# Patient Record
Sex: Female | Born: 1970 | Race: Black or African American | Hispanic: No | Marital: Single | Smoking: Never smoker
Health system: Southern US, Community
[De-identification: ages and names within clinical notes are randomized; demographics above are authoritative.]

## PROBLEM LIST (undated history)

## (undated) DIAGNOSIS — N179 Acute kidney failure, unspecified: Secondary | ICD-10-CM

## (undated) DIAGNOSIS — T7840XA Allergy, unspecified, initial encounter: Secondary | ICD-10-CM

## (undated) DIAGNOSIS — F329 Major depressive disorder, single episode, unspecified: Secondary | ICD-10-CM

## (undated) DIAGNOSIS — F32A Depression, unspecified: Secondary | ICD-10-CM

## (undated) HISTORY — PX: TUBAL LIGATION: SHX77

## (undated) HISTORY — DX: Allergy, unspecified, initial encounter: T78.40XA

## (undated) HISTORY — DX: Depression, unspecified: F32.A

## (undated) HISTORY — DX: Major depressive disorder, single episode, unspecified: F32.9

---

## 1997-06-21 ENCOUNTER — Other Ambulatory Visit: Admission: RE | Admit: 1997-06-21 | Discharge: 1997-06-21 | Payer: Self-pay | Admitting: Obstetrics

## 1997-06-24 ENCOUNTER — Ambulatory Visit (HOSPITAL_COMMUNITY): Admission: RE | Admit: 1997-06-24 | Discharge: 1997-06-24 | Payer: Self-pay | Admitting: Obstetrics

## 1997-08-02 ENCOUNTER — Inpatient Hospital Stay (HOSPITAL_COMMUNITY): Admission: AD | Admit: 1997-08-02 | Discharge: 1997-08-05 | Payer: Self-pay | Admitting: Obstetrics

## 1997-08-07 ENCOUNTER — Inpatient Hospital Stay (HOSPITAL_COMMUNITY): Admission: AD | Admit: 1997-08-07 | Discharge: 1997-08-07 | Payer: Self-pay | Admitting: Obstetrics

## 1997-10-15 ENCOUNTER — Emergency Department (HOSPITAL_COMMUNITY): Admission: EM | Admit: 1997-10-15 | Discharge: 1997-10-15 | Payer: Self-pay | Admitting: Emergency Medicine

## 1997-10-15 ENCOUNTER — Encounter: Payer: Self-pay | Admitting: Emergency Medicine

## 1997-11-21 ENCOUNTER — Emergency Department (HOSPITAL_COMMUNITY): Admission: EM | Admit: 1997-11-21 | Discharge: 1997-11-21 | Payer: Self-pay | Admitting: *Deleted

## 1998-11-02 ENCOUNTER — Emergency Department (HOSPITAL_COMMUNITY): Admission: EM | Admit: 1998-11-02 | Discharge: 1998-11-02 | Payer: Self-pay | Admitting: Emergency Medicine

## 2000-12-19 ENCOUNTER — Emergency Department (HOSPITAL_COMMUNITY): Admission: EM | Admit: 2000-12-19 | Discharge: 2000-12-19 | Payer: Self-pay | Admitting: Emergency Medicine

## 2001-02-03 ENCOUNTER — Other Ambulatory Visit: Admission: RE | Admit: 2001-02-03 | Discharge: 2001-02-03 | Payer: Self-pay | Admitting: Obstetrics and Gynecology

## 2001-05-01 ENCOUNTER — Emergency Department (HOSPITAL_COMMUNITY): Admission: EM | Admit: 2001-05-01 | Discharge: 2001-05-01 | Payer: Self-pay | Admitting: Emergency Medicine

## 2001-05-03 ENCOUNTER — Emergency Department (HOSPITAL_COMMUNITY): Admission: EM | Admit: 2001-05-03 | Discharge: 2001-05-03 | Payer: Self-pay | Admitting: Emergency Medicine

## 2002-01-06 ENCOUNTER — Inpatient Hospital Stay (HOSPITAL_COMMUNITY): Admission: EM | Admit: 2002-01-06 | Discharge: 2002-01-09 | Payer: Self-pay | Admitting: Emergency Medicine

## 2002-01-06 ENCOUNTER — Emergency Department (HOSPITAL_COMMUNITY): Admission: EM | Admit: 2002-01-06 | Discharge: 2002-01-06 | Payer: Self-pay | Admitting: Emergency Medicine

## 2002-06-08 ENCOUNTER — Emergency Department (HOSPITAL_COMMUNITY): Admission: EM | Admit: 2002-06-08 | Discharge: 2002-06-08 | Payer: Self-pay | Admitting: *Deleted

## 2002-10-15 ENCOUNTER — Other Ambulatory Visit: Admission: RE | Admit: 2002-10-15 | Discharge: 2002-10-15 | Payer: Self-pay | Admitting: Obstetrics and Gynecology

## 2003-01-16 ENCOUNTER — Emergency Department (HOSPITAL_COMMUNITY): Admission: EM | Admit: 2003-01-16 | Discharge: 2003-01-16 | Payer: Self-pay | Admitting: Emergency Medicine

## 2003-05-04 ENCOUNTER — Emergency Department (HOSPITAL_COMMUNITY): Admission: EM | Admit: 2003-05-04 | Discharge: 2003-05-04 | Payer: Self-pay | Admitting: Emergency Medicine

## 2003-05-26 ENCOUNTER — Emergency Department (HOSPITAL_COMMUNITY): Admission: EM | Admit: 2003-05-26 | Discharge: 2003-05-26 | Payer: Self-pay | Admitting: Emergency Medicine

## 2003-05-29 ENCOUNTER — Emergency Department (HOSPITAL_COMMUNITY): Admission: EM | Admit: 2003-05-29 | Discharge: 2003-05-29 | Payer: Self-pay | Admitting: Emergency Medicine

## 2003-10-11 ENCOUNTER — Emergency Department (HOSPITAL_COMMUNITY): Admission: EM | Admit: 2003-10-11 | Discharge: 2003-10-11 | Payer: Self-pay | Admitting: Emergency Medicine

## 2004-04-19 ENCOUNTER — Emergency Department (HOSPITAL_COMMUNITY): Admission: EM | Admit: 2004-04-19 | Discharge: 2004-04-19 | Payer: Self-pay | Admitting: Emergency Medicine

## 2004-09-29 ENCOUNTER — Emergency Department (HOSPITAL_COMMUNITY): Admission: EM | Admit: 2004-09-29 | Discharge: 2004-09-29 | Payer: Self-pay | Admitting: Emergency Medicine

## 2004-11-17 ENCOUNTER — Emergency Department (HOSPITAL_COMMUNITY): Admission: EM | Admit: 2004-11-17 | Discharge: 2004-11-17 | Payer: Self-pay | Admitting: Emergency Medicine

## 2005-09-07 ENCOUNTER — Emergency Department (HOSPITAL_COMMUNITY): Admission: EM | Admit: 2005-09-07 | Discharge: 2005-09-07 | Payer: Self-pay | Admitting: Emergency Medicine

## 2005-09-20 ENCOUNTER — Emergency Department (HOSPITAL_COMMUNITY): Admission: EM | Admit: 2005-09-20 | Discharge: 2005-09-20 | Payer: Self-pay | Admitting: Family Medicine

## 2006-01-03 ENCOUNTER — Inpatient Hospital Stay (HOSPITAL_COMMUNITY): Admission: EM | Admit: 2006-01-03 | Discharge: 2006-01-05 | Payer: Self-pay | Admitting: Emergency Medicine

## 2006-09-12 ENCOUNTER — Emergency Department (HOSPITAL_COMMUNITY): Admission: EM | Admit: 2006-09-12 | Discharge: 2006-09-12 | Payer: Self-pay | Admitting: Emergency Medicine

## 2007-02-20 ENCOUNTER — Emergency Department (HOSPITAL_COMMUNITY): Admission: EM | Admit: 2007-02-20 | Discharge: 2007-02-20 | Payer: Self-pay | Admitting: Emergency Medicine

## 2007-02-22 ENCOUNTER — Emergency Department (HOSPITAL_COMMUNITY): Admission: EM | Admit: 2007-02-22 | Discharge: 2007-02-22 | Payer: Self-pay | Admitting: Emergency Medicine

## 2007-02-22 ENCOUNTER — Inpatient Hospital Stay (HOSPITAL_COMMUNITY): Admission: AD | Admit: 2007-02-22 | Discharge: 2007-02-26 | Payer: Self-pay | Admitting: Psychiatry

## 2007-02-24 ENCOUNTER — Ambulatory Visit: Payer: Self-pay | Admitting: Psychiatry

## 2007-03-18 ENCOUNTER — Emergency Department (HOSPITAL_COMMUNITY): Admission: EM | Admit: 2007-03-18 | Discharge: 2007-03-18 | Payer: Self-pay | Admitting: Family Medicine

## 2007-06-14 ENCOUNTER — Emergency Department (HOSPITAL_COMMUNITY): Admission: EM | Admit: 2007-06-14 | Discharge: 2007-06-14 | Payer: Self-pay | Admitting: Emergency Medicine

## 2007-08-17 ENCOUNTER — Emergency Department (HOSPITAL_COMMUNITY): Admission: EM | Admit: 2007-08-17 | Discharge: 2007-08-17 | Payer: Self-pay | Admitting: Emergency Medicine

## 2007-08-20 ENCOUNTER — Emergency Department (HOSPITAL_COMMUNITY): Admission: EM | Admit: 2007-08-20 | Discharge: 2007-08-20 | Payer: Self-pay | Admitting: Emergency Medicine

## 2007-11-05 ENCOUNTER — Emergency Department (HOSPITAL_COMMUNITY): Admission: EM | Admit: 2007-11-05 | Discharge: 2007-11-05 | Payer: Self-pay | Admitting: Emergency Medicine

## 2008-01-13 ENCOUNTER — Ambulatory Visit: Payer: Self-pay | Admitting: Obstetrics and Gynecology

## 2008-01-13 ENCOUNTER — Encounter: Payer: Self-pay | Admitting: Obstetrics and Gynecology

## 2008-01-13 LAB — CONVERTED CEMR LAB: Trich, Wet Prep: NONE SEEN

## 2008-08-08 ENCOUNTER — Emergency Department (HOSPITAL_COMMUNITY): Admission: EM | Admit: 2008-08-08 | Discharge: 2008-08-08 | Payer: Self-pay | Admitting: Emergency Medicine

## 2008-09-20 ENCOUNTER — Emergency Department (HOSPITAL_COMMUNITY): Admission: EM | Admit: 2008-09-20 | Discharge: 2008-09-20 | Payer: Self-pay | Admitting: Family Medicine

## 2008-11-14 ENCOUNTER — Emergency Department (HOSPITAL_COMMUNITY): Admission: EM | Admit: 2008-11-14 | Discharge: 2008-11-14 | Payer: Self-pay | Admitting: Emergency Medicine

## 2009-03-09 ENCOUNTER — Emergency Department (HOSPITAL_COMMUNITY): Admission: EM | Admit: 2009-03-09 | Discharge: 2009-03-09 | Payer: Self-pay | Admitting: Family Medicine

## 2009-07-26 ENCOUNTER — Observation Stay (HOSPITAL_COMMUNITY): Admission: EM | Admit: 2009-07-26 | Discharge: 2009-07-26 | Payer: Self-pay | Admitting: Emergency Medicine

## 2009-11-09 ENCOUNTER — Inpatient Hospital Stay (HOSPITAL_COMMUNITY): Admission: EM | Admit: 2009-11-09 | Discharge: 2009-11-12 | Payer: Self-pay | Admitting: Emergency Medicine

## 2009-11-10 ENCOUNTER — Ambulatory Visit: Payer: Self-pay | Admitting: Infectious Diseases

## 2009-11-22 ENCOUNTER — Emergency Department (HOSPITAL_COMMUNITY): Admission: EM | Admit: 2009-11-22 | Discharge: 2009-11-22 | Payer: Self-pay | Admitting: Emergency Medicine

## 2009-12-01 ENCOUNTER — Encounter (INDEPENDENT_AMBULATORY_CARE_PROVIDER_SITE_OTHER): Payer: Self-pay | Admitting: *Deleted

## 2009-12-01 LAB — CONVERTED CEMR LAB
AST: 44 units/L — ABNORMAL HIGH (ref 0–37)
Albumin: 3.7 g/dL (ref 3.5–5.2)
BUN: 9 mg/dL (ref 6–23)
Calcium: 8.9 mg/dL (ref 8.4–10.5)
Creatinine, Ser: 0.55 mg/dL (ref 0.40–1.20)
Glucose, Bld: 140 mg/dL — ABNORMAL HIGH (ref 70–99)
Sodium: 141 meq/L (ref 135–145)

## 2010-03-11 ENCOUNTER — Emergency Department (HOSPITAL_COMMUNITY)
Admission: EM | Admit: 2010-03-11 | Discharge: 2010-03-11 | Disposition: A | Discharge status: Home / Self Care | Payer: Self-pay | Attending: Emergency Medicine | Admitting: Emergency Medicine

## 2010-03-11 DIAGNOSIS — E119 Type 2 diabetes mellitus without complications: Secondary | ICD-10-CM | POA: Insufficient documentation

## 2010-03-11 DIAGNOSIS — R040 Epistaxis: Secondary | ICD-10-CM | POA: Insufficient documentation

## 2010-03-11 DIAGNOSIS — Z794 Long term (current) use of insulin: Secondary | ICD-10-CM | POA: Insufficient documentation

## 2010-04-19 LAB — DIFFERENTIAL
Basophils Relative: 0 % (ref 0–1)
Eosinophils Absolute: 0 10*3/uL (ref 0.0–0.7)
Eosinophils Relative: 1 % (ref 0–5)
Eosinophils Relative: 1 % (ref 0–5)
Lymphocytes Relative: 20 % (ref 12–46)
Lymphocytes Relative: 21 % (ref 12–46)
Lymphs Abs: 1.2 10*3/uL (ref 0.7–4.0)
Lymphs Abs: 1.3 10*3/uL (ref 0.7–4.0)
Lymphs Abs: 1.4 10*3/uL (ref 0.7–4.0)
Lymphs Abs: 1.6 10*3/uL (ref 0.7–4.0)
Monocytes Absolute: 0.6 10*3/uL (ref 0.1–1.0)
Monocytes Relative: 8 % (ref 3–12)
Monocytes Relative: 9 % (ref 3–12)
Neutro Abs: 4 10*3/uL (ref 1.7–7.7)
Neutrophils Relative %: 69 % (ref 43–77)
Neutrophils Relative %: 71 % (ref 43–77)

## 2010-04-19 LAB — CBC
HCT: 33.3 % — ABNORMAL LOW (ref 36.0–46.0)
Hemoglobin: 11.3 g/dL — ABNORMAL LOW (ref 12.0–15.0)
Hemoglobin: 13.9 g/dL (ref 12.0–15.0)
MCH: 29.9 pg (ref 26.0–34.0)
MCH: 30.3 pg (ref 26.0–34.0)
MCHC: 34.3 g/dL (ref 30.0–36.0)
MCHC: 34.3 g/dL (ref 30.0–36.0)
MCHC: 34.7 g/dL (ref 30.0–36.0)
MCV: 87.5 fL (ref 78.0–100.0)
Platelets: 197 10*3/uL (ref 150–400)
Platelets: 240 10*3/uL (ref 150–400)
RBC: 3.91 MIL/uL (ref 3.87–5.11)
RBC: 4.59 MIL/uL (ref 3.87–5.11)
RDW: 12.6 % (ref 11.5–15.5)
RDW: 12.7 % (ref 11.5–15.5)
WBC: 10.3 10*3/uL (ref 4.0–10.5)

## 2010-04-19 LAB — GLUCOSE, CAPILLARY
Glucose-Capillary: 169 mg/dL — ABNORMAL HIGH (ref 70–99)
Glucose-Capillary: 198 mg/dL — ABNORMAL HIGH (ref 70–99)
Glucose-Capillary: 216 mg/dL — ABNORMAL HIGH (ref 70–99)
Glucose-Capillary: 218 mg/dL — ABNORMAL HIGH (ref 70–99)
Glucose-Capillary: 226 mg/dL — ABNORMAL HIGH (ref 70–99)
Glucose-Capillary: 228 mg/dL — ABNORMAL HIGH (ref 70–99)
Glucose-Capillary: 255 mg/dL — ABNORMAL HIGH (ref 70–99)
Glucose-Capillary: 278 mg/dL — ABNORMAL HIGH (ref 70–99)

## 2010-04-19 LAB — BASIC METABOLIC PANEL
Chloride: 99 mEq/L (ref 96–112)
Creatinine, Ser: 0.45 mg/dL (ref 0.4–1.2)
GFR calc non Af Amer: >60 mL/min (ref >60)
Glucose, Bld: 267 mg/dL — ABNORMAL HIGH (ref 70–99)
Sodium: 132 mEq/L — ABNORMAL LOW (ref 135–145)

## 2010-04-19 LAB — COMPREHENSIVE METABOLIC PANEL
ALT: 28 U/L (ref 0–35)
AST: 41 U/L — ABNORMAL HIGH (ref 0–37)
AST: 42 U/L — ABNORMAL HIGH (ref 0–37)
Albumin: 2.6 g/dL — ABNORMAL LOW (ref 3.5–5.2)
Alkaline Phosphatase: 239 U/L — ABNORMAL HIGH (ref 39–117)
BUN: 6 mg/dL (ref 6–23)
CO2: 25 mEq/L (ref 19–32)
Calcium: 8.5 mg/dL (ref 8.4–10.5)
Calcium: 8.6 mg/dL (ref 8.4–10.5)
Calcium: 8.6 mg/dL (ref 8.4–10.5)
Creatinine, Ser: 0.49 mg/dL (ref 0.4–1.2)
Creatinine, Ser: 0.54 mg/dL (ref 0.4–1.2)
GFR calc Af Amer: >60 mL/min (ref >60)
GFR calc Af Amer: >60 mL/min (ref >60)
GFR calc non Af Amer: >60 mL/min (ref >60)
GFR calc non Af Amer: >60 mL/min (ref >60)
Glucose, Bld: 198 mg/dL — ABNORMAL HIGH (ref 70–99)
Glucose, Bld: 218 mg/dL — ABNORMAL HIGH (ref 70–99)
Sodium: 134 mEq/L — ABNORMAL LOW (ref 135–145)
Total Protein: 6.4 g/dL (ref 6.0–8.3)

## 2010-04-19 LAB — CULTURE, BLOOD (ROUTINE X 2)
Culture  Setup Time: 201110061406
Culture  Setup Time: 201110061406
Culture: NO GROWTH

## 2010-04-19 LAB — WOUND CULTURE

## 2010-04-19 LAB — URINE MICROSCOPIC-ADD ON

## 2010-04-19 LAB — URINALYSIS, ROUTINE W REFLEX MICROSCOPIC
Bilirubin Urine: NEGATIVE
Nitrite: NEGATIVE
Protein, ur: NEGATIVE mg/dL
Specific Gravity, Urine: 1.035 — ABNORMAL HIGH (ref 1.005–1.030)
Urobilinogen, UA: 2 mg/dL — ABNORMAL HIGH (ref 0.0–1.0)
pH: 6.5 (ref 5.0–8.0)

## 2010-04-19 LAB — PHOSPHORUS
Phosphorus: 3.4 mg/dL (ref 2.3–4.6)
Phosphorus: 3.9 mg/dL (ref 2.3–4.6)

## 2010-04-19 LAB — MAGNESIUM
Magnesium: 1.7 mg/dL (ref 1.5–2.5)
Magnesium: 1.8 mg/dL (ref 1.5–2.5)

## 2010-04-22 LAB — POCT I-STAT 3, VENOUS BLOOD GAS (G3P V)
Acid-Base Excess: 1 mmol/L (ref 0.0–2.0)
Bicarbonate: 25.6 mEq/L — ABNORMAL HIGH (ref 20.0–24.0)
pH, Ven: 7.405 — ABNORMAL HIGH (ref 7.250–7.300)
pO2, Ven: 69 mmHg — ABNORMAL HIGH (ref 30.0–45.0)

## 2010-04-22 LAB — DIFFERENTIAL
Basophils Absolute: 0 10*3/uL (ref 0.0–0.1)
Basophils Relative: 1 % (ref 0–1)
Lymphocytes Relative: 33 % (ref 12–46)
Monocytes Absolute: 0.3 10*3/uL (ref 0.1–1.0)
Neutro Abs: 2.7 10*3/uL (ref 1.7–7.7)

## 2010-04-22 LAB — URINE MICROSCOPIC-ADD ON

## 2010-04-22 LAB — CBC
MCHC: 34.2 g/dL (ref 30.0–36.0)
Platelets: 210 10*3/uL (ref 150–400)
RDW: 12.8 % (ref 11.5–15.5)
WBC: 4.8 10*3/uL (ref 4.0–10.5)

## 2010-04-22 LAB — BASIC METABOLIC PANEL
BUN: 5 mg/dL — ABNORMAL LOW (ref 6–23)
Calcium: 8.9 mg/dL (ref 8.4–10.5)
Creatinine, Ser: 0.48 mg/dL (ref 0.4–1.2)
GFR calc non Af Amer: >60 mL/min (ref >60)
Glucose, Bld: 480 mg/dL — ABNORMAL HIGH (ref 70–99)

## 2010-04-22 LAB — URINALYSIS, ROUTINE W REFLEX MICROSCOPIC
Glucose, UA: >1000 mg/dL — AB
Hgb urine dipstick: NEGATIVE
Leukocytes, UA: NEGATIVE
Protein, ur: NEGATIVE mg/dL
Specific Gravity, Urine: 1.01 (ref 1.005–1.030)
pH: 6 (ref 5.0–8.0)

## 2010-04-22 LAB — POCT I-STAT, CHEM 8
BUN: 3 mg/dL — ABNORMAL LOW (ref 6–23)
Creatinine, Ser: 0.5 mg/dL (ref 0.4–1.2)
Hemoglobin: 12.6 g/dL (ref 12.0–15.0)
Potassium: 3.6 mEq/L (ref 3.5–5.1)
Sodium: 135 mEq/L (ref 135–145)
TCO2: 24 mmol/L (ref 0–100)

## 2010-04-22 LAB — GLUCOSE, CAPILLARY
Glucose-Capillary: 199 mg/dL — ABNORMAL HIGH (ref 70–99)
Glucose-Capillary: 276 mg/dL — ABNORMAL HIGH (ref 70–99)
Glucose-Capillary: 340 mg/dL — ABNORMAL HIGH (ref 70–99)

## 2010-04-22 LAB — KETONES, QUALITATIVE: Acetone, Bld: NEGATIVE

## 2010-05-10 LAB — DIFFERENTIAL
Basophils Absolute: 0 10*3/uL (ref 0.0–0.1)
Basophils Relative: 0 % (ref 0–1)
Eosinophils Absolute: 0.1 10*3/uL (ref 0.0–0.7)
Monocytes Relative: 6 % (ref 3–12)
Neutro Abs: 3.9 10*3/uL (ref 1.7–7.7)
Neutrophils Relative %: 64 % (ref 43–77)

## 2010-05-10 LAB — CBC
MCHC: 35 g/dL (ref 30.0–36.0)
MCV: 86.8 fL (ref 78.0–100.0)
Platelets: 237 10*3/uL (ref 150–400)
RDW: 13.2 % (ref 11.5–15.5)

## 2010-05-10 LAB — BASIC METABOLIC PANEL
BUN: 11 mg/dL (ref 6–23)
CO2: 26 mEq/L (ref 19–32)
Calcium: 8.9 mg/dL (ref 8.4–10.5)
Chloride: 100 mEq/L (ref 96–112)
Creatinine, Ser: 0.61 mg/dL (ref 0.4–1.2)
GFR calc Af Amer: >60 mL/min (ref >60)

## 2010-05-10 LAB — GLUCOSE, CAPILLARY
Glucose-Capillary: 198 mg/dL — ABNORMAL HIGH (ref 70–99)
Glucose-Capillary: 415 mg/dL — ABNORMAL HIGH (ref 70–99)

## 2010-05-12 LAB — GLUCOSE, CAPILLARY
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
## 2010-06-19 NOTE — Group Therapy Note (Signed)
NAME:  [name], [name] NO.:  [id number]   MEDICAL RECORD NO.:  [id number]          PATIENT TYPE:  WOC   LOCATION:  [facility]                   FACILITY:  [facility]   PHYSICIAN:  Argentina Donovan, MD        DATE OF BIRTH:  05-06-1970   DATE OF SERVICE:                                  CLINIC NOTE   The patient is a 40 year old African American female gravida 6, para 3-0-  3-3, the youngest child 66 years old, has not been sexually active in 6  years, is an insulin-dependent diabetic and has been cared for by a  family doctor who she has an appointment with this next week.  The  patient is 5 feet 6 inches tall.  She weighs 339 pounds.  Her blood  pressure 133/80.  Pulse 74 per minute.  In for annual examination and  has no significant complaints except infrequent yeast infections.   PHYSICAL EXAMINATION:  Thyroid is symmetrical.  No dominant masses.  NECK:  Supple.  BREASTS:  Are extraordinarily large but smooth and no dominant masses  noted.  No nipple discharge.  ABDOMEN:  Soft, flat, nontender.  No  masses or organomegaly.  EXTERNAL GENITALIA:  Is normal.  BUS within normal limits.  Vagina is  clean and well-rugated.  The cervix clean and parous and Pap smear and  wet prep were taken.  The uterus and adnexa could not be palpated  because of habitus of the patient.   IMPRESSION:  Normal gynecological examination.           ______________________________  Argentina Donovan, MD     PR/MEDQ  D:  01/13/2008  T:  01/13/2008  Job:  [id number]

## 2010-06-22 NOTE — Discharge Summary (Signed)
[name], [name]            ACCOUNT NO.:  [id number]   MEDICAL RECORD NO.:  [id number]          PATIENT TYPE:  INP   LOCATION:  5503                         FACILITY:  [facility]   PHYSICIAN:  Hillery Aldo, M.D.   DATE OF BIRTH:  1970/12/05   DATE OF ADMISSION:  01/03/2006  DATE OF DISCHARGE:  01/05/2006                               DISCHARGE SUMMARY   PRIMARY CARE PHYSICIAN:  Dr. Lovell Sheehan.   DISCHARGE DIAGNOSES:  1. Noncardiac chest pain.  2. Uncontrolled diabetes.  3. Leukopenia.  4. Depression.  5. Gastroesophageal reflux disease.   DISCHARGE MEDICATIONS:  1. Aspirin 81 mg daily.  2. Lexapro 10 mg daily.  3. Protonix 40 mg daily.  4. Lantus insulin 20 units q.12 h.  5. NovoLog insulin 6 units with every meal along with sliding scale      insulin as directed.   CONSULTATIONS:  None.   PROCEDURES AND DIAGNOSTIC STUDIES:  1. Chest x-ray, on January 03, 2006, showed low-volume exam with      vascular congestion and bibasilar atelectasis.  There is mild      stable cardiac enlargement.  No acute air space process.  2. CT scan of the head on January 03, 2006, showed no acute      intracranial abnormalities.   DISCHARGE LABORATORY VALUES:  Sodium was 139, potassium 3.6, chloride  108, bicarb 27, BUN 8, creatinine 0.6, glucose 132.  White blood cell  count was 4.1, hemoglobin 12.2, hematocrit 35.7, platelets 202.   HOSPITAL COURSE BY PROBLEM:  1. Noncardiac chest pain.  The patient was initially admitted with      complaints of chest pain.  Cardiac enzymes were cycled and were      negative x3 sets.  The patient is a menstruating young female      making the possibility of coronary disease extremely unlikely.  Her      chest pain was therefore thought to be secondary to      gastroesophageal reflux disease and she was put on proton pump      inhibitor therapy.  Incidentally, her pain did get relieved when      given a GI cocktail.  No further diagnostic workup was  deemed      necessary.  She was monitored on telemetry and her 12-lead EKG was      unremarkable.  2. Uncontrolled diabetes.  The patient was put on high dose insulin      therapy.  Additionally, she was set up for outpatient diabetes      education.  Her hemoglobin A1c was found to be 12.3%.  She should      follow up with her primary care physician for ongoing diabetes      surveillance.  3. Leukopenia.  The patient's leukopenia was mild and corrected itself      without any specific intervention or workup.  4. Depression.  The patient was maintained on her usual dose of      Lexapro.   DISPOSITION:  The patient was stable for discharge home and was set up  for outpatient diabetes education prior  to discharge.      Hillery Aldo, M.D.  Electronically Signed     CR/MEDQ  D:  02/11/2006  T:  02/11/2006  Job:  [id number]   cc:   Lovell Sheehan, Dr.

## 2010-06-22 NOTE — H&P (Signed)
NAMEYSELA, HETTINGER NO.:  [id number]   MEDICAL RECORD NO.:  [id number]          PATIENT TYPE:  INP   LOCATION:  1826                         FACILITY:  [facility]   PHYSICIAN:  Marcellus Scott, MD     DATE OF BIRTH:  [birth year]   DATE OF ADMISSION:  01/03/2006  DATE OF DISCHARGE:                              HISTORY & PHYSICAL   PRIMARY CARE PHYSICIAN:  Della Goo, M.D.   CHIEF COMPLAINT:  Headache, chest pain, generalized weakness and  dyspepsia.   HISTORY OF PRESENT ILLNESS:  Ms. Sidman is a pleasant 40 year old  African-American female patient with history of diabetes that was  diagnosed approximately 4 years ago. She was initially on insulin, when  she was said to be well controlled, following which she was switched to  Avandia and Metformin. However, patient kept forgetting to take the  medications. She was subsequently switched back to Lantus insulin 10  units twice daily and a sliding scale NovoLog insulin in April 07, and  the patient is still on the same medications at the same dose. The  patient says her glycemic control was pretty okay until October 2007,  with sugars in the range of 120 mg/dL on Glucometer testing at home  which she does 4 times a day. However, since November, when she traveled  to [place], for her birthday,for 2 weeks and returned, they have been  uncontrolled. She also claims that she has not been compliant with her  diet, and since her return, she has had blood sugars in the range of  anywhere between 450 to 550 range. She still continues with Lantus  insulin at the same dose and claims she uses approximately a total of 7  to 8 units of NovoLog insulin for the day. She has history of polyuria,  polydipsia. There is no pruritus or visual disturbance. Last night, she  had some epigastric burning pain which radiated to her retrosternal area  about 3/10 in severity for which she took a dose of Maalox with prompt  relief of  the pain. This morning, the patient woke up with a headache  and feeling generally weak. Also had similar complaint of pain, 3/10,  retrosternal, burning in nature with no radiation, no relation to  position with no associated palpitations or dizziness or lightheadedness  or diaphoresis. The pain is apparently worse after meals. The patient  also had some dyspnea. Denied any cough, wheezing, chest tightness or  palpitations following which she presented to the emergency room.   PAST MEDICAL HISTORY:  1. Type 2 diabetes.  2. Morbid obesity.   The patient denies history of hypertension, dyslipidemia, MI or coronary  artery disease, COPD or asthma.   PAST SURGICAL HISTORY:  Status post tubal ligation.   MEDICATIONS:  1. Lantus insulin 10 units subcutaneously twice daily.  2. NovoLog sliding scale insulin.  3. Lexapro 10 mg p.o. every night.   FAMILY HISTORY:  The patient's mom demised at the age of 38 years of  brain tumor perioperatively, when she sustained a stroke. Her daughter  aged 20 years has juvenile diabetes for the  last 10 years.   SOCIAL HISTORY:  The patient is a nonsmoker. No alcohol intake or drug  abuse. She works as a Lawyer at [employer]. She had her last  menstrual period about two weeks ago, and she is not sexually active.   REVIEW OF SYSTEMS:  HEENT:  The patient has no further headaches at this  time. She denies any visual disturbances, earaches, sore throat,  difficulty in swallowing, tinnitus, lightheadedness or dizziness.  RESPIRATORY SYSTEM:  The patient denies any further dyspnea, wheezing,  tightness of the chest or cough. CARDIOVASCULAR SYSTEM:  The patient  denies any further chest pain since this morning. No palpitations. No  orthopnea. No PND. ABDOMINAL SYSTEM:  The patient denies nausea,  vomiting, constipation, diarrhea or abdominal pain. GENITOURINARY:  Polyuria but without any urgency, dysuria. MUSCULOSKELETAL:  Occasional  aches and  pains but no joint swelling or joint pains at this time. No  muscle aches. SKIN:  No rashes. CENTRAL NERVOUS SYSTEM:  The patient  denies any asymmetrical limb weakness or numbness or tingling.  PSYCHIATRIC:  No delusions, hallucinations, suicidal or homicidal  ideations. Patient snores while asleep, but says wakes up well rested  with no daytime tiredness or sleepiness.   PHYSICAL EXAMINATION:  Ms. Broers is a young, morbidly obese female  patient in no obvious cardiopulmonary or painful distress, lying supine  in bed.  VITAL SIGNS:  Temperature 97.5, pulse 50 per minute, respirations of 16  per minute, blood pressure of 116/47, saturating at 100% on room air.  Weight is 341 pounds.  HEENT:  Head is normocephalic, atraumatic. Pupils are bilaterally round,  symmetrical, equally  reactive to light and accommodation. Extraocular  muscle movements are intact. Mucosa pink and mildly dry. Anicteric. Oral  cavity with multiple missing teeth. Also 1 to 2 carious teeth. There is  no pharyngeal erythema.  NECK EXAMINATION:  Thick without any JVD or lymph nodes or carotid  bruit. Difficult to appreciate goiter.  RESPIRATORY EXAMINATION:  Normal vesicular breath sounds heard  bilaterally and clear to auscultation.  CARDIOVASCULAR SYSTEM:  First and second heart sounds are heard. No  third or fourth heart sounds. No murmurs, rubs, gallops or clicks.  ABDOMINAL EXAM:  Obese but nontender. Soft. No organomegaly or mass  appreciable. Bowel sounds heard normally.  EXTREMITIES:  There is no clubbing, cyanosis, or edema. Peripheral  pulses are symmetrically felt.  MUSCULOSKELETAL SYSTEM:  Without any tenderness or abnormality.  SKIN:  Without any rashes.  BREAST EXAMINATION:  Not done as it is not pertinent to the current  presentation.  CNS: Awake, alert and oriented. No focal deficits.  LABORATORY DATA:  Venous ABG with a pH of 7.372, total carbon dioxide of  14.4 and bicarb of 23.4. H and H  of 13.9 and 41. Basic metabolic panel:  Sodium 132, potassium 3.5, chloride 101, bicarb 23, BUN 8, creatinine  0.6, glucose 439. Blood acetone negative. Hepatic panel with total  protein of 6.6, albumin 3, AST 28, ALT 24, total bilirubin 0.7, direct  bilirubin less than 0.1, alkaline phosphatase 97. CK-MB, troponin I and  myoglobin x2 negative. Urinalysis with greater than 1000 glucose but  otherwise negative.   Chest x-ray:  1. Low volume exam with vascular congestion and bilateral atelectasis.  2. Mild stable cardiac enlargement.  3. No acute airspace disease.   CT head which was negative. EKG has normal sinus rhythm at 63 beats per  minute with Q waves in V1 and V2.  ASSESSMENT AND PLAN:  1. Uncontrolled diabetes. Will admit patient to telemetry. Will obtain      hemoglobin A1c. Will monitor patient's CBGs serially. Will increase      the patient's dose of Lantus insulin to 15 units twice daily. Will      place the patient on a resistant sliding scale insulin. Her Lantus      insulin might need further adjustments. Also, she might need      mealtime coverage of insulin based on how she responds to above      regimen. Will also place her on IV fluid regimen with potassium      supplements, for 24 hours. Will obtain diabetic instruction      education, diet counseling, as well as outpatient diabetic      education for the patient. Will also check her fasting lipid panel.      She has been councilled regarding compliance with diet,      medications, physcian visits. She has also been adviced regarding      exercise and weight loss.  2. Obesity. Will obtain a dietary education. The patient has been      counseled regarding weight loss and diet and exercise. This has to      be followed up as an outpatient.  3. Chest pain, which seems more gastric in nature based on her      symptomatology, and so far the workup has been negative for an      acute coronary syndrome. However, will  place her on the telemetry      unit. Will cycle the cardiac enzymes. Will follow up on the D-dimer      results. Will place her on Protonix for possible gastroesophageal      reflux disease.  4. For her deep venous thrombosis prophylaxis, we will place the      patient on low-molecular weight heparin, weight adjusted by      pharmacy.  5. For gastrointestinal prophylaxis, will place patient on Protonix.  6. Will obtain a TSH level.      Marcellus Scott, MD  Electronically Signed     AH/MEDQ  D:  01/03/2006  T:  01/03/2006  Job:  [id number]   cc:   Della Goo, M.D.
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
## 2010-06-22 NOTE — Discharge Summary (Signed)
   [name], [name]                        ACCOUNT NO.:  [id number]   MEDICAL RECORD NO.:  [id number]                   PATIENT TYPE:  INP   LOCATION:  5506                                 FACILITY:  [facility]   PHYSICIAN:  Della Goo, M.D.              DATE OF BIRTH:  [birth year]   DATE OF ADMISSION:  01/06/2002  DATE OF DISCHARGE:  01/09/2002                                 DISCHARGE SUMMARY   FINAL DIAGNOSES:  1. New onset of type 2 diabetes mellitus.  2. Morbid obesity.  3. Upper respiratory infection/bacterial sinusitis.   HISTORY OF PRESENT ILLNESS:  This is a 40 year old African American female  presenting to the emergency department with symptoms of polyuria and  polydipsia x 2 weeks.  She was evaluated and found to have hyperglycemia and  was started on treatment.  Discharged to home initially, but the patient  returned secondary to worsening symptoms and was referred for admission.   HOSPITAL COURSE:  On admission, the patient was found to have a blood sugar  of 651.  She was treated with IV fluids for fluid resuscitation and placed  on an insulin drip.  Her blood sugars responded well.  Overnight the insulin  drip was discontinued and the patient was started on oral therapies for type  2 diabetes.  The patient was not found to have diabetic ketoacidosis, but  instead had hyperosmolar ketosis.  The patient was started on a diabetic  diet, 1800 calories, and the nutritionist was consulted for diabetic  teaching.  The patient's blood sugars overall improved.   DISPOSITION:  The patient was discharged to home on January 09, 2002, in  stable condition with improved blood glucose levels.   DISCHARGE MEDICATIONS:  1. Glucophage XR 500 mg two tablets p.o. q.a.m.  2. Avandia 8 mg one p.o. q.a.m.   SPECIAL INSTRUCTIONS:  The patient had instructions to perform b.i.d. CBG  checks before breakfast and before dinner.  The patient was also advised  about diet and  exercise.   FOLLOW-UP:  The patient was to follow up in the office in five to seven  days.                                               Della Goo, M.D.    HJ/MEDQ  D:  03/30/2002  T:  03/31/2002  Job:  [id number]

## 2010-10-26 LAB — URINALYSIS, ROUTINE W REFLEX MICROSCOPIC
Bilirubin Urine: NEGATIVE
Glucose, UA: NEGATIVE
Hgb urine dipstick: NEGATIVE
Ketones, ur: 40 — AB
Nitrite: NEGATIVE
Protein, ur: NEGATIVE
Specific Gravity, Urine: 1.017
Urobilinogen, UA: 0.2
pH: 5.5

## 2010-10-26 LAB — DIFFERENTIAL
Basophils Absolute: 0
Basophils Relative: 1
Eosinophils Absolute: 0
Monocytes Relative: 6
Neutro Abs: 3.8
Neutrophils Relative %: 62

## 2010-10-26 LAB — RAPID URINE DRUG SCREEN, HOSP PERFORMED
Barbiturates: POSITIVE — AB
Cocaine: NOT DETECTED
Opiates: NOT DETECTED
Tetrahydrocannabinol: NOT DETECTED

## 2010-10-26 LAB — BASIC METABOLIC PANEL WITH GFR
BUN: 7
CO2: 22
Calcium: 9.6
Chloride: 101
Creatinine, Ser: 0.45
GFR calc non Af Amer: >60
Glucose, Bld: 153 — ABNORMAL HIGH
Potassium: 3.3 — ABNORMAL LOW
Sodium: 135

## 2010-10-26 LAB — CBC
MCHC: 35.1
MCV: 86.3
Platelets: 266
RBC: 4.67
RDW: 13

## 2010-10-26 LAB — ETHANOL: Alcohol, Ethyl (B): 7

## 2010-11-28 ENCOUNTER — Emergency Department (HOSPITAL_COMMUNITY)
Admission: EM | Admit: 2010-11-28 | Discharge: 2010-11-28 | Disposition: A | Discharge status: Home / Self Care | Payer: BC Managed Care – PPO | Attending: Emergency Medicine | Admitting: Emergency Medicine

## 2010-11-28 DIAGNOSIS — Z79899 Other long term (current) drug therapy: Secondary | ICD-10-CM | POA: Insufficient documentation

## 2010-11-28 DIAGNOSIS — E119 Type 2 diabetes mellitus without complications: Secondary | ICD-10-CM | POA: Insufficient documentation

## 2010-11-28 LAB — GLUCOSE, CAPILLARY

## 2010-11-28 LAB — URINALYSIS, ROUTINE W REFLEX MICROSCOPIC
Bilirubin Urine: NEGATIVE
Leukocytes, UA: NEGATIVE
Nitrite: NEGATIVE
Specific Gravity, Urine: 1.005 (ref 1.005–1.030)
Urobilinogen, UA: 0.2 mg/dL (ref 0.0–1.0)
pH: 6 (ref 5.0–8.0)

## 2010-11-28 LAB — URINE MICROSCOPIC-ADD ON

## 2010-11-28 LAB — COMPREHENSIVE METABOLIC PANEL
AST: 17 U/L (ref 0–37)
Albumin: 3.3 g/dL — ABNORMAL LOW (ref 3.5–5.2)
Alkaline Phosphatase: 129 U/L — ABNORMAL HIGH (ref 39–117)
BUN: 10 mg/dL (ref 6–23)
CO2: 23 mEq/L (ref 19–32)
Chloride: 96 mEq/L (ref 96–112)
GFR calc non Af Amer: >90 mL/min (ref >90)
Potassium: 3.7 mEq/L (ref 3.5–5.1)
Total Bilirubin: 0.4 mg/dL (ref 0.3–1.2)

## 2010-12-05 ENCOUNTER — Emergency Department (HOSPITAL_COMMUNITY)
Admission: EM | Admit: 2010-12-05 | Discharge: 2010-12-05 | Disposition: A | Discharge status: Home / Self Care | Payer: BC Managed Care – PPO | Attending: Emergency Medicine | Admitting: Emergency Medicine

## 2010-12-05 DIAGNOSIS — M79609 Pain in unspecified limb: Secondary | ICD-10-CM | POA: Insufficient documentation

## 2010-12-05 DIAGNOSIS — M545 Low back pain, unspecified: Secondary | ICD-10-CM | POA: Insufficient documentation

## 2010-12-05 DIAGNOSIS — M5380 Other specified dorsopathies, site unspecified: Secondary | ICD-10-CM | POA: Insufficient documentation

## 2010-12-05 DIAGNOSIS — IMO0001 Reserved for inherently not codable concepts without codable children: Secondary | ICD-10-CM | POA: Insufficient documentation

## 2010-12-05 DIAGNOSIS — E119 Type 2 diabetes mellitus without complications: Secondary | ICD-10-CM | POA: Insufficient documentation

## 2010-12-05 DIAGNOSIS — E669 Obesity, unspecified: Secondary | ICD-10-CM | POA: Insufficient documentation

## 2010-12-05 DIAGNOSIS — M5430 Sciatica, unspecified side: Secondary | ICD-10-CM | POA: Insufficient documentation

## 2010-12-05 DIAGNOSIS — Z794 Long term (current) use of insulin: Secondary | ICD-10-CM | POA: Insufficient documentation

## 2011-02-14 LAB — HM PAP SMEAR

## 2011-03-04 ENCOUNTER — Emergency Department (INDEPENDENT_AMBULATORY_CARE_PROVIDER_SITE_OTHER)
Admission: EM | Admit: 2011-03-04 | Discharge: 2011-03-04 | Disposition: A | Discharge status: Home / Self Care | Payer: BC Managed Care – PPO | Source: Home / Self Care | Attending: Emergency Medicine | Admitting: Emergency Medicine

## 2011-03-04 ENCOUNTER — Encounter (HOSPITAL_COMMUNITY): Payer: Self-pay | Admitting: Emergency Medicine

## 2011-03-04 DIAGNOSIS — B001 Herpesviral vesicular dermatitis: Secondary | ICD-10-CM

## 2011-03-04 DIAGNOSIS — J329 Chronic sinusitis, unspecified: Secondary | ICD-10-CM

## 2011-03-04 DIAGNOSIS — B009 Herpesviral infection, unspecified: Secondary | ICD-10-CM

## 2011-03-04 MED ORDER — VALACYCLOVIR HCL 1 G PO TABS: ORAL_TABLET | ORAL | Status: DC

## 2011-03-04 MED ORDER — AMOXICILLIN-POT CLAVULANATE 875-125 MG PO TABS: 1.0000 | ORAL_TABLET | Freq: Two times a day (BID) | ORAL | Status: AC

## 2011-03-04 MED ORDER — PSEUDOEPHEDRINE-GUAIFENESIN ER 120-1200 MG PO TB12: 1.0000 | ORAL_TABLET | Freq: Two times a day (BID) | ORAL | Status: DC

## 2011-03-04 MED ORDER — FLUTICASONE PROPIONATE 50 MCG/ACT NA SUSP: 2.0000 | Freq: Every day | NASAL | Status: DC

## 2011-03-04 NOTE — ED Notes (Signed)
Pt having sinus congestion, runny nose, headaches for 1 week. She took Sudafed with short relief. She has a cough that is sometimes yellow and/or blood tinged. She also has a possible cold sore on her mouth she would like looked at.

## 2011-03-04 NOTE — ED Provider Notes (Signed)
History     CSN: [id number]  Arrival date & time 03/04/11  1529   First MD Initiated Contact with Patient 03/04/11 1554      Chief Complaint  Patient presents with  . Nasal Congestion    (Consider location/radiation/quality/duration/timing/severity/associated sxs/prior treatment) HPI Comments: Patient states that sinus symptoms started one week ago. No purulent nasal discharge,  dental pain, fevers.   Patient also reports painful blister on right upper lip starting yesterday. Patient has never had this before. No other rash anywhere else.  Patient is a 41 y.o. female presenting with sinusitis and mouth sores. The history is provided by the patient. No language interpreter was used.  Sinusitis  This is a new problem. The current episode started more than 1 week ago. There has been no fever. Associated symptoms include congestion, sinus pressure, sore throat and cough. Pertinent negatives include no chills, no sweats, no ear pain, no hoarse voice, no swollen glands and no shortness of breath. Treatments tried: sudafed. The treatment provided mild relief.  Mouth Lesions  The current episode started yesterday. The onset was sudden. The symptoms are relieved by nothing. The symptoms are aggravated by nothing. Associated symptoms include congestion, mouth sores, sore throat and cough. Pertinent negatives include no ear pain and no swollen glands.    Past Medical History  Diagnosis Date  . Diabetes mellitus     Past Surgical History  Procedure Date  . Tubal ligation     History reviewed. No pertinent family history.  History  Substance Use Topics  . Smoking status: Never Smoker   . Smokeless tobacco: Not on file  . Alcohol Use: No    OB History    Grav Para Term Preterm Abortions TAB SAB Ect Mult Living                  Review of Systems  Constitutional: Negative for chills.  HENT: Positive for congestion, sore throat, mouth sores and sinus pressure. Negative for ear  pain and hoarse voice.   Respiratory: Positive for cough. Negative for shortness of breath.     Allergies  Codeine  Home Medications   Current Outpatient Rx  Name Route Sig Dispense Refill  . INSULIN ASPART 100 UNIT/ML [place] SOLN Subcutaneous Inject 20 Units into the skin 3 (three) times daily before meals.    . INSULIN GLARGINE 100 UNIT/ML [place] SOLN Subcutaneous Inject 30 Units into the skin at bedtime.    . AMOXICILLIN-POT CLAVULANATE 875-125 MG PO TABS Oral Take 1 tablet by mouth 2 (two) times daily. X 7 days 14 tablet 0  . FLUTICASONE PROPIONATE 50 MCG/ACT NA SUSP Nasal Place 2 sprays into the nose daily. 16 g 0  . PSEUDOEPHEDRINE-GUAIFENESIN ER [phone] MG PO TB12 Oral Take 1 tablet by mouth 2 (two) times daily. 20 each 0  . VALACYCLOVIR HCL 1 G PO TABS  2 tabs po every 12 hours x 1 day. Start with sx onset 14 tablet 0    BP 131/78  Pulse 82  Temp(Src) 98.2 F (36.8 C) (Oral)  Resp 18  SpO2 100%  LMP 02/22/2011  Physical Exam  Nursing note and vitals reviewed. Constitutional: She is oriented to person, place, and time. She appears well-developed and well-nourished.  HENT:  Head: Normocephalic and atraumatic.  Right Ear: Tympanic membrane and ear canal normal.  Left Ear: Tympanic membrane and ear canal normal.  Nose: Mucosal edema and rhinorrhea present. No epistaxis.  Mouth/Throat: Uvula is midline and mucous membranes are normal.  Posterior oropharyngeal erythema present. No oropharyngeal exudate.         (+) frontal, maxillary sinus tenderness. No intraoral ulcers.  Eyes: Conjunctivae and EOM are normal.  Neck: Normal range of motion. Neck supple.  Cardiovascular: Normal rate, regular rhythm and normal heart sounds.   Pulmonary/Chest: Effort normal and breath sounds normal.  Abdominal: Soft. She exhibits no distension.  Musculoskeletal: Normal range of motion.  Lymphadenopathy:    She has no cervical adenopathy.  Neurological: She is alert and oriented to person,  place, and time.  Skin: Skin is warm and dry. No rash noted.  Psychiatric: She has a normal mood and affect. Her behavior is normal. Judgment and thought content normal.    ED Course  Procedures (including critical care time)  Labs Reviewed - No data to display No results found.   1. Sinusitis   2. Herpes labialis       MDM   Has had  Sinus sx for 7 days. Will give pt a wait and see rx for abx,. Will start flonase, mucinex-d, increase fluids, nasal saline irrigation,  tylenol/motrin prn pain. Pt also with new onset herpes labialis. Sx started < 24 hr ago will start on valtrex. Discussed MDM and plan with pt. Pt agrees with plan and will f/u with PMD prn.    Luiz Blare, MD 03/04/11 [phone]

## 2011-04-26 ENCOUNTER — Emergency Department (HOSPITAL_COMMUNITY): Payer: BC Managed Care – PPO

## 2011-04-26 ENCOUNTER — Encounter (HOSPITAL_COMMUNITY): Payer: Self-pay | Admitting: Emergency Medicine

## 2011-04-26 ENCOUNTER — Emergency Department (HOSPITAL_COMMUNITY)
Admission: EM | Admit: 2011-04-26 | Discharge: 2011-04-26 | Disposition: A | Discharge status: Home / Self Care | Payer: BC Managed Care – PPO | Attending: Emergency Medicine | Admitting: Emergency Medicine

## 2011-04-26 DIAGNOSIS — Y998 Other external cause status: Secondary | ICD-10-CM | POA: Insufficient documentation

## 2011-04-26 DIAGNOSIS — Y93B1 Activity, exercise machines primarily for muscle strengthening: Secondary | ICD-10-CM | POA: Insufficient documentation

## 2011-04-26 DIAGNOSIS — X58XXXA Exposure to other specified factors, initial encounter: Secondary | ICD-10-CM | POA: Insufficient documentation

## 2011-04-26 DIAGNOSIS — S76019A Strain of muscle, fascia and tendon of unspecified hip, initial encounter: Secondary | ICD-10-CM

## 2011-04-26 DIAGNOSIS — E119 Type 2 diabetes mellitus without complications: Secondary | ICD-10-CM | POA: Insufficient documentation

## 2011-04-26 DIAGNOSIS — Z794 Long term (current) use of insulin: Secondary | ICD-10-CM | POA: Insufficient documentation

## 2011-04-26 DIAGNOSIS — IMO0002 Reserved for concepts with insufficient information to code with codable children: Secondary | ICD-10-CM | POA: Insufficient documentation

## 2011-04-26 DIAGNOSIS — Y9239 Other specified sports and athletic area as the place of occurrence of the external cause: Secondary | ICD-10-CM | POA: Insufficient documentation

## 2011-04-26 DIAGNOSIS — Y92838 Other recreation area as the place of occurrence of the external cause: Secondary | ICD-10-CM | POA: Insufficient documentation

## 2011-04-26 HISTORY — DX: Morbid (severe) obesity due to excess calories: E66.01

## 2011-04-26 NOTE — Discharge Instructions (Signed)
Take ibuprofen as directed.  Rest the area and do not use that machine at the gym until muscle is healed.  Apply ice to the area 20 minutes at a time.  Do gentle stretching to the area. Return if the area becomes red, warm to the touch, or swollen, or you develop fevers

## 2011-04-26 NOTE — ED Provider Notes (Signed)
History     CSN: [id number]  Arrival date & time 04/26/11  1700   First MD Initiated Contact with Patient 04/26/11 1905      Chief Complaint  Patient presents with  . Hip Pain    (Consider location/radiation/quality/duration/timing/severity/associated sxs/prior treatment) HPI Comments: Patient reports that 3 days ago she tried a new machine at the gym that she has never used.  She reports that it was a machine that worked the leg.  Two days ago she began having pain in her right hip and right groin area.  She reports that she only feels the pain when she walks and moves the leg.  She describes the pain as a knot and a pulling pain.  Patient is a 41 y.o. female presenting with hip pain. The history is provided by the patient.  Hip Pain Pertinent negatives include no chills, fever or joint swelling.    Past Medical History  Diagnosis Date  . Diabetes mellitus   . Morbid obesity     Past Surgical History  Procedure Date  . Tubal ligation     History reviewed. No pertinent family history.  History  Substance Use Topics  . Smoking status: Never Smoker   . Smokeless tobacco: Not on file  . Alcohol Use: No    OB History    Grav Para Term Preterm Abortions TAB SAB Ect Mult Living                  Review of Systems  Constitutional: Negative for fever and chills.  Musculoskeletal: Negative for joint swelling.  Skin: Negative for color change and wound.    Allergies  Codeine  Home Medications   Current Outpatient Rx  Name Route Sig Dispense Refill  . INSULIN ASPART 100 UNIT/ML [place] SOLN Subcutaneous Inject 10 Units into the skin 2 (two) times daily before a meal. Inject 10 units twice daily plus sliding scale units if needed based on blood sugar.    . INSULIN GLARGINE 100 UNIT/ML [place] SOLN Subcutaneous Inject 10 Units into the skin at bedtime.       BP 134/71  Pulse 66  Temp(Src) 98.4 F (36.9 C) (Oral)  Resp 18  Ht 5\' 6"  (1.676 m)  Wt 310 lb (140.615 kg)  BMI  50.04 kg/m2  SpO2 98%  LMP 04/12/2011  Physical Exam  Nursing note and vitals reviewed. Constitutional: She is oriented to person, place, and time. She appears well-developed and well-nourished. No distress.  HENT:  Head: Normocephalic and atraumatic.  Neck: Normal range of motion. Neck supple.  Cardiovascular: Normal rate, regular rhythm and normal heart sounds.        Dorsal pedis pulse 2+ bilatarally  Pulmonary/Chest: Effort normal and breath sounds normal.  Musculoskeletal:       Right hip: She exhibits normal range of motion, normal strength, no bony tenderness, no swelling and no deformity.  Neurological: She is alert and oriented to person, place, and time. No sensory deficit. Gait normal.       Distal sensation of LE intact bilaterally  Skin: Skin is warm and dry. She is not diaphoretic. No erythema.  Psychiatric: She has a normal mood and affect.    ED Course  Procedures (including critical care time)  Labs Reviewed - No data to display Dg Hip Complete Right  04/26/2011  *RADIOLOGY REPORT*  Clinical Data: Right hip pain for 3 days.  No known injury.  RIGHT HIP - COMPLETE 2+ VIEW  Comparison: None.  Findings:  Both hips are located.  There is no fracture.  Soft tissues are unremarkable.  IMPRESSION: No acute finding.  Stable compared prior.  Original Report Authenticated By: Bernadene Bell. D'ALESSIO, M.D.     1. Strain of flexor muscle of hip       MDM  Patient with pain of hip flexor the day after using a new leg machine at the gym.  No pain at rest, but pain with ambulation.  Therefore, feel that symptoms are most likely hip flexor strain.  Patient neurovascularly intact.  No erythema, warmth, or tenderness to palpation of the hip joint.        Pascal Lux [place], PA-C 04/27/11 0246

## 2011-04-26 NOTE — ED Notes (Signed)
Pt reports pain in right hip since Wednesday that is worse with ambulation; denies any GI or GU symptoms. Denies injury.  Pain is 10/10 with ambulation but mild at rest/sitting. Pt took ibuprofen without relief.

## 2011-04-27 NOTE — ED Provider Notes (Signed)
Medical screening examination/treatment/procedure(s) were performed by non-physician practitioner and as supervising physician I was immediately available for consultation/collaboration. Devoria Albe, MD, [facility]   Ward Givens, MD 04/27/11 [phone]

## 2011-06-02 ENCOUNTER — Emergency Department (HOSPITAL_COMMUNITY)
Admission: EM | Admit: 2011-06-02 | Discharge: 2011-06-02 | Disposition: A | Discharge status: Home / Self Care | Payer: BC Managed Care – PPO | Attending: Emergency Medicine | Admitting: Emergency Medicine

## 2011-06-02 ENCOUNTER — Emergency Department (HOSPITAL_COMMUNITY): Payer: BC Managed Care – PPO

## 2011-06-02 ENCOUNTER — Encounter (HOSPITAL_COMMUNITY): Payer: Self-pay | Admitting: *Deleted

## 2011-06-02 DIAGNOSIS — R05 Cough: Secondary | ICD-10-CM | POA: Insufficient documentation

## 2011-06-02 DIAGNOSIS — R739 Hyperglycemia, unspecified: Secondary | ICD-10-CM

## 2011-06-02 DIAGNOSIS — Z794 Long term (current) use of insulin: Secondary | ICD-10-CM | POA: Insufficient documentation

## 2011-06-02 DIAGNOSIS — J329 Chronic sinusitis, unspecified: Secondary | ICD-10-CM

## 2011-06-02 DIAGNOSIS — R071 Chest pain on breathing: Secondary | ICD-10-CM | POA: Insufficient documentation

## 2011-06-02 DIAGNOSIS — R059 Cough, unspecified: Secondary | ICD-10-CM | POA: Insufficient documentation

## 2011-06-02 DIAGNOSIS — R509 Fever, unspecified: Secondary | ICD-10-CM | POA: Insufficient documentation

## 2011-06-02 DIAGNOSIS — R0789 Other chest pain: Secondary | ICD-10-CM

## 2011-06-02 DIAGNOSIS — E119 Type 2 diabetes mellitus without complications: Secondary | ICD-10-CM | POA: Insufficient documentation

## 2011-06-02 DIAGNOSIS — J3489 Other specified disorders of nose and nasal sinuses: Secondary | ICD-10-CM | POA: Insufficient documentation

## 2011-06-02 DIAGNOSIS — R109 Unspecified abdominal pain: Secondary | ICD-10-CM | POA: Insufficient documentation

## 2011-06-02 DIAGNOSIS — J069 Acute upper respiratory infection, unspecified: Secondary | ICD-10-CM | POA: Insufficient documentation

## 2011-06-02 LAB — POCT I-STAT, CHEM 8
BUN: 9 mg/dL (ref 6–23)
Sodium: 136 mEq/L (ref 135–145)
TCO2: 25 mmol/L (ref 0–100)

## 2011-06-02 LAB — GLUCOSE, CAPILLARY
Glucose-Capillary: 414 mg/dL — ABNORMAL HIGH (ref 70–99)
Glucose-Capillary: 351 mg/dL — ABNORMAL HIGH (ref 70–99)

## 2011-06-02 LAB — URINALYSIS, ROUTINE W REFLEX MICROSCOPIC
Glucose, UA: >1000 mg/dL — AB
Leukocytes, UA: NEGATIVE
Protein, ur: NEGATIVE mg/dL
Specific Gravity, Urine: 1.042 — ABNORMAL HIGH (ref 1.005–1.030)
pH: 6.5 (ref 5.0–8.0)

## 2011-06-02 LAB — COMPREHENSIVE METABOLIC PANEL
AST: 21 U/L (ref 0–37)
Albumin: 3.2 g/dL — ABNORMAL LOW (ref 3.5–5.2)
Calcium: 9.1 mg/dL (ref 8.4–10.5)
Creatinine, Ser: 0.49 mg/dL — ABNORMAL LOW (ref 0.50–1.10)
GFR calc non Af Amer: >90 mL/min (ref >90)
Total Protein: 8.1 g/dL (ref 6.0–8.3)

## 2011-06-02 LAB — CBC
MCH: 29.7 pg (ref 26.0–34.0)
MCHC: 36.6 g/dL — ABNORMAL HIGH (ref 30.0–36.0)
MCV: 81.2 fL (ref 78.0–100.0)
Platelets: 234 10*3/uL (ref 150–400)
RDW: 12.4 % (ref 11.5–15.5)

## 2011-06-02 LAB — BLOOD GAS, ARTERIAL
Drawn by: 308601
FIO2: 0.21 %
O2 Saturation: 93.6 %
Patient temperature: 98.6
pO2, Arterial: 71.7 mmHg — ABNORMAL LOW (ref 80.0–100.0)

## 2011-06-02 MED ORDER — POTASSIUM CHLORIDE CRYS ER 20 MEQ PO TBCR
40.0000 meq | EXTENDED_RELEASE_TABLET | Freq: Once | ORAL | Status: AC
  Administered 2011-06-02: 40 meq via ORAL
  Filled 2011-06-02: qty 2

## 2011-06-02 MED ORDER — IOHEXOL 300 MG/ML  SOLN
100.0000 mL | Freq: Once | INTRAMUSCULAR | Status: AC | PRN
  Administered 2011-06-02: 100 mL via INTRAVENOUS

## 2011-06-02 MED ORDER — INSULIN REGULAR BOLUS VIA INFUSION
0.0000 [IU] | Freq: Three times a day (TID) | INTRAVENOUS | Status: DC
  Filled 2011-06-02: qty 10

## 2011-06-02 MED ORDER — MOXIFLOXACIN HCL 400 MG PO TABS
400.0000 mg | ORAL_TABLET | Freq: Once | ORAL | Status: AC
  Administered 2011-06-02: 400 mg via ORAL
  Filled 2011-06-02: qty 1

## 2011-06-02 MED ORDER — INSULIN REGULAR HUMAN 100 UNIT/ML IJ SOLN
INTRAMUSCULAR | Status: DC
  Administered 2011-06-02: 3.5 [IU]/h via INTRAVENOUS
  Filled 2011-06-02: qty 1

## 2011-06-02 MED ORDER — ZITHROMAX Z-PAK 250 MG PO TABS: 250.0000 mg | ORAL_TABLET | Freq: Every day | ORAL | Status: AC

## 2011-06-02 MED ORDER — SODIUM CHLORIDE 0.9 % IV BOLUS (SEPSIS)
1000.0000 mL | Freq: Once | INTRAVENOUS | Status: AC
  Administered 2011-06-02: 1000 mL via INTRAVENOUS

## 2011-06-02 MED ORDER — SODIUM CHLORIDE 0.9 % IV SOLN
INTRAVENOUS | Status: DC
  Administered 2011-06-02: 05:00:00 via INTRAVENOUS

## 2011-06-02 MED ORDER — DEXTROSE 50 % IV SOLN: 25.0000 mL | INTRAVENOUS | Status: DC | PRN

## 2011-06-02 MED ORDER — DEXTROSE-NACL 5-0.45 % IV SOLN
INTRAVENOUS | Status: DC
  Administered 2011-06-02: 100 mL/h via INTRAVENOUS

## 2011-06-02 MED ORDER — HYDROCODONE-ACETAMINOPHEN 5-500 MG PO TABS: 1.0000 | ORAL_TABLET | Freq: Four times a day (QID) | ORAL | Status: AC | PRN

## 2011-06-02 NOTE — Discharge Instructions (Signed)
Upper Respiratory Infection, Adult  Rest, drink plenty fluids and take medications as prescribed.   An upper respiratory infection (URI) is also sometimes known as the common cold. The upper respiratory tract includes the nose, sinuses, throat, trachea, and bronchi. Bronchi are the airways leading to the lungs. Most people improve within 1 week, but symptoms can last up to 2 weeks. A residual cough may last even longer.  CAUSES  Many different viruses can infect the tissues lining the upper respiratory tract. The tissues become irritated and inflamed and often become very moist. Mucus production is also common. A cold is contagious. You can easily spread the virus to others by oral contact. This includes kissing, sharing a glass, coughing, or sneezing. Touching your mouth or nose and then touching a surface, which is then touched by another person, can also spread the virus.  SYMPTOMS  Symptoms typically develop 1 to 3 days after you come in contact with a cold virus. Symptoms vary from person to person. They may include:  Runny nose.  Sneezing.  Nasal congestion.  Sinus irritation.  Sore throat.  Loss of voice (laryngitis).  Cough.  Fatigue.  Muscle aches.  Loss of appetite.  Headache.  Low-grade fever.  DIAGNOSIS  You might diagnose your own cold based on familiar symptoms, since most people get a cold 2 to 3 times a year. Your caregiver can confirm this based on your exam. Most importantly, your caregiver can check that your symptoms are not due to another disease such as strep throat, sinusitis, pneumonia, asthma, or epiglottitis. Blood tests, throat tests, and X-rays are not necessary to diagnose a common cold, but they may sometimes be helpful in excluding other more serious diseases. Your caregiver will decide if any further tests are required.  RISKS AND COMPLICATIONS  You may be at risk for a more severe case of the common cold if you smoke cigarettes, have chronic heart disease (such  as heart failure) or lung disease (such as asthma), or if you have a weakened immune system. The very young and very old are also at risk for more serious infections. Bacterial sinusitis, middle ear infections, and bacterial pneumonia can complicate the common cold. The common cold can worsen asthma and chronic obstructive pulmonary disease (COPD). Sometimes, these complications can require emergency medical care and may be life-threatening.  PREVENTION  The best way to protect against getting a cold is to practice good hygiene. Avoid oral or hand contact with people with cold symptoms. Wash your hands often if contact occurs. There is no clear evidence that vitamin C, vitamin E, echinacea, or exercise reduces the chance of developing a cold. However, it is always recommended to get plenty of rest and practice good nutrition.  TREATMENT  Treatment is directed at relieving symptoms. There is no cure. Antibiotics are not effective, because the infection is caused by a virus, not by bacteria. Treatment may include:  Increased fluid intake. Sports drinks offer valuable electrolytes, sugars, and fluids.  Breathing heated mist or steam (vaporizer or shower).  Eating chicken soup or other clear broths, and maintaining good nutrition.  Getting plenty of rest.  Using gargles or lozenges for comfort.  Controlling fevers with ibuprofen or acetaminophen as directed by your caregiver.  Increasing usage of your inhaler if you have asthma.  Zinc gel and zinc lozenges, taken in the first 24 hours of the common cold, can shorten the duration and lessen the severity of symptoms. Pain medicines may help with fever, muscle  aches, and throat pain. A variety of non-prescription medicines are available to treat congestion and runny nose. Your caregiver can make recommendations and may suggest nasal or lung inhalers for other symptoms.  HOME CARE INSTRUCTIONS  Only take over-the-counter or prescription medicines for pain,  discomfort, or fever as directed by your caregiver.  Use a warm mist humidifier or inhale steam from a shower to increase air moisture. This may keep secretions moist and make it easier to breathe.  Drink enough water and fluids to keep your urine clear or pale yellow.  Rest as needed.  Return to work when your temperature has returned to normal or as your caregiver advises. You may need to stay home longer to avoid infecting others. You can also use a face mask and careful hand washing to prevent spread of the virus.  SEEK MEDICAL CARE IF:  After the first few days, you feel you are getting worse rather than better.  You need your caregiver's advice about medicines to control symptoms.  You develop chills, worsening shortness of breath, or brown or red sputum. These may be signs of pneumonia.  You develop yellow or brown nasal discharge or pain in the face, especially when you bend forward. These may be signs of sinusitis.  You develop a fever, swollen neck glands, pain with swallowing, or white areas in the back of your throat. These may be signs of strep throat.  SEEK IMMEDIATE MEDICAL CARE IF:  You have a fever.  You develop severe or persistent headache, ear pain, sinus pain, or chest pain.  You develop wheezing, a prolonged cough, cough up blood, or have a change in your usual mucus (if you have chronic lung disease).  You develop sore muscles or a stiff neck.

## 2011-06-02 NOTE — ED Notes (Signed)
Patient transported to X-ray 

## 2011-06-02 NOTE — ED Notes (Signed)
MD at bedside. 

## 2011-06-02 NOTE — ED Provider Notes (Signed)
History     CSN: [id number]  Arrival date & time 06/02/11  0227   First MD Initiated Contact with Patient 06/02/11 [phone]      Chief Complaint  Patient presents with  . Nasal Congestion  . Flank Pain  . Cough  . Fever    (Consider location/radiation/quality/duration/timing/severity/associated sxs/prior treatment) The history is provided by the patient.   diabetic with congestion for last week and cough. Now with right sided rib pain after coughing, worse with breathing, the patient attributes to injury from coughing.  No fevers. States since being sick for last week her sugars have been more out of control. No nausea vomiting or diarrhea. No leg pain or swelling. No history of DVT or PE. No rashes. No history of similar symptoms. Moderate in severity.  Past Medical History  Diagnosis Date  . Diabetes mellitus   . Morbid obesity     Past Surgical History  Procedure Date  . Tubal ligation     No family history on file.  History  Substance Use Topics  . Smoking status: Never Smoker   . Smokeless tobacco: Not on file  . Alcohol Use: No    OB History    Grav Para Term Preterm Abortions TAB SAB Ect Mult Living                  Review of Systems  Constitutional: Negative for fever and chills.  HENT: Positive for congestion. Negative for neck pain and neck stiffness.   Eyes: Negative for pain.  Respiratory: Positive for cough. Negative for shortness of breath.   Cardiovascular: Negative for leg swelling.  Gastrointestinal: Negative for nausea, vomiting and constipation.  Genitourinary: Negative for dysuria.  Musculoskeletal: Negative for back pain.  Skin: Negative for rash.  Neurological: Negative for headaches.  All other systems reviewed and are negative.    Allergies  Codeine  Home Medications   Current Outpatient Rx  Name Route Sig Dispense Refill  . INSULIN ASPART 100 UNIT/ML [place] SOLN Subcutaneous Inject 10 Units into the skin 2 (two) times daily before  a meal. Inject 10 units twice daily plus sliding scale units if needed based on blood sugar.    . INSULIN GLARGINE 100 UNIT/ML [place] SOLN Subcutaneous Inject 10 Units into the skin at bedtime.       BP 136/81  Pulse 93  Temp 97.9 F (36.6 C)  Resp 20  SpO2 94%  LMP 05/27/2011  Physical Exam  Constitutional: She is oriented to person, place, and time. She appears well-developed and well-nourished.  HENT:  Head: Normocephalic and atraumatic.  Right Ear: External ear normal.  Left Ear: External ear normal.  Mouth/Throat: No oropharyngeal exudate.       Nasal congestion  Eyes: Conjunctivae and EOM are normal. Pupils are equal, round, and reactive to light.  Neck: Trachea normal. Neck supple. No tracheal deviation present.  Cardiovascular: Normal rate, regular rhythm, S1 normal, S2 normal and normal pulses.     No systolic murmur is present   No diastolic murmur is present  Pulses:      Radial pulses are 2+ on the right side, and 2+ on the left side.  Pulmonary/Chest: Effort normal and breath sounds normal. She has no wheezes. She has no rhonchi. She has no rales.       Mild right posterior lateral chest wall tenderness without crepitus or rash  Abdominal: Soft. Normal appearance and bowel sounds are normal. There is no tenderness. There is no CVA  tenderness and negative Murphy's sign.  Musculoskeletal:       BLE:s [place] nontender, no cords or erythema, negative Homans sign  Neurological: She is alert and oriented to person, place, and time. She has normal strength. No cranial nerve deficit or sensory deficit. GCS eye subscore is 4. GCS verbal subscore is 5. GCS motor subscore is 6.  Skin: Skin is warm and dry. No rash noted. She is not diaphoretic.  Psychiatric: Her speech is normal.       Cooperative and appropriate    ED Course  Procedures (including critical care time)  Results for orders placed during the hospital encounter of 06/02/11  CBC      Component Value Range   WBC  6.7  4.0 - 10.5 (K/uL)   RBC 4.31  3.87 - 5.11 (MIL/uL)   Hemoglobin 12.8  12.0 - 15.0 (g/dL)   HCT 16.1 (*) 09.6 - 46.0 (%)   MCV 81.2  78.0 - 100.0 (fL)   MCH 29.7  26.0 - 34.0 (pg)   MCHC 36.6 (*) 30.0 - 36.0 (g/dL)   RDW 04.5  40.9 - 81.1 (%)   Platelets 234  150 - 400 (K/uL)  COMPREHENSIVE METABOLIC PANEL      Component Value Range   Sodium 131 (*) 135 - 145 (mEq/L)   Potassium 3.3 (*) 3.5 - 5.1 (mEq/L)   Chloride 95 (*) 96 - 112 (mEq/L)   CO2 24  19 - 32 (mEq/L)   Glucose, Bld 462 (*) 70 - 99 (mg/dL)   BUN 10  6 - 23 (mg/dL)   Creatinine, Ser 9.14 (*) 0.50 - 1.10 (mg/dL)   Calcium 9.1  8.4 - 78.2 (mg/dL)   Total Protein 8.1  6.0 - 8.3 (g/dL)   Albumin 3.2 (*) 3.5 - 5.2 (g/dL)   AST 21  0 - 37 (U/L)   ALT 22  0 - 35 (U/L)   Alkaline Phosphatase 157 (*) 39 - 117 (U/L)   Total Bilirubin 0.5  0.3 - 1.2 (mg/dL)   GFR calc non Af Amer >90  >90 (mL/min)   GFR calc Af Amer >90  >90 (mL/min)  D-DIMER, QUANTITATIVE      Component Value Range   D-Dimer, Quant 0.55 (*) 0.00 - 0.48 (ug/mL-FEU)  URINALYSIS, ROUTINE W REFLEX MICROSCOPIC      Component Value Range   Color, Urine YELLOW  YELLOW    APPearance CLEAR  CLEAR    Specific Gravity, Urine 1.042 (*) 1.005 - 1.030    pH 6.5  5.0 - 8.0    Glucose, UA >1000 (*) NEGATIVE (mg/dL)   Hgb urine dipstick NEGATIVE  NEGATIVE    Bilirubin Urine NEGATIVE  NEGATIVE    Ketones, ur 15 (*) NEGATIVE (mg/dL)   Protein, ur NEGATIVE  NEGATIVE (mg/dL)   Urobilinogen, UA 1.0  0.0 - 1.0 (mg/dL)   Nitrite NEGATIVE  NEGATIVE    Leukocytes, UA NEGATIVE  NEGATIVE   POCT I-STAT, CHEM 8      Component Value Range   Sodium 136  135 - 145 (mEq/L)   Potassium 3.5  3.5 - 5.1 (mEq/L)   Chloride 100  96 - 112 (mEq/L)   BUN 9  6 - 23 (mg/dL)   Creatinine, Ser 9.56  0.50 - 1.10 (mg/dL)   Glucose, Bld 213 (*) 70 - 99 (mg/dL)   Calcium, Ion 0.86  5.78 - 1.32 (mmol/L)   TCO2 25  0 - 100 (mmol/L)   Hemoglobin 12.9  12.0 - 15.0 (  g/dL)   HCT 60.4  54.0 -  98.1 (%)  BLOOD GAS, ARTERIAL      Component Value Range   FIO2 0.21     Delivery systems ROOM AIR     pH, Arterial 7.405 (*) 7.350 - 7.400    pCO2 arterial 40.8  35.0 - 45.0 (mmHg)   pO2, Arterial 71.7 (*) 80.0 - 100.0 (mmHg)   Bicarbonate 25.0 (*) 20.0 - 24.0 (mEq/L)   TCO2 22.7  0 - 100 (mmol/L)   Acid-Base Excess 0.8  0.0 - 2.0 (mmol/L)   O2 Saturation 93.6     Patient temperature 98.6     Collection site RIGHT RADIAL     Drawn by [id number]     Sample type ARTERIAL DRAW     Allens test (pass/fail) PASS  PASS   GLUCOSE, CAPILLARY      Component Value Range   Glucose-Capillary 414 (*) 70 - 99 (mg/dL)  URINE MICROSCOPIC-ADD ON      Component Value Range   Squamous Epithelial / LPF RARE  RARE   GLUCOSE, CAPILLARY      Component Value Range   Glucose-Capillary 351 (*) 70 - 99 (mg/dL)   Dg Chest 2 View  2/95/6213  *RADIOLOGY REPORT*  Clinical Data: Cough, shortness of breath.  CHEST - 2 VIEW  Comparison: There is 07/26/2009  Findings: Cardiomediastinal contours within normal limits. Minimal streaky retrocardiac opacity on the lateral view is likely atelectasis or scarring, similar to prior.  No pleural effusion or pneumothorax.  No acute osseous abnormality.  IMPRESSION: No acute process identified.  Original Report Authenticated By: Waneta Martins, M.D.   Ct Angio Chest W/cm &/or Wo Cm  06/02/2011  *RADIOLOGY REPORT*  Clinical Data: Chest pain  CT ANGIOGRAPHY CHEST  Technique:  Multidetector CT imaging of the chest using the standard protocol during bolus administration of intravenous contrast. Multiplanar reconstructed images including MIPs were obtained and reviewed to evaluate the vascular anatomy.  Contrast: OMNIPAQUE IOHEXOL 300 MG/ML  SOLN  Comparison: 06/02/2011 radiograph  Findings: Due to patient body habitus and access site in the hand, the contrast bolus is suboptimal.  No main branch filling defect is identified however the more peripheral branches are poorly  opacified, limiting evaluation.  Normal caliber aorta.  Normal heart size.  No pleural or pericardial effusion.  No intrathoracic lymphadenopathy.  Limited images through the upper abdomen show no acute abnormality.  Central airways are patent.  No pneumothorax.  There are patchy nodular opacities within the retrocardiac left lower lobe.  Multilevel degenerative changes of the imaged spine. No acute or aggressive appearing osseous lesion.  IMPRESSION: No main branch pulmonary arterial filling defect.  The more peripheral branches are poorly opacified as detailed above.  Patchy nodular opacities within the retrocardiac/left lower lobe. This likely corresponds to the appearance on radiograph and may be infectious or inflammatory.  Original Report Authenticated By: Waneta Martins, M.D.    IV fluids. UA and labs reviewed as above. Insulin initiated for hyperglycemia. CT scan obtained and reviewed for elevated d-dimer.  MDM   Congestion and cough consistent with clinical sinusitis/URI, now with rib pain likely due to coughing. Given CT findings with questionable opacity, ABx initiated. Blood sugars normalizing and stable for discharge home and outpatient followup.         Sunnie Nielsen, MD 06/02/11 [phone]

## 2011-06-03 LAB — GLUCOSE, CAPILLARY: Glucose-Capillary: 242 mg/dL — ABNORMAL HIGH (ref 70–99)

## 2011-12-15 ENCOUNTER — Encounter (HOSPITAL_COMMUNITY): Payer: Self-pay

## 2011-12-15 ENCOUNTER — Emergency Department (HOSPITAL_COMMUNITY)
Admission: EM | Admit: 2011-12-15 | Discharge: 2011-12-16 | Disposition: A | Discharge status: Home / Self Care | Payer: BC Managed Care – PPO | Attending: Emergency Medicine | Admitting: Emergency Medicine

## 2011-12-15 DIAGNOSIS — Z794 Long term (current) use of insulin: Secondary | ICD-10-CM | POA: Insufficient documentation

## 2011-12-15 DIAGNOSIS — Z79899 Other long term (current) drug therapy: Secondary | ICD-10-CM | POA: Insufficient documentation

## 2011-12-15 DIAGNOSIS — E119 Type 2 diabetes mellitus without complications: Secondary | ICD-10-CM | POA: Insufficient documentation

## 2011-12-15 DIAGNOSIS — J329 Chronic sinusitis, unspecified: Secondary | ICD-10-CM | POA: Insufficient documentation

## 2011-12-15 DIAGNOSIS — R51 Headache: Secondary | ICD-10-CM | POA: Insufficient documentation

## 2011-12-15 LAB — URINALYSIS, ROUTINE W REFLEX MICROSCOPIC
Glucose, UA: NEGATIVE mg/dL
Leukocytes, UA: NEGATIVE
Nitrite: NEGATIVE
Specific Gravity, Urine: 1.016 (ref 1.005–1.030)
pH: 7 (ref 5.0–8.0)

## 2011-12-15 LAB — BASIC METABOLIC PANEL
Calcium: 9.2 mg/dL (ref 8.4–10.5)
GFR calc Af Amer: >90 mL/min (ref >90)
GFR calc non Af Amer: >90 mL/min (ref >90)
Glucose, Bld: 171 mg/dL — ABNORMAL HIGH (ref 70–99)
Potassium: 3.4 mEq/L — ABNORMAL LOW (ref 3.5–5.1)
Sodium: 134 mEq/L — ABNORMAL LOW (ref 135–145)

## 2011-12-15 LAB — URINE MICROSCOPIC-ADD ON

## 2011-12-15 LAB — GLUCOSE, CAPILLARY: Glucose-Capillary: 178 mg/dL — ABNORMAL HIGH (ref 70–99)

## 2011-12-15 LAB — CBC
Hemoglobin: 12.4 g/dL (ref 12.0–15.0)
MCH: 29.8 pg (ref 26.0–34.0)
Platelets: 247 10*3/uL (ref 150–400)
RBC: 4.16 MIL/uL (ref 3.87–5.11)
WBC: 4.5 10*3/uL (ref 4.0–10.5)

## 2011-12-15 LAB — PREGNANCY, URINE: Preg Test, Ur: NEGATIVE

## 2011-12-15 MED ORDER — KETOROLAC TROMETHAMINE 30 MG/ML IJ SOLN
30.0000 mg | Freq: Once | INTRAMUSCULAR | Status: AC
  Administered 2011-12-15: 30 mg via INTRAMUSCULAR
  Filled 2011-12-15: qty 1

## 2011-12-15 MED ORDER — PSEUDOEPHEDRINE HCL ER 120 MG PO TB12
120.0000 mg | ORAL_TABLET | Freq: Two times a day (BID) | ORAL | Status: DC
  Administered 2011-12-15: 120 mg via ORAL
  Filled 2011-12-15: qty 1

## 2011-12-15 MED ORDER — HYDROCODONE-ACETAMINOPHEN 5-325 MG PO TABS
1.0000 | ORAL_TABLET | Freq: Once | ORAL | Status: AC
  Administered 2011-12-15: 1 via ORAL
  Filled 2011-12-15: qty 1

## 2011-12-15 MED ORDER — AMOXICILLIN-POT CLAVULANATE 875-125 MG PO TABS: 1.0000 | ORAL_TABLET | Freq: Two times a day (BID) | ORAL | Status: DC

## 2011-12-15 MED ORDER — SALINE SPRAY 0.65 % NA SOLN: 1.0000 | NASAL | Status: DC | PRN

## 2011-12-15 MED ORDER — SALINE SPRAY 0.65 % NA SOLN
1.0000 | Freq: Once | NASAL | Status: AC
  Administered 2011-12-15: 1 via NASAL
  Filled 2011-12-15: qty 44

## 2011-12-15 MED ORDER — HYDROCODONE-ACETAMINOPHEN 5-325 MG PO TABS: 1.0000 | ORAL_TABLET | Freq: Four times a day (QID) | ORAL | Status: DC | PRN

## 2011-12-15 MED ORDER — PSEUDOEPHEDRINE HCL ER 120 MG PO TB12: 120.0000 mg | ORAL_TABLET | Freq: Two times a day (BID) | ORAL | Status: DC

## 2011-12-15 MED ORDER — AMOXICILLIN-POT CLAVULANATE 875-125 MG PO TABS
1.0000 | ORAL_TABLET | Freq: Once | ORAL | Status: AC
  Administered 2011-12-16: 1 via ORAL
  Filled 2011-12-15: qty 1

## 2011-12-15 NOTE — ED Provider Notes (Addendum)
History     CSN: [id number]  Arrival date & time 12/15/11  2025   First MD Initiated Contact with Patient 12/15/11 2125      Chief Complaint  Patient presents with  . Dizziness  . Headache    (Consider location/radiation/quality/duration/timing/severity/associated sxs/prior treatment) HPI Comments: Sinus congestion headache not controlled with OTC meds   The history is provided by the patient.    Past Medical History  Diagnosis Date  . Diabetes mellitus   . Morbid obesity     Past Surgical History  Procedure Date  . Tubal ligation     No family history on file.  History  Substance Use Topics  . Smoking status: Never Smoker   . Smokeless tobacco: Not on file  . Alcohol Use: No    OB History    Grav Para Term Preterm Abortions TAB SAB Ect Mult Living                  Review of Systems  Constitutional: Negative for fever.  HENT: Positive for congestion and sinus pressure.   Eyes: Negative for visual disturbance.  Gastrointestinal: Negative for nausea.  Neurological: Positive for dizziness and headaches.    Allergies  Codeine  Home Medications   Current Outpatient Rx  Name  Route  Sig  Dispense  Refill  . INSULIN ASPART 100 UNIT/ML [place] SOLN   Subcutaneous   Inject 10 Units into the skin 2 (two) times daily before a meal. Inject 10 units twice daily plus sliding scale units if needed based on blood sugar.         . INSULIN GLARGINE 100 UNIT/ML [place] SOLN   Subcutaneous   Inject 10 Units into the skin at bedtime.          . ADULT MULTIVITAMIN W/MINERALS CH   Oral   Take 1 tablet by mouth daily.         Marland Kitchen VITAMIN C 250 MG PO TABS   Oral   Take 250 mg by mouth daily.         . AMOXICILLIN-POT CLAVULANATE 875-125 MG PO TABS   Oral   Take 1 tablet by mouth 2 (two) times daily.   13 tablet   0   . HYDROCODONE-ACETAMINOPHEN 5-325 MG PO TABS   Oral   Take 1 tablet by mouth every 6 (six) hours as needed for pain.   10 tablet   0   .  PSEUDOEPHEDRINE HCL ER 120 MG PO TB12   Oral   Take 1 tablet (120 mg total) by mouth every 12 (twelve) hours.   20 tablet   0   . SALINE 0.65 % NA SOLN   Nasal   Place 1 spray into the nose as needed for congestion.   1 Bottle   0     BP 120/65  Pulse 62  Temp 97.9 F (36.6 C) (Oral)  Resp 16  Ht 5\' 7"  (1.702 m)  Wt 320 lb (145.151 kg)  BMI 50.12 kg/m2  SpO2 95%  LMP 12/13/2011  Physical Exam  Constitutional: She is oriented to person, place, and time. She appears well-developed and well-nourished.  HENT:  Head: Normocephalic.  Right Ear: External ear normal.  Left Ear: External ear normal.  Nose: No mucosal edema. Right sinus exhibits maxillary sinus tenderness and frontal sinus tenderness. Left sinus exhibits maxillary sinus tenderness and frontal sinus tenderness.  Mouth/Throat: Oropharynx is clear and moist.  Eyes: Pupils are equal, round, and reactive to light.  Neck: Normal range of motion.  Cardiovascular: Normal rate.   Pulmonary/Chest: Effort normal.  Abdominal: Soft.  Musculoskeletal: Normal range of motion.  Neurological: She is alert and oriented to person, place, and time.  Skin: Skin is warm. No rash noted. No pallor.    ED Course  Procedures (including critical care time)  Labs Reviewed  BASIC METABOLIC PANEL - Abnormal; Notable for the following:    Sodium 134 (*)     Potassium 3.4 (*)     Glucose, Bld 171 (*)     All other components within normal limits  CBC - Abnormal; Notable for the following:    HCT 33.9 (*)     MCHC 36.6 (*)     All other components within normal limits  URINALYSIS, ROUTINE W REFLEX MICROSCOPIC - Abnormal; Notable for the following:    Hgb urine dipstick MODERATE (*)     All other components within normal limits  GLUCOSE, CAPILLARY - Abnormal; Notable for the following:    Glucose-Capillary 178 (*)     All other components within normal limits  PREGNANCY, URINE  URINE MICROSCOPIC-ADD ON  LAB REPORT - SCANNED    No results found.   1. Sinusitis     ED ECG REPORT   Date: 12/25/2011  EKG Time: 12:20 AM  Rate: 57  Rhythm: normal sinus rhythm,  unchanged from previous tracings  Axis: normal  Intervals:none  ST&T Change: non specific T wave abnormalities in the inferior leads   Narrative Interpretation: abnormality            MDM  sinusitis        Arman Filter, NP 12/16/11 2118  Arman Filter, NP 12/25/11 0020

## 2011-12-15 NOTE — ED Notes (Signed)
Pt presents with no acute distress.  HA x 3 days GCS 15 PEERL no neuro deficits.

## 2011-12-17 NOTE — ED Provider Notes (Signed)
Medical screening examination/treatment/procedure(s) were performed by non-physician practitioner and as supervising physician I was immediately available for consultation/collaboration.  Doug Sou, MD 12/17/11 [phone]

## 2011-12-29 NOTE — ED Provider Notes (Signed)
Medical screening examination/treatment/procedure(s) were performed by non-physician practitioner and as supervising physician I was immediately available for consultation/collaboration.  Doug Sou, MD 12/29/11 1257

## 2012-03-21 ENCOUNTER — Other Ambulatory Visit: Payer: Self-pay

## 2012-04-15 ENCOUNTER — Emergency Department (HOSPITAL_COMMUNITY)
Admission: EM | Admit: 2012-04-15 | Discharge: 2012-04-15 | Disposition: A | Discharge status: Home / Self Care | Payer: BC Managed Care – PPO | Attending: Emergency Medicine | Admitting: Emergency Medicine

## 2012-04-15 ENCOUNTER — Encounter (HOSPITAL_COMMUNITY): Payer: Self-pay | Admitting: *Deleted

## 2012-04-15 DIAGNOSIS — Z794 Long term (current) use of insulin: Secondary | ICD-10-CM | POA: Insufficient documentation

## 2012-04-15 DIAGNOSIS — Z79899 Other long term (current) drug therapy: Secondary | ICD-10-CM | POA: Insufficient documentation

## 2012-04-15 DIAGNOSIS — R0789 Other chest pain: Secondary | ICD-10-CM

## 2012-04-15 DIAGNOSIS — R071 Chest pain on breathing: Secondary | ICD-10-CM | POA: Insufficient documentation

## 2012-04-15 DIAGNOSIS — E119 Type 2 diabetes mellitus without complications: Secondary | ICD-10-CM | POA: Insufficient documentation

## 2012-04-15 MED ORDER — IBUPROFEN 800 MG PO TABS: 800.0000 mg | ORAL_TABLET | Freq: Three times a day (TID) | ORAL | Status: DC | PRN

## 2012-04-15 MED ORDER — HYDROCODONE-ACETAMINOPHEN 5-325 MG PO TABS
1.0000 | ORAL_TABLET | Freq: Once | ORAL | Status: AC
  Administered 2012-04-15: 1 via ORAL
  Filled 2012-04-15: qty 1

## 2012-04-15 MED ORDER — HYDROCODONE-ACETAMINOPHEN 5-325 MG PO TABS: 1.0000 | ORAL_TABLET | Freq: Four times a day (QID) | ORAL | Status: DC | PRN

## 2012-04-15 MED ORDER — IBUPROFEN 800 MG PO TABS
800.0000 mg | ORAL_TABLET | Freq: Once | ORAL | Status: AC
  Administered 2012-04-15: 800 mg via ORAL
  Filled 2012-04-15: qty 1

## 2012-04-15 NOTE — ED Provider Notes (Signed)
History     CSN: [id number]  Arrival date & time 04/15/12  1200   First MD Initiated Contact with Patient 04/15/12 1217      Chief Complaint  Patient presents with  . Chest Pain    (Consider location/radiation/quality/duration/timing/severity/associated sxs/prior treatment) HPI Patient presents to the emergency department with left sided chest, discomfort.  That's been constant since last night patient, states, that she has increased pain with deep breaths, movement, coughing, laughing and palpation.  Patient, states she did not take anything for her symptoms.  She states that when she presses on the area it does make it better if she holds it with deep pressure. Past Medical History  Diagnosis Date  . Diabetes mellitus   . Morbid obesity     Past Surgical History  Procedure Laterality Date  . Tubal ligation      No family history on file.  History  Substance Use Topics  . Smoking status: Never Smoker   . Smokeless tobacco: Not on file  . Alcohol Use: No    OB History   Grav Para Term Preterm Abortions TAB SAB Ect Mult Living                  Review of Systems All other systems negative except as documented in the HPI. All pertinent positives and negatives as reviewed in the HPI. Allergies  Codeine  Home Medications   Current Outpatient Rx  Name  Route  Sig  Dispense  Refill  . insulin aspart (NOVOLOG) 100 UNIT/ML injection   Subcutaneous   Inject 10 Units into the skin 2 (two) times daily before a meal. Inject 10 units twice daily plus sliding scale units if needed based on blood sugar.         . insulin glargine (LANTUS) 100 UNIT/ML injection   Subcutaneous   Inject 10 Units into the skin at bedtime.          . Multiple Vitamin (MULTIVITAMIN WITH MINERALS) TABS   Oral   Take 1 tablet by mouth daily.         . vitamin C (ASCORBIC ACID) 250 MG tablet   Oral   Take 250 mg by mouth daily.           BP 124/55  Pulse 79  Temp(Src) 98.3 F  (36.8 C) (Oral)  Resp 24  SpO2 99%  LMP 04/11/2012  Physical Exam  Nursing note and vitals reviewed. Constitutional: She is oriented to person, place, and time. She appears well-developed and well-nourished. No distress.  HENT:  Head: Normocephalic and atraumatic.  Mouth/Throat: Oropharynx is clear and moist.  Eyes: Pupils are equal, round, and reactive to light.  Neck: Normal range of motion. Neck supple.  Cardiovascular: Normal rate and regular rhythm.  Exam reveals no gallop and no friction rub.   No murmur heard. Pulmonary/Chest: Effort normal and breath sounds normal. No respiratory distress. She exhibits tenderness.    Neurological: She is alert and oriented to person, place, and time.  Skin: Skin is warm and dry.    ED Course  Procedures (including critical care time) Patient be treated for musculoskeletal chest pain, based on her history of present illness, and physical exam findings.  Patient had a history of this previously.  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  Patient, states, that palpation and movement, deep breathing, coughing and laughing, make the pain, worse.  Patient is PERC negative  MDM         Carlyle Dolly, PA-C 04/15/12 1344

## 2012-04-15 NOTE — ED Provider Notes (Signed)
Medical screening examination/treatment/procedure(s) were performed by non-physician practitioner and as supervising physician I was immediately available for consultation/collaboration.   Dione Booze, MD 04/15/12 [phone]

## 2012-04-15 NOTE — ED Notes (Signed)
Pt reports chest pain since last night. Felt like indigestion. Denies shob, n/v, diaphoresis. Pain only occurs with movement and feels better when she applies pressure to it. Pt denies pain when sitting still. Pain also occurs when she lies flat. Pt sts she has had this pain before and was told it was a pulled muscle.

## 2012-05-03 ENCOUNTER — Emergency Department (HOSPITAL_COMMUNITY)
Admission: EM | Admit: 2012-05-03 | Discharge: 2012-05-03 | Disposition: A | Discharge status: Home / Self Care | Payer: BC Managed Care – PPO | Attending: Emergency Medicine | Admitting: Emergency Medicine

## 2012-05-03 ENCOUNTER — Encounter (HOSPITAL_COMMUNITY): Payer: Self-pay

## 2012-05-03 DIAGNOSIS — Z79899 Other long term (current) drug therapy: Secondary | ICD-10-CM | POA: Insufficient documentation

## 2012-05-03 DIAGNOSIS — M7989 Other specified soft tissue disorders: Secondary | ICD-10-CM

## 2012-05-03 DIAGNOSIS — E8809 Other disorders of plasma-protein metabolism, not elsewhere classified: Secondary | ICD-10-CM | POA: Insufficient documentation

## 2012-05-03 DIAGNOSIS — R609 Edema, unspecified: Secondary | ICD-10-CM | POA: Insufficient documentation

## 2012-05-03 DIAGNOSIS — E119 Type 2 diabetes mellitus without complications: Secondary | ICD-10-CM | POA: Insufficient documentation

## 2012-05-03 DIAGNOSIS — Z794 Long term (current) use of insulin: Secondary | ICD-10-CM | POA: Insufficient documentation

## 2012-05-03 LAB — CBC WITH DIFFERENTIAL/PLATELET
Basophils Absolute: 0 10*3/uL (ref 0.0–0.1)
Eosinophils Relative: 2 % (ref 0–5)
Lymphocytes Relative: 31 % (ref 12–46)
Lymphs Abs: 1.9 10*3/uL (ref 0.7–4.0)
Neutrophils Relative %: 61 % (ref 43–77)
Platelets: 211 10*3/uL (ref 150–400)
RBC: 4.14 MIL/uL (ref 3.87–5.11)
RDW: 12.8 % (ref 11.5–15.5)
WBC: 6.1 10*3/uL (ref 4.0–10.5)

## 2012-05-03 LAB — BASIC METABOLIC PANEL
CO2: 25 mEq/L (ref 19–32)
Calcium: 8.7 mg/dL (ref 8.4–10.5)
GFR calc non Af Amer: >90 mL/min (ref >90)
Glucose, Bld: 307 mg/dL — ABNORMAL HIGH (ref 70–99)
Potassium: 3.7 mEq/L (ref 3.5–5.1)
Sodium: 133 mEq/L — ABNORMAL LOW (ref 135–145)

## 2012-05-03 LAB — HEPATIC FUNCTION PANEL
AST: 21 U/L (ref 0–37)
Albumin: 2.5 g/dL — ABNORMAL LOW (ref 3.5–5.2)
Alkaline Phosphatase: 167 U/L — ABNORMAL HIGH (ref 39–117)
Total Bilirubin: 0.4 mg/dL (ref 0.3–1.2)
Total Protein: 7.2 g/dL (ref 6.0–8.3)

## 2012-05-03 LAB — GLUCOSE, CAPILLARY: Glucose-Capillary: 243 mg/dL — ABNORMAL HIGH (ref 70–99)

## 2012-05-03 LAB — PRO B NATRIURETIC PEPTIDE: Pro B Natriuretic peptide (BNP): 61.2 pg/mL (ref 0–125)

## 2012-05-03 NOTE — ED Provider Notes (Signed)
History     CSN: [id number]  Arrival date & time 05/03/12  1253   First MD Initiated Contact with Patient 05/03/12 1507      Chief Complaint  Patient presents with  . swelling     (Consider location/radiation/quality/duration/timing/severity/associated sxs/prior treatment) HPI Comments: Pt with hx of IDDM comes in with cc of leg swelling and hand swelling x 2 weeks. Pt reports that she has had some leg swelling in the past, but they resolve with stockings. She has no hx of liver dz, heart dz, kidney dz. She also reports that both of her hands appears swollen to her, and there is mild discomfort. There is no hx of SLE, RA or any systemic inflammatory dz for her or her family. Pt has no DIB, chest pain, orthopnea, PND.  The history is provided by the patient and medical records.    Past Medical History  Diagnosis Date  . Diabetes mellitus   . Morbid obesity     Past Surgical History  Procedure Laterality Date  . Tubal ligation      No family history on file.  History  Substance Use Topics  . Smoking status: Never Smoker   . Smokeless tobacco: Not on file  . Alcohol Use: No    OB History   Grav Para Term Preterm Abortions TAB SAB Ect Mult Living                  Review of Systems  Constitutional: Positive for activity change.  HENT: Negative for neck pain.   Respiratory: Negative for cough and shortness of breath.   Cardiovascular: Negative for chest pain.  Gastrointestinal: Negative for nausea, vomiting and abdominal pain.  Genitourinary: Negative for dysuria.  Musculoskeletal: Negative for back pain and arthralgias.  Neurological: Negative for headaches.  Hematological: Negative for adenopathy. Does not bruise/bleed easily.    Allergies  Codeine  Home Medications   Current Outpatient Rx  Name  Route  Sig  Dispense  Refill  . HYDROcodone-acetaminophen (NORCO/VICODIN) 5-325 MG per tablet   Oral   Take 1 tablet by mouth every 6 (six) hours as needed for  pain.   15 tablet   0   . ibuprofen (ADVIL,MOTRIN) 800 MG tablet   Oral   Take 1 tablet (800 mg total) by mouth every 8 (eight) hours as needed for pain.   21 tablet   0   . insulin aspart (NOVOLOG) 100 UNIT/ML injection   Subcutaneous   Inject 10 Units into the skin 2 (two) times daily before a meal. Inject 10 units twice daily plus sliding scale units if needed based on blood sugar.         . insulin glargine (LANTUS) 100 UNIT/ML injection   Subcutaneous   Inject 10 Units into the skin at bedtime.          . Multiple Vitamin (MULTIVITAMIN WITH MINERALS) TABS   Oral   Take 1 tablet by mouth daily.         . vitamin C (ASCORBIC ACID) 250 MG tablet   Oral   Take 250 mg by mouth daily.           BP 121/65  Pulse 76  Temp(Src) 98.3 F (36.8 C) (Oral)  Resp 18  Ht 5\' 6"  (1.676 m)  Wt 308 lb (139.708 kg)  BMI 49.74 kg/m2  SpO2 100%  LMP 04/11/2012  Physical Exam  Nursing note and vitals reviewed. Constitutional: She is oriented to person, place, and  time. She appears well-developed and well-nourished.  HENT:  Head: Normocephalic and atraumatic.  Eyes: EOM are normal. Pupils are equal, round, and reactive to light.  Neck: Neck supple. No JVD present.  Cardiovascular: Normal rate, regular rhythm, normal heart sounds and intact distal pulses.   No murmur heard. Pulmonary/Chest: Effort normal. No respiratory distress.  Abdominal: Soft. She exhibits no distension. There is no tenderness. There is no rebound and no guarding.  Musculoskeletal: She exhibits edema.  Pt has 1+ pitting edema, extending to mid tib level bilaterally. No gross deformity of the hands, and patient is able to make a fist without any difficulty.   Neurological: She is alert and oriented to person, place, and time.  Skin: Skin is warm and dry.    ED Course  Procedures (including critical care time)  Labs Reviewed  GLUCOSE, CAPILLARY - Abnormal; Notable for the following:     Glucose-Capillary 243 (*)    All other components within normal limits  CBC WITH DIFFERENTIAL  BASIC METABOLIC PANEL  HEPATIC FUNCTION PANEL  PRO B NATRIURETIC PEPTIDE   No results found.   No diagnosis found.    MDM  Pt comes in with cc of leg swelling and hand pain. Exam not indicative of liver dz, CHF. We will get basic labs, check Cr, check albumin and get a BNP to r.o CHF. If the lab results are normal, we will recommend PCP follow up - as they might consider looking into Rheumatologic conditions.  Derwood Kaplan, MD 05/03/12 [phone]

## 2012-05-03 NOTE — ED Notes (Signed)
C/o swelling to hands, legs, feet x 3 days.  States CBG has been running "high, in 500's" since Thursday also but states it was 159 at 11am today.

## 2012-06-24 ENCOUNTER — Encounter (HOSPITAL_COMMUNITY): Payer: Self-pay

## 2012-06-24 ENCOUNTER — Emergency Department (HOSPITAL_COMMUNITY)
Admission: EM | Admit: 2012-06-24 | Discharge: 2012-06-24 | Disposition: A | Discharge status: Home / Self Care | Payer: BC Managed Care – PPO | Attending: Emergency Medicine | Admitting: Emergency Medicine

## 2012-06-24 DIAGNOSIS — E119 Type 2 diabetes mellitus without complications: Secondary | ICD-10-CM | POA: Insufficient documentation

## 2012-06-24 DIAGNOSIS — Z794 Long term (current) use of insulin: Secondary | ICD-10-CM | POA: Insufficient documentation

## 2012-06-24 DIAGNOSIS — L0291 Cutaneous abscess, unspecified: Secondary | ICD-10-CM

## 2012-06-24 DIAGNOSIS — L02219 Cutaneous abscess of trunk, unspecified: Secondary | ICD-10-CM | POA: Insufficient documentation

## 2012-06-24 DIAGNOSIS — Z79899 Other long term (current) drug therapy: Secondary | ICD-10-CM | POA: Insufficient documentation

## 2012-06-24 MED ORDER — SULFAMETHOXAZOLE-TRIMETHOPRIM 800-160 MG PO TABS: 1.0000 | ORAL_TABLET | Freq: Two times a day (BID) | ORAL | Status: DC

## 2012-06-24 MED ORDER — ONDANSETRON 8 MG PO TBDP: 8.0000 mg | ORAL_TABLET | Freq: Once | ORAL | Status: DC

## 2012-06-24 MED ORDER — OXYCODONE-ACETAMINOPHEN 5-325 MG PO TABS: 2.0000 | ORAL_TABLET | Freq: Four times a day (QID) | ORAL | Status: DC | PRN

## 2012-06-24 MED ORDER — OXYCODONE-ACETAMINOPHEN 5-325 MG PO TABS: 2.0000 | ORAL_TABLET | Freq: Once | ORAL | Status: DC

## 2012-06-24 NOTE — ED Provider Notes (Signed)
Medical screening examination/treatment/procedure(s) were performed by non-physician practitioner and as supervising physician I was immediately available for consultation/collaboration.   Richardean Canal, MD 06/24/12 2325

## 2012-06-24 NOTE — ED Provider Notes (Signed)
History    This chart was scribed for non-physician practitioner Junious Silk, PA-C working with Richardean Canal, MD by Smitty Pluck, ED scribe. This patient was seen in room WTR7/WTR7 and the patient's care was started at 5:15 PM.   CSN: [id number]  Arrival date & time 06/24/12  1633      Chief Complaint  Patient presents with  . Abscess    The history is provided by the patient and medical records. No language interpreter was used.   HPI Comments: [name] is a 42 y.o. female who presents to the Emergency Department complaining of lower right back abscess causing constant, moderate pain onset 1 day ago. Pt reports that symptoms started suddenly and have been gradually worsening. She reports hx of abscess 1 year ago. She reports that she has nausea and generalized body aches onset yesterday. She states she used warm compress without relief of pain. Pt denies fever, chills, vomiting, diarrhea, weakness, cough, SOB and any other pain.   Past Medical History  Diagnosis Date  . Diabetes mellitus   . Morbid obesity     Past Surgical History  Procedure Laterality Date  . Tubal ligation      No family history on file.  History  Substance Use Topics  . Smoking status: Never Smoker   . Smokeless tobacco: Not on file  . Alcohol Use: No    OB History   Grav Para Term Preterm Abortions TAB SAB Ect Mult Living                  Review of Systems  Constitutional: Negative for fever and chills.  Respiratory: Negative for cough and shortness of breath.   Gastrointestinal: Negative for nausea, vomiting and diarrhea.  Skin:       Abscess   Neurological: Negative for weakness.  All other systems reviewed and are negative.    Allergies  Codeine  Home Medications   Current Outpatient Rx  Name  Route  Sig  Dispense  Refill  . HYDROcodone-acetaminophen (NORCO/VICODIN) 5-325 MG per tablet   Oral   Take 1 tablet by mouth every 6 (six) hours as needed for pain.   15  tablet   0   . ibuprofen (ADVIL,MOTRIN) 800 MG tablet   Oral   Take 1 tablet (800 mg total) by mouth every 8 (eight) hours as needed for pain.   21 tablet   0   . insulin aspart (NOVOLOG) 100 UNIT/ML injection   Subcutaneous   Inject 10 Units into the skin 2 (two) times daily before a meal. Inject 10 units twice daily plus sliding scale units if needed based on blood sugar.         . insulin glargine (LANTUS) 100 UNIT/ML injection   Subcutaneous   Inject 10 Units into the skin at bedtime.          . Multiple Vitamin (MULTIVITAMIN WITH MINERALS) TABS   Oral   Take 1 tablet by mouth daily.         . vitamin C (ASCORBIC ACID) 250 MG tablet   Oral   Take 250 mg by mouth daily.           BP 111/65  Pulse 87  Temp(Src) 98.2 F (36.8 C) (Oral)  Resp 20  SpO2 100%  LMP 06/01/2012  Physical Exam  Nursing note and vitals reviewed. Constitutional: She is oriented to person, place, and time. She appears well-developed and well-nourished. No distress.  Morbidly obese  HENT:  Head: Normocephalic and atraumatic.  Right Ear: External ear normal.  Left Ear: External ear normal.  Nose: Nose normal.  Mouth/Throat: Oropharynx is clear and moist.  Eyes: Conjunctivae are normal.  Neck: Normal range of motion.  Cardiovascular: Normal rate, regular rhythm and normal heart sounds.   Pulmonary/Chest: Effort normal and breath sounds normal. No stridor. No respiratory distress. She has no wheezes. She has no rales.  Abdominal: Soft. She exhibits no distension.  Musculoskeletal: Normal range of motion.  Neurological: She is alert and oriented to person, place, and time. She has normal strength.  Skin: Skin is warm and dry. She is not diaphoretic. No erythema.  4 cm area of induration and erythema with central fluctuance   Psychiatric: She has a normal mood and affect. Her behavior is normal.    ED Course  Procedures (including critical care time) DIAGNOSTIC STUDIES: Oxygen  Saturation is 100% on room air, room air by my interpretation.    COORDINATION OF CARE: 5:17 PM Discussed ED treatment with pt and pt agrees.   INCISION AND DRAINAGE Performed by: Caryn Bee Courts Consent: Verbal consent obtained. Risks and benefits: risks, benefits and alternatives were discussed Type: abscess  Body area: right back  Anesthesia: refused  Incision was made with a scalpel.  Local anesthetic: refused  Complexity: complex Blunt dissection to break up loculations  Drainage: purulent  Drainage amount: copious  Patient tolerance: Patient tolerated the procedure well with no immediate complications.      Labs Reviewed - No data to display No results found.   1. Abscess       MDM  Patient presents with an abscess on her right back. The abscess was drained without complication. Discussed 15 minutes soaks in clean water, otherwise keep the wound dry and clean. She was given Bactrim for MRSA coverage. Strict return instructions given. Vital signs stable for discharge. Patient / Family / Caregiver informed of clinical course, understand medical decision-making process, and agree with plan.      I personally performed the services described in this documentation, which was scribed in my presence. The recorded information has been reviewed and is accurate.     Mora Bellman, PA-C 06/24/12 2025

## 2012-06-24 NOTE — ED Notes (Signed)
Pt c/o abscess to rt mid back/side area since sunday

## 2012-06-29 ENCOUNTER — Emergency Department (HOSPITAL_COMMUNITY)
Admission: EM | Admit: 2012-06-29 | Discharge: 2012-06-30 | Disposition: A | Discharge status: Home / Self Care | Payer: BC Managed Care – PPO | Attending: Emergency Medicine | Admitting: Emergency Medicine

## 2012-06-29 ENCOUNTER — Encounter (HOSPITAL_COMMUNITY): Payer: Self-pay | Admitting: *Deleted

## 2012-06-29 DIAGNOSIS — M549 Dorsalgia, unspecified: Secondary | ICD-10-CM | POA: Insufficient documentation

## 2012-06-29 DIAGNOSIS — M5430 Sciatica, unspecified side: Secondary | ICD-10-CM | POA: Insufficient documentation

## 2012-06-29 DIAGNOSIS — E119 Type 2 diabetes mellitus without complications: Secondary | ICD-10-CM | POA: Insufficient documentation

## 2012-06-29 DIAGNOSIS — IMO0001 Reserved for inherently not codable concepts without codable children: Secondary | ICD-10-CM | POA: Insufficient documentation

## 2012-06-29 DIAGNOSIS — M7918 Myalgia, other site: Secondary | ICD-10-CM

## 2012-06-29 DIAGNOSIS — M5432 Sciatica, left side: Secondary | ICD-10-CM

## 2012-06-29 DIAGNOSIS — Z794 Long term (current) use of insulin: Secondary | ICD-10-CM | POA: Insufficient documentation

## 2012-06-29 MED ORDER — PERMETHRIN 5 % EX CREA: TOPICAL_CREAM | CUTANEOUS | Status: DC

## 2012-06-29 MED ORDER — CYCLOBENZAPRINE HCL 10 MG PO TABS
10.0000 mg | ORAL_TABLET | Freq: Once | ORAL | Status: AC
  Administered 2012-06-30: 10 mg via ORAL
  Filled 2012-06-29: qty 1

## 2012-06-29 MED ORDER — CYCLOBENZAPRINE HCL 10 MG PO TABS: 10.0000 mg | ORAL_TABLET | Freq: Two times a day (BID) | ORAL | Status: DC | PRN

## 2012-06-29 MED ORDER — PREDNISONE 10 MG PO TABS: 20.0000 mg | ORAL_TABLET | Freq: Every day | ORAL | Status: DC

## 2012-06-29 MED ORDER — HYDROXYZINE HCL 25 MG PO TABS: 25.0000 mg | ORAL_TABLET | Freq: Four times a day (QID) | ORAL | Status: DC

## 2012-06-29 MED ORDER — TRAMADOL HCL 50 MG PO TABS
50.0000 mg | ORAL_TABLET | Freq: Once | ORAL | Status: AC
  Administered 2012-06-30: 50 mg via ORAL
  Filled 2012-06-29: qty 1

## 2012-06-29 NOTE — ED Provider Notes (Signed)
History    This chart was scribed for Vinetta Bergamo, a non-physician practitioner working with No att. providers found by Lewanda Rife, ED Scribe. This patient was seen in room WTR7/WTR7 and the patient's care was started at 2342.      CSN: [id number]  Arrival date & time 06/29/12  2055   First MD Initiated Contact with Patient 06/29/12 2235      Chief Complaint  Patient presents with  . Sciatica    (Consider location/radiation/quality/duration/timing/severity/associated sxs/prior treatment) The history is provided by the patient.   HPI Comments: [name] is a 42 y.o. female who presents to the Emergency Department complaining of exacerbation of chronic sciatica onset acute 2 days ago. Reports non-radiating constant moderate pain in left buttock. Denies recent injury, weakness, change in activity, paraesthesias, chest pain, and shortness of breath. Reports pain is aggravated when standing and alleviated by nothing. Reports taking 800 mg of Ibuprofen PTA with no relief of symptoms. Denies hx of slip disc.     Past Medical History  Diagnosis Date  . Diabetes mellitus   . Morbid obesity     Past Surgical History  Procedure Laterality Date  . Tubal ligation      No family history on file.  History  Substance Use Topics  . Smoking status: Never Smoker   . Smokeless tobacco: Not on file  . Alcohol Use: No    OB History   Grav Para Term Preterm Abortions TAB SAB Ect Mult Living                  Review of Systems  Constitutional: Negative for fever, chills, diaphoresis and fatigue.  HENT: Negative for sore throat, trouble swallowing, neck pain and neck stiffness.   Eyes: Negative for photophobia, pain and visual disturbance.  Respiratory: Negative for choking, chest tightness and shortness of breath.   Cardiovascular: Negative for chest pain.  Gastrointestinal: Negative for nausea, vomiting, diarrhea, constipation and blood in stool.   Genitourinary: Negative for urgency, hematuria and difficulty urinating.  Musculoskeletal: Positive for back pain.  Skin: Negative for rash and wound.  Neurological: Negative for dizziness, weakness, light-headedness, numbness and headaches.  All other systems reviewed and are negative.   A complete 10 system review of systems was obtained and all systems are negative except as noted in the HPI and PMH.    Allergies  Codeine  Home Medications   Current Outpatient Rx  Name  Route  Sig  Dispense  Refill  . cyclobenzaprine (FLEXERIL) 10 MG tablet   Oral   Take 1 tablet (10 mg total) by mouth 2 (two) times daily as needed for muscle spasms.   20 tablet   0   . insulin aspart (NOVOLOG) 100 UNIT/ML injection   Subcutaneous   Inject 10 Units into the skin 2 (two) times daily before a meal. Inject 10 units twice daily plus sliding scale units if needed based on blood sugar.         . insulin glargine (LANTUS) 100 UNIT/ML injection   Subcutaneous   Inject 10 Units into the skin at bedtime.          . Multiple Vitamin (MULTIVITAMIN WITH MINERALS) TABS   Oral   Take 1 tablet by mouth daily.         Marland Kitchen oxyCODONE-acetaminophen (PERCOCET/ROXICET) 5-325 MG per tablet   Oral   Take 2 tablets by mouth every 6 (six) hours as needed for pain.   6 tablet  0   . predniSONE (DELTASONE) 10 MG tablet   Oral   Take 2 tablets (20 mg total) by mouth daily.   10 tablet   0   . sulfamethoxazole-trimethoprim (SEPTRA DS) 800-160 MG per tablet   Oral   Take 1 tablet by mouth every 12 (twelve) hours.   20 tablet   0     BP 123/99  Pulse 93  Temp(Src) 98.3 F (36.8 C) (Oral)  Resp 16  Ht 5\' 6"  (1.676 m)  Wt 308 lb (139.708 kg)  BMI 49.74 kg/m2  SpO2 99%  LMP 06/01/2012  Physical Exam  Nursing note and vitals reviewed. Constitutional: She is oriented to person, place, and time. She appears well-developed and well-nourished. No distress.  Morbidly obese   HENT:  Head:  Normocephalic and atraumatic.  Mouth/Throat: Oropharynx is clear and moist. No oropharyngeal exudate.  Eyes: Conjunctivae and EOM are normal. Pupils are equal, round, and reactive to light. Right eye exhibits no discharge. Left eye exhibits no discharge.  Neck: Normal range of motion. Neck supple. No tracheal deviation present.  Negative neck stiffness Negative lymphadenopathy Negative nuchal rigidity Negative tenderness upon palpation to cervical spinous processes  Cardiovascular: Normal rate, regular rhythm, normal heart sounds and normal pulses.  Exam reveals no friction rub.   No murmur heard. Pulses:      Radial pulses are 2+ on the right side, and 2+ on the left side.  Pulmonary/Chest: Effort normal and breath sounds normal. No respiratory distress. She has no wheezes. She has no rales.  Musculoskeletal: Normal range of motion. She exhibits tenderness. She exhibits no edema.       Back:  Lymphadenopathy:    She has no cervical adenopathy.  Neurological: She is alert and oriented to person, place, and time. She has normal strength. No cranial nerve deficit or sensory deficit.  Cranial nerves II-XII intact. 5/5 strength of lower extremities. Negative dull and sharp sensation.   Skin: Skin is warm and dry. No bruising, no ecchymosis, no lesion and no rash noted. No erythema.  No warmth to touch to left buttock or left buttock.   Psychiatric: She has a normal mood and affect. Her behavior is normal.    ED Course  Procedures (including critical care time) Medications  traMADol (ULTRAM) tablet 50 mg (50 mg Oral Given 06/30/12 0008)  cyclobenzaprine (FLEXERIL) tablet 10 mg (10 mg Oral Given 06/30/12 0008)    Labs Reviewed - No data to display No results found.   1. Sciatica, left   2. Left buttock pain   3. Morbid obesity       MDM  I personally performed the services described in this documentation, which was scribed in my presence. The recorded information has been reviewed  and is accurate.  Patient afebrile, normotensive, non-tachycardic, no tachypnea, adequate saturation on room air, alert and oriented. Patient presenting to ED with pain to the left hip/buttock region - stated that she had an episode exactly like this 3 years ago. Reported discomfort to be a burning sensation - stated worse when she gets up from a sitting position, worse when applying pressure and weight to the left leg. Patient stated that she has done nothing for the pain. Pain controlled in ED setting. Patient able to stand with proper balance. Limited ROM to the left leg secondary to pain. No neurovascular damage noted. Negative erythema, inflammation, swelling, warmth noted to left hip - r/o septic joint. Suspected sciatica with musculoskeletal discomfort. Patient aseptic, non-toxic appearing,  in no acute distress. Patient discharged - discharged with flexeril and prednisone - discussed course and precautions with patient. Referred patient to neurology and [facility] for follow-up. Discussed with patient to rest and stay hydrated. Discussed with patient to refrain from physical and strenuous activity. Discussed with patient to monitor symptoms and if symptoms are to worsen or change to report back to the ED. Patient agreed to plan of care, understood, all questions answered.   Raymon Mutton, PA-C 06/30/12 [phone]

## 2012-06-29 NOTE — ED Notes (Signed)
Pt states that she has sciatica several years ago that was painful to left buttock and down left leg; pt states that she began to have the same pain 2 days ago and that it has just been progressive.

## 2012-06-30 NOTE — ED Notes (Signed)
rx x 2 given for flexeril and prednisone- pt has family here to drive

## 2012-07-02 NOTE — ED Provider Notes (Signed)
Medical screening examination/treatment/procedure(s) were performed by non-physician practitioner and as supervising physician I was immediately available for consultation/collaboration.   Nelia Shi, MD 07/02/12 [phone]

## 2012-07-14 ENCOUNTER — Ambulatory Visit (INDEPENDENT_AMBULATORY_CARE_PROVIDER_SITE_OTHER): Payer: BC Managed Care – PPO | Admitting: Internal Medicine

## 2012-07-14 ENCOUNTER — Encounter: Payer: Self-pay | Admitting: Internal Medicine

## 2012-07-14 VITALS — BP 128/86 | HR 67 | Temp 98.0°F | Resp 14 | Ht 66.0 in | Wt 313.4 lb

## 2012-07-14 DIAGNOSIS — R5383 Other fatigue: Secondary | ICD-10-CM

## 2012-07-14 DIAGNOSIS — IMO0001 Reserved for inherently not codable concepts without codable children: Secondary | ICD-10-CM

## 2012-07-14 DIAGNOSIS — R5381 Other malaise: Secondary | ICD-10-CM

## 2012-07-14 DIAGNOSIS — M899 Disorder of bone, unspecified: Secondary | ICD-10-CM

## 2012-07-14 MED ORDER — INSULIN ASPART 100 UNIT/ML ~~LOC~~ SOLN: 10.0000 [IU] | Freq: Two times a day (BID) | SUBCUTANEOUS | Status: DC

## 2012-07-14 MED ORDER — INSULIN GLARGINE 100 UNIT/ML ~~LOC~~ SOLN: 15.0000 [IU] | Freq: Every day | SUBCUTANEOUS | Status: DC

## 2012-07-14 NOTE — Progress Notes (Signed)
Subjective:    Patient ID: [name], female    DOB: 06-Jun-1970, 42 y.o.   MRN: [id number]  HPI  42 y/o female patient is here to establish care She was seeing Dr Della Goo for her primary medical problems but has not seen her for a long time- several years due to insurance problem. [facility]. Used to be on lexapro for mood disorder with mood swings and depression. Not taking it anymore because she did not feel she needed it anymore Mood is fair now. Has occassional mood swings It has been several months since she got her sugar checked She sees her obgyn for her pap smear and breast exam. Has upcoming appointment on 07/22/12 uptodate with flu vaccine in 2013 and pneumococcal vaccine 01/04/2006  Review of Systems  Constitutional: Positive for appetite change. Negative for fever and chills.  HENT: Negative for sore throat, rhinorrhea, mouth sores and neck pain.   Eyes: Negative for visual disturbance.  Respiratory: Negative for cough, shortness of breath and wheezing.   Cardiovascular: Positive for leg swelling. Negative for chest pain and palpitations.       Ted hose helpful with leg swelling  Gastrointestinal: Negative for nausea, vomiting, abdominal pain, diarrhea and constipation.  Endocrine: Positive for polyphagia.  Genitourinary: Negative for dysuria, vaginal bleeding, vaginal discharge, menstrual problem and pelvic pain.       Not sexually active, has regular menstural cycles  Musculoskeletal: Negative for back pain, joint swelling and arthralgias.  Skin: Negative for rash.       Gets frequent boils and blisters and now when she gets them it takes for ever to heal  Neurological: Negative for dizziness, weakness and light-headedness.  Hematological: Negative for adenopathy.  Psychiatric/Behavioral: Negative for suicidal ideas, behavioral problems, sleep disturbance and agitation. The patient is not nervous/anxious.        Objective:   Physical Exam  Constitutional: She is oriented to person, place, and time. No distress.  Morbidly obese  HENT:  Head: Normocephalic and atraumatic.  Nose: Nose normal.  Mouth/Throat: Oropharynx is clear and moist. No oropharyngeal exudate.  Eyes: Conjunctivae and EOM are normal. Pupils are equal, round, and reactive to light.  Neck: Normal range of motion. Neck supple. No JVD present. No tracheal deviation present. No thyromegaly present.  Cardiovascular: Normal rate, regular rhythm and intact distal pulses.   No murmur heard. Pulmonary/Chest: Effort normal and breath sounds normal. No respiratory distress. She has no wheezes. She has no rales. She exhibits no tenderness.  Breast exam not done, will see gyn  Abdominal: Soft. Bowel sounds are normal. She exhibits no distension and no mass. There is no tenderness. There is no rebound and no guarding.  Genitourinary:  Seeing her gyn in 1 week  Musculoskeletal: Normal range of motion. She exhibits no edema and no tenderness.  Lymphadenopathy:    She has no cervical adenopathy.  Neurological: She is alert and oriented to person, place, and time. She has normal reflexes. She displays normal reflexes. No cranial nerve deficit. She exhibits normal muscle tone. Coordination normal.  Skin: Skin is warm and dry. No rash noted. She is not diaphoretic. No erythema.  Psychiatric: She has a normal mood and affect. Her behavior is normal. Judgment normal.    BP 128/86  Pulse 67  Temp(Src) 98 F (36.7 C) (Oral)  Resp 14  Ht 5\' 6"  (1.676 m)  Wt 313 lb 6.4 oz (142.157 kg)  BMI 50.61 kg/m2  LMP  06/01/2012     Labs reviewed-  CBC    Component Value Date/Time   WBC 6.1 05/03/2012 1608   RBC 4.14 05/03/2012 1608   HGB 12.2 05/03/2012 1608   HCT 33.7* 05/03/2012 1608   PLT 211 05/03/2012 1608   MCV 81.4 05/03/2012 1608   MCH 29.5 05/03/2012 1608   MCHC 36.2* 05/03/2012 1608   RDW 12.8 05/03/2012 1608   LYMPHSABS 1.9 05/03/2012 1608    MONOABS 0.4 05/03/2012 1608   EOSABS 0.1 05/03/2012 1608   BASOSABS 0.0 05/03/2012 1608    CMP     Component Value Date/Time   NA 133* 05/03/2012 1608   K 3.7 05/03/2012 1608   CL 100 05/03/2012 1608   CO2 25 05/03/2012 1608   GLUCOSE 307* 05/03/2012 1608   BUN 10 05/03/2012 1608   CREATININE 0.51 05/03/2012 1608   CALCIUM 8.7 05/03/2012 1608   PROT 7.2 05/03/2012 1608   ALBUMIN 2.5* 05/03/2012 1608   AST 21 05/03/2012 1608   ALT 22 05/03/2012 1608   ALKPHOS 167* 05/03/2012 1608   BILITOT 0.4 05/03/2012 1608   GFRNONAA >90 05/03/2012 1608   GFRAA >90 05/03/2012 1608   Lipid Panel  No results found for this basename: chol, trig, hdl, cholhdl, vldl, ldlcalc    Assessment & Plan:   No prior records for review from patient's PCP office  Diabetes mellitus type 2 uncontrolled-  Will check a1c today. Pt to check cbg 4 times at home and will have her on lantus 15 u (increased) and change her novolog to 10 u three times a day with meals only for cbg > 250. Will review labs and consider ACEI, ASA and statin next visit  Morbid obesity- spent > 20 minutes counselling patient on importance of diet and exercise. Pt willing to try exercising with brisk walk everyday. Needs weight loss. Check lipid panel, a1c, cbc, cmp  Fatigue- rule out thyroid abnormality. Her lifestyle with lack of activity could be the main cause for easy fatigue  Back pain- resolved for now. Off prednisone and flexeril. Back precuations reinforced.

## 2012-07-14 NOTE — Patient Instructions (Addendum)
Check blood sugar four times a day.  Take lantus 15 u daily at bedtime  Take novolog 10 u three times a day only for blood sugar > 250  Diabetes Meal Planning Guide The diabetes meal planning guide is a tool to help you plan your meals and snacks. It is important for people with diabetes to manage their blood glucose (sugar) levels. Choosing the right foods and the right amounts throughout your day will help control your blood glucose. Eating right can even help you improve your blood pressure and reach or maintain a healthy weight. CARBOHYDRATE COUNTING MADE EASY When you eat carbohydrates, they turn to sugar. This raises your blood glucose level. Counting carbohydrates can help you control this level so you feel better. When you plan your meals by counting carbohydrates, you can have more flexibility in what you eat and balance your medicine with your food intake. Carbohydrate counting simply means adding up the total amount of carbohydrate grams in your meals and snacks. Try to eat about the same amount at each meal. Foods with carbohydrates are listed below. Each portion below is 1 carbohydrate serving or 15 grams of carbohydrates. Ask your dietician how many grams of carbohydrates you should eat at each meal or snack. Grains and Starches  1 slice bread.   English muffin or hotdog/hamburger bun.   cup cold cereal (unsweetened).   cup cooked pasta or rice.   cup starchy vegetables (corn, potatoes, peas, beans, winter squash).  1 tortilla (6 inches).   bagel.  1 waffle or pancake (size of a CD).   cup cooked cereal.  4 to 6 small crackers. *Whole grain is recommended. Fruit  1 cup fresh unsweetened berries, melon, papaya, pineapple.  1 small fresh fruit.   banana or mango.   cup fruit juice (4 oz unsweetened).   cup canned fruit in natural juice or water.  2 tbs dried fruit.  12 to 15 grapes or cherries. Milk and Yogurt  1 cup fat-free or 1% milk.  1 cup  soy milk.  6 oz light yogurt with sugar-free sweetener.  6 oz low-fat soy yogurt.  6 oz plain yogurt. Vegetables  1 cup raw or  cup cooked is counted as 0 carbohydrates or a "free" food.  If you eat 3 or more servings at 1 meal, count them as 1 carbohydrate serving. Other Carbohydrates   oz chips or pretzels.   cup ice cream or frozen yogurt.   cup sherbet or sorbet.  2 inch square cake, no frosting.  1 tbs honey, sugar, jam, jelly, or syrup.  2 small cookies.  3 squares of graham crackers.  3 cups popcorn.  6 crackers.  1 cup broth-based soup.  Count 1 cup casserole or other mixed foods as 2 carbohydrate servings.  Foods with less than 20 calories in a serving may be counted as 0 carbohydrates or a "free" food. You may want to purchase a book or computer software that lists the carbohydrate gram counts of different foods. In addition, the nutrition facts panel on the labels of the foods you eat are a good source of this information. The label will tell you how big the serving size is and the total number of carbohydrate grams you will be eating per serving. Divide this number by 15 to obtain the number of carbohydrate servings in a portion. Remember, 1 carbohydrate serving equals 15 grams of carbohydrate. SERVING SIZES Measuring foods and serving sizes helps you make sure you are getting  the right amount of food. The list below tells how big or small some common serving sizes are.  1 oz.........4 stacked dice.  3 oz........Marland KitchenDeck of cards.  1 tsp.......Marland KitchenTip of little finger.  1 tbs......Marland KitchenMarland KitchenThumb.  2 tbs.......Marland KitchenGolf ball.   cup......Marland KitchenHalf of a fist.  1 cup.......Marland KitchenA fist. SAMPLE DIABETES MEAL PLAN Below is a sample meal plan that includes foods from the grain and starches, dairy, vegetable, fruit, and meat groups. A dietician can individualize a meal plan to fit your calorie needs and tell you the number of servings needed from each food group. However,  controlling the total amount of carbohydrates in your meal or snack is more important than making sure you include all of the food groups at every meal. You may interchange carbohydrate containing foods (dairy, starches, and fruits). The meal plan below is an example of a 2000 calorie diet using carbohydrate counting. This meal plan has 17 carbohydrate servings. Breakfast  1 cup oatmeal (2 carb servings).   cup light yogurt (1 carb serving).  1 cup blueberries (1 carb serving).   cup almonds. Snack  1 large apple (2 carb servings).  1 low-fat string cheese stick. Lunch  Chicken breast salad.  1 cup spinach.   cup chopped tomatoes.  2 oz chicken breast, sliced.  2 tbs low-fat [place] dressing.  12 whole-wheat crackers (2 carb servings).  12 to 15 grapes (1 carb serving).  1 cup low-fat milk (1 carb serving). Snack  1 cup carrots.   cup hummus (1 carb serving). Dinner  3 oz broiled salmon.  1 cup brown rice (3 carb servings). Snack  1  cups steamed broccoli (1 carb serving) drizzled with 1 tsp olive oil and lemon juice.  1 cup light pudding (2 carb servings). DIABETES MEAL PLANNING WORKSHEET Your dietician can use this worksheet to help you decide how many servings of foods and what types of foods are right for you.  BREAKFAST Food Group and Servings / Carb Servings Grain/Starches __________________________________ Dairy __________________________________________ Vegetable ______________________________________ Fruit ___________________________________________ Meat __________________________________________ Fat ____________________________________________ LUNCH Food Group and Servings / Carb Servings Grain/Starches ___________________________________ Dairy ___________________________________________ Fruit ____________________________________________ Meat ___________________________________________ Fat  _____________________________________________ [name] Food Group and Servings / Carb Servings Grain/Starches ___________________________________ Dairy ___________________________________________ Fruit ____________________________________________ Meat ___________________________________________ Fat _____________________________________________ SNACKS Food Group and Servings / Carb Servings Grain/Starches ___________________________________ Dairy ___________________________________________ Vegetable _______________________________________ Fruit ____________________________________________ Meat ___________________________________________ Fat _____________________________________________ DAILY TOTALS Starches _________________________ Vegetable ________________________ Fruit ____________________________ Dairy ____________________________ Meat ____________________________ Fat ______________________________ Document Released: 10/18/2004 Document Revised: 04/15/2011 Document Reviewed: 08/29/2008 [employer] Patient Information 2014 [employer]

## 2012-07-15 DIAGNOSIS — M899 Disorder of bone, unspecified: Secondary | ICD-10-CM | POA: Insufficient documentation

## 2012-07-15 DIAGNOSIS — R5383 Other fatigue: Secondary | ICD-10-CM | POA: Insufficient documentation

## 2012-07-15 DIAGNOSIS — R5381 Other malaise: Secondary | ICD-10-CM | POA: Insufficient documentation

## 2012-07-15 DIAGNOSIS — E119 Type 2 diabetes mellitus without complications: Secondary | ICD-10-CM | POA: Insufficient documentation

## 2012-07-16 LAB — CBC WITH DIFFERENTIAL/PLATELET
Basophils Absolute: 0 10*3/uL (ref 0.0–0.2)
Eosinophils Absolute: 0 10*3/uL (ref 0.0–0.4)
HCT: 38.1 % (ref 34.0–46.6)
Hemoglobin: 13.7 g/dL (ref 11.1–15.9)
Lymphocytes Absolute: 1.7 10*3/uL (ref 0.7–3.1)
MCH: 30 pg (ref 26.6–33.0)
MCHC: 36 g/dL — ABNORMAL HIGH (ref 31.5–35.7)
MCV: 84 fL (ref 79–97)
Monocytes Absolute: 0.4 10*3/uL (ref 0.1–0.9)
Neutrophils Absolute: 2.4 10*3/uL (ref 1.4–7.0)
RDW: 13.7 % (ref 12.3–15.4)

## 2012-07-16 LAB — COMPREHENSIVE METABOLIC PANEL
Albumin/Globulin Ratio: 0.9 — ABNORMAL LOW (ref 1.1–2.5)
Calcium: 9.6 mg/dL (ref 8.7–10.2)
Creatinine, Ser: 0.59 mg/dL (ref 0.57–1.00)
GFR calc Af Amer: 130 mL/min/{1.73_m2} (ref >59)
GFR calc non Af Amer: 113 mL/min/{1.73_m2} (ref >59)
Globulin, Total: 4 g/dL (ref 1.5–4.5)
Glucose: 293 mg/dL — ABNORMAL HIGH (ref 65–99)
Total Bilirubin: 0.5 mg/dL (ref 0.0–1.2)
Total Protein: 7.6 g/dL (ref 6.0–8.5)

## 2012-07-16 LAB — HEMOGLOBIN A1C: Hgb A1c MFr Bld: 11.8 % — ABNORMAL HIGH (ref 4.8–5.6)

## 2012-07-16 LAB — LIPID PANEL
Chol/HDL Ratio: 1.9 ratio units (ref 0.0–4.4)
LDL Calculated: 73 mg/dL (ref 0–99)

## 2012-08-12 ENCOUNTER — Ambulatory Visit: Payer: BC Managed Care – PPO | Admitting: Internal Medicine

## 2012-09-08 ENCOUNTER — Encounter: Payer: Self-pay | Admitting: Internal Medicine

## 2012-09-08 ENCOUNTER — Ambulatory Visit (INDEPENDENT_AMBULATORY_CARE_PROVIDER_SITE_OTHER): Payer: BC Managed Care – PPO | Admitting: Internal Medicine

## 2012-09-08 VITALS — BP 148/86 | HR 68 | Temp 98.3°F | Resp 14 | Ht 66.0 in | Wt 325.0 lb

## 2012-09-08 DIAGNOSIS — IMO0001 Reserved for inherently not codable concepts without codable children: Secondary | ICD-10-CM

## 2012-09-08 MED ORDER — METFORMIN HCL 500 MG PO TABS: 500.0000 mg | ORAL_TABLET | Freq: Two times a day (BID) | ORAL | Status: DC

## 2012-09-08 MED ORDER — INSULIN ASPART 100 UNIT/ML ~~LOC~~ SOLN: 5.0000 [IU] | Freq: Three times a day (TID) | SUBCUTANEOUS | Status: DC

## 2012-09-08 MED ORDER — INSULIN GLARGINE 100 UNIT/ML ~~LOC~~ SOLN: 20.0000 [IU] | Freq: Every day | SUBCUTANEOUS | Status: DC

## 2012-09-08 NOTE — Progress Notes (Signed)
Patient ID: [name], female   DOB: [birth year], 42 y.o.   MRN: [id number]  Chief Complaint  Patient presents with  . Medical Managment of Chronic Issues   HPI 42 y/o female patient is here for follow up. She has hx of dm, morbid obesity and OA. She has been compliant with her insulin- lantus 15 u and novolog 10 u for cbg > 250 Reviewed home cbg- 190-300 mostly with few > 300. She mentions that her sugar was 68 last evening. She felt jittery at that point but that is the only low reading. This am cbg was 200.  Reviewed her labs. a1c > 11 On reviewing her weight she has gained 12 lbs since last visit She has cut down on oil, sweets and fried food She boils her vegetable and grills her meat Has not seen an eye doctor recently in more than a year  Of note: She sees her obgyn for her pap smear and breast exam. Has upcoming appointment on 07/22/12 uptodate with flu vaccine in 2013 and pneumococcal vaccine 01/04/2006  Review of Systems  Constitutional:  Negative for fever and chills.  Eyes: Negative for visual disturbance.  Respiratory: Negative for cough, shortness of breath and wheezing.   Cardiovascular: Positive for leg swelling. Negative for chest pain and palpitations.       Ted hose helpful with leg swelling  Gastrointestinal: Negative for nausea, vomiting, abdominal pain, diarrhea and constipation.  Endocrine: Positive for polyphagia.  Genitourinary: Negative for dysuria, vaginal bleeding, vaginal discharge, menstrual problem and pelvic pain.        Not sexually active, has regular menstural cycles  Musculoskeletal: Negative for back pain, joint swelling and arthralgias.  Skin: Negative for rash.        Gets frequent boils and blisters and now when she gets them it takes for ever to heal  Neurological: Negative for dizziness, weakness and light-headedness.  Psychiatric/Behavioral: Negative for suicidal ideas, behavioral problems, sleep disturbance and agitation. The patient  is not nervous/anxious.    Allergies  Allergen Reactions  . Codeine Shortness Of Breath    Physical exam- BP 148/86  Pulse 68  Temp(Src) 98.3 F (36.8 C) (Oral)  Resp 14  Ht 5\' 6"  (1.676 m)  Wt 325 lb (147.419 kg)  BMI 52.48 kg/m2  LMP 08/31/2012  Constitutional: She is oriented to person, place, and time. No distress.  Morbidly obese  Cardiovascular: Normal rate, regular rhythm and intact distal pulses.    No murmur heard. Pulmonary/Chest: Effort normal and breath sounds normal. No respiratory distress. She has no wheezes. She has no rales. She exhibits no tenderness.  Abdominal: Soft. Bowel sounds are normal. She exhibits no distension and no mass. There is no tenderness. There is no rebound and no guarding.  Musculoskeletal: Normal range of motion. She exhibits trace edema and no tenderness.  Neurological: She is alert and oriented to person, place, and time. Normal microfilament and vibration test Skin: Skin is warm and dry. No rash noted. She is not diaphoretic. No erythema.  Psychiatric: She has a normal mood and affect. Her behavior is normal. Judgment normal.    Labs-  CBC    Component Value Date/Time   WBC 4.5 07/14/2012 1109   WBC 6.1 05/03/2012 1608   RBC 4.56 07/14/2012 1109   RBC 4.14 05/03/2012 1608   HGB 13.7 07/14/2012 1109   HCT 38.1 07/14/2012 1109   PLT 211 05/03/2012 1608   MCV 84 07/14/2012 1109   MCH 30.0 07/14/2012  1109   MCH 29.5 05/03/2012 1608   MCHC 36.0* 07/14/2012 1109   MCHC 36.2* 05/03/2012 1608   RDW 13.7 07/14/2012 1109   RDW 12.8 05/03/2012 1608   LYMPHSABS 1.7 07/14/2012 1109   LYMPHSABS 1.9 05/03/2012 1608   MONOABS 0.4 05/03/2012 1608   EOSABS 0.0 07/14/2012 1109   EOSABS 0.1 05/03/2012 1608   BASOSABS 0.0 07/14/2012 1109   BASOSABS 0.0 05/03/2012 1608    CMP     Component Value Date/Time   NA 137 07/14/2012 1109   NA 133* 05/03/2012 1608   K 4.8 07/14/2012 1109   CL 98 07/14/2012 1109   CO2 24 07/14/2012 1109   GLUCOSE 293* 07/14/2012 1109    GLUCOSE 307* 05/03/2012 1608   BUN 11 07/14/2012 1109   BUN 10 05/03/2012 1608   CREATININE 0.59 07/14/2012 1109   CALCIUM 9.6 07/14/2012 1109   PROT 7.6 07/14/2012 1109   PROT 7.2 05/03/2012 1608   ALBUMIN 2.5* 05/03/2012 1608   AST 33 07/14/2012 1109   ALT 31 07/14/2012 1109   ALKPHOS 181* 07/14/2012 1109   BILITOT 0.5 07/14/2012 1109   GFRNONAA 113 07/14/2012 1109   GFRAA 130 07/14/2012 1109   tsh 4.10  a1c 11.8  Vit d 111.4  Lipid Panel     Component Value Date/Time   TRIG 57 07/14/2012 1109   HDL 95 07/14/2012 1109   CHOLHDL 1.9 07/14/2012 1109   LDLCALC 73 07/14/2012 1109    Assessment/plan  Diabetes mellitus type 2 uncontrolled-  elevated a1c and elevated home blood sugar reading. Will increase her lantus to 20 u for now and add metformin 500 mg bid- good candidate for metformin given her obesity and polyphagia. Will change aspart to 5 u premeal tid for cbg > 200. Normal lipid panel. Will check urine microalbumin level. Her bp is well controlled. If has microalbuminuria, will consider starting ACEI for helping with renal remodelling. Pt to see eye doctor. Normal foot exam  Morbid obesity- counselled patient on importance of diet and exercise. Will provide nutrition referral for diabetic diet education nd follow up given her recent further 10 lbs weight gain

## 2012-09-09 LAB — MICROALBUMIN / CREATININE URINE RATIO
MICROALB/CREAT RATIO: <4.1 mg/g creat (ref 0.0–30.0)
Microalbumin, Urine: <3 ug/mL (ref 0.0–17.0)

## 2012-11-02 ENCOUNTER — Encounter (HOSPITAL_COMMUNITY): Payer: Self-pay

## 2012-11-02 ENCOUNTER — Emergency Department (HOSPITAL_COMMUNITY)
Admission: EM | Admit: 2012-11-02 | Discharge: 2012-11-02 | Disposition: A | Discharge status: Home / Self Care | Payer: BC Managed Care – PPO | Attending: Emergency Medicine | Admitting: Emergency Medicine

## 2012-11-02 DIAGNOSIS — Z79899 Other long term (current) drug therapy: Secondary | ICD-10-CM | POA: Insufficient documentation

## 2012-11-02 DIAGNOSIS — F3289 Other specified depressive episodes: Secondary | ICD-10-CM | POA: Insufficient documentation

## 2012-11-02 DIAGNOSIS — F329 Major depressive disorder, single episode, unspecified: Secondary | ICD-10-CM | POA: Insufficient documentation

## 2012-11-02 DIAGNOSIS — IMO0002 Reserved for concepts with insufficient information to code with codable children: Secondary | ICD-10-CM | POA: Insufficient documentation

## 2012-11-02 DIAGNOSIS — Z794 Long term (current) use of insulin: Secondary | ICD-10-CM | POA: Insufficient documentation

## 2012-11-02 DIAGNOSIS — E119 Type 2 diabetes mellitus without complications: Secondary | ICD-10-CM | POA: Insufficient documentation

## 2012-11-02 DIAGNOSIS — L02411 Cutaneous abscess of right axilla: Secondary | ICD-10-CM

## 2012-11-02 LAB — GLUCOSE, CAPILLARY: Glucose-Capillary: 174 mg/dL — ABNORMAL HIGH (ref 70–99)

## 2012-11-02 MED ORDER — HYDROCODONE-ACETAMINOPHEN 5-325 MG PO TABS: 2.0000 | ORAL_TABLET | ORAL | Status: DC | PRN

## 2012-11-02 MED ORDER — CLINDAMYCIN HCL 150 MG PO CAPS: 300.0000 mg | ORAL_CAPSULE | Freq: Three times a day (TID) | ORAL | Status: DC

## 2012-11-02 NOTE — ED Notes (Signed)
Pt c/o abscess in R axilla.  Pain score 8/10.  Sts "I put Blue Star cream on it and it came to a head, but yellow and green stuff started coming out.  Now, it's red around it."

## 2012-11-02 NOTE — ED Provider Notes (Signed)
CSN: [id number]     Arrival date & time 11/02/12  1141 History  This chart was scribed for non-physician practitioner Coral Ceo, PA-C working with Lyanne Co, MD by Joaquin Music, ED Scribe. This patient was seen in room WTR6/WTR6 and the patient's care was started at 2:00 PM .  Chief Complaint  Patient presents with  . Abscess   The history is provided by the patient. No language interpreter was used.   HPI Comments: [name] is a 42 y.o. female who presents to the Emergency Department complaining of a worsening right axilla abscess onset 3 days. Pt rates her current pain is 8/10. Pt applied blue star ointment and states she had green drainage PTA. Pt has history of abscess. She previously had an abscess on lower back. Pt is a diabetic and takes insulin. Pt denies numbness and tingling in right arm and right hand. Pt denies taking anything OTC PTA. No hx of previous wound to axilla.  Pt denies fever, vomiting, sore throat, and chest pain.  Past Medical History  Diagnosis Date  . Diabetes mellitus   . Morbid obesity   . Depression    Past Surgical History  Procedure Laterality Date  . Tubal ligation     Family History  Problem Relation Age of Onset  . Diabetes Mother   . Lymphoma Father   . Lymphoma Brother    History  Substance Use Topics  . Smoking status: Never Smoker   . Smokeless tobacco: Never Used  . Alcohol Use: No   OB History   Grav Para Term Preterm Abortions TAB SAB Ect Mult Living                 Review of Systems  Constitutional: Negative for fever, chills, activity change, appetite change and fatigue.  HENT: Negative for congestion, sore throat, rhinorrhea and neck pain.   Eyes: Negative for visual disturbance.  Respiratory: Negative for cough and shortness of breath.   Cardiovascular: Negative for chest pain.  Gastrointestinal: Negative for nausea, vomiting and abdominal pain.  Genitourinary: Negative for dysuria.   Musculoskeletal: Negative for back pain.  Skin: Positive for color change. Negative for wound.  Neurological: Negative for headaches.    Allergies  Codeine  Home Medications   Current Outpatient Rx  Name  Route  Sig  Dispense  Refill  . insulin aspart (NOVOLOG) 100 UNIT/ML injection   Subcutaneous   Inject 5 Units into the skin 3 (three) times daily with meals.         . insulin glargine (LANTUS) 100 UNIT/ML injection   Subcutaneous   Inject 20 Units into the skin at bedtime.         . metFORMIN (GLUCOPHAGE) 500 MG tablet   Oral   Take 500 mg by mouth 2 (two) times daily with a meal.         . Multiple Vitamin (MULTIVITAMIN WITH MINERALS) TABS   Oral   Take 1 tablet by mouth daily.          BP 129/89  Pulse 72  Temp(Src) 98.4 F (36.9 C) (Oral)  Resp 18  SpO2 100%  LMP 10/26/2012  Filed Vitals:   11/02/12 1205 11/02/12 1432  BP: 129/89 127/82  Pulse: 72 76  Temp: 98.4 F (36.9 C) 98.6 F (37 C)  TempSrc: Oral Oral  Resp: 18 18  SpO2: 100% 99%     Physical Exam  Nursing note and vitals reviewed. Constitutional: She is oriented to  person, place, and time. She appears well-developed and well-nourished. No distress.  HENT:  Head: Normocephalic and atraumatic.  Right Ear: External ear normal.  Left Ear: External ear normal.  Eyes: Conjunctivae and EOM are normal. Right eye exhibits no discharge. Left eye exhibits no discharge.  Neck: Normal range of motion. Neck supple. No tracheal deviation present.  Cardiovascular: Normal rate, regular rhythm and normal heart sounds.  Exam reveals no gallop and no friction rub.   No murmur heard. Radial pulses present bilaterally  Pulmonary/Chest: Effort normal and breath sounds normal. No respiratory distress. She has no wheezes. She has no rales. She exhibits no tenderness.  Abdominal: Soft. She exhibits no distension. There is no tenderness.  Musculoskeletal: Normal range of motion. She exhibits tenderness.  She exhibits no edema.  Strength 5/5 in the upper extremities  Neurological: She is alert and oriented to person, place, and time.  Sensation intact  Skin: Skin is warm and dry. She is not diaphoretic. There is erythema.  Fluctuant 3 cm x 2 cm abscess in the right axilla with no open wounds.  Large 6 cm x 5 cm underlying area of induration with overlying erythema underlying abscess.    Psychiatric: She has a normal mood and affect. Her behavior is normal.    ED Course  INCISION AND DRAINAGE Date/Time: 11/02/2012 2:30 PM Performed by: Coral Ceo K Authorized by: Jillyn Ledger Consent: Verbal consent obtained. Consent given by: patient Patient identity confirmed: verbally with patient Type: abscess Body area: upper extremity Location details: right arm Anesthesia: local infiltration Local anesthetic: lidocaine 1% without epinephrine Anesthetic total: 5 ml Patient sedated: no Scalpel size: 11 Incision type: single straight Complexity: simple Drainage: purulent Drainage amount: moderate Packing material: wick placed Patient tolerance: Patient tolerated the procedure well with no immediate complications.    DIAGNOSTIC STUDIES: Oxygen Saturation is 100% on RA, normal by my interpretation.    COORDINATION OF CARE: 2:03 PM-Discussed treatment plan which includes draining abscess, prescribing antibiotics and pain medication. Pt agreed to plan.   2:08 PM-Pt declined pain medication prior to abscess drainage.    2:10 PM- Drained and packed abscess. Advised pt to avoid detergent and other creams to area. F/U with PCP. F/U with surgeon for further treatment. Return to ED if redness or swelling.  Labs Review Labs Reviewed - No data to display Imaging Review No results found.  Results for orders placed during the hospital encounter of 11/02/12  GLUCOSE, CAPILLARY      Result Value Range   Glucose-Capillary 174 (*) 70 - 99 mg/dL    MDM   1. Abscess of right axilla      [name] is a 42 y.o. female who presents to the Emergency Department complaining of a worsening right axilla abscess onset 3 days. Patient declined pain medications.      Abscess drained in the ED.  Patient will be tx with Clindamycin and Vicodin.  Patient was instructed to return to the ED if they experience any fever, spreading erythema/edema, weakness, drainage, or other concerns.  Patient given referral to general surgery for further evaluation and management. Patient was in agreement with discharge and plan.     Final impressions: 1. Abscess right axilla     [name]   I personally performed the services described in this documentation, which was scribed in my presence. The recorded information has been reviewed and is accurate.     Jillyn Ledger, PA-C 11/03/12 [phone]

## 2012-11-04 ENCOUNTER — Encounter: Payer: Self-pay | Admitting: Dietician

## 2012-11-04 ENCOUNTER — Encounter: Payer: BC Managed Care – PPO | Attending: Internal Medicine | Admitting: Dietician

## 2012-11-04 VITALS — Ht 67.0 in | Wt 319.9 lb

## 2012-11-04 DIAGNOSIS — IMO0002 Reserved for concepts with insufficient information to code with codable children: Secondary | ICD-10-CM | POA: Insufficient documentation

## 2012-11-04 DIAGNOSIS — Z713 Dietary counseling and surveillance: Secondary | ICD-10-CM | POA: Insufficient documentation

## 2012-11-04 DIAGNOSIS — IMO0001 Reserved for inherently not codable concepts without codable children: Secondary | ICD-10-CM

## 2012-11-04 DIAGNOSIS — E1165 Type 2 diabetes mellitus with hyperglycemia: Secondary | ICD-10-CM | POA: Insufficient documentation

## 2012-11-04 NOTE — ED Provider Notes (Signed)
Medical screening examination/treatment/procedure(s) were performed by non-physician practitioner and as supervising physician I was immediately available for consultation/collaboration.  Lyanne Co, MD 11/04/12 [phone]

## 2012-11-04 NOTE — Progress Notes (Signed)
Appt start time: 1030 end time:  1200.   Assessment:  Patient was seen on  11/04/12 for individual diabetes education. 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 Reports losing 100 pounds in one year about 10 years (was 412 pounds) and hasn't regained it. Test blood sugar 4 x day since morning blood sugar is 118 in morning on average, 120 around noon, 345-415 in afternoon.   Was doing [employer] last year and going to the gym and lost 25 pounds. Stopped when she pulled a muscle in her back  Current HbA1c: 11.8 on 07/14/12  MEDICATIONS: see list   DIETARY INTAKE:   24-hr recall:  B ( AM): skips  Snk ( AM): none  L ( PM): healthy choice meal such as chicken salad, lettuce, with grapes, tomatoes with water  Snk ( PM): none D ( PM): sausage and french fries with water or diet soda (rarely regular soda) Snk ( PM): none Beverages: water  Usual physical activity: nothing  Estimated energy needs: 1800 calories 200 g carbohydrates 135 g protein 50 g fat  Progress Towards Goal(s):  In progress.   Nutritional Diagnosis:  [place]-2.1 Impaired nutrition utilization As related to carbohydrate metabolism.  As evidenced by HgBA1C of 11.8.    Intervention:  Nutrition counseling provided.  Discussed diabetes disease process and treatment options.  Discussed physiology of diabetes and role of obesity on insulin resistance.  Encouraged moderate weight reduction to improve glucose levels.  Discussed role of medications and diet in glucose control  Provided education on macronutrients on glucose levels.  Provided education on carb counting, importance of regularly scheduled meals/snacks, and meal planning  Discussed effects of physical activity on glucose levels and long-term glucose control.  Recommended 150 minutes of physical activity/week.  Reviewed patient medications.  Discussed role of medication on blood glucose and possible side effects  Discussed blood glucose monitoring and interpretation.  Discussed  recommended target ranges and individual ranges.    Described short-term complications: hyper- and hypo-glycemia.  Discussed causes,symptoms, and treatment options.  Discussed prevention, detection, and treatment of long-term complications.  Discussed the role of prolonged elevated glucose levels on body systems.  Discussed role of stress on blood glucose levels and discussed strategies to manage psychosocial issues.  Discussed recommendations for long-term diabetes self-care.  Established checklist for medical, dental, and emotional self-care.  Handouts given during visit include:  Living Well With Diabetes  Yellow Card  15g CHO Snacks  MyPlate   Self care checklist  Blood Glucose Monitoring  Carb Counting and Meal Planning  Barriers to learning/adherance to lifestyle change: none  Diabetes self-care support plan:   [facility] support group  Family  Monitoring/Evaluation:  Dietary intake, exercise, and body weight in 2 month(s).

## 2012-11-04 NOTE — Patient Instructions (Addendum)

## 2012-11-05 ENCOUNTER — Ambulatory Visit (INDEPENDENT_AMBULATORY_CARE_PROVIDER_SITE_OTHER): Payer: BC Managed Care – PPO | Admitting: Nurse Practitioner

## 2012-11-05 ENCOUNTER — Encounter: Payer: Self-pay | Admitting: Nurse Practitioner

## 2012-11-05 VITALS — BP 120/76 | HR 68 | Temp 98.8°F | Wt 322.0 lb

## 2012-11-05 DIAGNOSIS — L02411 Cutaneous abscess of right axilla: Secondary | ICD-10-CM

## 2012-11-05 DIAGNOSIS — Z23 Encounter for immunization: Secondary | ICD-10-CM

## 2012-11-05 DIAGNOSIS — IMO0002 Reserved for concepts with insufficient information to code with codable children: Secondary | ICD-10-CM

## 2012-11-05 NOTE — Progress Notes (Signed)
Patient ID: [name], female   DOB: 1970/04/13, 42 y.o.   MRN: [id number]   Allergies  Allergen Reactions  . Codeine Shortness Of Breath    Chief Complaint  Patient presents with  . Hospitalization Follow-up    hospital follow up & remove packing  . Immunizations    needs Tdap & Pneumo given 2009-2010 @ [facility]  . other    FYI: (per pt) never had mammogram    HPI: Patient is a 42 y.o. female seen in the office today for follow up visit to the EM due to abscess.  pt here today to follow up right axilla abscess and have packing removed; hospital ED recommended surgery consult. She was told to come here today to make sure it was healing; taking clindamycin 2 tablets three times daily; no side effects noted from the medications  Pt reports abscess is a lot better; redness, tenderness and edema has all gone down. Pt reports overall she feels better; does not see the need to go to see surgery at this point (due to the fact it has already gotten so much better) No fevers or chills, no malaise or fatigue, swelling tenderness and redness minimal    Review of Systems:  Review of Systems  Constitutional: Negative for fever, chills and malaise/fatigue.  Respiratory: Negative for shortness of breath.   Cardiovascular: Negative for chest pain.  Skin:       abcess to right axilla; no pain (has not needed PRN Vicodin), small amt of purulent drainage  Neurological: Negative for dizziness, sensory change, focal weakness, weakness and headaches.     Past Medical History  Diagnosis Date  . Diabetes mellitus   . Morbid obesity   . Depression    Past Surgical History  Procedure Laterality Date  . Tubal ligation     Social History:   reports that she has never smoked. She has never used smokeless tobacco. She reports that she does not drink alcohol or use illicit drugs.  Family History  Problem Relation Age of Onset  . Diabetes Mother   . Lymphoma Father   .  Lymphoma Brother     Medications: Patient's Medications  New Prescriptions   No medications on file  Previous Medications   CLINDAMYCIN (CLEOCIN) 150 MG CAPSULE    Take 2 capsules (300 mg total) by mouth 3 (three) times daily. May dispense as 150mg  capsules   HYDROCODONE-ACETAMINOPHEN (NORCO/VICODIN) 5-325 MG PER TABLET    Take 2 tablets by mouth every 4 (four) hours as needed for pain.   INSULIN ASPART (NOVOLOG) 100 UNIT/ML INJECTION    Inject 5 Units into the skin 3 (three) times daily with meals.   INSULIN GLARGINE (LANTUS) 100 UNIT/ML INJECTION    Inject 10 Units into the skin at bedtime.    METFORMIN (GLUCOPHAGE) 500 MG TABLET    Take 500 mg by mouth 2 (two) times daily with a meal.   MULTIPLE VITAMIN (MULTIVITAMIN WITH MINERALS) TABS    Take 1 tablet by mouth daily.  Modified Medications   No medications on file  Discontinued Medications   No medications on file     Physical Exam:  Filed Vitals:   11/05/12 1102  BP: 120/76  Pulse: 68  Temp: 98.8 F (37.1 C)  TempSrc: Oral  Weight: 322 lb (146.058 kg)  SpO2: 98%    Physical Exam  Constitutional: She is oriented to person, place, and time and well-developed, well-nourished, and in no distress. No distress.  HENT:  Head: Normocephalic and atraumatic.  Neck: Normal range of motion. Neck supple. No thyromegaly present.  Cardiovascular: Normal rate, regular rhythm and normal heart sounds.   Pulmonary/Chest: Effort normal and breath sounds normal. No respiratory distress.  Abdominal: Soft. Bowel sounds are normal.  Musculoskeletal: Normal range of motion. She exhibits no edema and no tenderness.  Lymphadenopathy:    She has no cervical adenopathy.  Neurological: She is alert and oriented to person, place, and time.  Skin: Skin is warm. She is not diaphoretic. There is erythema (to right axilla with open site with wick; purlent drainage).  Psychiatric: Affect normal.    Labs reviewed: Basic Metabolic Panel:  Recent  Labs  12/15/11 2102 05/03/12 1608 07/14/12 1109  NA 134* 133* 137  K 3.4* 3.7 4.8  CL 99 100 98  CO2 27 25 24   GLUCOSE 171* 307* 293*  BUN 15 10 11   CREATININE 0.59 0.51 0.59  CALCIUM 9.2 8.7 9.6  TSH  --   --  4.100   Liver Function Tests:  Recent Labs  05/03/12 1608 07/14/12 1109  AST 21 33  ALT 22 31  ALKPHOS 167* 181*  BILITOT 0.4 0.5  PROT 7.2 7.6  ALBUMIN 2.5*  --    No results found for this basename: LIPASE, AMYLASE,  in the last 8760 hours No results found for this basename: AMMONIA,  in the last 8760 hours CBC:  Recent Labs  12/15/11 2102 05/03/12 1608 07/14/12 1109  WBC 4.5 6.1 4.5  NEUTROABS  --  3.7 2.4  HGB 12.4 12.2 13.7  HCT 33.9* 33.7* 38.1  MCV 81.5 81.4 84  PLT 247 211  --    Lipid Panel:  Recent Labs  07/14/12 1109  HDL 95  LDLCALC 73  TRIG 57  CHOLHDL 1.9       Assessment/Plan  2. Abscess of right axilla Abscess drained in the ED; wick removed in office today; still with small amount of purulent drainage; per pt much better; pt to cont Clindamycin. Ongoing education for Patient to return to the ED if she experience any fever, spreading erythema/edema, weakness, drainage, or other concerns. pt agrees Pt to follow up early next week to recheck axilla and drainage and would re-evaluate surgery referral at that time she she does not wish to go now.

## 2012-11-05 NOTE — Patient Instructions (Signed)
Cont antibiotics as prescribed  Take probiotic twice daily while on antibiotic  Eat low sugar yogurt daily  If your conditioned worsens seek medial attention for further instruction (and possible drainage)   Follow up with on Monday

## 2012-11-11 ENCOUNTER — Encounter: Payer: Self-pay | Admitting: Nurse Practitioner

## 2012-11-11 ENCOUNTER — Ambulatory Visit (INDEPENDENT_AMBULATORY_CARE_PROVIDER_SITE_OTHER): Payer: BC Managed Care – PPO | Admitting: Nurse Practitioner

## 2012-11-11 VITALS — BP 116/70 | HR 75 | Temp 97.8°F | Wt 327.0 lb

## 2012-11-11 DIAGNOSIS — L02411 Cutaneous abscess of right axilla: Secondary | ICD-10-CM

## 2012-11-11 DIAGNOSIS — IMO0002 Reserved for concepts with insufficient information to code with codable children: Secondary | ICD-10-CM

## 2012-11-11 NOTE — Progress Notes (Signed)
Patient ID: [name], female   DOB: 1970/10/09, 42 y.o.   MRN: [id number]   Allergies  Allergen Reactions  . Codeine Shortness Of Breath    Chief Complaint  Patient presents with  . Medical Managment of Chronic Issues    1 week f/u  . Immunizations    needs Tdap & Pnuemo Vaccines today    HPI: Patient is a 42 y.o. female seen in the office today for follow up right axilla abscess; completed clindamycin course. Reports she feels great. No fevers or chills. Area under arm is very small and still getting smaller. Pt without tenderness or pain. No open area. No heat or redness  Review of Systems:  Review of Systems  Constitutional: Negative for fever, chills and malaise/fatigue.  Respiratory: Negative for shortness of breath.   Cardiovascular: Negative for chest pain.  Gastrointestinal: Negative for diarrhea and constipation.  Musculoskeletal: Negative for myalgias.  Skin:       abcess to right axilla has improved significantly; no pain, swelling, redness  Neurological: Negative for dizziness, sensory change, focal weakness, weakness and headaches.     Past Medical History  Diagnosis Date  . Diabetes mellitus   . Morbid obesity   . Depression    Past Surgical History  Procedure Laterality Date  . Tubal ligation     Social History:   reports that she has never smoked. She has never used smokeless tobacco. She reports that she does not drink alcohol or use illicit drugs.  Family History  Problem Relation Age of Onset  . Diabetes Mother   . Lymphoma Father   . Lymphoma Brother     Medications: Patient's Medications  New Prescriptions   No medications on file  Previous Medications   CLINDAMYCIN (CLEOCIN) 150 MG CAPSULE    Take 2 capsules (300 mg total) by mouth 3 (three) times daily. May dispense as 150mg  capsules   HYDROCODONE-ACETAMINOPHEN (NORCO/VICODIN) 5-325 MG PER TABLET    Take 2 tablets by mouth every 4 (four) hours as needed for pain.   INSULIN  ASPART (NOVOLOG) 100 UNIT/ML INJECTION    Inject 5 Units into the skin 3 (three) times daily with meals.   INSULIN GLARGINE (LANTUS) 100 UNIT/ML INJECTION    Inject 10 Units into the skin at bedtime.    METFORMIN (GLUCOPHAGE) 500 MG TABLET    Take 500 mg by mouth 2 (two) times daily with a meal.   MULTIPLE VITAMIN (MULTIVITAMIN WITH MINERALS) TABS    Take 1 tablet by mouth daily.  Modified Medications   No medications on file  Discontinued Medications   No medications on file     Physical Exam: Constitutional: She is oriented to person, place, and time and well-developed, well-nourished, and in no distress. No distress.  HENT:  Head: Normocephalic and atraumatic.  Neck: Normal range of motion. Neck supple. No thyromegaly present.  Cardiovascular: Normal rate, regular rhythm and normal heart sounds.  Pulmonary/Chest: Effort normal and breath sounds normal. No respiratory distress.  Abdominal: Soft. Bowel sounds are normal.  Musculoskeletal: Normal range of motion. She exhibits no edema and no tenderness.  Lymphadenopathy:  She has no cervical adenopathy.  Neurological: She is alert and oriented to person, place, and time.  Skin: Skin is warm. She is not diaphoretic. There is no redness, swelling, or tenderness. Small pea sized area noted.  Psychiatric: Affect normal.   Filed Vitals:   11/11/12 1016  BP: 116/70  Pulse: 75  Temp: 97.8 F (36.6 C)  TempSrc: Oral  Weight: 327 lb (148.326 kg)  SpO2: 99%    Labs reviewed: Basic Metabolic Panel:  Recent Labs  28/41/32 2102 05/03/12 1608 07/14/12 1109  NA 134* 133* 137  K 3.4* 3.7 4.8  CL 99 100 98  CO2 27 25 24   GLUCOSE 171* 307* 293*  BUN 15 10 11   CREATININE 0.59 0.51 0.59  CALCIUM 9.2 8.7 9.6  TSH  --   --  4.100   Liver Function Tests:  Recent Labs  05/03/12 1608 07/14/12 1109  AST 21 33  ALT 22 31  ALKPHOS 167* 181*  BILITOT 0.4 0.5  PROT 7.2 7.6  ALBUMIN 2.5*  --    No results found for this basename:  LIPASE, AMYLASE,  in the last 8760 hours No results found for this basename: AMMONIA,  in the last 8760 hours CBC:  Recent Labs  12/15/11 2102 05/03/12 1608 07/14/12 1109  WBC 4.5 6.1 4.5  NEUTROABS  --  3.7 2.4  HGB 12.4 12.2 13.7  HCT 33.9* 33.7* 38.1  MCV 81.5 81.4 84  PLT 247 211  --    Lipid Panel:  Recent Labs  07/14/12 1109  HDL 95  LDLCALC 73  TRIG 57  CHOLHDL 1.9     Assessment/Plan Abscess of right axilla  Resolved; to seek medical attention if another area appears not to wait  Does not need surgery consult at this time.  Cont to use dial soap   To keep follow up with Dr Sander Radon Also pt has not had mammogram will need to investigate further at next visit and to schedule if pt is wanting one.

## 2012-12-14 ENCOUNTER — Other Ambulatory Visit: Payer: BC Managed Care – PPO

## 2012-12-16 ENCOUNTER — Ambulatory Visit: Payer: BC Managed Care – PPO | Admitting: Internal Medicine

## 2012-12-16 DIAGNOSIS — Z0289 Encounter for other administrative examinations: Secondary | ICD-10-CM

## 2013-01-04 ENCOUNTER — Ambulatory Visit: Payer: BC Managed Care – PPO | Admitting: Dietician

## 2013-01-26 ENCOUNTER — Ambulatory Visit (INDEPENDENT_AMBULATORY_CARE_PROVIDER_SITE_OTHER): Payer: BC Managed Care – PPO | Admitting: Internal Medicine

## 2013-01-26 ENCOUNTER — Encounter: Payer: Self-pay | Admitting: Internal Medicine

## 2013-01-26 VITALS — BP 120/70 | HR 84 | Temp 97.7°F | Wt 323.2 lb

## 2013-01-26 DIAGNOSIS — F32A Depression, unspecified: Secondary | ICD-10-CM | POA: Insufficient documentation

## 2013-01-26 DIAGNOSIS — F329 Major depressive disorder, single episode, unspecified: Secondary | ICD-10-CM

## 2013-01-26 DIAGNOSIS — R7401 Elevation of levels of liver transaminase levels: Secondary | ICD-10-CM

## 2013-01-26 DIAGNOSIS — F3289 Other specified depressive episodes: Secondary | ICD-10-CM

## 2013-01-26 DIAGNOSIS — E119 Type 2 diabetes mellitus without complications: Secondary | ICD-10-CM | POA: Insufficient documentation

## 2013-01-26 MED ORDER — INSULIN GLARGINE 100 UNIT/ML ~~LOC~~ SOLN: 15.0000 [IU] | Freq: Every day | SUBCUTANEOUS | Status: DC

## 2013-01-26 MED ORDER — SERTRALINE HCL 50 MG PO TABS: 50.0000 mg | ORAL_TABLET | Freq: Every day | ORAL | Status: DC

## 2013-01-26 MED ORDER — METFORMIN HCL 500 MG PO TABS: 500.0000 mg | ORAL_TABLET | Freq: Two times a day (BID) | ORAL | Status: DC

## 2013-01-26 MED ORDER — INSULIN ASPART 100 UNIT/ML ~~LOC~~ SOLN: 5.0000 [IU] | Freq: Three times a day (TID) | SUBCUTANEOUS | Status: DC

## 2013-01-26 NOTE — Progress Notes (Signed)
Patient ID: [name], female   DOB: 07/20/1970, 42 y.o.   MRN: [id number]  Chief Complaint  Patient presents with  . Medical Managment of Chronic Issues    3 month f/u   Allergies  Allergen Reactions  . Codeine Shortness Of Breath   HPI 42 y/o female t here for follow up. She had missed her last appointment. She has uncontrolled DM. No recent labs cbg in am 250 and above. Otherwise it is less than 200. She feels low these days. She has lost interest in what she liked to do before. She would want to sleep all the time. She feels getting short with people easily. She has hx of depression and was on wellbutrin and zoloft in the past. She used to go to behavioral health and she had self stopped the medication after feeling better 3-4 years back. She had been doing well without medication until recently  Review of Systems  Constitutional: Negative for fever, chills, malaise/fatigue and diaphoresis. has lost 5 lbs since last visit. HENT: Negative for congestion, hearing loss and sore throat.   Eyes: Negative for blurred vision, double vision and discharge.  Respiratory: Negative for cough, sputum production, shortness of breath and wheezing.   Cardiovascular: Negative for chest pain, palpitations, orthopnea and leg swelling.  Gastrointestinal: Negative for heartburn, nausea, vomiting, abdominal pain, diarrhea and constipation.  Genitourinary: Negative for dysuria, urgency, frequency and flank pain.  Musculoskeletal: Negative for back pain, falls, joint pain and myalgias.  Skin: Negative for itching and rash.  Neurological: Negative for dizziness, tingling, focal weakness and headaches.  Psychiatric/Behavioral: Negative for  memory loss. Has occassional anxiety.  no insomnia  Past Medical History  Diagnosis Date  . Diabetes mellitus   . Morbid obesity   . Depression     Medication reviewed. See [facility]  Past Surgical History  Procedure Laterality Date  . Tubal ligation       Physical exam BP 120/70  Pulse 84  Temp(Src) 97.7 F (36.5 C) (Oral)  Wt 323 lb 3.2 oz (146.603 kg)  SpO2 98%  LMP 01/22/2013  General- elderly female in no acute distress, obese Head- atraumatic, normocephalic Eyes- PERRLA, EOMI, no pallor, no icterus, no discharge Neck- no lymphadenopathy, no thyromegaly, no jugular vein distension, no carotid bruit Cardiovascular- normal s1,s2, no murmurs/ rubs/ gallops Respiratory- bilateral clear to auscultation, no wheeze, no rhonchi, no crackles Abdomen- bowel sounds present, soft, non tender Musculoskeletal- able to move all 4 extremities, no spinal and paraspinal tenderness, steady gait, no use of assistive device Neurological- no focal deficit, normal reflexes, normal muscle strength, normal sensation to fine touch and vibration Psychiatry- alert and oriented to person, place and time, tearful   Lab Results  Component Value Date   HGBA1C 11.8* 07/14/2012    Lipid Panel     Component Value Date/Time   TRIG 57 07/14/2012 1109   HDL 95 07/14/2012 1109   CHOLHDL 1.9 07/14/2012 1109   LDLCALC 73 07/14/2012 1109   CBC    Component Value Date/Time   WBC 4.5 07/14/2012 1109   WBC 6.1 05/03/2012 1608   RBC 4.56 07/14/2012 1109   RBC 4.14 05/03/2012 1608   HGB 13.7 07/14/2012 1109   HCT 38.1 07/14/2012 1109   PLT 211 05/03/2012 1608   MCV 84 07/14/2012 1109   MCH 30.0 07/14/2012 1109   MCH 29.5 05/03/2012 1608   MCHC 36.0* 07/14/2012 1109   MCHC 36.2* 05/03/2012 1608   RDW 13.7 07/14/2012 1109  RDW 12.8 05/03/2012 1608   LYMPHSABS 1.7 07/14/2012 1109   LYMPHSABS 1.9 05/03/2012 1608   MONOABS 0.4 05/03/2012 1608   EOSABS 0.0 07/14/2012 1109   EOSABS 0.1 05/03/2012 1608   BASOSABS 0.0 07/14/2012 1109   BASOSABS 0.0 05/03/2012 1608    CMP     Component Value Date/Time   NA 137 07/14/2012 1109   NA 133* 05/03/2012 1608   K 4.8 07/14/2012 1109   CL 98 07/14/2012 1109   CO2 24 07/14/2012 1109   GLUCOSE 293* 07/14/2012 1109   GLUCOSE 307*  05/03/2012 1608   BUN 11 07/14/2012 1109   BUN 10 05/03/2012 1608   CREATININE 0.59 07/14/2012 1109   CALCIUM 9.6 07/14/2012 1109   PROT 7.6 07/14/2012 1109   PROT 7.2 05/03/2012 1608   ALBUMIN 2.5* 05/03/2012 1608   AST 33 07/14/2012 1109   ALT 31 07/14/2012 1109   ALKPHOS 181* 07/14/2012 1109   BILITOT 0.5 07/14/2012 1109   GFRNONAA 113 07/14/2012 1109   GFRAA 130 07/14/2012 1109    Assessment/plan  1. DM type 2 goal A1C below 7.5 Will recheck a1c. With elevated cbg mainly in the am, will increase her lantus to 15 u daily and change novolog to 5 u premeal for cbg > 200 only. Check lipid panel prior to next visit. Monitor cbg. Negative for microalbumin in urine. Normal foot exam - metFORMIN (GLUCOPHAGE) 500 MG tablet; Take 1 tablet (500 mg total) by mouth 2 (two) times daily with a meal.  Dispense: 60 tablet; Refill: 3 - insulin glargine (LANTUS) 100 UNIT/ML injection; Inject 0.15 mLs (15 Units total) into the skin at bedtime.  Dispense: 10 mL; Refill: 3 - insulin aspart (NOVOLOG) 100 UNIT/ML injection; Inject 5 Units into the skin 3 (three) times daily with meals. Only if your blood sugar is > 200  Dispense: 1 vial; Refill: 3 - Hemoglobin A1c - CMP - Lipid Panel; Future  2. Depression Will start her on zoloft, warning signs with suicidal ideation explained. phq 9 20 today. Reassess in 6 weeks - sertraline (ZOLOFT) 50 MG tablet; Take 1 tablet (50 mg total) by mouth daily.  Dispense: 30 tablet; Refill: 3  3. Transaminitis No symptoms currently. Monitor lft - CMP  4. Morbid obesity Encouraged exercise and to lose weight. Dietary modifications encouraged

## 2013-01-27 LAB — COMPREHENSIVE METABOLIC PANEL
ALT: 21 IU/L (ref 0–32)
AST: 16 IU/L (ref 0–40)
Albumin/Globulin Ratio: 1 — ABNORMAL LOW (ref 1.1–2.5)
BUN: 12 mg/dL (ref 6–24)
CO2: 25 mmol/L (ref 18–29)
Calcium: 9.5 mg/dL (ref 8.7–10.2)
Chloride: 96 mmol/L — ABNORMAL LOW (ref 97–108)
GFR calc Af Amer: 130 mL/min/{1.73_m2} (ref >59)
Glucose: 298 mg/dL — ABNORMAL HIGH (ref 65–99)
Potassium: 4.3 mmol/L (ref 3.5–5.2)
Sodium: 137 mmol/L (ref 134–144)
Total Bilirubin: 0.4 mg/dL (ref 0.0–1.2)
Total Protein: 8 g/dL (ref 6.0–8.5)

## 2013-01-27 LAB — HEMOGLOBIN A1C
Est. average glucose Bld gHb Est-mCnc: 235 mg/dL
Hgb A1c MFr Bld: 9.8 % — ABNORMAL HIGH (ref 4.8–5.6)

## 2013-02-05 ENCOUNTER — Encounter: Payer: Self-pay | Admitting: *Deleted

## 2013-02-21 ENCOUNTER — Encounter (HOSPITAL_COMMUNITY): Payer: Self-pay | Admitting: Emergency Medicine

## 2013-02-21 ENCOUNTER — Emergency Department (HOSPITAL_COMMUNITY)
Admission: EM | Admit: 2013-02-21 | Discharge: 2013-02-21 | Disposition: A | Discharge status: Home / Self Care | Payer: BC Managed Care – PPO | Attending: Emergency Medicine | Admitting: Emergency Medicine

## 2013-02-21 DIAGNOSIS — F3289 Other specified depressive episodes: Secondary | ICD-10-CM | POA: Insufficient documentation

## 2013-02-21 DIAGNOSIS — Y9389 Activity, other specified: Secondary | ICD-10-CM | POA: Insufficient documentation

## 2013-02-21 DIAGNOSIS — Z79899 Other long term (current) drug therapy: Secondary | ICD-10-CM | POA: Insufficient documentation

## 2013-02-21 DIAGNOSIS — F329 Major depressive disorder, single episode, unspecified: Secondary | ICD-10-CM | POA: Insufficient documentation

## 2013-02-21 DIAGNOSIS — E119 Type 2 diabetes mellitus without complications: Secondary | ICD-10-CM | POA: Insufficient documentation

## 2013-02-21 DIAGNOSIS — S39012A Strain of muscle, fascia and tendon of lower back, initial encounter: Secondary | ICD-10-CM

## 2013-02-21 DIAGNOSIS — X503XXA Overexertion from repetitive movements, initial encounter: Secondary | ICD-10-CM | POA: Insufficient documentation

## 2013-02-21 DIAGNOSIS — X500XXA Overexertion from strenuous movement or load, initial encounter: Secondary | ICD-10-CM | POA: Insufficient documentation

## 2013-02-21 DIAGNOSIS — Y9289 Other specified places as the place of occurrence of the external cause: Secondary | ICD-10-CM | POA: Insufficient documentation

## 2013-02-21 DIAGNOSIS — Z794 Long term (current) use of insulin: Secondary | ICD-10-CM | POA: Insufficient documentation

## 2013-02-21 DIAGNOSIS — S335XXA Sprain of ligaments of lumbar spine, initial encounter: Secondary | ICD-10-CM | POA: Insufficient documentation

## 2013-02-21 MED ORDER — METHOCARBAMOL 500 MG PO TABS: 500.0000 mg | ORAL_TABLET | Freq: Two times a day (BID) | ORAL | Status: DC

## 2013-02-21 MED ORDER — NAPROXEN 500 MG PO TABS: 500.0000 mg | ORAL_TABLET | Freq: Two times a day (BID) | ORAL | Status: DC

## 2013-02-21 NOTE — ED Notes (Signed)
Pt reports developing mid-back pain, that is focused on the left side. Pt reports that this started suddenly on Friday. Pt reports their is no pain at rest, however movement makes the pain return. Pt is A/O x4, in NAD, and vital signs are WDL.

## 2013-02-21 NOTE — ED Provider Notes (Signed)
Medical screening examination/treatment/procedure(s) were performed by non-physician practitioner and as supervising physician I was immediately available for consultation/collaboration.    Nelia Shiobert L Bayley Hurn, MD 02/21/13 [phone]

## 2013-02-21 NOTE — Discharge Instructions (Signed)

## 2013-02-21 NOTE — ED Provider Notes (Signed)
CSN: [id number]     Arrival date & time 02/21/13  1450 History  This chart was scribed for non-physician practitioner Fayrene Helper, PA-C working with Nelia Shi, MD by Leone Payor, ED Scribe. This patient was seen in room WTR8/WTR8 and the patient's care was started at 1450.    Chief Complaint  Patient presents with  . Back Pain    The history is provided by the patient. No language interpreter was used.    HPI Comments: [name] is a 43 y.o. female who presents to the Emergency Department complaining of intermittent left para-lumbar region that began 2-3 days ago. She states the pain is only present with certain movements. 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 She describes this pain as sharp and "knot" like. She reports applying Biofreeze without relief. She denies fever, chills, dysuria, hematuria, chest pain, SOB, rash, hemoptysis.   Past Medical History  Diagnosis Date  . Diabetes mellitus   . Morbid obesity   . Depression    Past Surgical History  Procedure Laterality Date  . Tubal ligation     Family History  Problem Relation Age of Onset  . Diabetes Mother   . Lymphoma Father   . Lymphoma Brother    History  Substance Use Topics  . Smoking status: Never Smoker   . Smokeless tobacco: Never Used  . Alcohol Use: No   OB History   Grav Para Term Preterm Abortions TAB SAB Ect Mult Living                 Review of Systems  Constitutional: Negative for fever and chills.  Respiratory: Negative for shortness of breath.   Cardiovascular: Negative for chest pain.  Genitourinary: Negative for dysuria, hematuria and flank pain.  Musculoskeletal: Positive for back pain. Negative for gait problem.  Skin: Negative for rash.    Allergies  Codeine  Home Medications   Current Outpatient Rx  Name  Route  Sig  Dispense  Refill  . insulin aspart (NOVOLOG) 100 UNIT/ML injection   Subcutaneous   Inject 5 Units into  the skin 3 (three) times daily with meals. Only if your blood sugar is > 200   1 vial   3   . insulin glargine (LANTUS) 100 UNIT/ML injection   Subcutaneous   Inject 0.15 mLs (15 Units total) into the skin at bedtime.   10 mL   3   . metFORMIN (GLUCOPHAGE) 500 MG tablet   Oral   Take 1 tablet (500 mg total) by mouth 2 (two) times daily with a meal.   60 tablet   3   . Multiple Vitamin (MULTIVITAMIN WITH MINERALS) TABS   Oral   Take 1 tablet by mouth daily.         . sertraline (ZOLOFT) 50 MG tablet   Oral   Take 1 tablet (50 mg total) by mouth daily.   30 tablet   3    BP 135/78  Pulse 65  Temp(Src) 98.7 F (37.1 C) (Oral)  Resp 18  SpO2 99%  LMP 01/22/2013 Physical Exam  Nursing note and vitals reviewed. Constitutional: She is oriented to person, place, and time. She appears well-developed and well-nourished.  Morbidly obese  HENT:  Head: Normocephalic and atraumatic.  Cardiovascular: Normal rate.   Pulmonary/Chest: Effort normal.  Abdominal: She exhibits no distension.  Genitourinary:  No CVA tenderness  Musculoskeletal:  Reproducible left paralumbar tenderness  to palpation and percussion.   Neurological: She is alert and oriented to person, place, and time.  Skin: Skin is warm and dry.  Psychiatric: She has a normal mood and affect.    ED Course  Procedures (including critical care time)  DIAGNOSTIC STUDIES: Oxygen Saturation is 99% on RA, normal by my interpretation.    COORDINATION OF CARE: 3:29 PM Pain is reproducible, likely muscle strain.  Doubt kidney stone, pyelonephritis, shingle rash. no red flags.  RICE therapy discussed.  Discussed treatment plan with pt at bedside and pt agreed to plan.   Labs Review Labs Reviewed - No data to display Imaging Review No results found.  EKG Interpretation   None       MDM   1. Strain of lumbar region    BP 135/78  Pulse 65  Temp(Src) 98.7 F (37.1 C) (Oral)  Resp 18  SpO2 99%  LMP  01/22/2013 I personally performed the services described in this documentation, which was scribed in my presence. The recorded information has been reviewed and is accurate.    Fayrene HelperBowie Kaylise Blakeley, PA-C 02/21/13 1539

## 2013-03-08 ENCOUNTER — Emergency Department (HOSPITAL_COMMUNITY)
Admission: EM | Admit: 2013-03-08 | Discharge: 2013-03-08 | Disposition: A | Discharge status: Home / Self Care | Payer: BC Managed Care – PPO | Attending: Emergency Medicine | Admitting: Emergency Medicine

## 2013-03-08 ENCOUNTER — Encounter (HOSPITAL_COMMUNITY): Payer: Self-pay | Admitting: Emergency Medicine

## 2013-03-08 ENCOUNTER — Emergency Department (HOSPITAL_COMMUNITY): Payer: BC Managed Care – PPO

## 2013-03-08 DIAGNOSIS — G8929 Other chronic pain: Secondary | ICD-10-CM | POA: Insufficient documentation

## 2013-03-08 DIAGNOSIS — M1710 Unilateral primary osteoarthritis, unspecified knee: Secondary | ICD-10-CM | POA: Insufficient documentation

## 2013-03-08 DIAGNOSIS — Z794 Long term (current) use of insulin: Secondary | ICD-10-CM | POA: Insufficient documentation

## 2013-03-08 DIAGNOSIS — Z79899 Other long term (current) drug therapy: Secondary | ICD-10-CM | POA: Insufficient documentation

## 2013-03-08 DIAGNOSIS — Z791 Long term (current) use of non-steroidal anti-inflammatories (NSAID): Secondary | ICD-10-CM | POA: Insufficient documentation

## 2013-03-08 DIAGNOSIS — F3289 Other specified depressive episodes: Secondary | ICD-10-CM | POA: Insufficient documentation

## 2013-03-08 DIAGNOSIS — F329 Major depressive disorder, single episode, unspecified: Secondary | ICD-10-CM | POA: Insufficient documentation

## 2013-03-08 DIAGNOSIS — IMO0002 Reserved for concepts with insufficient information to code with codable children: Secondary | ICD-10-CM | POA: Insufficient documentation

## 2013-03-08 DIAGNOSIS — E119 Type 2 diabetes mellitus without complications: Secondary | ICD-10-CM | POA: Insufficient documentation

## 2013-03-08 DIAGNOSIS — M1711 Unilateral primary osteoarthritis, right knee: Secondary | ICD-10-CM

## 2013-03-08 MED ORDER — HYDROCODONE-ACETAMINOPHEN 5-325 MG PO TABS: 1.0000 | ORAL_TABLET | ORAL | Status: DC | PRN

## 2013-03-08 NOTE — ED Notes (Signed)
C/o right knee pain x 1 week. States took a second job so is on her feet all the time. States she was once told by an orthopedist that her knees were "bone on bone".

## 2013-03-08 NOTE — ED Provider Notes (Signed)
CSN: [id number]     Arrival date & time 03/08/13  1057 History   First MD Initiated Contact with Patient 03/08/13 1118    This chart was scribed for Marlon Pel PA-C, a non-physician practitioner working with No att. providers found by Lewanda Rife, ED Scribe. This patient was seen in room TR05C/TR05C and the patient's care was started at 2:25 PM      Chief Complaint  Patient presents with  . Knee Pain   (Consider location/radiation/quality/duration/timing/severity/associated sxs/prior Treatment) The history is provided by the patient. No language interpreter was used.   HPI Comments: ROCQUEL ASKREN is a 43 y.o. female who presents to the Emergency Department with PMHx of DM and morbid obesity complaining of chronic right knee pain exacerbation onset 1 week. Describes pain as constant and worsening in severity. Reports she is on her feet all the time and she just picked up another job. Reports pain is exacerbated by flexion and weight bearing. Reports pain is mildly alleviated in full extension. Reports taking ibuprofen with no relief of symptoms. Denies associated recent trauma, numbness, and fever.  Past Medical History  Diagnosis Date  . Diabetes mellitus   . Morbid obesity   . Depression    Past Surgical History  Procedure Laterality Date  . Tubal ligation     Family History  Problem Relation Age of Onset  . Diabetes Mother   . Lymphoma Father   . Lymphoma Brother    History  Substance Use Topics  . Smoking status: Never Smoker   . Smokeless tobacco: Never Used  . Alcohol Use: No   OB History   Grav Para Term Preterm Abortions TAB SAB Ect Mult Living                 Review of Systems  Constitutional: Negative for fever.  Musculoskeletal: Positive for arthralgias.  Neurological: Negative for numbness.  Psychiatric/Behavioral: Negative for confusion.    Allergies  Codeine  Home Medications   Current Outpatient Rx  Name  Route  Sig  Dispense  Refill   . HYDROcodone-acetaminophen (NORCO/VICODIN) 5-325 MG per tablet   Oral   Take 1-2 tablets by mouth every 4 (four) hours as needed for moderate pain or severe pain.   20 tablet   0   . insulin aspart (NOVOLOG) 100 UNIT/ML injection   Subcutaneous   Inject 5 Units into the skin 3 (three) times daily with meals. Only if your blood sugar is > 200   1 vial   3   . insulin glargine (LANTUS) 100 UNIT/ML injection   Subcutaneous   Inject 20 Units into the skin at bedtime.         . metFORMIN (GLUCOPHAGE) 500 MG tablet   Oral   Take 1 tablet (500 mg total) by mouth 2 (two) times daily with a meal.   60 tablet   3   . methocarbamol (ROBAXIN) 500 MG tablet   Oral   Take 1 tablet (500 mg total) by mouth 2 (two) times daily.   20 tablet   0   . Multiple Vitamin (MULTIVITAMIN WITH MINERALS) TABS   Oral   Take 1 tablet by mouth daily.         . naproxen (NAPROSYN) 500 MG tablet   Oral   Take 1 tablet (500 mg total) by mouth 2 (two) times daily.   20 tablet   0   . sertraline (ZOLOFT) 50 MG tablet   Oral   Take  1 tablet (50 mg total) by mouth daily.   30 tablet   3    BP 122/71  Pulse 74  Temp(Src) 98 F (36.7 C) (Oral)  Ht 5' 6.5" (1.689 m)  Wt 308 lb (139.708 kg)  BMI 48.97 kg/m2  SpO2 99%  LMP 02/21/2013 Physical Exam  Nursing note and vitals reviewed. Constitutional: She is oriented to person, place, and time. She appears well-developed and well-nourished. No distress.  Pt is morbidly obese  HENT:  Head: Normocephalic and atraumatic.  Eyes: Conjunctivae and EOM are normal.  Neck: Neck supple. No tracheal deviation present.  Cardiovascular: Normal rate.   Pulmonary/Chest: Effort normal. No stridor. No respiratory distress.  Musculoskeletal: Normal range of motion. She exhibits no edema.       Right knee: She exhibits effusion (mild ). She exhibits normal range of motion. Tenderness found. Medial joint line and lateral joint line tenderness noted.  Pain with  flexion and extension of right knee. Full ROM of right knee. No rash, and no erythema.   Exam limited due to body habitus     Neurological: She is alert and oriented to person, place, and time.  Skin: Skin is warm and dry.  Psychiatric: She has a normal mood and affect. Her behavior is normal.    ED Course  Procedures COORDINATION OF CARE:  Nursing notes reviewed. Vital signs reviewed. Initial pt interview and examination performed.   11:48 AM-Discussed work up plan with pt at bedside, which includes x-ray of right knee. Pt agrees with plan.   Treatment plan initiated:Medications - No data to display   Initial diagnostic testing ordered.    2:25 PM Nursing Notes Reviewed/ Care Coordinated Applicable Imaging Reviewed incorporated into ED treatment Discussed results and treatment plan with pt. Pt demonstrates understanding and agrees with plan.  Labs Review Labs Reviewed - No data to display Imaging Review Dg Knee Complete 4 Views Right  03/08/2013   CLINICAL DATA:  Pain  EXAM: RIGHT KNEE - COMPLETE 4+ VIEW  COMPARISON:  September 12, 2006  FINDINGS: Frontal, lateral, and bilateral oblique views were obtained. There is no fracture, dislocation, or effusion. There is spurring in all compartments. There is mild narrowing laterally. No erosive change.  IMPRESSION: Osteoarthritic change.  No fracture or effusion.   Electronically Signed   By: Bretta BangWilliam  Woodruff M.D.   On: 03/08/2013 11:44    EKG Interpretation   None       MDM   1. 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 Short course of Vicodin Rx.  43 y.o.Braylie C Flanary's evaluation in the Emergency Department is complete. It has been determined that no acute conditions requiring further emergency intervention are present at this time. The patient/guardian have been advised of the diagnosis and plan. We have discussed signs and symptoms that warrant return to the  ED, such as changes or worsening in symptoms.  Vital signs are stable at discharge. Filed Vitals:   03/08/13 1117  BP: 122/71  Pulse: 74  Temp: 98 F (36.7 C)    Patient/guardian has voiced understanding and agreed to follow-up with the PCP or specialist.  I personally performed the services described in this documentation, which was scribed in my presence. The recorded information has been reviewed and is accurate.    Dorthula Matasiffany G Curvin Hunger, PA-C 03/08/13 1426

## 2013-03-08 NOTE — ED Provider Notes (Signed)
Medical screening examination/treatment/procedure(s) were performed by non-physician practitioner and as supervising physician I was immediately available for consultation/collaboration.     Deedra Pro, MD 03/08/13 1533 

## 2013-03-08 NOTE — Discharge Instructions (Signed)
Wear and Tear Disorders of the Knee (Arthritis, Osteoarthritis) °Everyone will experience wear and tear injuries (arthritis, osteoarthritis) of the knee. These are the changes we all get as we age. They come from the joint stress of daily living. The amount of cartilage damage in your knee and your symptoms determine if you need surgery. Mild problems require approximately two months recovery time. More severe problems take several months to recover. With mild problems, your surgeon may find worn and rough cartilage surfaces. With severe changes, your surgeon may find cartilage that has completely worn away and exposed the bone. Loose bodies of bone and cartilage, bone spurs (excess bone growth), and injuries to the menisci (cushions between the large bones of your leg) are also common. All of these problems can cause pain. °For a mild wear and tear problem, rough cartilage may simply need to be shaved and smoothed. For more severe problems with areas of exposed bone, your surgeon may use an instrument for roughing up the bone surfaces to stimulate new cartilage growth. Loose bodies are usually removed. Torn menisci may be trimmed or repaired. °ABOUT THE ARTHROSCOPIC PROCEDURE °Arthroscopy is a surgical technique. It allows your orthopedic surgeon to diagnose and treat your knee injury with accuracy. The surgeon looks into your knee through a small scope. The scope is like a small (pencil-sized) telescope. Arthroscopy is less invasive than open knee surgery. You can expect a more rapid recovery. After the procedure, you will be moved to a recovery area until most of the effects of the medication have worn off. Your caregiver will discuss the test results with you. °RECOVERY °The severity of the arthritis and the type of procedure performed will determine recovery time. Other important factors include age, physical condition, medical conditions, and the type of rehabilitation program. Strengthening your muscles after  arthroscopy helps guarantee a better recovery. Follow your caregiver's instructions. Use crutches, rest, elevate, ice, and do knee exercises as instructed. Your caregivers will help you and instruct you with exercises and other physical therapy required to regain your mobility, muscle strength, and functioning following surgery. Only take over-the-counter or prescription medicines for pain, discomfort, or fever as directed by your caregiver.  °SEEK MEDICAL CARE IF:  °· There is increased bleeding (more than a small spot) from the wound. °· You notice redness, swelling, or increasing pain in the wound. °· Pus is coming from wound. °· You develop an unexplained oral temperature above 102° F (38.9° C) , or as your caregiver suggests. °· You notice a foul smell coming from the wound or dressing. °· You have severe pain with motion of the knee. °SEEK IMMEDIATE MEDICAL CARE IF:  °· You develop a rash. °· You have difficulty breathing. °· You have any allergic problems. °MAKE SURE YOU:  °· Understand these instructions. °· Will watch your condition. °· Will get help right away if you are not doing well or get worse. °Document Released: 01/19/2000 Document Revised: 04/15/2011 Document Reviewed: 06/17/2007 °ExitCare® Patient Information ©2014 [employer]. ° °Osteoarthritis °Osteoarthritis is a disease that causes soreness and swelling (inflammation) of a joint. It occurs when the cartilage at the affected joint wears down. Cartilage acts as a cushion, covering the ends of bones where they meet to form a joint. Osteoarthritis is the most common form of arthritis. It often occurs in older people. The joints affected most often by this condition include those in the: °· Ends of the fingers. °· Thumbs. °· Neck. °· Lower back. °· Knees. °·   Hips. °CAUSES  °Over time, the cartilage that covers the ends of bones begins to wear away. This causes bone to rub on bone, producing pain and stiffness in the affected joints.  °RISK  FACTORS °Certain factors can increase your chances of having osteoarthritis, including: °· Older age. °· Excessive body weight. °· Overuse of joints. °SIGNS AND SYMPTOMS  °· Pain, swelling, and stiffness in the joint. °· Over time, the joint may lose its normal shape. °· Small deposits of bone (osteophytes) may grow on the edges of the joint. °· Bits of bone or cartilage can break off and float inside the joint space. This may cause more pain and damage. °DIAGNOSIS  °Your health care provider will do a physical exam and ask about your symptoms. Various tests may be ordered, such as: °· X-rays of the affected joint. °· An MRI scan. °· Blood tests to rule out other types of arthritis. °· Joint fluid tests. This involves using a needle to draw fluid from the joint and examining the fluid under a microscope. °TREATMENT  °Goals of treatment are to control pain and improve joint function. Treatment plans may include: °· A prescribed exercise program that allows for rest and joint relief. °· A weight control plan. °· Pain relief techniques, such as: °· Properly applied heat and cold. °· Electric pulses delivered to nerve endings under the skin (transcutaneous electrical nerve stimulation, TENS). °· Massage. °· Certain nutritional supplements. °· Medicines to control pain, such as: °· Acetaminophen. °· Nonsteroidal anti-inflammatory drugs (NSAIDs), such as naproxen. °· Narcotic or central-acting agents, such as tramadol. °· Corticosteroids. These can be given orally or as an injection. °· Surgery to reposition the bones and relieve pain (osteotomy) or to remove loose pieces of bone and cartilage. Joint replacement may be needed in advanced states of osteoarthritis. °HOME CARE INSTRUCTIONS  °· Only take over-the-counter or prescription medicines as directed by your health care provider. Take all medicines exactly as instructed. °· Maintain a healthy weight. Follow your health care provider's instructions for weight control.  This may include dietary instructions. °· Exercise as directed. Your health care provider can recommend specific types of exercise. These may include: °· Strengthening exercises These are done to strengthen the muscles that support joints affected by arthritis. They can be performed with weights or with exercise bands to add resistance. °· Aerobic activities These are exercises, such as brisk walking or low-impact aerobics, that get your heart pumping. °· Range-of-motion activities These keep your joints limber. °· Balance and agility exercises These help you maintain daily living skills. °· Rest your affected joints as directed by your health care provider. °· Follow up with your health care provider as directed. °SEEK MEDICAL CARE IF:  °· Your skin turns red. °· You develop a rash in addition to your joint pain. °· You have worsening joint pain. °SEEK IMMEDIATE MEDICAL CARE IF: °· You have a significant loss of weight or appetite. °· You have a fever along with joint or muscle aches. °· You have night sweats. °FOR MORE INFORMATION  °National Institute of Arthritis and Musculoskeletal and Skin Diseases: www.niams.nih.gov °National Institute on Aging: www.nia.nih.gov °American College of Rheumatology: www.rheumatology.org °Document Released: 01/21/2005 Document Revised: 11/11/2012 Document Reviewed: 09/28/2012 °[employer]® Patient Information ©2014 [employer]. ° °

## 2013-03-09 ENCOUNTER — Encounter: Payer: Self-pay | Admitting: Internal Medicine

## 2013-03-09 ENCOUNTER — Ambulatory Visit (INDEPENDENT_AMBULATORY_CARE_PROVIDER_SITE_OTHER): Payer: BC Managed Care – PPO | Admitting: Internal Medicine

## 2013-03-09 VITALS — BP 122/78 | HR 80 | Temp 97.9°F | Wt 321.4 lb

## 2013-03-09 DIAGNOSIS — M1990 Unspecified osteoarthritis, unspecified site: Secondary | ICD-10-CM

## 2013-03-09 DIAGNOSIS — F32A Depression, unspecified: Secondary | ICD-10-CM

## 2013-03-09 DIAGNOSIS — IMO0001 Reserved for inherently not codable concepts without codable children: Secondary | ICD-10-CM

## 2013-03-09 DIAGNOSIS — F329 Major depressive disorder, single episode, unspecified: Secondary | ICD-10-CM

## 2013-03-09 DIAGNOSIS — F3289 Other specified depressive episodes: Secondary | ICD-10-CM

## 2013-03-09 DIAGNOSIS — E1165 Type 2 diabetes mellitus with hyperglycemia: Secondary | ICD-10-CM

## 2013-03-09 MED ORDER — SERTRALINE HCL 50 MG PO TABS: 50.0000 mg | ORAL_TABLET | Freq: Every day | ORAL | Status: DC

## 2013-03-09 MED ORDER — TRAMADOL HCL 50 MG PO TABS: 50.0000 mg | ORAL_TABLET | Freq: Three times a day (TID) | ORAL | Status: DC | PRN

## 2013-03-09 MED ORDER — SERTRALINE HCL 25 MG PO TABS: 25.0000 mg | ORAL_TABLET | Freq: Every day | ORAL | Status: DC

## 2013-03-09 NOTE — Progress Notes (Signed)
Patient ID: [name], female   DOB: 11-13-1970, 43 y.o.   MRN: [id number]    Chief Complaint  Patient presents with  . Medical Managment of Chronic Issues    6 week f/u depression   Allergies  Allergen Reactions  . Codeine Shortness Of Breath   HPI 43 y/o female pt is here for follow up. She complaints of b/l knee pain. Does not want to take the vicodin she has. Mentions tramadol has helped her in past. Has orthopedic follow up cbg 120-200. No hypoglycemia Walking for exercise Eating more vegetables in her meals Mood has been better  Review of Systems  Constitutional: Negative for fever, chills, malaise/fatigue and diaphoresis. has lost 5 lbs since last visit. HENT: Negative for congestion, hearing loss and sore throat.   Eyes: Negative for blurred vision, double vision and discharge.  Respiratory: Negative for cough, sputum production, shortness of breath and wheezing.   Cardiovascular: Negative for chest pain, palpitations, orthopnea and leg swelling.  Gastrointestinal: Negative for heartburn, nausea, vomiting, abdominal pain, diarrhea and constipation.  Genitourinary: Negative for dysuria, urgency, frequency and flank pain.  Musculoskeletal: Negative for back pain,falls Skin: Negative for itching and rash.  Neurological: Negative for dizziness, tingling, focal weakness and headaches.  Psychiatric/Behavioral: Negative for  memory loss. Has occassional anxiety.  no insomnia    Past Medical History  Diagnosis Date  . Diabetes mellitus   . Morbid obesity   . Depression    Current Outpatient Prescriptions on File Prior to Visit  Medication Sig Dispense Refill  . insulin aspart (NOVOLOG) 100 UNIT/ML injection Inject 5 Units into the skin 3 (three) times daily with meals. Only if your blood sugar is > 200  1 vial  3  . insulin glargine (LANTUS) 100 UNIT/ML injection Inject 20 Units into the skin at bedtime.      . metFORMIN (GLUCOPHAGE) 500 MG tablet Take 1 tablet (500  mg total) by mouth 2 (two) times daily with a meal.  60 tablet  3  . Multiple Vitamin (MULTIVITAMIN WITH MINERALS) TABS Take 1 tablet by mouth daily.      . [DISCONTINUED] fluticasone (FLONASE) 50 MCG/ACT nasal spray Place 2 sprays into the nose daily.  16 g  0   No current facility-administered medications on file prior to visit.   Past Surgical History  Procedure Laterality Date  . Tubal ligation      Physical exam BP 122/78  Pulse 80  Temp(Src) 97.9 F (36.6 C) (Oral)  Wt 321 lb 6.4 oz (145.786 kg)  SpO2 98%  LMP 02/21/2013  General- elderly female in no acute distress, obese Head- atraumatic, normocephalic Eyes- PERRLA, EOMI, no pallor, no icterus, no discharge Neck- no lymphadenopathy, no thyromegaly, no jugular vein distension, no carotid bruit Cardiovascular- normal s1,s2, no murmurs/ rubs/ gallops Respiratory- bilateral clear to auscultation, no wheeze, no rhonchi, no crackles Abdomen- bowel sounds present, soft, non tender Musculoskeletal- able to move all 4 extremities, crepitus noted in her knee region Neurological- no focal deficit, normal reflexes, normal muscle strength, normal sensation to fine touch and vibration Psychiatry- alert and oriented to person, place and time, tearful    Lab Results  Component Value Date   HGBA1C 9.8* 01/26/2013   CMP     Component Value Date/Time   NA 137 01/26/2013 1415   NA 133* 05/03/2012 1608   K 4.3 01/26/2013 1415   CL 96* 01/26/2013 1415   CO2 25 01/26/2013 1415   GLUCOSE 298* 01/26/2013 1415  GLUCOSE 307* 05/03/2012 1608   BUN 12 01/26/2013 1415   BUN 10 05/03/2012 1608   CREATININE 0.58 01/26/2013 1415   CALCIUM 9.5 01/26/2013 1415   PROT 8.0 01/26/2013 1415   PROT 7.2 05/03/2012 1608   ALBUMIN 2.5* 05/03/2012 1608   AST 16 01/26/2013 1415   ALT 21 01/26/2013 1415   ALKPHOS 149* 01/26/2013 1415   BILITOT 0.4 01/26/2013 1415   GFRNONAA 112 01/26/2013 1415   GFRAA 130 01/26/2013 1415     Assessment/plan  Depression Mood has improved but not to where she wants. Will increase her zoloft to 75 mg daily.   DM type 2 goal A1C below 7.5 a1c 9.8 improved from before. Continue lantus 20 u daily and novolog 5 u premeal for cbg > 200 only. Continue metformin 500 mg bid. Monitor cbg. Check lipid panel prior to next visit. Negative for microalbumin in urine. Normal foot exam. Recheck a1c in April with flp  Osteoarthritis Does not want vicodin prescribed by ER. Will provide tramadol 50 mg 1 tab every 8 hours as needed for pain. Has follow up with orthopedic for possible total knee arthroplasty

## 2013-03-19 ENCOUNTER — Ambulatory Visit (INDEPENDENT_AMBULATORY_CARE_PROVIDER_SITE_OTHER): Payer: BC Managed Care – PPO | Admitting: Physician Assistant

## 2013-03-19 ENCOUNTER — Emergency Department (HOSPITAL_COMMUNITY)
Admission: EM | Admit: 2013-03-19 | Discharge: 2013-03-19 | Disposition: A | Discharge status: Home / Self Care | Payer: BC Managed Care – PPO | Source: Home / Self Care | Attending: Family Medicine | Admitting: Family Medicine

## 2013-03-19 ENCOUNTER — Encounter (HOSPITAL_COMMUNITY): Payer: Self-pay | Admitting: Emergency Medicine

## 2013-03-19 VITALS — BP 122/80 | HR 72 | Temp 98.7°F | Resp 18 | Ht 66.5 in | Wt 325.8 lb

## 2013-03-19 DIAGNOSIS — H109 Unspecified conjunctivitis: Secondary | ICD-10-CM

## 2013-03-19 DIAGNOSIS — H1089 Other conjunctivitis: Secondary | ICD-10-CM

## 2013-03-19 MED ORDER — TOBRAMYCIN 0.3 % OP SOLN: 1.0000 [drp] | Freq: Four times a day (QID) | OPHTHALMIC | Status: DC

## 2013-03-19 MED ORDER — CIPROFLOXACIN HCL 0.3 % OP SOLN: 1.0000 [drp] | OPHTHALMIC | Status: DC

## 2013-03-19 NOTE — Patient Instructions (Signed)
Bacterial Conjunctivitis  Bacterial conjunctivitis, commonly called pink eye, is an inflammation of the clear membrane that covers the white part of the eye (conjunctiva). The inflammation can also happen on the underside of the eyelids. The blood vessels in the conjunctiva become inflamed causing the eye to become red or pink. Bacterial conjunctivitis may spread easily from one eye to another and from person to person (contagious).   CAUSES   Bacterial conjunctivitis is caused by bacteria. The bacteria may come from your own skin, your upper respiratory tract, or from someone else with bacterial conjunctivitis.  SYMPTOMS   The normally white color of the eye or the underside of the eyelid is usually pink or red. The pink eye is usually associated with irritation, tearing, and some sensitivity to light. Bacterial conjunctivitis is often associated with a thick, yellowish discharge from the eye. The discharge may turn into a crust on the eyelids overnight, which causes your eyelids to stick together. If a discharge is present, there may also be some blurred vision in the affected eye.  DIAGNOSIS   Bacterial conjunctivitis is diagnosed by your caregiver through an eye exam and the symptoms that you report. Your caregiver looks for changes in the surface tissues of your eyes, which may point to the specific type of conjunctivitis. A sample of any discharge may be collected on a cotton-tip swab if you have a severe case of conjunctivitis, if your cornea is affected, or if you keep getting repeat infections that do not respond to treatment. The sample will be sent to a lab to see if the inflammation is caused by a bacterial infection and to see if the infection will respond to antibiotic medicines.  TREATMENT   · Bacterial conjunctivitis is treated with antibiotics. Antibiotic eyedrops are most often used. However, antibiotic ointments are also available. Antibiotics pills are sometimes used. Artificial tears or eye  washes may ease discomfort.  HOME CARE INSTRUCTIONS   · To ease discomfort, apply a cool, clean wash cloth to your eye for 10 20 minutes, 3 4 times a day.  · Gently wipe away any drainage from your eye with a warm, wet washcloth or a cotton ball.  · Wash your hands often with soap and water. Use paper towels to dry your hands.  · Do not share towels or wash cloths. This may spread the infection.  · Change or wash your pillow case every day.  · You should not use eye makeup until the infection is gone.  · Do not operate machinery or drive if your vision is blurred.  · Stop using contacts lenses. Ask your caregiver how to sterilize or replace your contacts before using them again. This depends on the type of contact lenses that you use.  · When applying medicine to the infected eye, do not touch the edge of your eyelid with the eyedrop bottle or ointment tube.  SEEK IMMEDIATE MEDICAL CARE IF:   · Your infection has not improved within 3 days after beginning treatment.  · You had yellow discharge from your eye and it returns.  · You have increased eye pain.  · Your eye redness is spreading.  · Your vision becomes blurred.  · You have a fever or persistent symptoms for more than 2 3 days.  · You have a fever and your symptoms suddenly get worse.  · You have facial pain, redness, or swelling.  MAKE SURE YOU:   · Understand these instructions.  · Will watch your   condition.  · Will get help right away if you are not doing well or get worse.  Document Released: 01/21/2005 Document Revised: 10/16/2011 Document Reviewed: 06/24/2011  [employer]® Patient Information ©2014 [employer].

## 2013-03-19 NOTE — Progress Notes (Signed)
   Subjective:    Patient ID: [name], female    DOB: 24-Jul-1970, 43 y.o.   MRN: [id number]  HPI 43 year old female presents for evaluation of bilateral eye redness, drainage, pruritis, and irritation.  States symptoms started this morning and have progressively worsened through the day.  Has had copious amounts of purulent drainage and swelling of her eyelids.  When she awoke this a.m her eyes were crusted shut. Has slight blurry vision secondary to swelling and drainage but no other vision changes. Denies headache, nasal congestion, otalgia, dizziness, PND, nausea, vomiting, or cough.  She did go to the ER this morning but states she was not seen and did not get a prescription because she had to leave.  She came here instead of going back to the ER.   Patient is otherwise doing well with no other concerns today.     Review of Systems  Constitutional: Negative for fever and chills.  HENT: Negative for congestion, postnasal drip, sinus pressure and sore throat.   Eyes: Positive for discharge, redness, itching and visual disturbance (slightly blurry). Negative for photophobia and pain.  Respiratory: Negative for cough.   Gastrointestinal: Negative for nausea and vomiting.  Neurological: Negative for dizziness and headaches.       Objective:   Physical Exam  Constitutional: She is oriented to person, place, and time. She appears well-developed and well-nourished.  HENT:  Head: Normocephalic and atraumatic.  Right Ear: External ear normal.  Left Ear: External ear normal.  Mouth/Throat: Oropharynx is clear and moist.  Eyes: EOM are normal. Pupils are equal, round, and reactive to light. Right eye exhibits discharge. Left eye exhibits discharge. Right conjunctiva is not injected. Left conjunctiva is injected.  Bilateral eyelid swelling. Spontaneous purulent drainage L>R  Neck: Normal range of motion.  Cardiovascular: Normal rate.   Pulmonary/Chest: Effort normal.  Neurological:  She is alert and oriented to person, place, and time.  Psychiatric: She has a normal mood and affect. Her behavior is normal. Judgment and thought content normal.          Assessment & Plan:  Bacterial conjunctivitis - Plan: ciprofloxacin (CILOXAN) 0.3 % ophthalmic solution  Will treat with Ciloxan as directed. Recheck in 48 hours if not significantly improved Ok to return to work on 2/16 as long as drainage has resolved. Need to be rechecked if still draining then.  RTC precautions discussed.

## 2013-03-19 NOTE — ED Notes (Signed)
Pt  Has  Bilateral  Swelling       Both  Eyes     With  Redness  And  Drainage  From the  Left  Eye              She  denys  Any  Injury  She  denys any known  FB           SHE  REPORTS  THE  SYMPTOMS  SINCE  YESTREDAY

## 2013-03-19 NOTE — ED Provider Notes (Signed)
CSN: [id number]     Arrival date & time 03/19/13  0935 History   First MD Initiated Contact with Patient 03/19/13 [phone]     Chief Complaint  Patient presents with  . Eye Problem     (Consider location/radiation/quality/duration/timing/severity/associated sxs/prior Treatment) Patient is a 43 y.o. female presenting with eye problem. The history is provided by the patient.  Eye Problem Location:  L eye Quality:  Burning Severity:  Mild Onset quality:  Sudden Duration:  2 days Progression:  Worsening Chronicity:  New Relieved by:  None tried Worsened by:  Nothing tried Ineffective treatments:  None tried Associated symptoms: crusting, discharge, redness and tearing   Associated symptoms: no blurred vision, no decreased vision, no double vision, no facial rash and no photophobia   Risk factors: no recent URI     Past Medical History  Diagnosis Date  . Diabetes mellitus   . Morbid obesity   . Depression    Past Surgical History  Procedure Laterality Date  . Tubal ligation     Family History  Problem Relation Age of Onset  . Diabetes Mother   . Lymphoma Father   . Lymphoma Brother    History  Substance Use Topics  . Smoking status: Never Smoker   . Smokeless tobacco: Never Used  . Alcohol Use: No   OB History   Grav Para Term Preterm Abortions TAB SAB Ect Mult Living                 Review of Systems  Constitutional: Negative for fever.  HENT: Negative.   Eyes: Positive for discharge and redness. Negative for blurred vision, double vision, photophobia, pain and visual disturbance.      Allergies  Codeine  Home Medications   Current Outpatient Rx  Name  Route  Sig  Dispense  Refill  . insulin aspart (NOVOLOG) 100 UNIT/ML injection   Subcutaneous   Inject 5 Units into the skin 3 (three) times daily with meals. Only if your blood sugar is > 200   1 vial   3   . insulin glargine (LANTUS) 100 UNIT/ML injection   Subcutaneous   Inject 20 Units into the  skin at bedtime.         . metFORMIN (GLUCOPHAGE) 500 MG tablet   Oral   Take 1 tablet (500 mg total) by mouth 2 (two) times daily with a meal.   60 tablet   3   . Multiple Vitamin (MULTIVITAMIN WITH MINERALS) TABS   Oral   Take 1 tablet by mouth daily.         . sertraline (ZOLOFT) 25 MG tablet   Oral   Take 1 tablet (25 mg total) by mouth daily. Take with 50 mg tablet for a total of 75 mg daily   30 tablet   3   . sertraline (ZOLOFT) 50 MG tablet   Oral   Take 1 tablet (50 mg total) by mouth daily.   30 tablet   3   . tobramycin (TOBREX) 0.3 % ophthalmic solution   Left Eye   Place 1 drop into the left eye every 6 (six) hours. After warm soak to eye   5 mL   0   . traMADol (ULTRAM) 50 MG tablet   Oral   Take 1 tablet (50 mg total) by mouth every 8 (eight) hours as needed.   30 tablet   0    BP 129/83  Pulse 72  Temp(Src) 97.5 F (36.4  C) (Oral)  Resp 18  SpO2 98%  LMP 02/21/2013 Physical Exam  Nursing note and vitals reviewed. Constitutional: She appears well-developed and well-nourished.  HENT:  Right Ear: External ear normal.  Left Ear: External ear normal.  Mouth/Throat: Oropharynx is clear and moist.  Eyes: EOM are normal. Pupils are equal, round, and reactive to light. Left eye exhibits no discharge and no exudate. No foreign body present in the left eye. Left conjunctiva is injected. Left conjunctiva has no hemorrhage.    ED Course  Procedures (including critical care time) Labs Review Labs Reviewed - No data to display Imaging Review No results found.    MDM   Final diagnoses:  Conjunctivitis of left eye        Linna HoffJames D Kindl, MD 03/19/13 1004

## 2013-05-05 ENCOUNTER — Encounter: Payer: Self-pay | Admitting: Internal Medicine

## 2013-05-05 ENCOUNTER — Ambulatory Visit (INDEPENDENT_AMBULATORY_CARE_PROVIDER_SITE_OTHER): Payer: BC Managed Care – PPO | Admitting: Internal Medicine

## 2013-05-05 VITALS — BP 130/76 | HR 72 | Temp 98.0°F | Wt 336.0 lb

## 2013-05-05 DIAGNOSIS — F32A Depression, unspecified: Secondary | ICD-10-CM

## 2013-05-05 DIAGNOSIS — M1990 Unspecified osteoarthritis, unspecified site: Secondary | ICD-10-CM

## 2013-05-05 DIAGNOSIS — IMO0002 Reserved for concepts with insufficient information to code with codable children: Secondary | ICD-10-CM

## 2013-05-05 DIAGNOSIS — E1165 Type 2 diabetes mellitus with hyperglycemia: Secondary | ICD-10-CM

## 2013-05-05 DIAGNOSIS — IMO0001 Reserved for inherently not codable concepts without codable children: Secondary | ICD-10-CM

## 2013-05-05 DIAGNOSIS — F329 Major depressive disorder, single episode, unspecified: Secondary | ICD-10-CM

## 2013-05-05 DIAGNOSIS — F3289 Other specified depressive episodes: Secondary | ICD-10-CM

## 2013-05-05 NOTE — Progress Notes (Signed)
Patient ID: [name], female   DOB: May 13, 1970, 43 y.o.   MRN: [id number]    Chief Complaint  Patient presents with  . Follow-up    dm, HTN, obesity   Allergies  Allergen Reactions  . Codeine Shortness Of Breath   HPI 43 y/o female patient is here for routine follow up visit. She has uncontrolled dm and HTN with obesity. She has been feeling low recently and has crying spells out of the blue for few weeks. Denies any suicidal thoughts or thoughts about hurting anyone else. Taking her zoloft but does not feel like she is getting the desired effect. She mentions being on celexa and zoloft in the past as well with trazodone cbg fasting 160-220 cbg post meal 70-320 On lantus 20 u daily and SSI novolog The cost of novolog is becoming an issue for her She has gained 10 lbs since her last visit She has not been exercising and joints are bothering her  Review of Systems   Constitutional: Negative for fever, chills, malaise/fatigue and diaphoresis. Has gained 10 lbs HENT: Negative for congestion, hearing loss and sore throat.    Eyes: Negative for blurred vision, double vision and discharge.   Respiratory: Negative for cough, sputum production, shortness of breath and wheezing.    Cardiovascular: Negative for chest pain, palpitations, orthopnea and leg swelling.   Gastrointestinal: Negative for heartburn, nausea, vomiting, abdominal pain, diarrhea and constipation.   Genitourinary: Negative for dysuria, urgency, frequency and flank pain.   Musculoskeletal: Negative for back pain,falls. Has shoulder and knee pain Skin: Negative for itching and rash.   Neurological: Negative for dizziness, tingling, focal weakness and headaches.   Psychiatric/Behavioral: Negative for  memory loss. Has occassional anxiety.  no insomnia  Past Medical History  Diagnosis Date  . Diabetes mellitus   . Morbid obesity   . Depression   . Allergy    Current Outpatient Prescriptions on File Prior to  Visit  Medication Sig Dispense Refill  . insulin aspart (NOVOLOG) 100 UNIT/ML injection Inject 5 Units into the skin 3 (three) times daily with meals. Only if your blood sugar is > 200  1 vial  3  . insulin glargine (LANTUS) 100 UNIT/ML injection Inject 20 Units into the skin at bedtime.      . metFORMIN (GLUCOPHAGE) 500 MG tablet Take 1 tablet (500 mg total) by mouth 2 (two) times daily with a meal.  60 tablet  3  . sertraline (ZOLOFT) 25 MG tablet Take 1 tablet (25 mg total) by mouth daily. Take with 50 mg tablet for a total of 75 mg daily  30 tablet  3  . sertraline (ZOLOFT) 50 MG tablet Take 1 tablet (50 mg total) by mouth daily.  30 tablet  3  . traMADol (ULTRAM) 50 MG tablet Take 1 tablet (50 mg total) by mouth every 8 (eight) hours as needed.  30 tablet  0  . Multiple Vitamin (MULTIVITAMIN WITH MINERALS) TABS Take 1 tablet by mouth daily.      . [DISCONTINUED] fluticasone (FLONASE) 50 MCG/ACT nasal spray Place 2 sprays into the nose daily.  16 g  0   No current facility-administered medications on file prior to visit.   Past Surgical History  Procedure Laterality Date  . Tubal ligation     Family History  Problem Relation Age of Onset  . Diabetes Mother   . Lymphoma Father   . Lymphoma Brother    Physical exam BP 130/76  Pulse 72  Temp(Src) 98 F (36.7 C) (Oral)  Wt 336 lb (152.409 kg)  SpO2 98%  General- adult female in no acute distress, obese Head- atraumatic, normocephalic Eyes- PERRLA, EOMI, no pallor, no icterus, no discharge Neck- no lymphadenopathy, no thyromegaly Cardiovascular- normal s1,s2, no murmurs/ rubs/ gallops Respiratory- bilateral clear to auscultation, no wheeze, no rhonchi, no crackles Abdomen- bowel sounds present, soft, non tender Musculoskeletal- able to move all 4 extremities, restricted ROM in her shoulder and knees with crepitus present, good distal pulses Neurological- no focal deficit, normal reflexes, normal muscle strength, normal sensation  to fine touch and vibration Psychiatry- alert and oriented to person, place and time, tearful  Labs- Lab Results  Component Value Date   HGBA1C 9.8* 01/26/2013   CMP     Component Value Date/Time   NA 137 01/26/2013 1415   NA 133* 05/03/2012 1608   K 4.3 01/26/2013 1415   CL 96* 01/26/2013 1415   CO2 25 01/26/2013 1415   GLUCOSE 298* 01/26/2013 1415   GLUCOSE 307* 05/03/2012 1608   BUN 12 01/26/2013 1415   BUN 10 05/03/2012 1608   CREATININE 0.58 01/26/2013 1415   CALCIUM 9.5 01/26/2013 1415   PROT 8.0 01/26/2013 1415   PROT 7.2 05/03/2012 1608   ALBUMIN 2.5* 05/03/2012 1608   AST 16 01/26/2013 1415   ALT 21 01/26/2013 1415   ALKPHOS 149* 01/26/2013 1415   BILITOT 0.4 01/26/2013 1415   GFRNONAA 112 01/26/2013 1415   GFRAA 130 01/26/2013 1415   Assessment/plan  1. DM (diabetes mellitus), type 2, uncontrolled Check a1c today. Continue lantus 20 u and SSI novolog for now. Continue metformin. After reviewing a1c, consider taking her off novolog and adding another hypoglycemic agent - CMP - Hemoglobin A1c  2. Osteoarthritis Encouraged exercise and weight loss. Continue prn tramadol  3. Depression Will stop zoloft. Will start her on sample of viibryd titration pack 10 mg daily day 1-7, 20 mg daily for day 8-14 and 40 mg daily there onwards. Reassess in 3 weeks

## 2013-05-06 LAB — COMPREHENSIVE METABOLIC PANEL
ALBUMIN: 3.7 g/dL (ref 3.5–5.5)
ALK PHOS: 200 IU/L — AB (ref 39–117)
ALT: 42 IU/L — AB (ref 0–32)
AST: 44 IU/L — ABNORMAL HIGH (ref 0–40)
Albumin/Globulin Ratio: 0.9 — ABNORMAL LOW (ref 1.1–2.5)
BILIRUBIN TOTAL: 0.4 mg/dL (ref 0.0–1.2)
BUN / CREAT RATIO: 18 (ref 9–23)
BUN: 11 mg/dL (ref 6–24)
CHLORIDE: 95 mmol/L — AB (ref 97–108)
CO2: 23 mmol/L (ref 18–29)
Calcium: 9.2 mg/dL (ref 8.7–10.2)
Creatinine, Ser: 0.6 mg/dL (ref 0.57–1.00)
GFR calc Af Amer: 128 mL/min/{1.73_m2} (ref >59)
GFR, EST NON AFRICAN AMERICAN: 111 mL/min/{1.73_m2} (ref >59)
Globulin, Total: 4 g/dL (ref 1.5–4.5)
Glucose: 98 mg/dL (ref 65–99)
POTASSIUM: 3.7 mmol/L (ref 3.5–5.2)
Sodium: 138 mmol/L (ref 134–144)
Total Protein: 7.7 g/dL (ref 6.0–8.5)

## 2013-05-06 LAB — HEMOGLOBIN A1C
Est. average glucose Bld gHb Est-mCnc: 272 mg/dL
HEMOGLOBIN A1C: 11.1 % — AB (ref 4.8–5.6)

## 2013-05-10 ENCOUNTER — Telehealth: Payer: Self-pay | Admitting: *Deleted

## 2013-05-10 DIAGNOSIS — E1159 Type 2 diabetes mellitus with other circulatory complications: Secondary | ICD-10-CM

## 2013-05-10 DIAGNOSIS — E119 Type 2 diabetes mellitus without complications: Secondary | ICD-10-CM

## 2013-05-10 DIAGNOSIS — R7989 Other specified abnormal findings of blood chemistry: Secondary | ICD-10-CM

## 2013-05-10 DIAGNOSIS — R945 Abnormal results of liver function studies: Secondary | ICD-10-CM

## 2013-05-10 MED ORDER — METFORMIN HCL 500 MG PO TABS: 1000.0000 mg | ORAL_TABLET | Freq: Two times a day (BID) | ORAL | Status: DC

## 2013-05-10 MED ORDER — INSULIN GLARGINE 100 UNIT/ML ~~LOC~~ SOLN: 25.0000 [IU] | Freq: Every day | SUBCUTANEOUS | Status: DC

## 2013-05-10 NOTE — Telephone Encounter (Signed)
Message copied by Lamont SnowballICE, SHARON L on Mon May 10, 2013  4:53 PM ------      Message from: Oneal GroutPANDEY, MAHIMA      Created: Sun May 09, 2013  3:53 PM       Yours sugar has been poorly controlled. Will need to increase your metformin to 1000 mg twice a day. Send new scripts.  Also will need to increase your lantus to 25 u daily for now. Check your blood sugar at least 3 times a day and please bring your glucometer next appointment. .your liver function test shows abnormal liver test raising concern for gall stone. Will get liver ultrasound. pls schedule one for patient. ------

## 2013-05-10 NOTE — Telephone Encounter (Signed)
Spoke with patient regarding lab results, informed her of the changes being made to her metformin and lantus . She stated that she understood her medication changes and would check her blood sugar 3 times daily, patient will bring her glucometer on next visit. Liver U/S will be set up and patient will be called when completed

## 2013-05-12 ENCOUNTER — Other Ambulatory Visit: Payer: Self-pay | Admitting: *Deleted

## 2013-05-12 DIAGNOSIS — R945 Abnormal results of liver function studies: Secondary | ICD-10-CM

## 2013-05-12 DIAGNOSIS — R7989 Other specified abnormal findings of blood chemistry: Secondary | ICD-10-CM

## 2013-05-20 ENCOUNTER — Ambulatory Visit (HOSPITAL_COMMUNITY)
Admission: RE | Admit: 2013-05-20 | Discharge: 2013-05-20 | Disposition: A | Discharge status: Home / Self Care | Payer: BC Managed Care – PPO | Source: Ambulatory Visit | Attending: Internal Medicine | Admitting: Internal Medicine

## 2013-05-20 DIAGNOSIS — R7989 Other specified abnormal findings of blood chemistry: Secondary | ICD-10-CM

## 2013-05-20 DIAGNOSIS — R945 Abnormal results of liver function studies: Secondary | ICD-10-CM

## 2013-05-20 DIAGNOSIS — K8020 Calculus of gallbladder without cholecystitis without obstruction: Secondary | ICD-10-CM | POA: Insufficient documentation

## 2013-05-24 ENCOUNTER — Telehealth: Payer: Self-pay | Admitting: *Deleted

## 2013-05-24 NOTE — Telephone Encounter (Signed)
I recommend she return to 20mg .  Maybe she will not need 40mg  of the medication.  We could give her a script for 20mg  pills 1 month supply and have her f/u with her pcp

## 2013-05-24 NOTE — Telephone Encounter (Signed)
Patient started Viibryd and did fine taking week 1 of 10mg  and week 2 of 20mg , but when she got to Week 3 of 40mg -- she took one dose of the 40mg  and felt funny and feeling like she was going to pass out. Patient stopped taking it and now wants to know what she should do? Please Advise.   [facility] [address]

## 2013-05-25 ENCOUNTER — Other Ambulatory Visit: Payer: Self-pay | Admitting: *Deleted

## 2013-05-25 MED ORDER — VILAZODONE HCL 20 MG PO TABS: ORAL_TABLET | ORAL | Status: DC

## 2013-05-25 NOTE — Telephone Encounter (Signed)
Patient Notified and faxed to pharmacy 

## 2013-06-02 ENCOUNTER — Encounter: Payer: Self-pay | Admitting: Internal Medicine

## 2013-06-02 ENCOUNTER — Ambulatory Visit (INDEPENDENT_AMBULATORY_CARE_PROVIDER_SITE_OTHER): Payer: BC Managed Care – PPO | Admitting: Internal Medicine

## 2013-06-02 VITALS — BP 122/84 | HR 72 | Temp 98.7°F | Wt 327.0 lb

## 2013-06-02 DIAGNOSIS — F32A Depression, unspecified: Secondary | ICD-10-CM

## 2013-06-02 DIAGNOSIS — E1159 Type 2 diabetes mellitus with other circulatory complications: Secondary | ICD-10-CM

## 2013-06-02 DIAGNOSIS — F329 Major depressive disorder, single episode, unspecified: Secondary | ICD-10-CM

## 2013-06-02 DIAGNOSIS — L0291 Cutaneous abscess, unspecified: Secondary | ICD-10-CM

## 2013-06-02 DIAGNOSIS — K8020 Calculus of gallbladder without cholecystitis without obstruction: Secondary | ICD-10-CM

## 2013-06-02 DIAGNOSIS — L0390 Cellulitis, unspecified: Secondary | ICD-10-CM

## 2013-06-02 DIAGNOSIS — F3289 Other specified depressive episodes: Secondary | ICD-10-CM

## 2013-06-02 MED ORDER — VORTIOXETINE HBR 10 MG PO TABS: 10.0000 mg | ORAL_TABLET | Freq: Every day | ORAL | Status: DC

## 2013-06-02 MED ORDER — SULFAMETHOXAZOLE-TMP DS 800-160 MG PO TABS: 1.0000 | ORAL_TABLET | Freq: Two times a day (BID) | ORAL | Status: DC

## 2013-06-02 MED ORDER — INSULIN GLARGINE 100 UNIT/ML ~~LOC~~ SOLN: 20.0000 [IU] | Freq: Every day | SUBCUTANEOUS | Status: DC

## 2013-06-02 NOTE — Progress Notes (Signed)
Patient ID: [name], female   DOB: August 11, 1970, 43 y.o.   MRN: [id number]    Chief Complaint  Patient presents with  . Follow-up    3 week f/u depression & discuss lab results  . other    round bump under RT breast x 1 1/2 weeks   Allergies  Allergen Reactions  . Codeine Shortness Of Breath   HPI  43 y/o female patient with history of uncontrolled dm and obesity along with depression is here for follow up. Has lost 9 lbs since last visit cbg 120-190. Few readings of 221, 203 with highest of 409 and that was on the day when she was snacking on junk food She has noticed a bump underneath her right breast a week and half back. She feels it to have increased in size, no drainage/ discharge reported and it hurts.  She stopped taking viibryd with heart racing and with deranged LFT. She has been on several antidepressant before without desired result. On review of her lab last visit, she had deranged/ elevated liver enzyme and ultrasound of abdomen showed cholelithiasis without cholecystitis. She has occassional abdominal discomfort On lantus 20 u daily though was adjusted to 25 u last time, pt continued the same dose. Taking metformin 1000 mg bid and SSI novolog  Review of Systems   Constitutional: Negative for fever, chills, malaise/fatigue and diaphoresis.  HENT: Negative for congestion, hearing loss and sore throat.    Eyes: Negative for blurred vision, double vision and discharge.   Respiratory: Negative for cough, sputum production, shortness of breath and wheezing.    Cardiovascular: Negative for chest pain, palpitations, orthopnea and leg swelling.   Gastrointestinal: Negative for heartburn, nausea, vomiting, diarrhea  Genitourinary: Negative for dysuria, urgency, frequency and flank pain.   Musculoskeletal: Negative for back pain,falls. Has shoulder and knee pain Skin: see hpi Neurological: Negative for dizziness, tingling, focal weakness and headaches.     Psychiatric/Behavioral: Negative for  memory loss. Has occassional anxiety.  no insomnia    Past Medical History  Diagnosis Date  . Diabetes mellitus   . Morbid obesity   . Depression   . Allergy    Past Surgical History  Procedure Laterality Date  . Tubal ligation     [facility]. [facility]  Physical exam BP 122/84  Pulse 72  Temp(Src) 98.7 F (37.1 C) (Oral)  Wt 327 lb (148.326 kg)  SpO2 99%  General- adult female in no acute distress, obese Head- atraumatic, normocephalic Eyes- PERRLA, EOMI, no pallor, no icterus, no discharge Chest- has a nickle size lesion underneath her right breast with purulent drainage on aplying pressure, sent for wound culture, tenderness present, no warmth noted Neck- no lymphadenopathy, no thyromegaly Cardiovascular- normal s1,s2, no murmurs/ rubs/ gallops Respiratory- bilateral clear to auscultation, no wheeze, no rhonchi, no crackles Abdomen- bowel sounds present, soft, non tender, distended Musculoskeletal- able to move all 4 extremities, restricted ROM in her shoulder and knees with crepitus present, good distal pulses Neurological- no focal deficit, normal reflexes, normal muscle strength, normal sensation to fine touch and vibration Psychiatry- alert and oriented to person, place and time, tearful   Lab Results  Component Value Date   HGBA1C 11.1* 05/05/2013   05/28/13 Ultrasound abdomen IMPRESSION: Cholelithiasis without sonographic evidence of acute cholecystitis. Mild heterogeneous echotexture of the liver, nonspecific. Differential diagnosis includes but not limited to acute hepatitis. No focal liver lesion is identified.  Assessment/plan  1. Type II or unspecified type diabetes mellitus with peripheral  circulatory disorders, uncontrolled(250.72) Improved cbg reading and has lost weight. Continue lantus 20 u with SSI novolog and metformin 1000 mg bid - insulin glargine (LANTUS) 100 UNIT/ML injection; Inject 0.2 mLs (20  Units total) into the skin daily.  Dispense: 10 mL; Refill: 5  2. Cholelithiasis With frequent abdominal discomfort, refer to surgery for possible elective cholecystectomy - Ambulatory referral to General Surgery  3. Cellulitis On the skin in chest area, f/u wound culture. Reviewed old records and had wound similar to this in 2011 which grew MRSA, reviewed sensitivity report from then and will have her on bactrim ds 1 tab bid for 10 days, reassess if no improvement  4. Morbid obesity Congratulated on weight loss and encouraged to continue with exercise and dietary restriction  5. Depression With failure to achieve desired response with viibryd, will start her on brentillex 10 mg daily- sample for 30 days provided, reassess in 4 weeks. Common side effects explained  6. V58.83 Given her being tried on several mood medication without desired effect, will send genetic teesting to help assess for efficacy of medication, the metabolic pathway for these medications to assess if there is adequate metabolization in her system.

## 2013-06-07 LAB — ANAEROBIC CULTURE

## 2013-06-11 ENCOUNTER — Other Ambulatory Visit: Payer: BC Managed Care – PPO

## 2013-06-14 ENCOUNTER — Ambulatory Visit (INDEPENDENT_AMBULATORY_CARE_PROVIDER_SITE_OTHER): Payer: BC Managed Care – PPO | Admitting: Surgery

## 2013-06-15 ENCOUNTER — Ambulatory Visit: Payer: BC Managed Care – PPO | Admitting: Internal Medicine

## 2013-06-29 ENCOUNTER — Ambulatory Visit: Payer: BC Managed Care – PPO | Admitting: Internal Medicine

## 2013-06-29 DIAGNOSIS — Z0289 Encounter for other administrative examinations: Secondary | ICD-10-CM

## 2013-07-01 ENCOUNTER — Ambulatory Visit (INDEPENDENT_AMBULATORY_CARE_PROVIDER_SITE_OTHER): Payer: BC Managed Care – PPO | Admitting: General Surgery

## 2013-07-07 ENCOUNTER — Encounter: Payer: Self-pay | Admitting: Internal Medicine

## 2013-07-07 ENCOUNTER — Ambulatory Visit (INDEPENDENT_AMBULATORY_CARE_PROVIDER_SITE_OTHER): Payer: BC Managed Care – PPO | Admitting: Internal Medicine

## 2013-07-07 VITALS — BP 118/72 | HR 75 | Temp 97.6°F | Resp 18 | Ht 66.5 in | Wt 329.4 lb

## 2013-07-07 DIAGNOSIS — F32A Depression, unspecified: Secondary | ICD-10-CM

## 2013-07-07 DIAGNOSIS — E119 Type 2 diabetes mellitus without complications: Secondary | ICD-10-CM

## 2013-07-07 DIAGNOSIS — F329 Major depressive disorder, single episode, unspecified: Secondary | ICD-10-CM

## 2013-07-07 DIAGNOSIS — K8020 Calculus of gallbladder without cholecystitis without obstruction: Secondary | ICD-10-CM | POA: Insufficient documentation

## 2013-07-07 DIAGNOSIS — F3289 Other specified depressive episodes: Secondary | ICD-10-CM

## 2013-07-07 DIAGNOSIS — J019 Acute sinusitis, unspecified: Secondary | ICD-10-CM

## 2013-07-07 MED ORDER — AZITHROMYCIN 250 MG PO TABS: ORAL_TABLET | ORAL | Status: DC

## 2013-07-07 MED ORDER — VORTIOXETINE HBR 20 MG PO TABS: 20.0000 mg | ORAL_TABLET | Freq: Every day | ORAL | Status: DC

## 2013-07-07 NOTE — Progress Notes (Signed)
Patient ID: [name], female   DOB: 03/17/1970, 43 y.o.   MRN: [id number]    Chief Complaint  Patient presents with  . Follow-up   Allergies  Allergen Reactions  . Codeine Shortness Of Breath   HPI 43 y/o female pt is here for follow up. cbg has een between 100-200 mostly. Complaint with her medication. Has appointment with surgery for her gallstone 07/15/13. Has ocassional pain in her abdomen with fatty meal and feels nauseous. Taking her mood medication and mood somewhat improved Feels stuffed in her sinuses for a week, has greensih nasal discharge and heaviness in her maxillary sinuses. Does not smoke. Has tried OTC sinus medications  ROS No fever or chills No polyuria or polydypsia No chest pain No abdominal pain at present Has cut down on fried and junk food  Past Medical History  Diagnosis Date  . Diabetes mellitus   . Morbid obesity   . Depression   . Allergy    Medication reviewed. See [facility]  Lab Results  Component Value Date   HGBA1C 11.1* 05/05/2013   CMP     Component Value Date/Time   NA 138 05/05/2013 1649   NA 133* 05/03/2012 1608   K 3.7 05/05/2013 1649   CL 95* 05/05/2013 1649   CO2 23 05/05/2013 1649   GLUCOSE 98 05/05/2013 1649   GLUCOSE 307* 05/03/2012 1608   BUN 11 05/05/2013 1649   BUN 10 05/03/2012 1608   CREATININE 0.60 05/05/2013 1649   CALCIUM 9.2 05/05/2013 1649   PROT 7.7 05/05/2013 1649   PROT 7.2 05/03/2012 1608   ALBUMIN 2.5* 05/03/2012 1608   AST 44* 05/05/2013 1649   ALT 42* 05/05/2013 1649   ALKPHOS 200* 05/05/2013 1649   BILITOT 0.4 05/05/2013 1649   GFRNONAA 111 05/05/2013 1649   GFRAA 128 05/05/2013 1649   Lipid Panel     Component Value Date/Time   TRIG 57 07/14/2012 1109   HDL 95 07/14/2012 1109   CHOLHDL 1.9 07/14/2012 1109   LDLCALC 73 07/14/2012 1109   Physical exam BP 118/72  Pulse 75  Temp(Src) 97.6 F (36.4 C) (Oral)  Resp 18  Ht 5' 6.5" (1.689 m)  Wt 329 lb 6.4 oz (149.415 kg)  BMI 52.38 kg/m2  SpO2 99%  General- adult female in  no acute distress, obese Head- atraumatic, normocephalic Eyes- PERRLA, EOMI, no pallor, no icterus, no discharge Neck- no lymphadenopathy, no thyromegaly Cardiovascular- normal s1,s2, no murmurs/ rubs/ gallops Respiratory- bilateral clear to auscultation, no wheeze, no rhonchi, no crackles Abdomen- bowel sounds present, soft, non tender Musculoskeletal- able to move all 4 extremities, restricted ROM in her shoulder and knees with crepitus present, good distal pulses Neurological- no focal deficit, normal reflexes, normal muscle strength, normal sensation to fine touch and vibration Psychiatry- alert and oriented to person, place and time  Assessment/Plan  1. DM type 2 goal A1C below 7.5 Continue metformin with lantus and novolog, check a1c today and adjust ed if needed. Normal lipid panel. Consider aspirin next visit - CMP; Future - Hemoglobin A1c; Future  2. Depression Improved some. Increase vortioxetine to 20 mg daily and reassess in 4 weeks  3. Acute sinus infection zpack for 5 days, reassess if no improvement  4. Gall stones Seeing gen surgery 07/15/13, avoid fatty food for now

## 2013-07-15 ENCOUNTER — Ambulatory Visit (INDEPENDENT_AMBULATORY_CARE_PROVIDER_SITE_OTHER): Payer: BC Managed Care – PPO | Admitting: General Surgery

## 2013-08-16 ENCOUNTER — Other Ambulatory Visit: Payer: BC Managed Care – PPO

## 2013-08-18 ENCOUNTER — Ambulatory Visit: Payer: BC Managed Care – PPO | Admitting: Internal Medicine

## 2013-08-18 DIAGNOSIS — Z0289 Encounter for other administrative examinations: Secondary | ICD-10-CM

## 2013-08-28 ENCOUNTER — Encounter (HOSPITAL_COMMUNITY): Payer: Self-pay | Admitting: Emergency Medicine

## 2013-08-28 ENCOUNTER — Emergency Department (HOSPITAL_COMMUNITY)
Admission: EM | Admit: 2013-08-28 | Discharge: 2013-08-28 | Disposition: A | Discharge status: Home / Self Care | Payer: BC Managed Care – PPO | Attending: Emergency Medicine | Admitting: Emergency Medicine

## 2013-08-28 DIAGNOSIS — Z794 Long term (current) use of insulin: Secondary | ICD-10-CM | POA: Insufficient documentation

## 2013-08-28 DIAGNOSIS — Z79899 Other long term (current) drug therapy: Secondary | ICD-10-CM | POA: Insufficient documentation

## 2013-08-28 DIAGNOSIS — J3489 Other specified disorders of nose and nasal sinuses: Secondary | ICD-10-CM | POA: Insufficient documentation

## 2013-08-28 DIAGNOSIS — R42 Dizziness and giddiness: Secondary | ICD-10-CM | POA: Insufficient documentation

## 2013-08-28 DIAGNOSIS — R110 Nausea: Secondary | ICD-10-CM | POA: Insufficient documentation

## 2013-08-28 DIAGNOSIS — Z8659 Personal history of other mental and behavioral disorders: Secondary | ICD-10-CM | POA: Insufficient documentation

## 2013-08-28 DIAGNOSIS — E119 Type 2 diabetes mellitus without complications: Secondary | ICD-10-CM | POA: Insufficient documentation

## 2013-08-28 DIAGNOSIS — R0981 Nasal congestion: Secondary | ICD-10-CM

## 2013-08-28 DIAGNOSIS — E669 Obesity, unspecified: Secondary | ICD-10-CM | POA: Insufficient documentation

## 2013-08-28 MED ORDER — FLUTICASONE PROPIONATE 50 MCG/ACT NA SUSP: 2.0000 | Freq: Every day | NASAL | Status: DC

## 2013-08-28 NOTE — ED Notes (Signed)
Pt. Stated, I've been having pressure in my face and having some dizziness since yesterday.  I had some nausea but its because of the sinus medicine I took without eating .

## 2013-08-28 NOTE — Discharge Instructions (Signed)
Please use flonase nasal spray to help you with sinus pressure/congestion. You can also use the over the counter medicines that you think helps you such as mucinex or sudafed.

## 2013-08-28 NOTE — ED Notes (Signed)
Arrived to room at 1215.

## 2013-08-28 NOTE — ED Provider Notes (Signed)
CSN: [id number]     Arrival date & time [id number]/25/15  [id number] History   First MD Initiated Contact with Patient 08/28/13 1111     Chief Complaint  Patient presents with  . Dizziness  . Facial Pain     (Consider location/radiation/quality/duration/timing/severity/associated sxs/prior Treatment) HPI  [age] female with diabetes, obesity, and depression here with sinus pressure/pain and dizziness. 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 She took mucinex and it helped relief her sinus pressure and dizziness. Yesterday it recurred while sitting down again. She was dizzy and had sinus pain and frontal headache. Took sudafed which helped with the headache but dizziness didn't resolve completely. Denies any fever/chills, rhinorrhea, cough, ear pain. Denies chest pain/SOB. She always gets sinus pressure and pain but dizziness is new. Denies any vertigo. Has some nausea but no emesis. Currently her sinus pain is on the frontal and maxillary area 5 out of 10 without radiation. Not dizzy currently.   Past Medical History  Diagnosis Date  . Diabetes mellitus   . Morbid obesity   . Depression   . Allergy    Past Surgical History  Procedure Laterality Date  . Tubal ligation     Family History  Problem Relation Age of Onset  . Diabetes Mother   . Lymphoma Father   . Lymphoma Brother    History  Substance Use Topics  . Smoking status: Never Smoker   . Smokeless tobacco: Never Used  . Alcohol Use: No   OB History   Grav Para Term Preterm Abortions TAB SAB Ect Mult Living                 Review of Systems  Constitutional: Negative for fever, chills, diaphoresis, appetite change and fatigue.  HENT: Positive for sinus pressure. Negative for congestion, dental problem, drooling, ear discharge, ear pain, facial swelling, hearing loss, mouth sores, nosebleeds, postnasal drip, rhinorrhea, sneezing, sore throat, tinnitus, trouble swallowing  and voice change.   Eyes: Negative.   Respiratory: Negative.   Cardiovascular: Negative.   Gastrointestinal: Positive for nausea. Negative for vomiting, diarrhea and blood in stool.  Endocrine: Negative.   Genitourinary: Negative.   Musculoskeletal: Negative.   Skin: Negative.   Neurological: Positive for dizziness, light-headedness and headaches. Negative for tremors, seizures, syncope, facial asymmetry, speech difficulty, weakness and numbness.  Hematological: Negative.   Psychiatric/Behavioral: Negative.       Allergies  Codeine  Home Medications   Prior to Admission medications   Medication Sig Start Date End Date Taking? Authorizing Provider  DiphenhydrAMINE HCl (COMPLETE ALLERGY PO) Take 1 tablet by mouth daily.   Yes Historical Provider, MD  ibuprofen (ADVIL,MOTRIN) 200 MG tablet Take 800 mg by mouth every 6 (six) hours as needed for headache or moderate pain.   Yes Historical Provider, MD  insulin aspart (NOVOLOG) 100 UNIT/ML injection Inject 5 Units into the skin 3 (three) times daily with meals. Only if your blood sugar is > 200 01/26/13  Yes Mahima Pandey, MD  insulin glargine (LANTUS) 100 UNIT/ML injection Inject 0.2 mLs (20 Units total) into the skin daily. 06/02/13  Yes Mahima Glade LloydPandey, MD  metFORMIN (GLUCOPHAGE) 500 MG tablet Take 2 tablets (1,000 mg total) by mouth 2 (two) times daily with a meal. 05/10/13  Yes Mahima Pandey, MD  PSEUDOEPHEDRINE HCL PO Take 2 tablets by mouth at bedtime as needed (for stuffy nose).   Yes Historical Provider, MD  fluticasone Aleda Grana(FLONASE)  50 MCG/ACT nasal spray Place 2 sprays into both nostrils daily. 08/28/13   Agostino Gorin, MD   BP 140/85  Pulse 69  Temp(Src) 97.9 F (36.6 C) (Oral)  Resp 20  SpO2 100%  LMP 08/23/2013 Physical Exam  Constitutional: She is oriented to person, place, and time. She appears well-developed and well-nourished. No distress.  HENT:  Head: Normocephalic and atraumatic.  Right Ear: External ear and ear canal  normal.  Left Ear: External ear and ear canal normal.  Nose: Nose normal.  Mouth/Throat: Oropharynx is clear and moist.  Has tenderness to palpation on maxillary, ethmoid, and frontal sinus regions bilaterally.    Eyes: Conjunctivae and EOM are normal. Pupils are equal, round, and reactive to light. Right eye exhibits no discharge. Left eye exhibits no discharge. No scleral icterus.  Neck: Normal range of motion. Neck supple. No JVD present. No thyromegaly present.  Cardiovascular: Normal rate, regular rhythm, S1 normal, S2 normal, normal heart sounds and intact distal pulses.  Exam reveals no gallop and no friction rub.   No murmur heard. Pulmonary/Chest: Effort normal and breath sounds normal. No respiratory distress. She has no wheezes. She has no rales. She exhibits no tenderness.  Abdominal: Soft. Bowel sounds are normal. She exhibits no distension and no mass. There is no tenderness. There is no rebound and no guarding.  Musculoskeletal: Normal range of motion. She exhibits no edema and no tenderness.  Lymphadenopathy:    She has no cervical adenopathy.  Neurological: She is alert and oriented to person, place, and time. She has normal strength and normal reflexes. No cranial nerve deficit or sensory deficit.  Normal finger-to-nose exam, normal heel-to-shin, no pronator drift, normal alternating movement, normal gait.  Skin: No rash noted. She is not diaphoretic. No erythema. No pallor.  Psychiatric: She has a normal mood and affect. Her behavior is normal.    ED Course  Procedures (including critical care time) Labs Review  Labs Reviewed - No data to display  Imaging Review No results found.   EKG Interpretation None     Patient is afebrile with  Normal vitals. Doesn't appear to be congested on HEENT exam. Only has mild sinus tenderness.  MDM   Final diagnoses:  Sinus congestion   Patient's intermittent dizziness with sinus pressure/frontal headache likely due to  increased middle ear pressure from sinus congestion causing dizziness. Bacterial sinusitis less likely given no other HEENT symptoms or fever. Will give her flonase and she can continue to use her OTC sinus meds (sudafed, mucinex, etc.)    Hyacinth Meeker, MD 08/28/13 1256

## 2013-08-29 NOTE — ED Provider Notes (Signed)
I saw and evaluated the patient, reviewed the resident's note and I agree with the findings and plan.   .Face to face Exam:  General:  Awake HEENT:  Atraumatic Resp:  Normal effort Abd:  Nondistended Neuro:No focal weakness  Nelia Shiobert L Tiffinie Caillier, MD 08/29/13 [phone]

## 2013-09-19 ENCOUNTER — Emergency Department (HOSPITAL_COMMUNITY)
Admission: EM | Admit: 2013-09-19 | Discharge: 2013-09-19 | Disposition: A | Discharge status: Home / Self Care | Payer: BC Managed Care – PPO | Attending: Emergency Medicine | Admitting: Emergency Medicine

## 2013-09-19 DIAGNOSIS — Z79899 Other long term (current) drug therapy: Secondary | ICD-10-CM | POA: Insufficient documentation

## 2013-09-19 DIAGNOSIS — R739 Hyperglycemia, unspecified: Secondary | ICD-10-CM

## 2013-09-19 DIAGNOSIS — Z8659 Personal history of other mental and behavioral disorders: Secondary | ICD-10-CM | POA: Insufficient documentation

## 2013-09-19 DIAGNOSIS — Z794 Long term (current) use of insulin: Secondary | ICD-10-CM | POA: Insufficient documentation

## 2013-09-19 DIAGNOSIS — E119 Type 2 diabetes mellitus without complications: Secondary | ICD-10-CM | POA: Insufficient documentation

## 2013-09-19 DIAGNOSIS — Z3202 Encounter for pregnancy test, result negative: Secondary | ICD-10-CM | POA: Insufficient documentation

## 2013-09-19 LAB — CBC
HCT: 35.3 % — ABNORMAL LOW (ref 36.0–46.0)
Hemoglobin: 12.8 g/dL (ref 12.0–15.0)
MCH: 29.6 pg (ref 26.0–34.0)
MCHC: 36.3 g/dL — AB (ref 30.0–36.0)
MCV: 81.7 fL (ref 78.0–100.0)
PLATELETS: 234 10*3/uL (ref 150–400)
RBC: 4.32 MIL/uL (ref 3.87–5.11)
RDW: 12.6 % (ref 11.5–15.5)
WBC: 6.5 10*3/uL (ref 4.0–10.5)

## 2013-09-19 LAB — COMPREHENSIVE METABOLIC PANEL
ALT: 25 U/L (ref 0–35)
AST: 26 U/L (ref 0–37)
Albumin: 3.4 g/dL — ABNORMAL LOW (ref 3.5–5.2)
Alkaline Phosphatase: 130 U/L — ABNORMAL HIGH (ref 39–117)
Anion gap: 14 (ref 5–15)
BUN: 10 mg/dL (ref 6–23)
CO2: 22 meq/L (ref 19–32)
Calcium: 9.5 mg/dL (ref 8.4–10.5)
Chloride: 94 mEq/L — ABNORMAL LOW (ref 96–112)
Creatinine, Ser: 0.44 mg/dL — ABNORMAL LOW (ref 0.50–1.10)
Glucose, Bld: 477 mg/dL — ABNORMAL HIGH (ref 70–99)
Potassium: 4.1 mEq/L (ref 3.7–5.3)
Sodium: 130 mEq/L — ABNORMAL LOW (ref 137–147)
TOTAL PROTEIN: 9.1 g/dL — AB (ref 6.0–8.3)
Total Bilirubin: 0.8 mg/dL (ref 0.3–1.2)

## 2013-09-19 LAB — URINE MICROSCOPIC-ADD ON

## 2013-09-19 LAB — URINALYSIS, ROUTINE W REFLEX MICROSCOPIC
Bilirubin Urine: NEGATIVE
Hgb urine dipstick: NEGATIVE
Ketones, ur: 15 mg/dL — AB
LEUKOCYTES UA: NEGATIVE
Nitrite: NEGATIVE
PROTEIN: NEGATIVE mg/dL
Specific Gravity, Urine: 1.044 — ABNORMAL HIGH (ref 1.005–1.030)
UROBILINOGEN UA: 1 mg/dL (ref 0.0–1.0)
pH: 6 (ref 5.0–8.0)

## 2013-09-19 LAB — CBG MONITORING, ED
Glucose-Capillary: 418 mg/dL — ABNORMAL HIGH (ref 70–99)
Glucose-Capillary: 461 mg/dL — ABNORMAL HIGH (ref 70–99)
Glucose-Capillary: 260 mg/dL — ABNORMAL HIGH (ref 70–99)

## 2013-09-19 LAB — POC URINE PREG, ED: PREG TEST UR: NEGATIVE

## 2013-09-19 MED ORDER — INSULIN REGULAR HUMAN 100 UNIT/ML IJ SOLN: 5.0000 [IU] | Freq: Three times a day (TID) | INTRAMUSCULAR | Status: DC

## 2013-09-19 MED ORDER — SODIUM CHLORIDE 0.9 % IV BOLUS (SEPSIS)
1000.0000 mL | Freq: Once | INTRAVENOUS | Status: AC
  Administered 2013-09-19: 1000 mL via INTRAVENOUS

## 2013-09-19 MED ORDER — INSULIN ASPART 100 UNIT/ML ~~LOC~~ SOLN
10.0000 [IU] | Freq: Once | SUBCUTANEOUS | Status: AC
  Administered 2013-09-19: 10 [IU] via INTRAVENOUS
  Filled 2013-09-19: qty 1

## 2013-09-19 MED ORDER — MORPHINE SULFATE 4 MG/ML IJ SOLN
4.0000 mg | Freq: Once | INTRAMUSCULAR | Status: AC
  Administered 2013-09-19: 4 mg via INTRAVENOUS
  Filled 2013-09-19: qty 1

## 2013-09-19 MED ORDER — ONDANSETRON HCL 4 MG/2ML IJ SOLN
4.0000 mg | Freq: Once | INTRAMUSCULAR | Status: AC
  Administered 2013-09-19: 4 mg via INTRAVENOUS
  Filled 2013-09-19: qty 2

## 2013-09-19 NOTE — Progress Notes (Signed)
CARE MANAGEMENT NOTE 09/19/2013  Patient:  [name],[name]   Account Number:  [id number]  Date Initiated:  09/19/2013  Documentation initiated by:  [facility]Aydan Levitz  Subjective/Objective Assessment:   diabetes     Action/Plan:   Anticipated DC Date:  09/19/2013   Anticipated DC Plan:  HOME/SELF CARE      DC Planning Services  CM consult  Medication Assistance      Choice offered to / List presented to:             Status of service:  Completed, signed off Medicare Important Message given?   (If response is "NO", the following Medicare IM given date fields will be blank) Date Medicare IM given:   Medicare IM given by:   Date Additional Medicare IM given:   Additional Medicare IM given by:    Discharge Disposition:  HOME/SELF CARE  Per UR Regulation:    If discussed at Long Length of Stay Meetings, dates discussed:    Comments:  09/19/2013 1300 NCM spoke to pt and states she recently lost her insurance coverage. States she changed her hours at work. Contacted [employer] and they have a Novolin R for $24 per vial. Pt states she can afford that price. She has Lantus and Metformin at home. Explained pt she can arrange appt with [facility] if she cannot afford copay with her PCP. Pt will follow up with PCP on Monday on her copay price without price. Isidoro DonningAlesia Aerabella Galasso RN CCM Case Mgmt phone [phone]

## 2013-09-19 NOTE — ED Notes (Addendum)
Pt c/o hypergylcemia, CBG at home 428, pt c/o headache, blurred vision, polydipsia, polyphagia, polyuria. CBG 418 in ED.

## 2013-09-19 NOTE — Discharge Instructions (Signed)
Blood Glucose Monitoring °Monitoring your blood glucose (also know as blood sugar) helps you to manage your diabetes. It also helps you and your health care provider monitor your diabetes and determine how well your treatment plan is working. °WHY SHOULD YOU MONITOR YOUR BLOOD GLUCOSE? °· It can help you understand how food, exercise, and medicine affect your blood glucose. °· It allows you to know what your blood glucose is at any given moment. You can quickly tell if you are having low blood glucose (hypoglycemia) or high blood glucose (hyperglycemia). °· It can help you and your health care provider know how to adjust your medicines. °· It can help you understand how to manage an illness or adjust medicine for exercise. °WHEN SHOULD YOU TEST? °Your health care provider will help you decide how often you should check your blood glucose. This may depend on the type of diabetes you have, your diabetes control, or the types of medicines you are taking. Be sure to write down all of your blood glucose readings so that this information can be reviewed with your health care provider. See below for examples of testing times that your health care provider may suggest. °Type 1 Diabetes °· Test 4 times a day if you are in good control, using an insulin pump, or perform multiple daily injections. °· If your diabetes is not well controlled or if you are sick, you may need to monitor more often. °· It is a good idea to also monitor: °· Before and after exercise. °· Between meals and 2 hours after a meal. °· Occasionally between 2:00 a.m. and 3:00 a.m. °Type 2 Diabetes °· It can vary with each person, but generally, if you are on insulin, test 4 times a day. °· If you take medicines by mouth (orally), test 2 times a day. °· If you are on a controlled diet, test once a day. °· If your diabetes is not well controlled or if you are sick, you may need to monitor more often. °HOW TO MONITOR YOUR BLOOD GLUCOSE °Supplies  Needed °· Blood glucose meter. °· Test strips for your meter. Each meter has its own strips. You must use the strips that go with your own meter. °· A pricking needle (lancet). °· A device that holds the lancet (lancing device). °· A journal or log book to write down your results. °Procedure °· Wash your hands with soap and water. Alcohol is not preferred. °· Prick the side of your finger (not the tip) with the lancet. °· Gently milk the finger until a small drop of blood appears. °· Follow the instructions that come with your meter for inserting the test strip, applying blood to the strip, and using your blood glucose meter. °Other Areas to Get Blood for Testing °Some meters allow you to use other areas of your body (other than your finger) to test your blood. These areas are called alternative sites. The most common alternative sites are: °· The forearm. °· The thigh. °· The back area of the lower leg. °· The palm of the hand. °The blood flow in these areas is slower. Therefore, the blood glucose values you get may be delayed, and the numbers are different from what you would get from your fingers. Do not use alternative sites if you think you are having hypoglycemia. Your reading will not be accurate. Always use a finger if you are having hypoglycemia. Also, if you cannot feel your lows (hypoglycemia unawareness), always use your fingers for your   blood glucose checks. °ADDITIONAL TIPS FOR GLUCOSE MONITORING °· Do not reuse lancets. °· Always carry your supplies with you. °· All blood glucose meters have a 24-hour "hotline" number to call if you have questions or need help. °· Adjust (calibrate) your blood glucose meter with a control solution after finishing a few boxes of strips. °BLOOD GLUCOSE RECORD KEEPING °It is a good idea to keep a daily record or log of your blood glucose readings. Most glucose meters, if not all, keep your glucose records stored in the meter. Some meters come with the ability to download  your records to your home computer. Keeping a record of your blood glucose readings is especially helpful if you are wanting to look for patterns. Make notes to go along with the blood glucose readings because you might forget what happened at that exact time. Keeping good records helps you and your health care provider to work together to achieve good diabetes management.  °Document Released: 01/24/2003 Document Revised: 06/07/2013 Document Reviewed: 06/15/2012 °[employer]® Patient Information ©2015 [employer]. This information is not intended to replace advice given to you by your health care provider. Make sure you discuss any questions you have with your health care provider. ° ° ° °Correction Insulin °Your health care provider has decided you need to take insulin regularly. You have been given a correction scale (also called a sliding scale) in case you need extra insulin when your blood sugar is too high (hyperglycemia). The following instructions will assist you in how to use that correction scale.  °WHAT IS A CORRECTION SCALE?      °When you check your blood sugar, sometimes it will be higher than your health care provider has told you it should be. You may need an extra dose of insulin to bring your blood sugar to the recommended level (also known as your goal, target, or normal level). The correction scale is prescribed by your health care provider based on your specific needs.  °Your correction scale has two parts:  °· The first shows you a blood sugar range.   °· The second part tells you how much extra insulin to give yourself if your blood sugar falls within this range. °You will not need an extra dose of insulin if your blood glucose is in the desired range. You should simply give yourself the normal amount of insulin that your health care provider has ordered for you.  °WHY IS IT IMPORTANT TO KEEP YOUR BLOOD SUGAR LEVELS AT YOUR DESIRED LEVEL?     °Keeping your blood sugar at the desired level helps  to prevent long-term complications of diabetes, such as eye disease, kidney failure, nerve damage, and other serious complications. °WHAT TYPE OF INSULIN WILL YOU USE?   °To help bring down blood sugar levels that are too high, your health care provider will prescribe a short-acting or a rapid-acting insulin. An example of a short-acting insulin would be regular insulin. Remember, you may also have a longer-acting insulin prescribed for you.  °WHAT DO YOU NEED TO DO?   °· Check your blood sugar with your home blood glucose meter as recommended by your health care provider.   °· Using your correction scale, find the range that your blood sugar lies in.   °· Look for the units of insulin that match that blood sugar range. Give yourself the dose of correction insulin your health care provider has prescribed. Always make sure you are using the right type of insulin.   °· Prior to the injection, make sure you have food available that you   can eat in the next 15-30 minutes.   °· If your correction insulin is rapid acting, start eating your meal within 15 minutes after you have given yourself the insulin injection. If you wait longer than 15 minutes to eat, your blood sugar might get too low.   °· If your correction insulin is short acting (regular), start eating your meal within 30 minutes after you have given yourself the insulin injection. If you wait longer than 30 minutes to eat, your blood sugar might get too low. Symptoms of low blood sugar (hypoglycemia) may include feeling shaky or weak, sweating, feeling confused, difficulty seeing, agitation, crankiness, or numbness of the lips or tongue. Check your blood sugar immediately and treat your results as directed by your health care provider.   °· Keep a log of your blood sugar results with the time you took the test and the amount of insulin that you injected. This information will help your health care provider manage your medicines.   °· Note on your log anything  that may affect your blood sugar level, such as:   °· Changes in normal exercise or activity.   °· Changes in your normal schedule, such as staying up late, going on vacation, changing your diet, or holidays.   °· New medicines. This includes prescription and over-the-counter medicines. Some medicines may cause high blood sugar.   °· Sickness, stress, or anxiety.   °· Changes in the time you took your medicine.   °· Changes in your meals, such as skipping a meal, having a late meal, or dining out.   °· Eating things that may affect blood glucose, such as snacks, meal portions that are larger than normal, drinks with sugar, or eating less than usual.   °· Ask your health care provider any questions you have. °· Be aware of "stacking" your insulin doses. This happens when you correct a high blood sugar level by giving yourself extra insulin too soon after a previous correction dose or mealtime dose. You may then have too much insulin still active in your body and may be at risk for hypoglycemia. °WHY DO YOU NEED A CORRECTION SCALE IF YOU HAVE NEVER BEEN DIAGNOSED WITH DIABETES?   °· Keeping your blood glucose in the target range is important for your overall health.   °· You may have been prescribed medicines that cause your blood glucose to be higher than normal. °WHEN SHOULD YOU SEEK MEDICAL CARE? °Contact your health care provider if:  °· You have experienced hypoglycemia that you are unable to treat with your usual routine.   °· You have a high blood sugar level that is not coming down with the correction dose. °· Your blood sugar is often too low or does not come up even if you eat a fast-acting carbohydrate.  °Someone who lives with you should seek immediate medical care if you become unresponsive. °Document Released: 06/14/2010 Document Revised: 09/23/2012 Document Reviewed: 07/03/2012 °[employer]® Patient Information ©2015 [employer]. This information is not intended to replace advice given to you by your  health care provider. Make sure you discuss any questions you have with your health care provider. ° ° ° °Hyperglycemia °Hyperglycemia occurs when the glucose (sugar) in your blood is too high. Hyperglycemia can happen for many reasons, but it most often happens to people who do not know they have diabetes or are not managing their diabetes properly.  °CAUSES  °Whether you have diabetes or not, there are other causes of hyperglycemia. Hyperglycemia can occur when you have diabetes, but it can also occur in other situations that you   might not be as aware of, such as: °Diabetes °· If you have diabetes and are having problems controlling your blood glucose, hyperglycemia could occur because of some of the following reasons: °¨ Not following your meal plan. °¨ Not taking your diabetes medications or not taking it properly. °¨ Exercising less or doing less activity than you normally do. °¨ Being sick. °Pre-diabetes °· This cannot be ignored. Before people develop Type 2 diabetes, they almost always have "pre-diabetes." This is when your blood glucose levels are higher than normal, but not yet high enough to be diagnosed as diabetes. Research has shown that some long-term damage to the body, especially the heart and circulatory system, may already be occurring during pre-diabetes. If you take action to manage your blood glucose when you have pre-diabetes, you may delay or prevent Type 2 diabetes from developing. °Stress °· If you have diabetes, you may be "diet" controlled or on oral medications or insulin to control your diabetes. However, you may find that your blood glucose is higher than usual in the hospital whether you have diabetes or not. This is often referred to as "stress hyperglycemia." Stress can elevate your blood glucose. This happens because of hormones put out by the body during times of stress. If stress has been the cause of your high blood glucose, it can be followed regularly by your caregiver. That  way he/she can make sure your hyperglycemia does not continue to get worse or progress to diabetes. °Steroids °· Steroids are medications that act on the infection fighting system (immune system) to block inflammation or infection. One side effect can be a rise in blood glucose. Most people can produce enough extra insulin to allow for this rise, but for those who cannot, steroids make blood glucose levels go even higher. It is not unusual for steroid treatments to "uncover" diabetes that is developing. It is not always possible to determine if the hyperglycemia will go away after the steroids are stopped. A special blood test called an A1c is sometimes done to determine if your blood glucose was elevated before the steroids were started. °SYMPTOMS °· Thirsty. °· Frequent urination. °· Dry mouth. °· Blurred vision. °· Tired or fatigue. °· Weakness. °· Sleepy. °· Tingling in feet or leg. °DIAGNOSIS  °Diagnosis is made by monitoring blood glucose in one or all of the following ways: °· A1c test. This is a chemical found in your blood. °· Fingerstick blood glucose monitoring. °· Laboratory results. °TREATMENT  °First, knowing the cause of the hyperglycemia is important before the hyperglycemia can be treated. Treatment may include, but is not be limited to: °· Education. °· Change or adjustment in medications. °· Change or adjustment in meal plan. °· Treatment for an illness, infection, etc. °· More frequent blood glucose monitoring. °· Change in exercise plan. °· Decreasing or stopping steroids. °· Lifestyle changes. °HOME CARE INSTRUCTIONS  °· Test your blood glucose as directed. °· Exercise regularly. Your caregiver will give you instructions about exercise. Pre-diabetes or diabetes which comes on with stress is helped by exercising. °· Eat wholesome, balanced meals. Eat often and at regular, fixed times. Your caregiver or nutritionist will give you a meal plan to guide your sugar intake. °· Being at an ideal weight  is important. If needed, losing as little as 10 to 15 pounds may help improve blood glucose levels. °SEEK MEDICAL CARE IF:  °· You have questions about medicine, activity, or diet. °· You continue to have symptoms (problems such as   increased thirst, urination, or weight gain). °SEEK IMMEDIATE MEDICAL CARE IF:  °· You are vomiting or have diarrhea. °· Your breath smells fruity. °· You are breathing faster or slower. °· You are very sleepy or incoherent. °· You have numbness, tingling, or pain in your feet or hands. °· You have chest pain. °· Your symptoms get worse even though you have been following your caregiver's orders. °· If you have any other questions or concerns. °Document Released: 07/17/2000 Document Revised: 04/15/2011 Document Reviewed: 05/20/2011 °[employer]® Patient Information ©2015 [employer]. This information is not intended to replace advice given to you by your health care provider. Make sure you discuss any questions you have with your health care provider. ° °

## 2013-09-19 NOTE — ED Provider Notes (Signed)
TIME SEEN: 10:30 AM  CHIEF COMPLAINT: Hyperglycemia  HPI: Patient is a 43 y.o. F with history of insulin-dependent diabetes who presents emergency department with hyperglycemia. She states she's had mild diffuse throbbing headache, a revision, fatigue, polyuria and polydipsia for the past several days. She states that she cannot get her blood sugar controlled. She is on Lantus 20 units once daily and metformin 500 mg twice daily. She states that she takes NovoLog 5 units as needed with meals if her glucoses greater than 250. She states for the past several weeks she has not been able to afford this medication and she does not have insurance. Denies any vomiting or diarrhea. She has had right upper quadrant pain that has been intermittent and ongoing for the past several months. She was recently diagnosed with cholelithiasis. Denies any fever. No chest pain or shortness of breath.  ROS: See HPI Constitutional: no fever  Eyes: no drainage  ENT: no runny nose   Cardiovascular:  no chest pain  Resp: no SOB  GI: no vomiting GU: no dysuria Integumentary: no rash  Allergy: no hives  Musculoskeletal: no leg swelling  Neurological: no slurred speech ROS otherwise negative  PAST MEDICAL HISTORY/PAST SURGICAL HISTORY:  Past Medical History  Diagnosis Date  . Diabetes mellitus   . Morbid obesity   . Depression   . Allergy     MEDICATIONS:  Prior to Admission medications   Medication Sig Start Date End Date Taking? Authorizing Provider  fexofenadine (ALLEGRA) 180 MG tablet Take 180 mg by mouth at bedtime.   Yes Historical Provider, MD  insulin aspart (NOVOLOG) 100 UNIT/ML injection Inject 5 Units into the skin 3 (three) times daily with meals. Only if your blood sugar is > 200 01/26/13  Yes Mahima Pandey, MD  insulin glargine (LANTUS) 100 UNIT/ML injection Inject 0.2 mLs (20 Units total) into the skin daily. 06/02/13  Yes Mahima Glade Lloyd, MD  metFORMIN (GLUCOPHAGE) 500 MG tablet Take 2 tablets  (1,000 mg total) by mouth 2 (two) times daily with a meal. 05/10/13  Yes Oneal Grout, MD    ALLERGIES:  Allergies  Allergen Reactions  . Codeine Shortness Of Breath    SOCIAL HISTORY:  History  Substance Use Topics  . Smoking status: Never Smoker   . Smokeless tobacco: Never Used  . Alcohol Use: No    FAMILY HISTORY: Family History  Problem Relation Age of Onset  . Diabetes Mother   . Lymphoma Father   . Lymphoma Brother     EXAM: BP 132/64  Pulse 85  Temp(Src) 97.7 F (36.5 C) (Oral)  Resp 16  SpO2 100%  LMP 08/24/2013 CONSTITUTIONAL: Alert and oriented and responds appropriately to questions. Well-appearing; well-nourished HEAD: Normocephalic EYES: Conjunctivae clear, PERRL ENT: normal nose; no rhinorrhea; moist mucous membranes; pharynx without lesions noted NECK: Supple, no meningismus, no LAD  CARD: RRR; S1 and S2 appreciated; no murmurs, no clicks, no rubs, no gallops RESP: Normal chest excursion without splinting or tachypnea; breath sounds clear and equal bilaterally; no wheezes, no rhonchi, no rales,  ABD/GI: Normal bowel sounds; non-distended; soft, mildly tender to palpation the right upper quadrant without guarding or rebound, negative Murphy sign, no peritoneal signs BACK:  The back appears normal and is non-tender to palpation, there is no CVA tenderness EXT: Normal ROM in all joints; non-tender to palpation; no edema; normal capillary refill; no cyanosis    SKIN: Normal color for age and race; warm NEURO: Moves all extremities equally PSYCH: The patient's  mood and manner are appropriate. Grooming and personal hygiene are appropriate.  MEDICAL DECISION MAKING: Patient here with hyperglycemia likely from not having her NovoLog. Labs show hyperglycemia but normal anion gap. We will attempt to control glucose with IV fluids.  She does have right upper quadrant pain on exam but states that this is been present for several months intermittent. No symptoms to  suggest cholecystitis. Lipase, LFTs normal. No leukocytosis. Afebrile in the ED. I do not feel she needs repeat abdominal imaging today. We'll give pain and nausea medicine.  ED PROGRESS: Glucose is not improved with 2 L IV fluids. Given IV insulin which improved her glucose to less than 300.  Case management has seen patient and are helping her with outpatient resources. Case management reports patient can afford Novolin R from [facility].  Will give her this prescription. Have discussed return precautions and supportive care instructions. She verbalizes understanding is comfortable plan. She states she is feeling much better.    Layla MawKristen N Alexiz Cothran, DO 09/19/13 [id number]

## 2013-09-28 ENCOUNTER — Other Ambulatory Visit: Payer: Self-pay | Admitting: *Deleted

## 2013-09-28 DIAGNOSIS — E119 Type 2 diabetes mellitus without complications: Secondary | ICD-10-CM

## 2013-10-07 ENCOUNTER — Other Ambulatory Visit: Payer: BC Managed Care – PPO

## 2013-10-13 ENCOUNTER — Ambulatory Visit: Payer: BC Managed Care – PPO | Admitting: Internal Medicine

## 2013-10-13 ENCOUNTER — Encounter: Payer: Self-pay | Admitting: Internal Medicine

## 2013-10-13 ENCOUNTER — Telehealth: Payer: Self-pay | Admitting: Internal Medicine

## 2013-10-13 DIAGNOSIS — Z0289 Encounter for other administrative examinations: Secondary | ICD-10-CM

## 2013-10-13 NOTE — Telephone Encounter (Signed)
Called [name] regarding her missed appointments.  No answer x 2....  sent request to Kathie Rhodes, to  Send no show letter regarding. .[name]

## 2013-10-26 ENCOUNTER — Encounter (HOSPITAL_COMMUNITY): Payer: Self-pay | Admitting: Emergency Medicine

## 2013-10-26 ENCOUNTER — Emergency Department (HOSPITAL_COMMUNITY)
Admission: EM | Admit: 2013-10-26 | Discharge: 2013-10-26 | Disposition: A | Discharge status: Home / Self Care | Payer: BC Managed Care – PPO | Attending: Emergency Medicine | Admitting: Emergency Medicine

## 2013-10-26 DIAGNOSIS — Z79899 Other long term (current) drug therapy: Secondary | ICD-10-CM | POA: Insufficient documentation

## 2013-10-26 DIAGNOSIS — Z8659 Personal history of other mental and behavioral disorders: Secondary | ICD-10-CM | POA: Insufficient documentation

## 2013-10-26 DIAGNOSIS — L0291 Cutaneous abscess, unspecified: Secondary | ICD-10-CM

## 2013-10-26 DIAGNOSIS — Z794 Long term (current) use of insulin: Secondary | ICD-10-CM | POA: Insufficient documentation

## 2013-10-26 DIAGNOSIS — N61 Mastitis without abscess: Secondary | ICD-10-CM | POA: Insufficient documentation

## 2013-10-26 DIAGNOSIS — E119 Type 2 diabetes mellitus without complications: Secondary | ICD-10-CM | POA: Insufficient documentation

## 2013-10-26 MED ORDER — LIDOCAINE HCL 1 % IJ SOLN
INTRAMUSCULAR | Status: AC
  Administered 2013-10-26: 20 mL
  Filled 2013-10-26: qty 20

## 2013-10-26 MED ORDER — TRAMADOL HCL 50 MG PO TABS: 50.0000 mg | ORAL_TABLET | Freq: Four times a day (QID) | ORAL | Status: DC | PRN

## 2013-10-26 MED ORDER — CLINDAMYCIN HCL 150 MG PO CAPS
300.0000 mg | ORAL_CAPSULE | Freq: Three times a day (TID) | ORAL | Status: DC
Start: 2013-10-26 — End: 2014-04-15

## 2013-10-26 NOTE — Discharge Instructions (Signed)

## 2013-10-26 NOTE — ED Notes (Signed)
Pt c/o increasing abscess under R breast x 3 day.  Pain score 8/10.

## 2013-10-26 NOTE — ED Provider Notes (Signed)
CSN: [id number]     Arrival date & time 10/26/13  1718 History  This chart was scribed for non-physician practitioner, Junious Silk, PA-C working with Rolland Porter, MD by Greggory Stallion, ED scribe. This patient was seen in room WTR8/WTR8 and the patient's care was started at 5:53 PM.   Chief Complaint  Patient presents with  . Abscess   The history is provided by the patient. No language interpreter was used.   HPI Comments: [name] is a 43 y.o. female who presents to the Emergency Department complaining of an abscess under her right breast that started 3 days ago. Denies any drainage from the area. States pain and swelling have increased over the last few days. Pt has history of abscesses in the same area but has had to be admitted due to overlying cellulitis.  Past Medical History  Diagnosis Date  . Diabetes mellitus   . Morbid obesity   . Depression   . Allergy    Past Surgical History  Procedure Laterality Date  . Tubal ligation     Family History  Problem Relation Age of Onset  . Diabetes Mother   . Lymphoma Father   . Lymphoma Brother    History  Substance Use Topics  . Smoking status: Never Smoker   . Smokeless tobacco: Never Used  . Alcohol Use: No   OB History   Grav Para Term Preterm Abortions TAB SAB Ect Mult Living                 Review of Systems  Skin: Positive for color change.       Abscess  All other systems reviewed and are negative.  Allergies  Codeine  Home Medications   Prior to Admission medications   Medication Sig Start Date End Date Taking? Authorizing Provider  ibuprofen (ADVIL,MOTRIN) 200 MG tablet Take 800 mg by mouth every 6 (six) hours as needed.   Yes Historical Provider, MD  insulin glargine (LANTUS) 100 UNIT/ML injection Inject 20 Units into the skin every evening. 06/02/13  Yes Mahima Pandey, MD  insulin regular (NOVOLIN R) 100 units/mL injection Inject 0.05 mLs (5 Units total) into the skin 3 (three) times daily before  meals. As needed for glucose over 250 09/19/13  Yes Kristen N Ward, DO  metFORMIN (GLUCOPHAGE) 500 MG tablet Take 500 mg by mouth 2 (two) times daily with a meal. 05/10/13  Yes Mahima Pandey, MD   BP 140/96  Pulse 81  Temp(Src) 98.4 F (36.9 C) (Oral)  Resp 20  SpO2 99%  LMP 10/21/2013  Physical Exam  Nursing note and vitals reviewed. Constitutional: She is oriented to person, place, and time. She appears well-developed and well-nourished. No distress.  HENT:  Head: Normocephalic and atraumatic.  Right Ear: External ear normal.  Left Ear: External ear normal.  Nose: Nose normal.  Mouth/Throat: Oropharynx is clear and moist.  Eyes: Conjunctivae are normal.  Neck: Normal range of motion.  Cardiovascular: Normal rate, regular rhythm and normal heart sounds.   Pulmonary/Chest: Effort normal and breath sounds normal. No stridor. No respiratory distress. She has no wheezes. She has no rales.  Abdominal: Soft. She exhibits no distension.  Musculoskeletal: Normal range of motion.  Neurological: She is alert and oriented to person, place, and time. She has normal strength.  Skin: Skin is warm and dry. She is not diaphoretic. No erythema.  3 cm area of induration under right breast with central flutuance. No drainage.   Psychiatric: She has  a normal mood and affect. Her behavior is normal.    ED Course  Procedures (including critical care time)  INCISION AND DRAINAGE Performed by: Junious Silk, PA-C Consent: Verbal consent obtained. Risks and benefits: risks, benefits and alternatives were discussed Type: abscess  Body area: right breast  Anesthesia: local infiltration  Incision was made with a scalpel.  Local anesthetic: lidocaine 1%  Anesthetic total: 3 ml  Complexity: complex Blunt dissection to break up loculations  Drainage: purulent  Drainage amount: copious  Patient tolerance: Patient tolerated the procedure well with no immediate complications.   DIAGNOSTIC  STUDIES: Oxygen Saturation is 99% on RA, normal by my interpretation.    COORDINATION OF CARE: 5:55 PM-Discussed treatment plan which includes I&D, pain medication and an antibiotic with pt at bedside and pt agreed to plan.   Labs Review Labs Reviewed - No data to display  Imaging Review No results found.   EKG Interpretation None      EMERGENCY DEPARTMENT US SOFT TISSUE INTERPRETATION "Study: Limited Ultrasound of the noted body part in comments below"  INDICATIONS: Soft tissue infection Multiple views of the body part are obtained with a multi-frequency linear probe  PERFORMED BY:  Myself  IMAGES ARCHIVED?: No  SIDE:Right   BODY PART:Abdominal wall  FINDINGS: Abcess present  LIMITATIONS:  Body Habitus and Emergent Procedure  INTERPRETATION:  Abcess present and No cellulitis noted  COMMENT:  Bedside US, abscess present   MDM   Final diagnoses:  Abscess    Patient with skin abscess amenable to incision and drainage.  Abscess was not large enough to warrant packing or drain,  wound recheck in 2 days. Encouraged home warm soaks and flushing.  Mild signs of cellulitis is surrounding skin.  Will d/c to home.  No antibiotic therapy is indicated.   I personally performed the services described in this documentation, which was scribed in my presence. The recorded information has been reviewed and is accurate.  Mora Bellman, PA-C 10/26/13 2054

## 2013-10-30 NOTE — ED Provider Notes (Signed)
Medical screening examination/treatment/procedure(s) were performed by non-physician practitioner and as supervising physician I was immediately available for consultation/collaboration.   EKG Interpretation None        Rhona Fusilier, MD 10/30/13 0830 

## 2013-11-05 ENCOUNTER — Encounter: Payer: Self-pay | Admitting: Internal Medicine

## 2013-11-05 ENCOUNTER — Telehealth: Payer: Self-pay | Admitting: Internal Medicine

## 2013-11-05 NOTE — Telephone Encounter (Signed)
Called Ms.  Eichenberger, regarding the number of No show appointments, she have had 4 appointments scheduled, she did Not show for any of them.   We can not give her best practice medical care if she does not come for her appointments.  Per patients phone- do not accept incoming calls.  Work #, she is no longer employed at [employer] any longer. We have sent a letter asking she call to reschedule appointment.  She did not respond.   Sending Discharge Letter.Marland Kitchen.Marland Kitchen.Per Dr. Glade LloydPandey... cdavis

## 2014-01-08 IMAGING — CR DG HIP COMPLETE 2+V*R*
3 series · 3 of 3 positions shown · non-contrast
Comparison: None.

CLINICAL DATA: Right hip pain for 3 days.  No known injury.

RIGHT HIP - COMPLETE 2+ VIEW

[t pelvis ap]
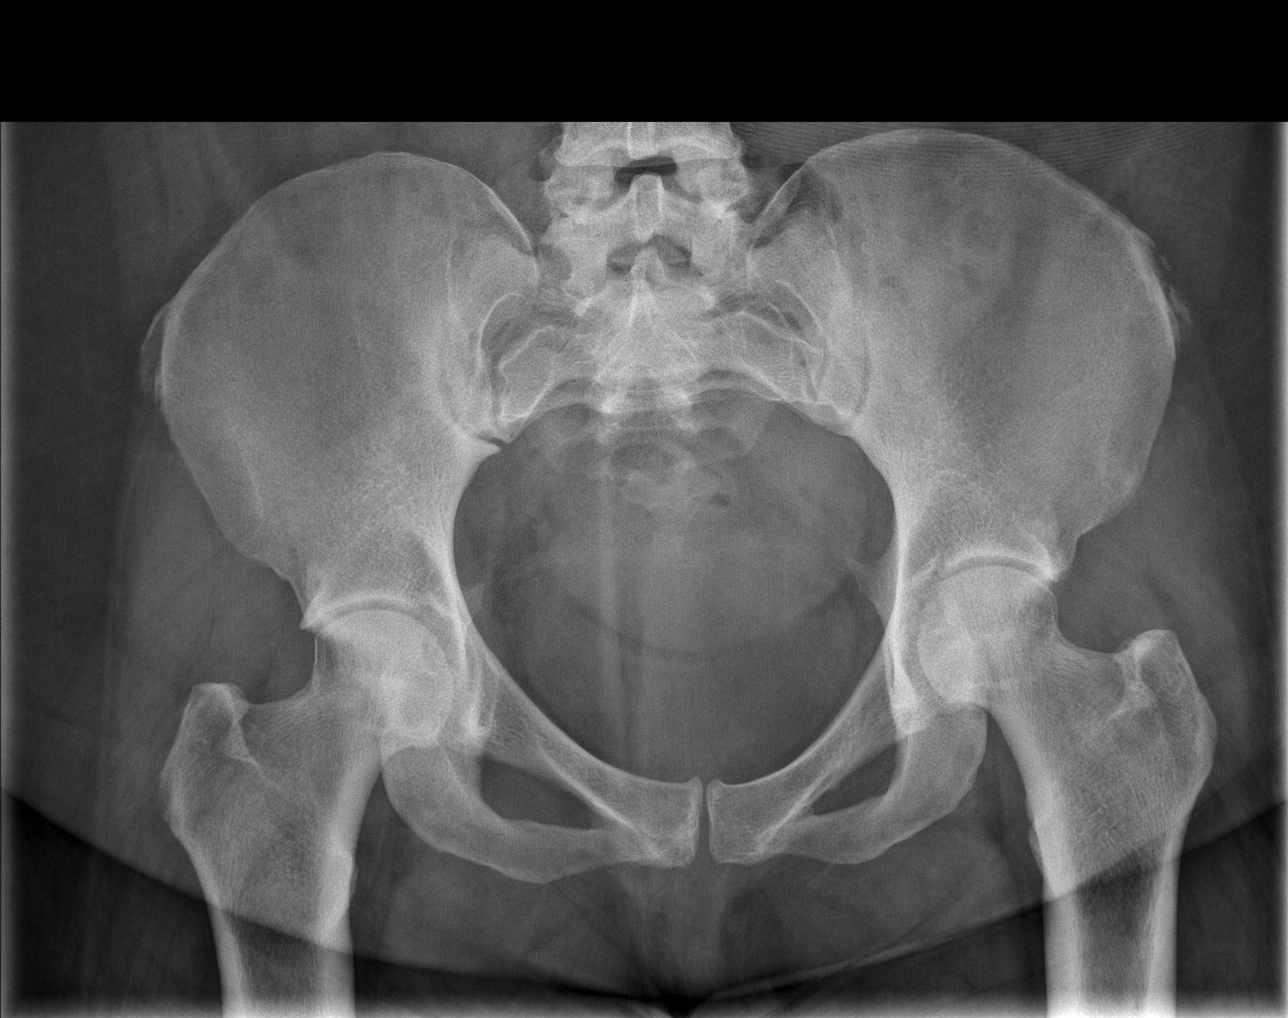

[t hip ap right]
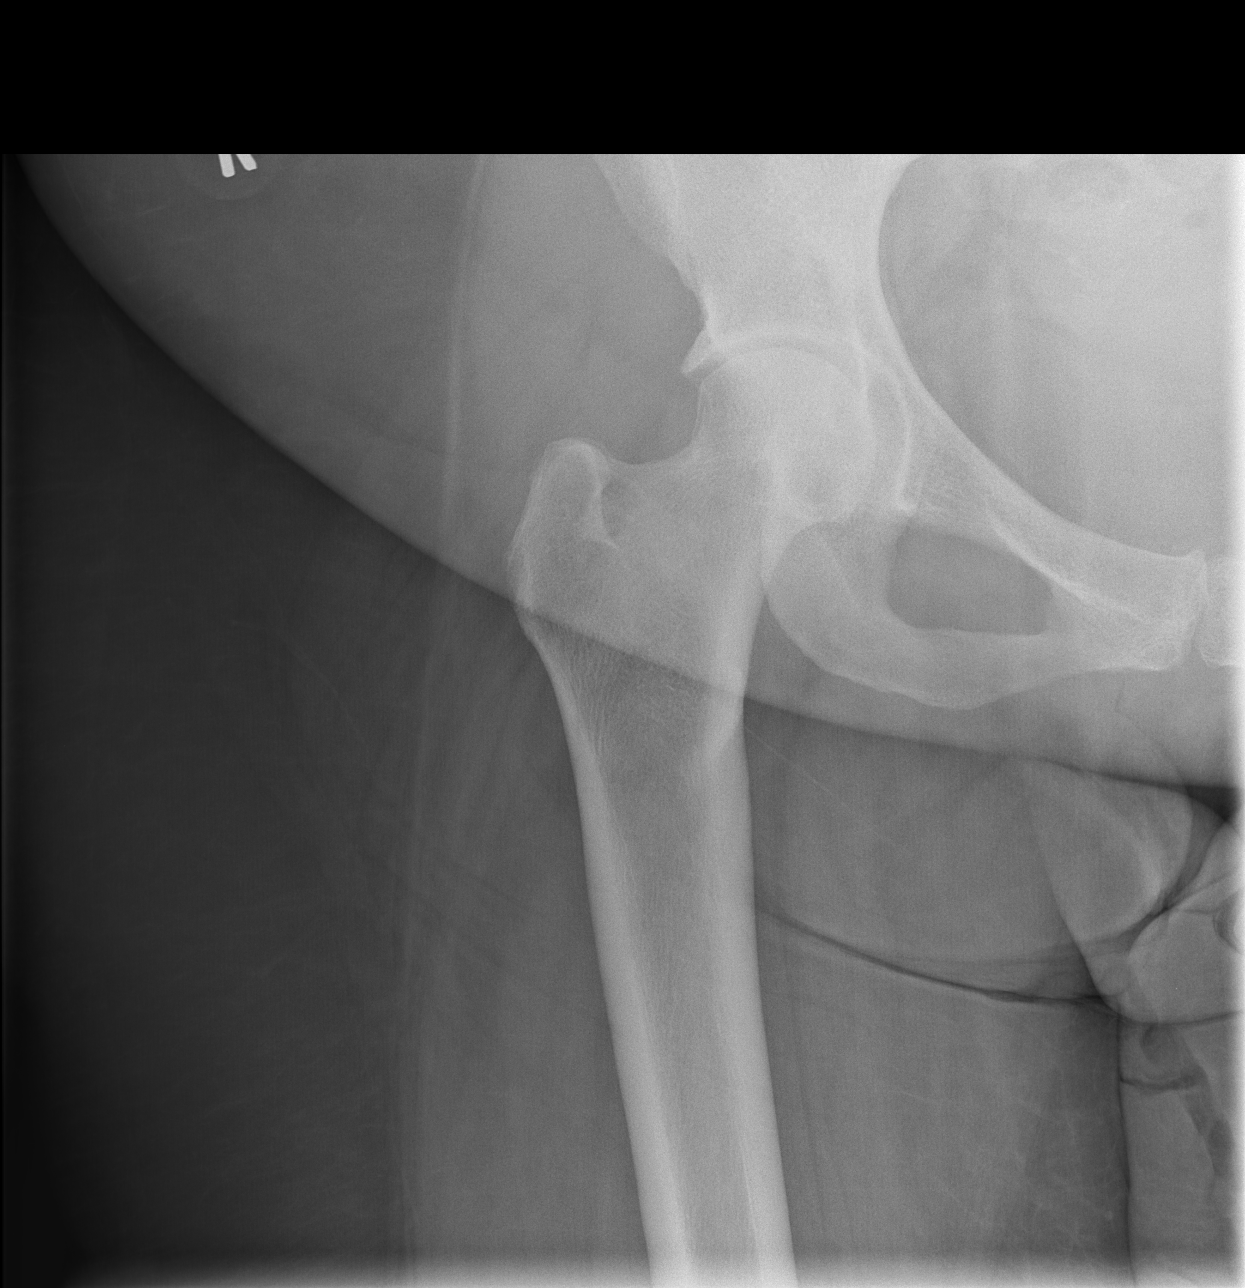

[t hip frog leg right]
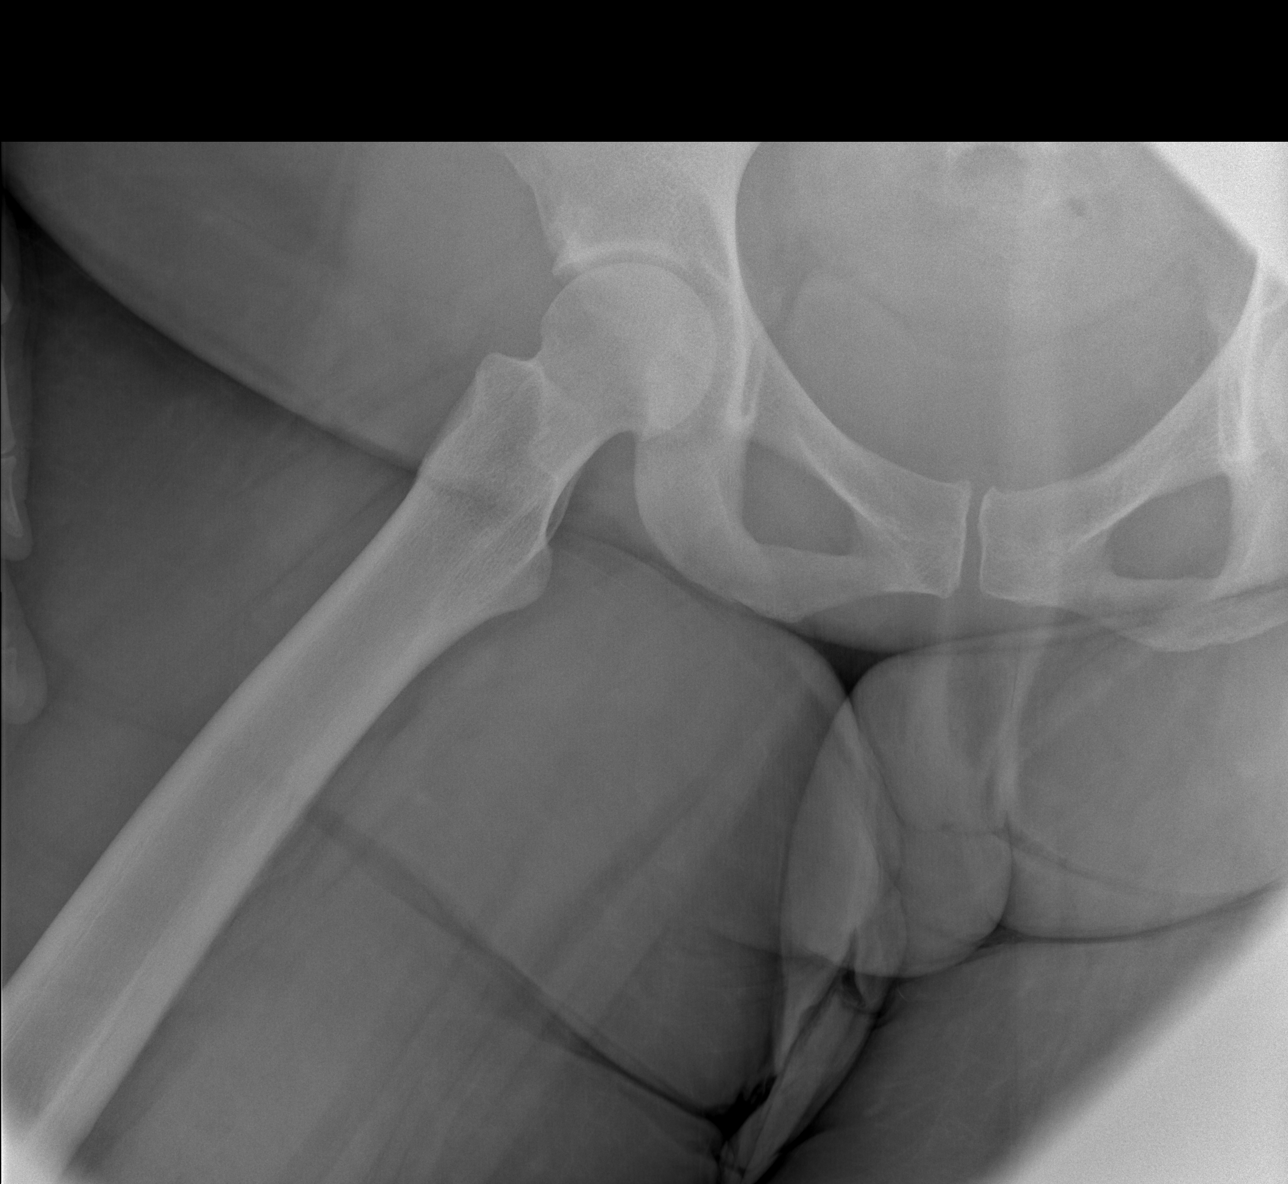

[3 of 3 positions shown; findings below may reference images not displayed]

FINDINGS: Both hips are located.  There is no fracture.  Soft
tissues are unremarkable.
IMPRESSION: No acute finding.  Stable compared prior.

## 2014-01-31 ENCOUNTER — Other Ambulatory Visit: Payer: Self-pay | Admitting: Internal Medicine

## 2014-03-22 ENCOUNTER — Emergency Department (HOSPITAL_COMMUNITY): Payer: Self-pay

## 2014-03-22 ENCOUNTER — Encounter (HOSPITAL_COMMUNITY): Payer: Self-pay | Admitting: *Deleted

## 2014-03-22 ENCOUNTER — Emergency Department (HOSPITAL_COMMUNITY)
Admission: EM | Admit: 2014-03-22 | Discharge: 2014-03-22 | Disposition: A | Discharge status: Home / Self Care | Payer: Self-pay | Attending: Emergency Medicine | Admitting: Emergency Medicine

## 2014-03-22 DIAGNOSIS — R609 Edema, unspecified: Secondary | ICD-10-CM

## 2014-03-22 DIAGNOSIS — R600 Localized edema: Secondary | ICD-10-CM | POA: Insufficient documentation

## 2014-03-22 DIAGNOSIS — Z79899 Other long term (current) drug therapy: Secondary | ICD-10-CM | POA: Insufficient documentation

## 2014-03-22 DIAGNOSIS — R0602 Shortness of breath: Secondary | ICD-10-CM | POA: Insufficient documentation

## 2014-03-22 DIAGNOSIS — F329 Major depressive disorder, single episode, unspecified: Secondary | ICD-10-CM | POA: Insufficient documentation

## 2014-03-22 DIAGNOSIS — Z794 Long term (current) use of insulin: Secondary | ICD-10-CM | POA: Insufficient documentation

## 2014-03-22 DIAGNOSIS — Z792 Long term (current) use of antibiotics: Secondary | ICD-10-CM | POA: Insufficient documentation

## 2014-03-22 DIAGNOSIS — E119 Type 2 diabetes mellitus without complications: Secondary | ICD-10-CM | POA: Insufficient documentation

## 2014-03-22 LAB — CBC
HCT: 33.5 % — ABNORMAL LOW (ref 36.0–46.0)
HEMOGLOBIN: 11.4 g/dL — AB (ref 12.0–15.0)
MCH: 28.4 pg (ref 26.0–34.0)
MCHC: 34 g/dL (ref 30.0–36.0)
MCV: 83.5 fL (ref 78.0–100.0)
Platelets: 187 10*3/uL (ref 150–400)
RBC: 4.01 MIL/uL (ref 3.87–5.11)
RDW: 13.4 % (ref 11.5–15.5)
WBC: 4.6 10*3/uL (ref 4.0–10.5)

## 2014-03-22 LAB — BRAIN NATRIURETIC PEPTIDE: B Natriuretic Peptide: 60.5 pg/mL (ref 0.0–100.0)

## 2014-03-22 LAB — BASIC METABOLIC PANEL
ANION GAP: 7 (ref 5–15)
BUN: 13 mg/dL (ref 6–23)
CO2: 27 mmol/L (ref 19–32)
CREATININE: 0.45 mg/dL — AB (ref 0.50–1.10)
Calcium: 8.9 mg/dL (ref 8.4–10.5)
Chloride: 104 mmol/L (ref 96–112)
Glucose, Bld: 114 mg/dL — ABNORMAL HIGH (ref 70–99)
POTASSIUM: 3.2 mmol/L — AB (ref 3.5–5.1)
Sodium: 138 mmol/L (ref 135–145)

## 2014-03-22 MED ORDER — POTASSIUM CHLORIDE CRYS ER 20 MEQ PO TBCR
40.0000 meq | EXTENDED_RELEASE_TABLET | Freq: Once | ORAL | Status: AC
  Administered 2014-03-22: 40 meq via ORAL
  Filled 2014-03-22: qty 2

## 2014-03-22 NOTE — ED Notes (Signed)
Pt reports bil leg swelling x1 week, leg pain 7/10. Reports SOB x1 day. Able to speak in full sentences.

## 2014-03-22 NOTE — ED Provider Notes (Signed)
CSN: [id number]     Arrival date & time 03/22/14  1658 History   First MD Initiated Contact with Patient 03/22/14 1947     Chief Complaint  Patient presents with  . leg swelling   . Shortness of Breath     (Consider location/radiation/quality/duration/timing/severity/associated sxs/prior Treatment) HPI    PCP: PANDEY, MAHIMA, MD Blood pressure 152/68, pulse 68, temperature 98.4 F (36.9 C), temperature source Oral, resp. rate 16, last menstrual period 03/05/2014, SpO2 100 %.  [name] is a 44 y.o.female with a significant PMH of diabetes, morbid obesity, depression and allergy  presents to the ER with complaints of bilateral lower extremity swelling- intermittent, shortness of breath on exertion, arthralgias, intermittent light headache, abdomen fullness. She reports the symptoms started 1 week ago. She has none of these symptoms currently during this ER visit. She has had relief with wearing compression stalking's. She has not had any CP or SOB at rest. She has a pulse ox at home and checks her oxygen frequently, denies having 02 sats less than 95% on room air at home. She admits to changing jobs a few months ago which has increased her being sedentary. She report sitting most of the day and sitting most the time when she gets home, the swelling is worse after sitting and improved with walking around.  Negative Review of Symptoms:  Denies weakness, confusion, fevers, CP, SOB at rest, hypoxia, nausea, vomiting, diarrhea, dysuria, abdominal pain, rash, change in vision, active headache.   Past Medical History  Diagnosis Date  . Diabetes mellitus   . Morbid obesity   . Depression   . Allergy    Past Surgical History  Procedure Laterality Date  . Tubal ligation     Family History  Problem Relation Age of Onset  . Diabetes Mother   . Lymphoma Father   . Lymphoma Brother    History  Substance Use Topics  . Smoking status: Never Smoker   . Smokeless tobacco: Never Used   . Alcohol Use: No   OB History    No data available     Review of Systems  10 Systems reviewed and are negative for acute change except as noted in the HPI.    Allergies  Codeine  Home Medications   Prior to Admission medications   Medication Sig Start Date End Date Taking? Authorizing Provider  ibuprofen (ADVIL,MOTRIN) 200 MG tablet Take 800 mg by mouth every 6 (six) hours as needed for moderate pain (pain).    Yes Historical Provider, MD  insulin glargine (LANTUS) 100 UNIT/ML injection Inject 20 Units into the skin every evening. 06/02/13  Yes Mahima Pandey, MD  insulin regular (NOVOLIN R) 100 units/mL injection Inject 0.05 mLs (5 Units total) into the skin 3 (three) times daily before meals. As needed for glucose over 250 09/19/13  Yes Kristen N Ward, DO  metFORMIN (GLUCOPHAGE) 500 MG tablet TAKE ONE TABLET BY MOUTH TWICE DAILY WITH MEALS 01/31/14  Yes Angelmarie Ponzo L Reed, DO  clindamycin (CLEOCIN) 150 MG capsule Take 2 capsules (300 mg total) by mouth 3 (three) times daily. May dispense as  capsules Patient not taking: Reported on 03/22/2014 10/26/13   Mora Bellman, PA-C  traMADol (ULTRAM) 50 MG tablet Take 1 tablet (50 mg total) by mouth every 6 (six) hours as needed. Patient not taking: Reported on 03/22/2014 10/26/13   Mora Bellman, PA-C   BP 168/86 mmHg  Pulse 59  Temp(Src) 98.4 F (36.9 C) (Oral)  Resp  10  SpO2 98%  LMP 03/05/2014 (Approximate) Physical Exam  Constitutional: She appears well-developed and well-nourished. No distress.  HENT:  Head: Normocephalic and atraumatic.  Eyes: Pupils are equal, round, and reactive to light.  Neck: Normal range of motion. Neck supple. No Brudzinski's sign and no Kernig's sign noted.  Cardiovascular: Normal rate and regular rhythm.   Pulmonary/Chest: Effort normal and breath sounds normal. She has no wheezes. She has no rales.  Abdominal: Soft.  Musculoskeletal:  No noticeable lower extremity edema  Neurological: She is  alert.  Cranial nerves II-VIII and X-XII evaluated and show no deficits. Pt alert and oriented x 3 Upper and lower extremity strength is symmetrical and physiologic Normal muscular tone No facial droop Coordination intact, no limb ataxia,     Skin: Skin is warm and dry. No abrasion, no bruising, no ecchymosis, no petechiae and no rash noted.  Nursing note and vitals reviewed.   ED Course  Procedures (including critical care time) Labs Review Labs Reviewed  BASIC METABOLIC PANEL - Abnormal; Notable for the following:    Potassium 3.2 (*)    Glucose, Bld 114 (*)    Creatinine, Ser 0.45 (*)    All other components within normal limits  CBC - Abnormal; Notable for the following:    Hemoglobin 11.4 (*)    HCT 33.5 (*)    All other components within normal limits  BRAIN NATRIURETIC PEPTIDE    Imaging Review Dg Chest 2 View  03/22/2014   CLINICAL DATA:  Bilateral lower extremity swelling. Shortness of breath and chest pain with exertion. Symptoms for 1 week.  EXAM: CHEST  2 VIEW  COMPARISON:  CT chest and PA and lateral chest 06/02/2011.  FINDINGS: Heart size and mediastinal contours are within normal limits. Both lungs are clear. Visualized skeletal structures are unremarkable.  IMPRESSION: Negative exam.   Electronically Signed   By: Drusilla Kanner M.D.   On: 03/22/2014 20:24     EKG Interpretation   Date/Time:  Tuesday March 22 2014 19:54:25 EST Ventricular Rate:  68 PR Interval:  180 QRS Duration: 93 QT Interval:  420 QTC Calculation: 447 R Axis:   24 Text Interpretation:  Sinus rhythm Low voltage, precordial leads Probable  anteroseptal infarct, old No significant change since last tracing  Confirmed by ALLEN  MD, ANTHONY ([id number]) on 03/22/2014 9:35:43 PM      MDM   Final diagnoses:  SOB (shortness of breath)    Pt has normal physical exam and unremarkable work-up here in the ED. Negative EKG, normal vitals signs, no chest pain or pressure. Her potassium is  mildly low, will replace with a dose of oral potassium in the ED. She is having multiple symptoms and it is unclear if they are due to viral etiology or possibly immune etiology.  However, no significant findings on exam or lab work that would warrant admission or further testing at this time. I will recommend she be more active with walking throughout the day and use her compression stalking's.  21:18 Vital Signs CG  Oxygen Therapy - SpO2: 98 % (while ambulating) ; O2 Device: Room Air     The patient maintains her sats while ambulating- in no distress. Pt needs to see her primary care doctor before the weekned for re-eval. Given strict return to ED precautions.  44 y.o.[name]'s evaluation in the Emergency Department is complete. It has been determined that no acute conditions requiring further emergency intervention are present at this time. The  patient/guardian have been advised of the diagnosis and plan. We have discussed signs and symptoms that warrant return to the ED, such as changes or worsening in symptoms.  Vital signs are stable at discharge. Filed Vitals:   03/22/14 2000  BP: 168/86  Pulse: 59  Temp:   Resp: 10    Patient/guardian has voiced understanding and agreed to follow-up with the PCP or specialist.   Dorthula Matasiffany G Davian Wollenberg, PA-C 03/22/14 2136  Toy BakerAnthony T Allen, MD 03/23/14 1049

## 2014-03-22 NOTE — Discharge Instructions (Signed)
Peripheral Edema °You have swelling in your legs (peripheral edema). This swelling is due to excess accumulation of salt and water in your body. Edema may be a sign of heart, kidney or liver disease, or a side effect of a medication. It may also be due to problems in the leg veins. Elevating your legs and using special support stockings may be very helpful, if the cause of the swelling is due to poor venous circulation. Avoid long periods of standing, whatever the cause. °Treatment of edema depends on identifying the cause. Chips, pretzels, pickles and other salty foods should be avoided. Restricting salt in your diet is almost always needed. Water pills (diuretics) are often used to remove the excess salt and water from your body via urine. These medicines prevent the kidney from reabsorbing sodium. This increases urine flow. °Diuretic treatment may also result in lowering of potassium levels in your body. Potassium supplements may be needed if you have to use diuretics daily. Daily weights can help you keep track of your progress in clearing your edema. You should call your caregiver for follow up care as recommended. °SEEK IMMEDIATE MEDICAL CARE IF:  °· You have increased swelling, pain, redness, or heat in your legs. °· You develop shortness of breath, especially when lying down. °· You develop chest or abdominal pain, weakness, or fainting. °· You have a fever. °Document Released: 02/29/2004 Document Revised: 04/15/2011 Document Reviewed: 02/08/2009 °ExitCare® Patient Information ©2015 [employer]. This information is not intended to replace advice given to you by your health care provider. Make sure you discuss any questions you have with your health care provider. ° °

## 2014-04-15 ENCOUNTER — Emergency Department (HOSPITAL_COMMUNITY)
Admission: EM | Admit: 2014-04-15 | Discharge: 2014-04-15 | Disposition: A | Discharge status: Home / Self Care | Payer: Self-pay | Attending: Emergency Medicine | Admitting: Emergency Medicine

## 2014-04-15 ENCOUNTER — Encounter (HOSPITAL_COMMUNITY): Payer: Self-pay | Admitting: Emergency Medicine

## 2014-04-15 DIAGNOSIS — Z794 Long term (current) use of insulin: Secondary | ICD-10-CM | POA: Insufficient documentation

## 2014-04-15 DIAGNOSIS — Z792 Long term (current) use of antibiotics: Secondary | ICD-10-CM | POA: Insufficient documentation

## 2014-04-15 DIAGNOSIS — E119 Type 2 diabetes mellitus without complications: Secondary | ICD-10-CM | POA: Insufficient documentation

## 2014-04-15 DIAGNOSIS — L02411 Cutaneous abscess of right axilla: Secondary | ICD-10-CM | POA: Insufficient documentation

## 2014-04-15 DIAGNOSIS — F329 Major depressive disorder, single episode, unspecified: Secondary | ICD-10-CM | POA: Insufficient documentation

## 2014-04-15 DIAGNOSIS — L02419 Cutaneous abscess of limb, unspecified: Secondary | ICD-10-CM

## 2014-04-15 DIAGNOSIS — Z79899 Other long term (current) drug therapy: Secondary | ICD-10-CM | POA: Insufficient documentation

## 2014-04-15 MED ORDER — LIDOCAINE HCL (PF) 1 % IJ SOLN
10.0000 mL | Freq: Once | INTRAMUSCULAR | Status: AC
  Administered 2014-04-15: 10 mL
  Filled 2014-04-15: qty 10

## 2014-04-15 MED ORDER — SODIUM BICARBONATE 4 % IV SOLN
5.0000 mL | Freq: Once | INTRAVENOUS | Status: AC
  Administered 2014-04-15: 5 mL via SUBCUTANEOUS
  Filled 2014-04-15: qty 5

## 2014-04-15 MED ORDER — CLINDAMYCIN HCL 150 MG PO CAPS: 300.0000 mg | ORAL_CAPSULE | Freq: Three times a day (TID) | ORAL | Status: DC

## 2014-04-15 MED ORDER — OXYCODONE-ACETAMINOPHEN 5-325 MG PO TABS: 1.0000 | ORAL_TABLET | ORAL | Status: DC | PRN

## 2014-04-15 MED ORDER — ONDANSETRON HCL 4 MG PO TABS: 4.0000 mg | ORAL_TABLET | Freq: Four times a day (QID) | ORAL | Status: DC

## 2014-04-15 NOTE — ED Notes (Signed)
Pt states that she had some irritation under right arm about week ago and thought it was from her deodarant. Pt states that 2 days ago she noticed bump that is under her right arm that is very painful but not draining anything.  Pt states that she has had theses before and was told to use DIAL soap but [facility] and found this relaxing bath soap she had to try it.

## 2014-04-15 NOTE — ED Provider Notes (Signed)
CSN: [id number]     Arrival date & time 04/15/14  1725 History  This chart was scribed for Arthor CaptainAbigail La Shehan, PA-C working with Jerelyn ScottMartha Linker, MD by Evon Slackerrance Branch, ED Scribe. This patient was seen in room WTR6/WTR6 and the patient's care was started at 7:16 PM.    Chief Complaint  Patient presents with  . Recurrent Skin Infections   HPI HPI Comments: [name] is a 44 y.o. female who presents to the Emergency Department complaining of worsening right axilla abscess onset 1 week ago. Pt states that she has associated chills. Pt states that she changed the soap she was using when the abscess began to form. Pt states she has tried A&D ointment with no relief. Pt states she has a Hx of similar abscess that was relieved by I&D, antibiotics and using dial soap only. Pt denies any drainage or fevers. Pt states she has a Hx of DM and that her blood sugar is well controlled.    Past Medical History  Diagnosis Date  . Diabetes mellitus   . Morbid obesity   . Depression   . Allergy    Past Surgical History  Procedure Laterality Date  . Tubal ligation     Family History  Problem Relation Age of Onset  . Diabetes Mother   . Lymphoma Father   . Lymphoma Brother    History  Substance Use Topics  . Smoking status: Never Smoker   . Smokeless tobacco: Never Used  . Alcohol Use: No   OB History    No data available     Review of Systems  Constitutional: Positive for chills. Negative for fever.  Skin: Positive for rash (abscess).  All other systems reviewed and are negative.    Allergies  Codeine  Home Medications   Prior to Admission medications   Medication Sig Start Date End Date Taking? Authorizing Provider  ibuprofen (ADVIL,MOTRIN) 200 MG tablet Take 800 mg by mouth every 6 (six) hours as needed for moderate pain (pain).    Yes Historical Provider, MD  insulin glargine (LANTUS) 100 UNIT/ML injection Inject 20 Units into the skin every evening. 06/02/13  Yes Mahima  Pandey, MD  insulin regular (NOVOLIN R) 100 units/mL injection Inject 0.05 mLs (5 Units total) into the skin 3 (three) times daily before meals. As needed for glucose over 250 09/19/13  Yes Kristen N Ward, DO  metFORMIN (GLUCOPHAGE) 500 MG tablet TAKE ONE TABLET BY MOUTH TWICE DAILY WITH MEALS 01/31/14  Yes Tiffany L Reed, DO  clindamycin (CLEOCIN) 150 MG capsule Take 2 capsules (300 mg total) by mouth 3 (three) times daily. May dispense as 150mg  capsules Patient not taking: Reported on 03/22/2014 10/26/13   Junious SilkHannah Merrell, PA-C  traMADol (ULTRAM) 50 MG tablet Take 1 tablet (50 mg total) by mouth every 6 (six) hours as needed. Patient not taking: Reported on 03/22/2014 10/26/13   Junious SilkHannah Merrell, PA-C   BP 126/50 mmHg  Pulse 88  Temp(Src) 99.3 F (37.4 C) (Oral)  Resp 19  SpO2 99%  LMP 03/05/2014 (Approximate)   Physical Exam  Constitutional: She is oriented to person, place, and time. She appears well-developed and well-nourished. No distress.  HENT:  Head: Normocephalic and atraumatic.  Eyes: Conjunctivae and EOM are normal.  Neck: Neck supple. No tracheal deviation present.  Cardiovascular: Normal rate.   Pulmonary/Chest: Effort normal. No respiratory distress.  Musculoskeletal: Normal range of motion.  Neurological: She is alert and oriented to person, place, and time.  Skin: Skin  is warm and dry.  6 cm indurated abscess with central fluctuance.   Psychiatric: She has a normal mood and affect. Her behavior is normal.  Nursing note and vitals reviewed.   ED Course  Procedures (including critical care time) DIAGNOSTIC STUDIES: Oxygen Saturation is 99% on RA, normal by my interpretation.    COORDINATION OF CARE: 7:25 PM-Discussed treatment plan with pt at bedside and pt agreed to plan.   INCISION AND DRAINAGE Performed by: Arthor Captain Consent: Verbal consent obtained. Risks and benefits: risks, benefits and alternatives were discussed Type: abscess  Body area: Right  Axilla  Anesthesia: local infiltration  Incision was made with a scalpel.  Local anesthetic: lidocaine 1% w/o epinephrine in 2:1 mixture with 4% bicarb  Anesthetic total: 7 ml  Complexity: complex Blunt dissection to break up loculations  Drainage: purulent  Drainage amount: copious  Packing material: NONE  Patient tolerance: Patient tolerated the procedure well with no immediate complications.  &  Labs Review Labs Reviewed - No data to display  Imaging Review No results found.   EKG Interpretation None      MDM   Final diagnoses:  Axillary abscess    Patient with skin abscess amenable to incision and drainage.  Abscess was not large enough to warrant packing, wound recheck in 2 days if there are signs of worsening infection. Encouraged mild cleansing and bandage change twice daily.  Mild signs of cellulitis is surrounding skin.  Will d/c to home.  Antibiotic therapy is indicated for DM, mild cellulitis.     I personally performed the services described in this documentation, which was scribed in my presence. The recorded information has been reviewed and is accurate.        Arthor Captain, PA-C 04/16/14 1610  Jerelyn Scott, MD 04/16/14 [phone]

## 2014-04-15 NOTE — Discharge Instructions (Signed)

## 2014-05-30 ENCOUNTER — Encounter: Payer: Self-pay | Admitting: Internal Medicine

## 2014-06-21 ENCOUNTER — Emergency Department (HOSPITAL_COMMUNITY)
Admission: EM | Admit: 2014-06-21 | Discharge: 2014-06-21 | Disposition: A | Discharge status: Home / Self Care | Payer: Self-pay | Attending: Emergency Medicine | Admitting: Emergency Medicine

## 2014-06-21 ENCOUNTER — Encounter (HOSPITAL_COMMUNITY): Payer: Self-pay

## 2014-06-21 ENCOUNTER — Emergency Department (HOSPITAL_COMMUNITY): Payer: Self-pay

## 2014-06-21 DIAGNOSIS — Z792 Long term (current) use of antibiotics: Secondary | ICD-10-CM | POA: Insufficient documentation

## 2014-06-21 DIAGNOSIS — N12 Tubulo-interstitial nephritis, not specified as acute or chronic: Secondary | ICD-10-CM | POA: Insufficient documentation

## 2014-06-21 DIAGNOSIS — Z79899 Other long term (current) drug therapy: Secondary | ICD-10-CM | POA: Insufficient documentation

## 2014-06-21 DIAGNOSIS — E119 Type 2 diabetes mellitus without complications: Secondary | ICD-10-CM | POA: Insufficient documentation

## 2014-06-21 DIAGNOSIS — R109 Unspecified abdominal pain: Secondary | ICD-10-CM

## 2014-06-21 DIAGNOSIS — Z794 Long term (current) use of insulin: Secondary | ICD-10-CM | POA: Insufficient documentation

## 2014-06-21 DIAGNOSIS — F329 Major depressive disorder, single episode, unspecified: Secondary | ICD-10-CM | POA: Insufficient documentation

## 2014-06-21 DIAGNOSIS — E663 Overweight: Secondary | ICD-10-CM | POA: Insufficient documentation

## 2014-06-21 LAB — CBC WITH DIFFERENTIAL/PLATELET
BASOS ABS: 0 10*3/uL (ref 0.0–0.1)
BASOS PCT: 0 % (ref 0–1)
EOS ABS: 0.1 10*3/uL (ref 0.0–0.7)
Eosinophils Relative: 2 % (ref 0–5)
HCT: 34.3 % — ABNORMAL LOW (ref 36.0–46.0)
Hemoglobin: 11.7 g/dL — ABNORMAL LOW (ref 12.0–15.0)
Lymphocytes Relative: 39 % (ref 12–46)
Lymphs Abs: 1.7 10*3/uL (ref 0.7–4.0)
MCH: 28.5 pg (ref 26.0–34.0)
MCHC: 34.1 g/dL (ref 30.0–36.0)
MCV: 83.5 fL (ref 78.0–100.0)
MONO ABS: 0.4 10*3/uL (ref 0.1–1.0)
MONOS PCT: 10 % (ref 3–12)
Neutro Abs: 2.2 10*3/uL (ref 1.7–7.7)
Neutrophils Relative %: 49 % (ref 43–77)
Platelets: 229 10*3/uL (ref 150–400)
RBC: 4.11 MIL/uL (ref 3.87–5.11)
RDW: 13.1 % (ref 11.5–15.5)
WBC: 4.5 10*3/uL (ref 4.0–10.5)

## 2014-06-21 LAB — URINALYSIS, ROUTINE W REFLEX MICROSCOPIC
BILIRUBIN URINE: NEGATIVE
Glucose, UA: NEGATIVE mg/dL
KETONES UR: NEGATIVE mg/dL
NITRITE: NEGATIVE
PH: 7 (ref 5.0–8.0)
Protein, ur: NEGATIVE mg/dL
SPECIFIC GRAVITY, URINE: 1.009 (ref 1.005–1.030)
Urobilinogen, UA: 0.2 mg/dL (ref 0.0–1.0)

## 2014-06-21 LAB — COMPREHENSIVE METABOLIC PANEL
ALBUMIN: 3.3 g/dL — AB (ref 3.5–5.0)
ALK PHOS: 135 U/L — AB (ref 38–126)
ALT: 19 U/L (ref 14–54)
ANION GAP: 9 (ref 5–15)
AST: 22 U/L (ref 15–41)
BILIRUBIN TOTAL: 0.6 mg/dL (ref 0.3–1.2)
BUN: 10 mg/dL (ref 6–20)
CO2: 27 mmol/L (ref 22–32)
Calcium: 9.1 mg/dL (ref 8.9–10.3)
Chloride: 101 mmol/L (ref 101–111)
Creatinine, Ser: 0.47 mg/dL (ref 0.44–1.00)
GFR calc Af Amer: >60 mL/min (ref >60)
GFR calc non Af Amer: >60 mL/min (ref >60)
Glucose, Bld: 108 mg/dL — ABNORMAL HIGH (ref 65–99)
Potassium: 3.3 mmol/L — ABNORMAL LOW (ref 3.5–5.1)
SODIUM: 137 mmol/L (ref 135–145)
Total Protein: 8 g/dL (ref 6.5–8.1)

## 2014-06-21 LAB — LIPASE, BLOOD: LIPASE: 17 U/L — AB (ref 22–51)

## 2014-06-21 LAB — URINE MICROSCOPIC-ADD ON

## 2014-06-21 MED ORDER — CIPROFLOXACIN HCL 500 MG PO TABS: 500.0000 mg | ORAL_TABLET | Freq: Two times a day (BID) | ORAL | Status: DC

## 2014-06-21 MED ORDER — TRAMADOL HCL 50 MG PO TABS
50.0000 mg | ORAL_TABLET | Freq: Once | ORAL | Status: AC
  Administered 2014-06-21: 50 mg via ORAL
  Filled 2014-06-21: qty 1

## 2014-06-21 MED ORDER — TRAMADOL HCL 50 MG PO TABS: 50.0000 mg | ORAL_TABLET | Freq: Four times a day (QID) | ORAL | Status: DC | PRN

## 2014-06-21 MED ORDER — DEXTROSE 5 % IV SOLN
1.0000 g | Freq: Once | INTRAVENOUS | Status: AC
  Administered 2014-06-21: 1 g via INTRAVENOUS
  Filled 2014-06-21: qty 10

## 2014-06-21 NOTE — ED Provider Notes (Signed)
CSN: [id number]     Arrival date & time 06/21/14  1758 History   First MD Initiated Contact with Patient 06/21/14 1858     Chief Complaint  Patient presents with  . Back Pain  . Flank Pain     (Consider location/radiation/quality/duration/timing/severity/associated sxs/prior Treatment) The history is provided by the patient.  [name] is a 44 y.o. female hx of DM, obesity, gallstones here with L flank pain, back pain, ab pain. 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 Left-sided flank pain radiating to her abdomen for the last 4 days. It is sharp and intermittent and worse with movement. She has been taking Motrin and has not helped. Denies any vomiting or diarrhea. Has history of gallstones but no history kidney stones. Denies any dysuria or hematuria. Denies any fevers.    Past Medical History  Diagnosis Date  . Diabetes mellitus   . Morbid obesity   . Depression   . Allergy    Past Surgical History  Procedure Laterality Date  . Tubal ligation     Family History  Problem Relation Age of Onset  . Diabetes Mother   . Lymphoma Father   . Lymphoma Brother    History  Substance Use Topics  . Smoking status: Never Smoker   . Smokeless tobacco: Never Used  . Alcohol Use: No   OB History    No data available     Review of Systems  Gastrointestinal: Positive for abdominal pain.  Musculoskeletal: Positive for back pain.  All other systems reviewed and are negative.     Allergies  Codeine  Home Medications   Prior to Admission medications   Medication Sig Start Date End Date Taking? Authorizing Provider  CRANBERRY PO Take 1 tablet by mouth daily as needed (urinary tract issues.).   Yes Historical Provider, MD  Fexofenadine HCl (ALLEGRA PO) Take 1 tablet by mouth daily as needed (allergies.).   Yes Historical Provider, MD  ibuprofen (ADVIL,MOTRIN) 200 MG tablet Take 800 mg by mouth every 6 (six) hours as needed for moderate pain (pain).    Yes  Historical Provider, MD  insulin glargine (LANTUS) 100 UNIT/ML injection Inject 20 Units into the skin every evening. 06/02/13  Yes Mahima Pandey, MD  insulin regular (NOVOLIN R) 100 units/mL injection Inject 0.05 mLs (5 Units total) into the skin 3 (three) times daily before meals. As needed for glucose over 250 09/19/13  Yes Kristen N Ward, DO  metFORMIN (GLUCOPHAGE) 500 MG tablet TAKE ONE TABLET BY MOUTH TWICE DAILY WITH MEALS 01/31/14  Yes Tiffany L Reed, DO  Pseudoephedrine HCl (SUDAFED PO) Take 1 tablet by mouth every 4 (four) hours as needed (congestion.).   Yes Historical Provider, MD  clindamycin (CLEOCIN) 150 MG capsule Take 2 capsules (300 mg total) by mouth 3 (three) times daily. May dispense as 150mg  capsules Patient not taking: Reported on 06/21/2014 04/15/14   Arthor CaptainAbigail Harris, PA-C  ondansetron (ZOFRAN) 4 MG tablet Take 1 tablet (4 mg total) by mouth every 6 (six) hours. Patient not taking: Reported on 06/21/2014 04/15/14   Arthor CaptainAbigail Harris, PA-C  oxyCODONE-acetaminophen (PERCOCET) 5-325 MG per tablet Take 1-2 tablets by mouth every 4 (four) hours as needed. Patient not taking: Reported on 06/21/2014 04/15/14   Arthor CaptainAbigail Harris, PA-C  traMADol (ULTRAM) 50 MG tablet Take 1 tablet (50 mg total) by mouth every 6 (six) hours as needed. Patient not taking: Reported on 03/22/2014 10/26/13   Junious SilkHannah Merrell, PA-C   BP 138/53  mmHg  Pulse 66  Temp(Src) 98.6 F (37 C) (Oral)  Resp 17  SpO2 100%  LMP 05/22/2014 Physical Exam  Constitutional: She is oriented to person, place, and time.  Overweight, NAD   HENT:  Head: Normocephalic.  Mouth/Throat: Oropharynx is clear and moist.  Eyes: Conjunctivae are normal. Pupils are equal, round, and reactive to light.  Neck: Normal range of motion. Neck supple.  Cardiovascular: Normal rate, regular rhythm and normal heart sounds.   Pulmonary/Chest: Effort normal and breath sounds normal. No respiratory distress. She has no wheezes. She has no rales.  Abdominal:  Soft.  Mild epigastric tenderness. Mild L CVAT vs muscle tenderness.   Musculoskeletal: Normal range of motion. She exhibits no edema or tenderness.  Neurological: She is alert and oriented to person, place, and time. No cranial nerve deficit. Coordination normal.  Skin: Skin is warm and dry.  Psychiatric: She has a normal mood and affect. Her behavior is normal. Judgment and thought content normal.  Nursing note and vitals reviewed.   ED Course  Procedures (including critical care time) Labs Review Labs Reviewed  URINALYSIS, ROUTINE W REFLEX MICROSCOPIC - Abnormal; Notable for the following:    APPearance CLOUDY (*)    Hgb urine dipstick LARGE (*)    Leukocytes, UA LARGE (*)    All other components within normal limits  CBC WITH DIFFERENTIAL/PLATELET - Abnormal; Notable for the following:    Hemoglobin 11.7 (*)    HCT 34.3 (*)    All other components within normal limits  COMPREHENSIVE METABOLIC PANEL - Abnormal; Notable for the following:    Potassium 3.3 (*)    Glucose, Bld 108 (*)    Albumin 3.3 (*)    Alkaline Phosphatase 135 (*)    All other components within normal limits  LIPASE, BLOOD - Abnormal; Notable for the following:    Lipase 17 (*)    All other components within normal limits  URINE MICROSCOPIC-ADD ON  LIPASE, BLOOD    Imaging Review Ct Abdomen Pelvis Wo Contrast  06/21/2014   CLINICAL DATA:  Low back pain and left flank pain.  EXAM: CT ABDOMEN AND PELVIS WITHOUT CONTRAST  TECHNIQUE: Multidetector CT imaging of the abdomen and pelvis was performed following the standard protocol without IV contrast.  COMPARISON:  CT scan dated 11/09/2009  FINDINGS: There are no renal or ureteral or bladder calculi. There is no hydronephrosis. There are multiple small stones in the nondistended gallbladder, unchanged.  Liver appears normal. No dilated bile ducts. Spleen, pancreas, and adrenal glands are normal. There is a single diverticulum in the proximal transverse colon.  Terminal ileum and appendix are normal. No dilated bowel. No adenopathy.  No acute osseous abnormality. Chronic sclerosis of the superior endplate of L1.  The patient has a single large uterine fibroid, increased from 7.9 x 7.5 cm to 8.0 x 8.7 cm. Tiny calcifications in the fibroid. Ovaries are normal.  IMPRESSION: No acute abnormalities. Specifically, no renal or ureteral or bladder calculi. Enlarging uterine fibroid.   Electronically Signed   By: Francene Boyers M.D.   On: 06/21/2014 21:00     EKG Interpretation None      MDM   Final diagnoses:  Left flank pain    Hildagard C Jacuinde is a 44 y.o. female here with L flank pain, back pain. Consider pyelo vs stone vs muscle strain. Will get labs, UA, CT stone study.   9:29 PM Labs unremarkable. CT nl. UA + leuks. Likely pyelo. Given ceftriaxone. Well  appearing. Will dc home with cipro, tramadol.    Richardean Canalavid H Alegandro Macnaughton, MD 06/21/14 2129

## 2014-06-21 NOTE — ED Notes (Signed)
Failed IV attempt x2. Second nurse requested to bedside for IV start.

## 2014-06-21 NOTE — ED Notes (Signed)
Pt with left lower back to flank pain.  Pt states symptoms since Friday.  No change in urination.  No n/v/d.  No change in bowels.  No injury noted.  No hx of kidney stones.

## 2014-06-21 NOTE — ED Notes (Signed)
1x unsuccessful lab draw left hand

## 2014-06-21 NOTE — ED Notes (Signed)
Patient transported to CT 

## 2014-06-21 NOTE — Discharge Instructions (Signed)
Take cipro twice daily for a week.   Take motrin for pain.   Take tramadol for severe pain.   Follow up with your doctor.  Return to ER if you have severe pain, vomiting, fevers.

## 2014-09-02 ENCOUNTER — Encounter (HOSPITAL_COMMUNITY): Payer: Self-pay | Admitting: *Deleted

## 2014-09-02 ENCOUNTER — Emergency Department (HOSPITAL_COMMUNITY)
Admission: EM | Admit: 2014-09-02 | Discharge: 2014-09-02 | Disposition: A | Discharge status: Home / Self Care | Payer: Self-pay | Attending: Emergency Medicine | Admitting: Emergency Medicine

## 2014-09-02 DIAGNOSIS — E119 Type 2 diabetes mellitus without complications: Secondary | ICD-10-CM | POA: Insufficient documentation

## 2014-09-02 DIAGNOSIS — R109 Unspecified abdominal pain: Secondary | ICD-10-CM | POA: Insufficient documentation

## 2014-09-02 DIAGNOSIS — Z8659 Personal history of other mental and behavioral disorders: Secondary | ICD-10-CM | POA: Insufficient documentation

## 2014-09-02 DIAGNOSIS — Z794 Long term (current) use of insulin: Secondary | ICD-10-CM | POA: Insufficient documentation

## 2014-09-02 DIAGNOSIS — Z3202 Encounter for pregnancy test, result negative: Secondary | ICD-10-CM | POA: Insufficient documentation

## 2014-09-02 DIAGNOSIS — R350 Frequency of micturition: Secondary | ICD-10-CM | POA: Insufficient documentation

## 2014-09-02 LAB — CBC
HCT: 33.9 % — ABNORMAL LOW (ref 36.0–46.0)
Hemoglobin: 12.2 g/dL (ref 12.0–15.0)
MCH: 29 pg (ref 26.0–34.0)
MCHC: 36 g/dL (ref 30.0–36.0)
MCV: 80.5 fL (ref 78.0–100.0)
PLATELETS: 217 10*3/uL (ref 150–400)
RBC: 4.21 MIL/uL (ref 3.87–5.11)
RDW: 12.5 % (ref 11.5–15.5)
WBC: 4.8 10*3/uL (ref 4.0–10.5)

## 2014-09-02 LAB — COMPREHENSIVE METABOLIC PANEL
ALK PHOS: 160 U/L — AB (ref 38–126)
ALT: 28 U/L (ref 14–54)
ANION GAP: 8 (ref 5–15)
AST: 27 U/L (ref 15–41)
Albumin: 3.1 g/dL — ABNORMAL LOW (ref 3.5–5.0)
BUN: <5 mg/dL — ABNORMAL LOW (ref 6–20)
CO2: 28 mmol/L (ref 22–32)
Calcium: 9 mg/dL (ref 8.9–10.3)
Chloride: 101 mmol/L (ref 101–111)
Creatinine, Ser: 0.45 mg/dL (ref 0.44–1.00)
GFR calc Af Amer: >60 mL/min (ref >60)
GFR calc non Af Amer: >60 mL/min (ref >60)
Glucose, Bld: 157 mg/dL — ABNORMAL HIGH (ref 65–99)
Potassium: 3.1 mmol/L — ABNORMAL LOW (ref 3.5–5.1)
Sodium: 137 mmol/L (ref 135–145)
TOTAL PROTEIN: 8 g/dL (ref 6.5–8.1)
Total Bilirubin: 0.8 mg/dL (ref 0.3–1.2)

## 2014-09-02 LAB — URINE MICROSCOPIC-ADD ON

## 2014-09-02 LAB — URINALYSIS, ROUTINE W REFLEX MICROSCOPIC
Bilirubin Urine: NEGATIVE
GLUCOSE, UA: NEGATIVE mg/dL
KETONES UR: NEGATIVE mg/dL
Nitrite: NEGATIVE
PROTEIN: NEGATIVE mg/dL
Specific Gravity, Urine: 1.005 (ref 1.005–1.030)
Urobilinogen, UA: 0.2 mg/dL (ref 0.0–1.0)
pH: 7 (ref 5.0–8.0)

## 2014-09-02 LAB — LIPASE, BLOOD: Lipase: 18 U/L — ABNORMAL LOW (ref 22–51)

## 2014-09-02 LAB — POC URINE PREG, ED: PREG TEST UR: NEGATIVE

## 2014-09-02 MED ORDER — CYCLOBENZAPRINE HCL 5 MG PO TABS: 5.0000 mg | ORAL_TABLET | Freq: Three times a day (TID) | ORAL | Status: DC | PRN

## 2014-09-02 MED ORDER — NAPROXEN 375 MG PO TABS: 375.0000 mg | ORAL_TABLET | Freq: Two times a day (BID) | ORAL | Status: DC

## 2014-09-02 MED ORDER — POTASSIUM CHLORIDE CRYS ER 20 MEQ PO TBCR
30.0000 meq | EXTENDED_RELEASE_TABLET | Freq: Once | ORAL | Status: AC
  Administered 2014-09-02: 30 meq via ORAL
  Filled 2014-09-02: qty 2

## 2014-09-02 NOTE — Discharge Instructions (Signed)
Flank Pain °Flank pain refers to pain that is located on the side of the body between the upper abdomen and the back. The pain may occur over a short period of time (acute) or may be long-term or reoccurring (chronic). It may be mild or severe. Flank pain can be caused by many things. °CAUSES  °Some of the more common causes of flank pain include: °· Muscle strains.   °· Muscle spasms.   °· A disease of your spine (vertebral disk disease).   °· A lung infection (pneumonia).   °· Fluid around your lungs (pulmonary edema).   °· A kidney infection.   °· Kidney stones.   °· A very painful skin rash caused by the chickenpox virus (shingles).   °· Gallbladder disease.   °HOME CARE INSTRUCTIONS  °Home care will depend on the cause of your pain. In general, °· Rest as directed by your caregiver. °· Drink enough fluids to keep your urine clear or pale yellow. °· Only take over-the-counter or prescription medicines as directed by your caregiver. Some medicines may help relieve the pain. °· Tell your caregiver about any changes in your pain. °· Follow up with your caregiver as directed. °SEEK IMMEDIATE MEDICAL CARE IF:  °· Your pain is not controlled with medicine.   °· You have new or worsening symptoms. °· Your pain increases.   °· You have abdominal pain.   °· You have shortness of breath.   °· You have persistent nausea or vomiting.   °· You have swelling in your abdomen.   °· You feel faint or pass out.   °· You have blood in your urine. °· You have a fever or persistent symptoms for more than 2-3 days. °· You have a fever and your symptoms suddenly get worse. °MAKE SURE YOU:  °· Understand these instructions. °· Will watch your condition. °· Will get help right away if you are not doing well or get worse. °Document Released: 03/14/2005 Document Revised: 10/16/2011 Document Reviewed: 09/05/2011 °ExitCare® Patient Information ©2015 [employer]. This information is not intended to replace advice given to you by your  health care provider. Make sure you discuss any questions you have with your health care provider. °Muscle Strain °A muscle strain is an injury that occurs when a muscle is stretched beyond its normal length. Usually a small number of muscle fibers are torn when this happens. Muscle strain is rated in degrees. First-degree strains have the least amount of muscle fiber tearing and pain. Second-degree and third-degree strains have increasingly more tearing and pain.  °Usually, recovery from muscle strain takes 1-2 weeks. Complete healing takes 5-6 weeks.  °CAUSES  °Muscle strain happens when a sudden, violent force placed on a muscle stretches it too far. This may occur with lifting, sports, or a fall.  °RISK FACTORS °Muscle strain is especially common in athletes.  °SIGNS AND SYMPTOMS °At the site of the muscle strain, there may be: °· Pain. °· Bruising. °· Swelling. °· Difficulty using the muscle due to pain or lack of normal function. °DIAGNOSIS  °Your health care provider will perform a physical exam and ask about your medical history. °TREATMENT  °Often, the best treatment for a muscle strain is resting, icing, and applying cold compresses to the injured area.   °HOME CARE INSTRUCTIONS  °· Use the PRICE method of treatment to promote muscle healing during the first 2-3 days after your injury. The PRICE method involves: °¨ Protecting the muscle from being injured again. °¨ Restricting your activity and resting the injured body part. °¨ Icing your injury. To do this,   put ice in a plastic bag. Place a towel between your skin and the bag. Then, apply the ice and leave it on from 15-20 minutes each hour. After the third day, switch to moist heat packs. °¨ Apply compression to the injured area with a splint or elastic bandage. Be careful not to wrap it too tightly. This may interfere with blood circulation or increase swelling. °¨ Elevate the injured body part above the level of your heart as often as you can. °· Only  take over-the-counter or prescription medicines for pain, discomfort, or fever as directed by your health care provider. °· Warming up prior to exercise helps to prevent future muscle strains. °SEEK MEDICAL CARE IF:  °· You have increasing pain or swelling in the injured area. °· You have numbness, tingling, or a significant loss of strength in the injured area. °MAKE SURE YOU:  °· Understand these instructions. °· Will watch your condition. °· Will get help right away if you are not doing well or get worse. °Document Released: 01/21/2005 Document Revised: 11/11/2012 Document Reviewed: 08/20/2012 °ExitCare® Patient Information ©2015 [employer]. This information is not intended to replace advice given to you by your health care provider. Make sure you discuss any questions you have with your health care provider. ° °

## 2014-09-02 NOTE — ED Provider Notes (Signed)
CSN: [id number]     Arrival date & time 09/02/14  1657 History   First MD Initiated Contact with Patient 09/02/14 1754     Chief Complaint  Patient presents with  . Flank Pain     Patient is a 44 y.o. female presenting with flank pain. The history is provided by the patient. No language interpreter was used.  Flank Pain   Ms. Raczka presents for evaluation of left flank pain. She states she's had 5 days of pain in her left side. The pain is worse with movement and is described as a sharp pain. She does have urinary frequency but she is not sure if this is because of her diabetes or she has a urinary tract infection. She's had similar symptoms with a urinary tract infection. She denies any fevers, nausea, vomiting, chest pain, shortness of breath, numbness, weakness. Symptoms are moderate and waxing and waning. She has no current pain on ED evaluation.  Past Medical History  Diagnosis Date  . Diabetes mellitus   . Morbid obesity   . Depression   . Allergy    Past Surgical History  Procedure Laterality Date  . Tubal ligation     Family History  Problem Relation Age of Onset  . Diabetes Mother   . Lymphoma Father   . Lymphoma Brother    History  Substance Use Topics  . Smoking status: Never Smoker   . Smokeless tobacco: Never Used  . Alcohol Use: No   OB History    No data available     Review of Systems  Genitourinary: Positive for flank pain.  All other systems reviewed and are negative.     Allergies  Codeine  Home Medications   Prior to Admission medications   Medication Sig Start Date End Date Taking? Authorizing Provider  Fexofenadine HCl (ALLEGRA PO) Take 1 tablet by mouth daily as needed (allergies.).   Yes Historical Provider, MD  insulin glargine (LANTUS) 100 UNIT/ML injection Inject 20 Units into the skin every evening. 06/02/13  Yes Mahima Pandey, MD  insulin regular (NOVOLIN R) 100 units/mL injection Inject 0.05 mLs (5 Units total) into the skin 3  (three) times daily before meals. As needed for glucose over 250 09/19/13  Yes Kristen N Ward, DO  ciprofloxacin (CIPRO) 500 MG tablet Take 1 tablet (500 mg total) by mouth 2 (two) times daily. One po bid x 7 days Patient not taking: Reported on 09/02/2014 06/21/14   Richardean Canal, MD  clindamycin (CLEOCIN) 150 MG capsule Take 2 capsules (300 mg total) by mouth 3 (three) times daily. May dispense as  capsules Patient not taking: Reported on 06/21/2014 04/15/14   Arthor Captain, PA-C  metFORMIN (GLUCOPHAGE) 500 MG tablet TAKE ONE TABLET BY MOUTH TWICE DAILY WITH MEALS Patient not taking: Reported on 09/02/2014 01/31/14   Tiffany L Reed, DO  ondansetron (ZOFRAN) 4 MG tablet Take 1 tablet (4 mg total) by mouth every 6 (six) hours. Patient not taking: Reported on 06/21/2014 04/15/14   Arthor Captain, PA-C  oxyCODONE-acetaminophen (PERCOCET) 5-325 MG per tablet Take 1-2 tablets by mouth every 4 (four) hours as needed. Patient not taking: Reported on 06/21/2014 04/15/14   Arthor Captain, PA-C  traMADol (ULTRAM) 50 MG tablet Take 1 tablet (50 mg total) by mouth every 6 (six) hours as needed. Patient not taking: Reported on 09/02/2014 06/21/14   Richardean Canal, MD   BP 150/69 mmHg  Pulse 71  Temp(Src) 98 F (36.7 C)  Resp 18  Ht 5\' 8"  (1.727 m)  Wt 336 lb 8 oz (152.635 kg)  BMI 51.18 kg/m2  SpO2 100%  LMP 08/08/2014 Physical Exam  Constitutional: She is oriented to person, place, and time. She appears well-developed and well-nourished.  HENT:  Head: Normocephalic and atraumatic.  Cardiovascular: Normal rate and regular rhythm.   No murmur heard. Pulmonary/Chest: Effort normal and breath sounds normal. No respiratory distress.  Abdominal: Soft. There is no tenderness. There is no rebound and no guarding.  No CVA tenderness  Musculoskeletal: She exhibits no edema or tenderness.  Neurological: She is alert and oriented to person, place, and time.  5 out of 5 strength in bilateral lower extremities,  sensation of light touch intact in bilateral lower extremities.  Skin: Skin is warm and dry.  Psychiatric: She has a normal mood and affect. Her behavior is normal.  Nursing note and vitals reviewed.   ED Course  Procedures (including critical care time) Labs Review Labs Reviewed  LIPASE, BLOOD - Abnormal; Notable for the following:    Lipase 18 (*)    All other components within normal limits  COMPREHENSIVE METABOLIC PANEL - Abnormal; Notable for the following:    Potassium 3.1 (*)    Glucose, Bld 157 (*)    BUN <5 (*)    Albumin 3.1 (*)    Alkaline Phosphatase 160 (*)    All other components within normal limits  CBC - Abnormal; Notable for the following:    HCT 33.9 (*)    All other components within normal limits  URINALYSIS, ROUTINE W REFLEX MICROSCOPIC (NOT AT [facility]) - Abnormal; Notable for the following:    Hgb urine dipstick TRACE (*)    Leukocytes, UA TRACE (*)    All other components within normal limits  URINE CULTURE  URINE MICROSCOPIC-ADD ON  POC URINE PREG, ED    Imaging Review No results found.   EKG Interpretation None      MDM   Final diagnoses:  Acute flank pain   Patient here for evaluation of flank pain, reproducible with bending and standing up. Patient is neurologically intact. History and presentation is not consistent with pyelonephritis, renal colic, dissection. UA is not consistent with UTI, somewhat blood cells present, we will send culture. Discuss with patient home care for musculoskeletal flank pain as well as close return precautions for development of new symptoms.    Tilden Fossa, MD 09/02/14 Corky Crafts

## 2014-09-02 NOTE — ED Notes (Signed)
Dr. Rees at the bedside.  

## 2014-09-02 NOTE — ED Notes (Signed)
MD at the bedside  

## 2014-09-02 NOTE — ED Notes (Signed)
Lt flank pain since Monday urinary frequency  lmp July 1st

## 2014-09-04 LAB — URINE CULTURE

## 2015-02-10 ENCOUNTER — Encounter (HOSPITAL_COMMUNITY): Payer: Self-pay | Admitting: Emergency Medicine

## 2015-02-10 ENCOUNTER — Emergency Department (INDEPENDENT_AMBULATORY_CARE_PROVIDER_SITE_OTHER)
Admission: EM | Admit: 2015-02-10 | Discharge: 2015-02-10 | Disposition: A | Discharge status: Home / Self Care | Payer: Self-pay | Source: Home / Self Care | Attending: Family Medicine | Admitting: Family Medicine

## 2015-02-10 DIAGNOSIS — M778 Other enthesopathies, not elsewhere classified: Secondary | ICD-10-CM

## 2015-02-10 DIAGNOSIS — M779 Enthesopathy, unspecified: Secondary | ICD-10-CM

## 2015-02-10 DIAGNOSIS — L03011 Cellulitis of right finger: Secondary | ICD-10-CM

## 2015-02-10 MED ORDER — TRAMADOL HCL 50 MG PO TABS: 50.0000 mg | ORAL_TABLET | Freq: Four times a day (QID) | ORAL | Status: DC | PRN

## 2015-02-10 MED ORDER — CEPHALEXIN 500 MG PO CAPS: 500.0000 mg | ORAL_CAPSULE | Freq: Four times a day (QID) | ORAL | Status: DC

## 2015-02-10 MED ORDER — DICLOFENAC POTASSIUM 50 MG PO TABS: 50.0000 mg | ORAL_TABLET | Freq: Three times a day (TID) | ORAL | Status: DC

## 2015-02-10 NOTE — ED Provider Notes (Signed)
CSN: [id number]     Arrival date & time 02/10/15  1400 History   First MD Initiated Contact with Patient 02/10/15 1641     Chief Complaint  Patient presents with  . 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 Specifically her left middle finger has tenderness to the distal phalanx just proximal to the nail. It appears as though it may be an early paronychia without fluctuance or purulence. The right index finger has tenderness and pain to the proximal phalanx particularly with extension. She is also noticed localized mild swelling to the proximal phalanx. She knows of no known injury or causative event. The right third finger has a right neck area. Denies any known injuries. She states she occasionally bites her nails.   Past Medical History  Diagnosis Date  . Diabetes mellitus   . Morbid obesity (HCC)   . Depression   . Allergy    Past Surgical History  Procedure Laterality Date  . Tubal ligation     Family History  Problem Relation Age of Onset  . Diabetes Mother   . Lymphoma Father   . Lymphoma Brother    Social History  Substance Use Topics  . Smoking status: Never Smoker   . Smokeless tobacco: Never Used  . Alcohol Use: No   OB History    No data available     Review of Systems  Constitutional: Negative for fever, activity change and fatigue.  HENT: Negative.   Respiratory: Negative.   Cardiovascular: Negative.   Genitourinary: Negative.   Skin: Positive for wound.  Neurological: Negative.   Psychiatric/Behavioral: Negative.   All other systems reviewed and are negative.   Allergies  Codeine  Home Medications   Prior to Admission medications   Medication Sig Start Date End Date Taking? Authorizing Provider  insulin glargine (LANTUS) 100 UNIT/ML injection Inject 20 Units  into the skin every evening. 06/02/13  Yes Mahima Pandey, MD  insulin regular (NOVOLIN R) 100 units/mL injection Inject 0.05 mLs (5 Units total) into the skin 3 (three) times daily before meals. As needed for glucose over 250 09/19/13  Yes Kristen N Ward, DO  metFORMIN (GLUCOPHAGE) 500 MG tablet TAKE ONE TABLET BY MOUTH TWICE DAILY WITH MEALS 01/31/14  Yes Tiffany L Reed, DO  cephALEXin (KEFLEX) 500 MG capsule Take 1 capsule (500 mg total) by mouth 4 (four) times daily. 02/10/15   Hayden Rasmussenavid Aston Lawhorn, NP  cyclobenzaprine (FLEXERIL) 5 MG tablet Take 1 tablet (5 mg total) by mouth 3 (three) times daily as needed for muscle spasms. 09/02/14   Tilden FossaElizabeth Rees, MD  diclofenac (CATAFLAM) 50 MG tablet Take 1 tablet (50 mg total) by mouth 3 (three) times daily. One tablet TID with food prn pain. 02/10/15   Hayden Rasmussenavid Samuell Knoble, NP  Fexofenadine HCl (ALLEGRA PO) Take 1 tablet by mouth daily as needed (allergies.).    Historical Provider, MD  naproxen (NAPROSYN) 375 MG tablet Take 1 tablet (375 mg total) by mouth 2 (two) times daily. 09/02/14   Tilden FossaElizabeth Rees, MD  traMADol (ULTRAM) 50 MG tablet Take 1 tablet (50 mg total) by mouth every 6 (six) hours as needed. 02/10/15   Hayden Rasmussenavid Mohmmad Saleeby, NP   Meds Ordered and Administered this Visit  Medications - No data to display  BP 137/67 mmHg  Pulse 88  Temp(Src) 98.1 F (36.7 C) (Oral)  Resp 18  SpO2 99%  No data found.   Physical Exam  Constitutional: She appears well-developed and well-nourished. No distress.  Neck: Normal range of motion. Neck supple.  Cardiovascular: Normal rate.   Pulmonary/Chest: Effort normal. No respiratory distress.  Musculoskeletal:  The distal phalanx of the left middle finger with minor erythema and minimal swelling. This is just proximal to the nail. No fluctuance. Full range of motion of the IP joints. No areas of broken skin. There is evidence of manipulation of the cuticle where she has been biting her nails. Right index finger has a minor swelling and  tenderness to the proximal phalanx, extensor surface. The swelling is mild. There is no tension. No discoloration. Demonstrates full flexion and extension. Right third finger with paronychia. Full range of motion of IP joints and MCP. Minimal swelling to the distal phalanx. Again noted manipulation to the nail from biting them.  Neurological: She is alert.  Skin: Skin is warm and dry.  Psychiatric: She has a normal mood and affect.  Nursing note and vitals reviewed.   ED Course  .Marland KitchenIncision and Drainage Date/Time: 02/10/2015 5:14 PM Performed by: Phineas Real, Nikolaus Pienta Authorized by: Bradd Canary D Consent: Verbal consent obtained. Risks and benefits: risks, benefits and alternatives were discussed Consent given by: patient Patient understanding: patient states understanding of the procedure being performed Patient identity confirmed: verbally with patient Body area: upper extremity Location details: right index finger Local anesthetic: topical anesthetic Scalpel size: 11 Incision type: single straight Incision depth: dermal Complexity: simple Drainage: purulent Drainage amount: moderate Wound treatment: wound left open Patient tolerance: Patient tolerated the procedure well with no immediate complications Comments: Betadine prep. Anesthesia with cold spray. Tangential incision with 11 blade.   (including critical care time)  Labs Review Labs Reviewed - No data to display  Imaging Review No results found.   Visual Acuity Review  Right Eye Distance:   Left Eye Distance:   Bilateral Distance:    Right Eye Near:   Left Eye Near:    Bilateral Near:         MDM   1. Tendinitis of finger   2. Paronychia of finger of right hand    I&D of paronychia performed. Soaked in warm Betadine water. Dressing applied. Keflex as directed. Tramadol 50 mg #15 and Cataflam for pain prn. Stop biting  nails Finger splint to the right index finger. Follow-up here PCP     Hayden Rasmussen,  NP 02/10/15 1720

## 2015-02-10 NOTE — ED Notes (Signed)
Pt reports infection to fingers onset 3 days on bilateral hands; right middle finger is swollen around nailbed Has been soaking hands w/no relief A&O x4... No acute distress.

## 2015-02-10 NOTE — Discharge Instructions (Signed)
Fingertip Infection When an infection is around the nail, it is called a paronychia. When it appears over the tip of the finger, it is called a felon. These infections are due to minor injuries or cracks in the skin. If they are not treated properly, they can lead to bone infection and permanent damage to the fingernail. Incision and drainage is necessary if a pus pocket (an abscess) has formed. Antibiotics and pain medicine may also be needed. Keep your hand elevated for the next 2-3 days to reduce swelling and pain. If a pack was placed in the abscess, it should be removed in 1-2 days by your caregiver. Soak the finger in warm water for 20 minutes 4 times daily to help promote drainage. Keep the hands as dry as possible. Wear protective gloves with cotton liners. See your caregiver for follow-up care as recommended.  HOME CARE INSTRUCTIONS   Keep wound clean, dry and dressed as suggested by your caregiver.  Soak in warm salt water for fifteen minutes, four times per day for bacterial infections.  Your caregiver will prescribe an antibiotic if a bacterial infection is suspected. Take antibiotics as directed and finish the prescription, even if the problem appears to be improving before the medicine is gone.  Only take over-the-counter or prescription medicines for pain, discomfort, or fever as directed by your caregiver. SEEK IMMEDIATE MEDICAL CARE IF:  There is redness, swelling, or increasing pain in the wound.  Pus or any other unusual drainage is coming from the wound.  An unexplained oral temperature above 102 F (38.9 C) develops.  You notice a foul smell coming from the wound or dressing. MAKE SURE YOU:   Understand these instructions.  Monitor your condition.  Contact your caregiver if you are getting worse or not improving.   This information is not intended to replace advice given to you by your health care provider. Make sure you discuss any questions you have with your  health care provider.   Document Released: 02/29/2004 Document Revised: 04/15/2011 Document Reviewed: 07/11/2014 Elsevier Interactive Patient Education 2016 [employer] Inc[employer]  Paronychia  Paronychia is an infection of the skin. It happens near a fingernail or toenail. It may cause pain and swelling around the nail. Usually, it is not serious and it clears up with treatment. HOME CARE  Soak the fingers or toes in warm water as told by your doctor. You may be told to do this for 20 minutes, 2-3 times a day.  Keep the area dry when you are not soaking it.  Take medicines only as told by your doctor.  If you were given an antibiotic medicine, finish all of it even if you start to feel better.  Keep the affected area clean.  Do not try to drain a fluid-filled bump yourself.  Wear rubber gloves when putting your hands in water.  Wear gloves if your hands might touch cleaners or chemicals.  Follow your doctor's instructions about:  Wound care.  Bandage (dressing) changes and removal. GET HELP IF:  Your symptoms get worse or do not improve.  You have a fever or chills.  You have redness spreading from the affected area.  You have more fluid, blood, or pus coming from the affected area.  Your finger or knuckle is swollen or is hard to move.   This information is not intended to replace advice given to you by your health care provider. Make sure you discuss any questions you have with your health care provider.  Document Released: 01/09/2009 Document Revised: 06/07/2014 Document Reviewed: 12/29/2013 Elsevier Interactive Patient Education 2016 Else[employer] Inc.  Tendinitis and Tenosynovitis  Tendinitis is inflammation of the tendon. Tenosynovitis is inflammation of the lining around the tendon (tendon sheath). These painful conditions often occur at once. Tendons attach muscle to bone. To move a limb, force from the muscle moves through the tendon, to the bone. These conditions often  cause increased pain when moving. Tendinitis may be caused by a small or partial tear in the tendon.  SYMPTOMS   Pain, tenderness, redness, bruising, or swelling at the injury.  Loss of normal joint movement.  Pain that gets worse with use of the muscle and joint attached to the tendon.  Weakness in the tendon, caused by calcium build up that may occur with tendinitis.  Commonly affected tendons:  Achilles tendon (calf of leg).  Rotator cuff (shoulder joint).  Patellar tendon (kneecap to shin).  Peroneal tendon (ankle).  Posterior tibial tendon (inner ankle).  Biceps tendon (in front of shoulder). CAUSES   Sudden strain on a flexed muscle, muscle overuse, sudden increase or change in activity, vigorous activity.  Result of a direct hit (less common).  Poor muscle action (biomechanics). RISK INCREASES WITH:  Injury (trauma).  Too much exercise.  Sudden change in athletic activity.  Incorrect exercise form or technique.  Poor strength and flexibility.  Not warming-up properly before activity.  Returning to activity before healing is complete. PREVENTION   Warm-up and stretch properly before activity.  Maintain physical fitness:  Joint flexibility.  Muscle strength and endurance.  Fitness that increases heart rate.  Learn and use proper exercise techniques.  Use rehabilitation exercises to strengthen weak muscles and tendons.  Ice the tendon after activity, to reduce recurring inflammation.  Wear proper fitting protective equipment for specific tendons, when indicated. PROGNOSIS  When treated properly, can be cured in 6 to 8 weeks. Recovery may take longer, depending on degree of injury.  RELATED COMPLICATIONS   Re-injury or recurring symptoms.  Permanent weakness or joint stiffness, if injury is severe and recovery is not completed.  Delayed healing, if sports are started before healing is complete.  Tearing apart (rupture) of the inflamed  tendon. Tendinitis means the tendon is injured and must recover. TREATMENT  Treatment first involves ice, medicine, and rest from aggravating activities. This reduces pain and inflammation. Modifying your activity may be considered to prevent recurring injury. A brace, elastic bandage wrap, splint, cast, or sling may be prescribed to protect the joint for a short period. After that period, strengthening and stretching exercise may help to regain strength and full range of motion. If the condition persists, despite non-surgical treatment, surgery may be recommended to remove the inflamed tendon lining. Corticosteroid injections may be given to reduce inflammation. However, these injections may weaken the tendon and increase your risk for tendon rupture. MEDICATION   If pain medicine is needed, nonsteroidal anti-inflammatory medicines (aspirin and ibuprofen), or other minor pain relievers (acetaminophen), are often recommended.  Do not take pain medicine for 7 days before surgery.  Prescription pain relievers are usually prescribed only after surgery. Use only as directed and only as much as you need.  Ointments applied to the skin may be helpful.  Corticosteroid injections may be given to reduce inflammation. However, this may increase your risk of a tendon rupture. HEAT AND COLD  Cold treatment (icing) relieves pain and reduces inflammation. Cold treatment should be applied for 10 to 15 minutes every 2 to 3  hours, and immediately after activity that aggravates your symptoms. Use ice packs or an ice massage.  Heat treatment may be used before performing stretching and strengthening activities prescribed by your caregiver, physical therapist, or athletic trainer. Use a heat pack or a warm water soak. SEEK MEDICAL CARE IF:   Symptoms get worse or do not improve, despite treatment.  Pain becomes too much to tolerate.  You develop numbness or tingling.  Toes become cold, or toenails become  blue, gray, or dark colored.  New, unexplained symptoms develop. (Drugs used in treatment may produce side effects.)   This information is not intended to replace advice given to you by your health care provider. Make sure you discuss any questions you have with your health care provider.   Document Released: 01/21/2005 Document Revised: 04/15/2011 Document Reviewed: 05/05/2008 Elsevier Interactive Patient Education [employer] Elsevier Inc.

## 2015-03-16 ENCOUNTER — Other Ambulatory Visit: Payer: Self-pay

## 2015-03-16 ENCOUNTER — Emergency Department (HOSPITAL_COMMUNITY)
Admission: EM | Admit: 2015-03-16 | Discharge: 2015-03-16 | Disposition: A | Discharge status: Home / Self Care | Payer: Commercial Managed Care - PPO | Attending: Emergency Medicine | Admitting: Emergency Medicine

## 2015-03-16 ENCOUNTER — Encounter (HOSPITAL_COMMUNITY): Payer: Self-pay | Admitting: Emergency Medicine

## 2015-03-16 ENCOUNTER — Emergency Department (HOSPITAL_COMMUNITY): Payer: Commercial Managed Care - PPO

## 2015-03-16 DIAGNOSIS — E119 Type 2 diabetes mellitus without complications: Secondary | ICD-10-CM | POA: Diagnosis not present

## 2015-03-16 DIAGNOSIS — F329 Major depressive disorder, single episode, unspecified: Secondary | ICD-10-CM | POA: Insufficient documentation

## 2015-03-16 DIAGNOSIS — R05 Cough: Secondary | ICD-10-CM | POA: Diagnosis not present

## 2015-03-16 DIAGNOSIS — Z792 Long term (current) use of antibiotics: Secondary | ICD-10-CM | POA: Diagnosis not present

## 2015-03-16 DIAGNOSIS — Z79899 Other long term (current) drug therapy: Secondary | ICD-10-CM | POA: Diagnosis not present

## 2015-03-16 DIAGNOSIS — Z794 Long term (current) use of insulin: Secondary | ICD-10-CM | POA: Insufficient documentation

## 2015-03-16 DIAGNOSIS — R059 Cough, unspecified: Secondary | ICD-10-CM

## 2015-03-16 DIAGNOSIS — Z7984 Long term (current) use of oral hypoglycemic drugs: Secondary | ICD-10-CM | POA: Insufficient documentation

## 2015-03-16 DIAGNOSIS — M7989 Other specified soft tissue disorders: Secondary | ICD-10-CM | POA: Insufficient documentation

## 2015-03-16 LAB — BASIC METABOLIC PANEL
ANION GAP: 8 (ref 5–15)
BUN: 12 mg/dL (ref 6–20)
CHLORIDE: 105 mmol/L (ref 101–111)
CO2: 26 mmol/L (ref 22–32)
Calcium: 9.2 mg/dL (ref 8.9–10.3)
Creatinine, Ser: 0.48 mg/dL (ref 0.44–1.00)
GFR calc Af Amer: >60 mL/min (ref >60)
GFR calc non Af Amer: >60 mL/min (ref >60)
GLUCOSE: 154 mg/dL — AB (ref 65–99)
POTASSIUM: 3.9 mmol/L (ref 3.5–5.1)
Sodium: 139 mmol/L (ref 135–145)

## 2015-03-16 LAB — BRAIN NATRIURETIC PEPTIDE: B Natriuretic Peptide: 69.1 pg/mL (ref 0.0–100.0)

## 2015-03-16 LAB — HEPATIC FUNCTION PANEL
ALBUMIN: 3 g/dL — AB (ref 3.5–5.0)
ALK PHOS: 261 U/L — AB (ref 38–126)
ALT: 32 U/L (ref 14–54)
AST: 35 U/L (ref 15–41)
BILIRUBIN TOTAL: 0.8 mg/dL (ref 0.3–1.2)
Bilirubin, Direct: 0.3 mg/dL (ref 0.1–0.5)
Indirect Bilirubin: 0.5 mg/dL (ref 0.3–0.9)
Total Protein: 8.2 g/dL — ABNORMAL HIGH (ref 6.5–8.1)

## 2015-03-16 LAB — CBC
HCT: 33.6 % — ABNORMAL LOW (ref 36.0–46.0)
HEMOGLOBIN: 11.6 g/dL — AB (ref 12.0–15.0)
MCH: 28.9 pg (ref 26.0–34.0)
MCHC: 34.5 g/dL (ref 30.0–36.0)
MCV: 83.6 fL (ref 78.0–100.0)
Platelets: 186 10*3/uL (ref 150–400)
RBC: 4.02 MIL/uL (ref 3.87–5.11)
RDW: 13.6 % (ref 11.5–15.5)
WBC: 4.4 10*3/uL (ref 4.0–10.5)

## 2015-03-16 MED ORDER — POTASSIUM CHLORIDE ER 10 MEQ PO TBCR: 10.0000 meq | EXTENDED_RELEASE_TABLET | Freq: Every day | ORAL | Status: DC

## 2015-03-16 MED ORDER — FUROSEMIDE 20 MG PO TABS: 20.0000 mg | ORAL_TABLET | Freq: Every day | ORAL | Status: DC

## 2015-03-16 NOTE — ED Provider Notes (Signed)
CSN: [id number]     Arrival date & time 03/16/15  1117 History   First MD Initiated Contact with Patient 03/16/15 1343     Chief Complaint  Patient presents with  . Leg Swelling     (Consider location/radiation/quality/duration/timing/severity/associated sxs/prior Treatment) HPI   48 y f w pmh dm, coming in with one week of leg swelling and sob while sleeping but not during the day.  No current sob, no chest pain.  No h/o chf.  The leg swelling is bilateral, no previous dvt/pe, no chest pain.  No rash, no fever.  No h/o kidney or liver disease.  Pt isn't on any diuretics at home.   Past Medical History  Diagnosis Date  . Diabetes mellitus   . Morbid obesity (HCC)   . Depression   . Allergy    Past Surgical History  Procedure Laterality Date  . Tubal ligation     Family History  Problem Relation Age of Onset  . Diabetes Mother   . Lymphoma Father   . Lymphoma Brother    Social History  Substance Use Topics  . Smoking status: Never Smoker   . Smokeless tobacco: Never Used  . Alcohol Use: No   OB History    No data available     Review of Systems  Constitutional: Negative for fever and chills.  HENT: Negative for nosebleeds.   Eyes: Negative for visual disturbance.  Respiratory: Negative for cough and shortness of breath.   Cardiovascular: Positive for leg swelling. Negative for chest pain.  Gastrointestinal: Negative for nausea, vomiting, abdominal pain, diarrhea and constipation.  Genitourinary: Negative for dysuria.  Skin: Negative for rash.  Neurological: Negative for weakness.  All other systems reviewed and are negative.     Allergies  Codeine  Home Medications   Prior to Admission medications   Medication Sig Start Date End Date Taking? Authorizing Provider  Fexofenadine HCl (ALLEGRA PO) Take 1 tablet by mouth daily as needed (allergies.).   Yes Historical Provider, MD  ibuprofen (ADVIL,MOTRIN) 200 MG tablet Take 200 mg by mouth every 6 (six) hours  as needed.   Yes Historical Provider, MD  insulin NPH Human (HUMULIN N,NOVOLIN N) 100 UNIT/ML injection Inject 20 Units into the skin at bedtime.   Yes Historical Provider, MD  insulin regular (NOVOLIN R) 100 units/mL injection Inject 0.05 mLs (5 Units total) into the skin 3 (three) times daily before meals. As needed for glucose over 250 09/19/13  Yes Kristen N Ward, DO  metFORMIN (GLUCOPHAGE) 500 MG tablet TAKE ONE TABLET BY MOUTH TWICE DAILY WITH MEALS 01/31/14  Yes Tiffany L Reed, DO  cephALEXin (KEFLEX) 500 MG capsule Take 1 capsule (500 mg total) by mouth 4 (four) times daily. 02/10/15   Hayden Rasmussen, NP  cyclobenzaprine (FLEXERIL) 5 MG tablet Take 1 tablet (5 mg total) by mouth 3 (three) times daily as needed for muscle spasms. 09/02/14   Tilden Fossa, MD  diclofenac (CATAFLAM) 50 MG tablet Take 1 tablet (50 mg total) by mouth 3 (three) times daily. One tablet TID with food prn pain. 02/10/15   Hayden Rasmussen, NP  furosemide (LASIX) 20 MG tablet Take 1 tablet (20 mg total) by mouth daily. 03/16/15 04/06/15  Silas Flood, MD  potassium chloride (K-DUR) 10 MEQ tablet Take 1 tablet (10 mEq total) by mouth daily. 03/16/15   Silas Flood, MD  traMADol (ULTRAM) 50 MG tablet Take 1 tablet (50 mg total) by mouth every 6 (six) hours as needed. Patient not  taking: Reported on 03/16/2015 02/10/15   Hayden Rasmussen, NP   BP 150/62 mmHg  Pulse 66  Temp(Src) 98.2 F (36.8 C)  Resp 20  SpO2 98%  LMP 03/05/2015 (Approximate) Physical Exam  Constitutional: She is oriented to person, place, and time. No distress.  HENT:  Head: Normocephalic and atraumatic.  Eyes: EOM are normal. Pupils are equal, round, and reactive to light.  Neck: Normal range of motion. Neck supple.  Cardiovascular: Normal rate and intact distal pulses.   Pulmonary/Chest: Effort normal and breath sounds normal. No respiratory distress.  Abdominal: Soft. There is no tenderness.  Musculoskeletal: Normal range of motion. She exhibits edema (2+ bilateral lower  extremities).  Neurological: She is alert and oriented to person, place, and time.  Skin: No rash noted. She is not diaphoretic.  Psychiatric: She has a normal mood and affect.    ED Course  Procedures (including critical care time) Labs Review Labs Reviewed  CBC - Abnormal; Notable for the following:    Hemoglobin 11.6 (*)    HCT 33.6 (*)    All other components within normal limits  BASIC METABOLIC PANEL - Abnormal; Notable for the following:    Glucose, Bld 154 (*)    All other components within normal limits  HEPATIC FUNCTION PANEL - Abnormal; Notable for the following:    Total Protein 8.2 (*)    Albumin 3.0 (*)    Alkaline Phosphatase 261 (*)    All other components within normal limits  BRAIN NATRIURETIC PEPTIDE    Imaging Review Dg Chest 2 View  03/16/2015  CLINICAL DATA:  Bilateral leg swelling since last week EXAM: CHEST  2 VIEW COMPARISON:  March 22, 2014 FINDINGS: The heart size and mediastinal contours are within normal limits. There is mild increased pulmonary interstitium bilaterally. There is no focal pneumonia or pleural effusion. The visualized skeletal structures are unremarkable. IMPRESSION: Mild increased pulmonary interstitium bilaterally. This can be seen in viral etiology or interstitial edema. Electronically Signed   By: Sherian Rein M.D.   On: 03/16/2015 14:27   I have personally reviewed and evaluated these images and lab results as part of my medical decision-making.   EKG Interpretation   Date/Time:  Thursday March 16 2015 14:54:15 EST Ventricular Rate:  61 PR Interval:  186 QRS Duration: 96 QT Interval:  438 QTC Calculation: 441 R Axis:   20 Text Interpretation:  Sinus rhythm Anterior infarct, old Borderline  repolarization abnormality Baseline wander in lead(s) III ED PHYSICIAN  INTERPRETATION AVAILABLE IN [facility] Confirmed by TEST, Record  ([id number]) on 03/17/2015 7:17:22 AM      MDM   Final diagnoses:   leg swelling     44 y f w pmh dm, coming in with one week of leg swelling and sob while sleeping but not during the day.   Exam as above Will obtain cbc/bmp/bnp/lft's to eval for liver disease, kidney disease.  Will obtain cxr to eval for pulm edema given the intermittent nocturnal dyspnea.  Cbc/bmp unremarkable.  lft's and bnp unremarkable. cxr unremarkable, possible mild interstitial infiltrate, satting well w/ normal bnp, low suspicion for new onset heart failure.  Will start on low dose diuretic and have her f/u with pcp for likely outpatient TTE.  Doubt dvt given bilateral leg swelling w/o asymmetry and no h/o dvt in the past.  No skin findings to suggest cellulitis    Silas Flood, MD 03/17/15 1353  Silas Flood, MD 03/17/15 1354  Tilden Fossa, MD 03/18/15 [phone]

## 2015-03-16 NOTE — ED Notes (Signed)
Pt reports bilateral lower leg swelling for the last week. Denies calf pain. Pt reports her PCP doctor left the practice, currently no PCP. Denies CP, SOB, kidney problems. Pt awake, alert, oriented x4, VSS.

## 2015-03-16 NOTE — Discharge Instructions (Signed)
Furosemide tablets °What is this medicine? °FUROSEMIDE (fyoor [place] se mide) is a diuretic. It helps you make more urine and to lose salt and excess water from your body. This medicine is used to treat high blood pressure, and edema or swelling from heart, kidney, or liver disease. °This medicine may be used for other purposes; ask your health care provider or pharmacist if you have questions. °What should I tell my health care provider before I take this medicine? °They need to know if you have any of these conditions: °-abnormal blood electrolytes °-diarrhea or vomiting °-gout °-heart disease °-kidney disease, small amounts of urine, or difficulty passing urine °-liver disease °-thyroid disease °-an unusual or allergic reaction to furosemide, sulfa drugs, other medicines, foods, dyes, or preservatives °-pregnant or trying to get pregnant °-breast-feeding °How should I use this medicine? °Take this medicine by mouth with a glass of water. Follow the directions on the prescription label. You may take this medicine with or without food. If it upsets your stomach, take it with food or milk. Do not take your medicine more often than directed. Remember that you will need to pass more urine after taking this medicine. Do not take your medicine at a time of day that will cause you problems. Do not take at bedtime. °Talk to your pediatrician regarding the use of this medicine in children. While this drug may be prescribed for selected conditions, precautions do apply. °Overdosage: If you think you have taken too much of this medicine contact a poison control center or emergency room at once. °NOTE: This medicine is only for you. Do not share this medicine with others. °What if I miss a dose? °If you miss a dose, take it as soon as you can. If it is almost time for your next dose, take only that dose. Do not take double or extra doses. °What may interact with this medicine? °-aspirin and aspirin-like medicines °-certain  antibiotics °-chloral hydrate °-cisplatin °-cyclosporine °-digoxin °-diuretics °-laxatives °-lithium °-medicines for blood pressure °-medicines that relax muscles for surgery °-methotrexate °-NSAIDs, medicines for pain and inflammation like ibuprofen, naproxen, or indomethacin °-phenytoin °-steroid medicines like prednisone or cortisone °-sucralfate °-thyroid hormones °This list may not describe all possible interactions. Give your health care provider a list of all the medicines, herbs, non-prescription drugs, or dietary supplements you use. Also tell them if you smoke, drink alcohol, or use illegal drugs. Some items may interact with your medicine. °What should I watch for while using this medicine? °Visit your doctor or health care professional for regular checks on your progress. Check your blood pressure regularly. Ask your doctor or health care professional what your blood pressure should be, and when you should contact him or her. If you are a diabetic, check your blood sugar as directed. °You may need to be on a special diet while taking this medicine. Check with your doctor. Also, ask how many glasses of fluid you need to drink a day. You must not get dehydrated. °You may get drowsy or dizzy. Do not drive, use machinery, or do anything that needs mental alertness until you know how this drug affects you. Do not stand or sit up quickly, especially if you are an older patient. This reduces the risk of dizzy or fainting spells. Alcohol can make you more drowsy and dizzy. Avoid alcoholic drinks. °This medicine can make you more sensitive to the sun. Keep out of the sun. If you cannot avoid being in the sun, wear protective clothing and   use sunscreen. Do not use sun lamps or tanning beds/booths. °What side effects may I notice from receiving this medicine? °Side effects that you should report to your doctor or health care professional as soon as possible: °-blood in urine or stools °-dry mouth °-fever or  chills °-hearing loss or ringing in the ears °-irregular heartbeat °-muscle pain or weakness, cramps °-skin rash °-stomach upset, pain, or nausea °-tingling or numbness in the hands or feet °-unusually weak or tired °-vomiting or diarrhea °-yellowing of the eyes or skin °Side effects that usually do not require medical attention (report to your doctor or health care professional if they continue or are bothersome): °-headache °-loss of appetite °-unusual bleeding or bruising °This list may not describe all possible side effects. Call your doctor for medical advice about side effects. You may report side effects to FDA at 1-800-FDA-1088. °Where should I keep my medicine? °Keep out of the reach of children. °Store at room temperature between 15 and 30 degrees C (59 and 86 degrees F). Protect from light. Throw away any unused medicine after the expiration date. °NOTE: This sheet is a summary. It may not cover all possible information. If you have questions about this medicine, talk to your doctor, pharmacist, or health care provider. °  °© 2016, [employer]. (2014-04-13 13:49:50) ° °

## 2015-03-16 NOTE — ED Notes (Signed)
Contacted IT in regards to EKG not being exported into MUSE/EPIC.

## 2015-10-13 ENCOUNTER — Emergency Department (HOSPITAL_COMMUNITY): Payer: Commercial Managed Care - PPO

## 2015-10-13 ENCOUNTER — Emergency Department (HOSPITAL_COMMUNITY)
Admission: EM | Admit: 2015-10-13 | Discharge: 2015-10-13 | Disposition: A | Discharge status: Home / Self Care | Payer: Commercial Managed Care - PPO | Attending: Emergency Medicine | Admitting: Emergency Medicine

## 2015-10-13 ENCOUNTER — Encounter (HOSPITAL_COMMUNITY): Payer: Self-pay | Admitting: Emergency Medicine

## 2015-10-13 DIAGNOSIS — M533 Sacrococcygeal disorders, not elsewhere classified: Secondary | ICD-10-CM | POA: Diagnosis present

## 2015-10-13 DIAGNOSIS — K611 Rectal abscess: Secondary | ICD-10-CM

## 2015-10-13 DIAGNOSIS — E119 Type 2 diabetes mellitus without complications: Secondary | ICD-10-CM | POA: Insufficient documentation

## 2015-10-13 DIAGNOSIS — Z7984 Long term (current) use of oral hypoglycemic drugs: Secondary | ICD-10-CM | POA: Diagnosis not present

## 2015-10-13 DIAGNOSIS — Z794 Long term (current) use of insulin: Secondary | ICD-10-CM | POA: Insufficient documentation

## 2015-10-13 LAB — COMPREHENSIVE METABOLIC PANEL
ALBUMIN: 3.1 g/dL — AB (ref 3.5–5.0)
ALK PHOS: 123 U/L (ref 38–126)
ALT: 25 U/L (ref 14–54)
AST: 33 U/L (ref 15–41)
Anion gap: 8 (ref 5–15)
BUN: 8 mg/dL (ref 6–20)
CALCIUM: 9.3 mg/dL (ref 8.9–10.3)
CO2: 24 mmol/L (ref 22–32)
CREATININE: 0.61 mg/dL (ref 0.44–1.00)
Chloride: 103 mmol/L (ref 101–111)
GFR calc Af Amer: >60 mL/min (ref >60)
GFR calc non Af Amer: >60 mL/min (ref >60)
GLUCOSE: 106 mg/dL — AB (ref 65–99)
Potassium: 3.9 mmol/L (ref 3.5–5.1)
SODIUM: 135 mmol/L (ref 135–145)
Total Bilirubin: 0.7 mg/dL (ref 0.3–1.2)
Total Protein: 7.5 g/dL (ref 6.5–8.1)

## 2015-10-13 LAB — URINALYSIS, ROUTINE W REFLEX MICROSCOPIC
BILIRUBIN URINE: NEGATIVE
GLUCOSE, UA: NEGATIVE mg/dL
Hgb urine dipstick: NEGATIVE
KETONES UR: NEGATIVE mg/dL
LEUKOCYTES UA: NEGATIVE
Nitrite: NEGATIVE
PH: 8.5 — AB (ref 5.0–8.0)
PROTEIN: NEGATIVE mg/dL
Specific Gravity, Urine: 1.021 (ref 1.005–1.030)

## 2015-10-13 LAB — CBC WITH DIFFERENTIAL/PLATELET
BASOS PCT: 0 %
Basophils Absolute: 0 10*3/uL (ref 0.0–0.1)
EOS ABS: 0 10*3/uL (ref 0.0–0.7)
Eosinophils Relative: 1 %
HCT: 35.5 % — ABNORMAL LOW (ref 36.0–46.0)
HEMOGLOBIN: 12.1 g/dL (ref 12.0–15.0)
LYMPHS ABS: 1.5 10*3/uL (ref 0.7–4.0)
Lymphocytes Relative: 18 %
MCH: 29.4 pg (ref 26.0–34.0)
MCHC: 34.1 g/dL (ref 30.0–36.0)
MCV: 86.2 fL (ref 78.0–100.0)
Monocytes Absolute: 0.6 10*3/uL (ref 0.1–1.0)
Monocytes Relative: 7 %
NEUTROS PCT: 74 %
Neutro Abs: 6.4 10*3/uL (ref 1.7–7.7)
Platelets: 206 10*3/uL (ref 150–400)
RBC: 4.12 MIL/uL (ref 3.87–5.11)
RDW: 12.5 % (ref 11.5–15.5)
WBC: 8.5 10*3/uL (ref 4.0–10.5)

## 2015-10-13 LAB — POC URINE PREG, ED: PREG TEST UR: NEGATIVE

## 2015-10-13 MED ORDER — CEPHALEXIN 500 MG PO CAPS: 500.0000 mg | ORAL_CAPSULE | Freq: Four times a day (QID) | ORAL | 0 refills | Status: DC

## 2015-10-13 MED ORDER — ACETAMINOPHEN 325 MG PO TABS
650.0000 mg | ORAL_TABLET | Freq: Once | ORAL | Status: AC
  Administered 2015-10-13: 650 mg via ORAL
  Filled 2015-10-13: qty 2

## 2015-10-13 MED ORDER — CEPHALEXIN 250 MG PO CAPS
500.0000 mg | ORAL_CAPSULE | Freq: Once | ORAL | Status: AC
  Administered 2015-10-13: 500 mg via ORAL
  Filled 2015-10-13: qty 2

## 2015-10-13 MED ORDER — IBUPROFEN 800 MG PO TABS: 800.0000 mg | ORAL_TABLET | Freq: Three times a day (TID) | ORAL | 0 refills | Status: DC

## 2015-10-13 MED ORDER — IBUPROFEN 800 MG PO TABS
800.0000 mg | ORAL_TABLET | Freq: Once | ORAL | Status: AC
  Administered 2015-10-13: 800 mg via ORAL
  Filled 2015-10-13: qty 1

## 2015-10-13 MED ORDER — IOPAMIDOL (ISOVUE-300) INJECTION 61%
INTRAVENOUS | Status: AC
  Administered 2015-10-13: 100 mL
  Filled 2015-10-13: qty 100

## 2015-10-13 NOTE — Discharge Instructions (Signed)
Take antibiotics as prescribed. Continue washing with Dial soap. Take ibuprofen as needed for pain. Follow up with you primary care provider next week for re-evaluation. Return to the ED if you experience severe worsening of your symptoms, increased fevers, vomiting, pain with bowel movement or blood in stool.

## 2015-10-13 NOTE — ED Provider Notes (Signed)
[facility] DEPT Provider Note   CSN: [id number] Arrival date & time: 10/13/15  1535     History   Chief Complaint Chief Complaint  Patient presents with  . Tailbone Pain    HPI [name] is a 45 y.o. female with a past medical history of DM, morbid obesity who presents to the ED today complaining of tailbone pain and generalized body aches. Patient states that 3 days ago she was attempting to move her bed and was sitting on the floor. The following day she noticed that her tailbone began to hurt. She states it feels "hard" around the top of her tailbone. She initially thought this was a boil but states that it feels different to hurt. Pain is worsened with sitting down. She has tried taking ibuprofen without relief of her symptoms. Pt reports associated, chills, malaise and generalized body aches. Last Bm was this morning and was normal in color and caliber. No pain with bowel movements.   HPI  Past Medical History:  Diagnosis Date  . Allergy   . Depression   . Diabetes mellitus   . Morbid obesity [facility])     Patient Active Problem List   Diagnosis Date Noted  . Acute sinus infection 07/07/2013  . Gall stones 07/07/2013  . Osteoarthritis 03/09/2013  . Transaminitis 01/26/2013  . Depression 01/26/2013  . DM type 2 goal A1C below 7.5 01/26/2013  . Type II or unspecified type diabetes mellitus without mention of complication, uncontrolled 07/15/2012  . Disorder of bone and cartilage 07/15/2012  . Morbid obesity (HCC) 07/15/2012  . Other malaise and fatigue 07/15/2012    Past Surgical History:  Procedure Laterality Date  . TUBAL LIGATION      OB History    No data available       Home Medications    Prior to Admission medications   Medication Sig Start Date End Date Taking? Authorizing Provider  cephALEXin (KEFLEX) 500 MG capsule Take 1 capsule (500 mg total) by mouth 4 (four) times daily. 02/10/15   Hayden Rasmussen, NP  cyclobenzaprine (FLEXERIL) 5 MG tablet  Take 1 tablet (5 mg total) by mouth 3 (three) times daily as needed for muscle spasms. 09/02/14   Tilden Fossa, MD  diclofenac (CATAFLAM) 50 MG tablet Take 1 tablet (50 mg total) by mouth 3 (three) times daily. One tablet TID with food prn pain. 02/10/15   Hayden Rasmussen, NP  Fexofenadine HCl (ALLEGRA PO) Take 1 tablet by mouth daily as needed (allergies.).    Historical Provider, MD  furosemide (LASIX) 20 MG tablet Take 1 tablet (20 mg total) by mouth daily. 03/16/15 04/06/15  Silas Flood, MD  ibuprofen (ADVIL,MOTRIN) 200 MG tablet Take 200 mg by mouth every 6 (six) hours as needed.    Historical Provider, MD  insulin NPH Human (HUMULIN N,NOVOLIN N) 100 UNIT/ML injection Inject 20 Units into the skin at bedtime.    Historical Provider, MD  insulin regular (NOVOLIN R) 100 units/mL injection Inject 0.05 mLs (5 Units total) into the skin 3 (three) times daily before meals. As needed for glucose over 250 09/19/13   Kristen N Ward, DO  metFORMIN (GLUCOPHAGE) 500 MG tablet TAKE ONE TABLET BY MOUTH TWICE DAILY WITH MEALS 01/31/14   Tiffany L Reed, DO  potassium chloride (K-DUR) 10 MEQ tablet Take 1 tablet (10 mEq total) by mouth daily. 03/16/15   Silas Flood, MD  traMADol (ULTRAM) 50 MG tablet Take 1 tablet (50 mg total) by mouth every 6 (six)  hours as needed. Patient not taking: Reported on 03/16/2015 02/10/15   Hayden Rasmussenavid Mabe, NP    Family History Family History  Problem Relation Age of Onset  . Diabetes Mother   . Lymphoma Father   . Lymphoma Brother     Social History Social History  Substance Use Topics  . Smoking status: Never Smoker  . Smokeless tobacco: Never Used  . Alcohol use No     Allergies   Codeine   Review of Systems Review of Systems  All other systems reviewed and are negative.    Physical Exam Updated Vital Signs BP 146/86 (BP Location: Left Arm) Comment (BP Location): taken in pt forearm  Pulse 85   Temp 100.2 F (37.9 C) (Oral)   Resp 18   Ht 5\' 7"  (1.702 m)   Wt (!) 140.6  kg   LMP 09/20/2015 (Approximate)   SpO2 100%   BMI 48.55 kg/m   Physical Exam  Constitutional: She is oriented to person, place, and time. She appears well-developed and well-nourished. No distress.  HENT:  Head: Normocephalic and atraumatic.  Mouth/Throat: No oropharyngeal exudate.  Eyes: Conjunctivae and EOM are normal. Pupils are equal, round, and reactive to light. Right eye exhibits no discharge. Left eye exhibits no discharge. No scleral icterus.  Cardiovascular: Normal rate, regular rhythm, normal heart sounds and intact distal pulses.  Exam reveals no gallop and no friction rub.   No murmur heard. Pulmonary/Chest: Effort normal and breath sounds normal. No respiratory distress. She has no wheezes. She has no rales. She exhibits no tenderness.  Abdominal: Soft. She exhibits no distension. There is no tenderness. There is no guarding.  Genitourinary:  Genitourinary Comments: No rectal tenderness  Musculoskeletal: Normal range of motion. She exhibits no edema.  Neurological: She is alert and oriented to person, place, and time.  Skin: Skin is warm and dry. No rash noted. She is not diaphoretic. No pallor.     Large area of redness and tenderness at apex of gluteal cleft. Tissue feels indurated. No active drainage.   Psychiatric: She has a normal mood and affect. Her behavior is normal.  Nursing note and vitals reviewed.    ED Treatments / Results  Labs (all labs ordered are listed, but only abnormal results are displayed) Labs Reviewed  CBC WITH DIFFERENTIAL/PLATELET - Abnormal; Notable for the following:       Result Value   HCT 35.5 (*)    All other components within normal limits  COMPREHENSIVE METABOLIC PANEL - Abnormal; Notable for the following:    Glucose, Bld 106 (*)    Albumin 3.1 (*)    All other components within normal limits  URINALYSIS, ROUTINE W REFLEX MICROSCOPIC (NOT AT [facility]) - Abnormal; Notable for the following:    pH 8.5 (*)    All other components  within normal limits  POC URINE PREG, ED  CBG MONITORING, ED    EKG  EKG Interpretation None       Radiology Ct Abdomen Pelvis W Contrast  Result Date: 10/13/2015 CLINICAL DATA:  Possible perirectal abscess. Further evaluation requested. Initial encounter. EXAM: CT ABDOMEN AND PELVIS WITH CONTRAST TECHNIQUE: Multidetector CT imaging of the abdomen and pelvis was performed using the standard protocol following bolus administration of intravenous contrast. CONTRAST:  100mL ISOVUE-300 IOPAMIDOL (ISOVUE-300) INJECTION 61% COMPARISON:  CT of the abdomen and pelvis from 06/21/2014 FINDINGS: Lower chest: The visualized lung bases are grossly clear. The visualized portions of the mediastinum are unremarkable. Hepatobiliary: The liver is  unremarkable in appearance. Stones are noted dependently within the gallbladder. The gallbladder is otherwise unremarkable. The common bile duct remains normal in caliber. Pancreas: The pancreas is within normal limits. Spleen: The spleen is unremarkable in appearance. Adrenals/Urinary Tract: The adrenal glands are unremarkable in appearance. The kidneys are within normal limits. There is no evidence of hydronephrosis. No renal or ureteral stones are identified. No perinephric stranding is seen. Stomach/Bowel: The stomach is unremarkable in appearance. The small bowel is within normal limits. The appendix is not visualized; there is no evidence for appendicitis. The colon is unremarkable in appearance. Vascular/Lymphatic: The abdominal aorta is unremarkable in appearance. The inferior vena cava is grossly unremarkable. No retroperitoneal lymphadenopathy is seen. No pelvic sidewall lymphadenopathy is identified. Reproductive: The bladder is decompressed and not well assessed. The uterus contains a large 8.6 cm fibroid, with minimal calcification. No suspicious adnexal masses are seen. The ovaries are grossly symmetric. There is mild apparent soft tissue prominence at the cervix  and vagina, of uncertain significance. If the patient's symptoms persist, pelvic ultrasound would be helpful for further evaluation. The anorectal canal is unremarkable in appearance. There is no evidence of perirectal abscess. Other: Minimal nonspecific skin thickening is noted at the anterior left mid abdomen. Musculoskeletal: No acute osseous abnormalities are identified. The visualized musculature is unremarkable in appearance. IMPRESSION: 1. No acute abnormality seen to explain the patient's symptoms. No evidence of perirectal abscess. The anorectal canal is unremarkable in appearance. 2. Mild apparent soft tissue prominence at the cervix and vagina, of uncertain significance. If the patient's symptoms persist, pelvic ultrasound would be helpful for further evaluation. 3. Cholelithiasis.  Gallbladder otherwise unremarkable. 4. Large 8.6 cm uterine fibroid noted. Electronically Signed   By: Roanna Raider M.D.   On: 10/13/2015 23:05    Procedures Procedures (including critical care time)  Medications Ordered in ED Medications  acetaminophen (TYLENOL) tablet 650 mg (not administered)     Initial Impression / Assessment and Plan / ED Course  I have reviewed the triage vital signs and the nursing notes.  Pertinent labs & imaging results that were available during my care of the patient were reviewed by me and considered in my medical decision making (see chart for details).  Clinical Course    45 year old female with past medical history of diabetes, morbid obesity presents to the ED today complaining of generalized malaise and tailbone pain onset 2 days ago. On exam patient has large area of erythema and tenderness at apex of gluteal cleft with indurated tissue. No fluctuance noted. No tenderness around rectum. Concern for deep abscess. Initial temp in the ED is 100.9 rectally. Overall patient does not appear to be toxic or septic and is in no apparent distress. Labwork unremarkable. No  leukocytosis. CT abdomen and pelvis obtained which does not reveal any distinct abscess. Soft tissue prominence noted at cervix and vagina and uterine fibroid is noted. Do not think this is related to patient's symptoms and can be followed up on as an outpatient. Given overlying erythema and tenderness on exam, we'll treat as cellulitis with antibiotics. Prescription for Keflex given. Patient has close follow-up with PCP and will see them next week for reevaluation. Recommend alternating Tylenol with Profen for fever. Patient does not meet Sirs or sepsis criteria. Feel the patient is safe for discharge with appropriate and close follow-up. Return precautions outlined in patient discharge instructions.  Final Clinical Impressions(s) / ED Diagnoses   Final diagnoses:  Perirectal cellulitis  Coccyx pain  New Prescriptions New Prescriptions   No medications on file     Dub Mikes, PA-C 10/14/15 0343    Samuel Jester, DO 10/17/15 1646

## 2015-10-13 NOTE — ED Triage Notes (Signed)
Pt c/o coccyx pain and generalized body aches x's 1 week.  Pt st's also her blood sugar has been elevated.  Pt denies vomiting.

## 2015-10-17 ENCOUNTER — Encounter (HOSPITAL_COMMUNITY): Payer: Self-pay | Admitting: Emergency Medicine

## 2015-10-17 ENCOUNTER — Emergency Department (HOSPITAL_COMMUNITY)
Admission: EM | Admit: 2015-10-17 | Discharge: 2015-10-17 | Disposition: A | Discharge status: Home / Self Care | Payer: Commercial Managed Care - PPO | Source: Home / Self Care | Attending: Emergency Medicine | Admitting: Emergency Medicine

## 2015-10-17 DIAGNOSIS — Z7984 Long term (current) use of oral hypoglycemic drugs: Secondary | ICD-10-CM

## 2015-10-17 DIAGNOSIS — E119 Type 2 diabetes mellitus without complications: Secondary | ICD-10-CM | POA: Insufficient documentation

## 2015-10-17 DIAGNOSIS — L0291 Cutaneous abscess, unspecified: Secondary | ICD-10-CM

## 2015-10-17 DIAGNOSIS — L03317 Cellulitis of buttock: Secondary | ICD-10-CM | POA: Diagnosis not present

## 2015-10-17 DIAGNOSIS — L0231 Cutaneous abscess of buttock: Secondary | ICD-10-CM

## 2015-10-17 DIAGNOSIS — A419 Sepsis, unspecified organism: Secondary | ICD-10-CM | POA: Diagnosis not present

## 2015-10-17 DIAGNOSIS — Z794 Long term (current) use of insulin: Secondary | ICD-10-CM | POA: Insufficient documentation

## 2015-10-17 MED ORDER — OXYCODONE-ACETAMINOPHEN 5-325 MG PO TABS
ORAL_TABLET | ORAL | Status: AC
  Filled 2015-10-17: qty 1

## 2015-10-17 MED ORDER — TRAMADOL HCL 50 MG PO TABS: 50.0000 mg | ORAL_TABLET | Freq: Two times a day (BID) | ORAL | 0 refills | Status: DC | PRN

## 2015-10-17 MED ORDER — OXYCODONE-ACETAMINOPHEN 5-325 MG PO TABS
1.0000 | ORAL_TABLET | ORAL | Status: DC | PRN
Start: 2015-10-17 — End: 2015-10-17
  Administered 2015-10-17: 1 via ORAL

## 2015-10-17 MED ORDER — SULFAMETHOXAZOLE-TRIMETHOPRIM 800-160 MG PO TABS
1.0000 | ORAL_TABLET | Freq: Two times a day (BID) | ORAL | 0 refills | Status: DC
Start: 2015-10-17 — End: 2015-11-03

## 2015-10-17 MED ORDER — ONDANSETRON HCL 8 MG PO TABS: 8.0000 mg | ORAL_TABLET | Freq: Three times a day (TID) | ORAL | 0 refills | Status: DC | PRN

## 2015-10-17 MED ORDER — ONDANSETRON HCL 4 MG PO TABS
8.0000 mg | ORAL_TABLET | Freq: Once | ORAL | Status: AC
  Administered 2015-10-17: 8 mg via ORAL
  Filled 2015-10-17: qty 2

## 2015-10-17 NOTE — ED Provider Notes (Signed)
[facility] Provider Note   CSN: [id number] Arrival date & time: 10/17/15  4098     History   Chief Complaint Chief Complaint  Patient presents with  . Tailbone Pain    HPI [name] is a 45 y.o. female.  Abscess in her lower back/upper buttocks for several days. Patient was placed on Keflex recently. Wound has been draining since last night. No fever, sweats, chills. Patient is morbidly obese.      Past Medical History:  Diagnosis Date  . Allergy   . Depression   . Diabetes mellitus   . Morbid obesity [facility])     Patient Active Problem List   Diagnosis Date Noted  . Acute sinus infection 07/07/2013  . Gall stones 07/07/2013  . Osteoarthritis 03/09/2013  . Transaminitis 01/26/2013  . Depression 01/26/2013  . DM type 2 goal A1C below 7.5 01/26/2013  . Type II or unspecified type diabetes mellitus without mention of complication, uncontrolled 07/15/2012  . Disorder of bone and cartilage 07/15/2012  . Morbid obesity (HCC) 07/15/2012  . Other malaise and fatigue 07/15/2012    Past Surgical History:  Procedure Laterality Date  . TUBAL LIGATION      OB History    No data available       Home Medications    Prior to Admission medications   Medication Sig Start Date End Date Taking? Authorizing Provider  albuterol (VENTOLIN HFA) 108 (90 Base) MCG/ACT inhaler Inhale 2 puffs into the lungs every 6 (six) hours as needed for wheezing or shortness of breath.  06/15/15 06/14/16 Yes Historical Provider, MD  buPROPion (WELLBUTRIN XL) 150 MG 24 hr tablet Take 150 mg by mouth daily. 06/15/15 06/14/16 Yes Historical Provider, MD  cephALEXin (KEFLEX) 500 MG capsule Take 1 capsule (500 mg total) by mouth 4 (four) times daily. 10/13/15  Yes Samantha Tripp Dowless, PA-C  Fexofenadine HCl (ALLEGRA PO) Take 1 tablet by mouth daily as needed (allergies.).   Yes Historical Provider, MD  ibuprofen (ADVIL,MOTRIN) 200 MG tablet Take 800 mg by mouth at bedtime as needed for  moderate pain.    Yes Historical Provider, MD  ibuprofen (ADVIL,MOTRIN) 800 MG tablet Take 1 tablet (800 mg total) by mouth 3 (three) times daily. 10/13/15  Yes Samantha Tripp Dowless, PA-C  Insulin Glargine (LANTUS SOLOSTAR) 100 UNIT/ML Solostar Pen Inject 15 Units into the skin at bedtime.  10/02/15  Yes Historical Provider, MD  insulin regular (NOVOLIN R) 100 units/mL injection Inject 0.05 mLs (5 Units total) into the skin 3 (three) times daily before meals. As needed for glucose over 250 09/19/13  Yes Kristen N Ward, DO  metFORMIN (GLUCOPHAGE) 500 MG tablet TAKE ONE TABLET BY MOUTH TWICE DAILY WITH MEALS 01/31/14  Yes Tiffany L Reed, DO  metFORMIN (GLUCOPHAGE) 500 MG tablet Take 1,000 mg by mouth 2 (two) times daily. 06/15/15  Yes Historical Provider, MD  naproxen sodium (ANAPROX) 220 MG tablet Take 440 mg by mouth at bedtime as needed (for pain).   Yes Historical Provider, MD  sertraline (ZOLOFT) 25 MG tablet Take 25 mg by mouth daily. 06/15/15 06/14/16 Yes Historical Provider, MD  furosemide (LASIX) 20 MG tablet Take 1 tablet (20 mg total) by mouth daily. 03/16/15 04/06/15  Silas Flood, MD  insulin aspart (NOVOLOG) 100 UNIT/ML injection Inject 8 Units into the skin 3 (three) times daily with meals.  10/02/15   Historical Provider, MD  ondansetron (ZOFRAN) 8 MG tablet Take 1 tablet (8 mg total) by mouth every 8 (  eight) hours as needed for nausea or vomiting. 10/17/15   Donnetta HutchingBrian Quindarius Cabello, MD  sulfamethoxazole-trimethoprim (BACTRIM DS,SEPTRA DS) 800-160 MG tablet Take 1 tablet by mouth 2 (two) times daily. 10/17/15 10/24/15  Donnetta HutchingBrian Clotiel Troop, MD  traMADol (ULTRAM) 50 MG tablet Take 1 tablet (50 mg total) by mouth 2 (two) times daily as needed. 10/17/15   Donnetta HutchingBrian Beau Vanduzer, MD    Family History Family History  Problem Relation Age of Onset  . Diabetes Mother   . Lymphoma Father   . Lymphoma Brother     Social History Social History  Substance Use Topics  . Smoking status: Never Smoker  . Smokeless tobacco: Never Used  .  Alcohol use No     Allergies   Codeine   Review of Systems Review of Systems  All other systems reviewed and are negative.    Physical Exam Updated Vital Signs BP 91/64   Pulse 83   Temp 98.2 F (36.8 C) (Oral)   Resp 18   Wt (!) 310 lb (140.6 kg)   LMP 09/20/2015 (Approximate)   SpO2 100%   BMI 48.55 kg/m   Physical Exam  Constitutional: She is oriented to person, place, and time.  Morbidly obese, no acute distress  HENT:  Head: Normocephalic and atraumatic.  Eyes: Conjunctivae are normal.  Neck: Neck supple.  Cardiovascular: Normal rate and regular rhythm.   Pulmonary/Chest: Effort normal and breath sounds normal.  Abdominal: Soft. Bowel sounds are normal.  Musculoskeletal: Normal range of motion.  Neurological: She is alert and oriented to person, place, and time.  Skin:  Lower back/upper buttocks: Area of induration approximately 8-10 cm in diameter with a central draining core with associated erythema.  Psychiatric: She has a normal mood and affect. Her behavior is normal.  Nursing note and vitals reviewed.    ED Treatments / Results  Labs (all labs ordered are listed, but only abnormal results are displayed) Labs Reviewed - No data to display  EKG  EKG Interpretation None       Radiology No results found.  Procedures Procedures (including critical care time)  Medications Ordered in ED Medications  oxyCODONE-acetaminophen (PERCOCET/ROXICET) 5-325 MG per tablet 1 tablet (1 tablet Oral Given 10/17/15 1004)  oxyCODONE-acetaminophen (PERCOCET/ROXICET) 5-325 MG per tablet (not administered)  ondansetron (ZOFRAN) tablet 8 mg (8 mg Oral Given 10/17/15 1133)     Initial Impression / Assessment and Plan / ED Course  I have reviewed the triage vital signs and the nursing notes.  Pertinent labs & imaging results that were available during my care of the patient were reviewed by me and considered in my medical decision making (see chart for  details).  Clinical Course    Asian has a draining abscess/cellulitis in her lower back/upper buttocks.  She is afebrile and nontoxic appearing. Wound is currently draining a purulent material. She is currently on Keflex. Will add Septra DS twice a day. Also prescriptions for Ultram 50 mg and Zofran 8 mg. Patient will return for fever, chills, any concern.  Final Clinical Impressions(s) / ED Diagnoses   Final diagnoses:  Abscess    New Prescriptions New Prescriptions   ONDANSETRON (ZOFRAN) 8 MG TABLET    Take 1 tablet (8 mg total) by mouth every 8 (eight) hours as needed for nausea or vomiting.   SULFAMETHOXAZOLE-TRIMETHOPRIM (BACTRIM DS,SEPTRA DS) 800-160 MG TABLET    Take 1 tablet by mouth 2 (two) times daily.   TRAMADOL (ULTRAM) 50 MG TABLET    Take 1 tablet (  50 mg total) by mouth 2 (two) times daily as needed.     Donnetta Hutching, MD 10/17/15 1248

## 2015-10-17 NOTE — ED Triage Notes (Signed)
Pt from home with c/o pain from cellulitis infection starting this past Thursday.  Pt reports she was put out of work through the weekend and was only given ibuprofen because she doesn't like taking narcotic pills.  Pt states she went back to work yesterday and the drive was so unbearable they sent her home once she got there.  Pt requesting pain medication.  Symptoms has not worsening.  Pt reports the location has ruptured and now has puss present.  NAD, A&O.

## 2015-10-17 NOTE — Discharge Instructions (Signed)
Soak in hot tub 2 times a day. Clean underwear. Continue your first antibiotic. Add the new antibiotic today along with prescription for pain and nausea medicine. Use Dial soap. Return for high fever, chills, feeling sickly.

## 2015-10-19 ENCOUNTER — Inpatient Hospital Stay (HOSPITAL_COMMUNITY)
Admission: EM | Admit: 2015-10-19 | Discharge: 2015-11-03 | DRG: 853 | Disposition: A | Discharge status: Home / Self Care | Payer: Commercial Managed Care - PPO | Attending: Family Medicine | Admitting: Family Medicine

## 2015-10-19 ENCOUNTER — Encounter (HOSPITAL_COMMUNITY)
Admission: EM | Disposition: A | Discharge status: Home / Self Care | Payer: Self-pay | Source: Home / Self Care | Attending: Internal Medicine

## 2015-10-19 ENCOUNTER — Observation Stay (HOSPITAL_COMMUNITY): Payer: Commercial Managed Care - PPO | Admitting: Anesthesiology

## 2015-10-19 ENCOUNTER — Encounter (HOSPITAL_COMMUNITY): Payer: Self-pay | Admitting: Emergency Medicine

## 2015-10-19 DIAGNOSIS — L03317 Cellulitis of buttock: Secondary | ICD-10-CM | POA: Diagnosis not present

## 2015-10-19 DIAGNOSIS — E871 Hypo-osmolality and hyponatremia: Secondary | ICD-10-CM | POA: Diagnosis present

## 2015-10-19 DIAGNOSIS — Z807 Family history of other malignant neoplasms of lymphoid, hematopoietic and related tissues: Secondary | ICD-10-CM

## 2015-10-19 DIAGNOSIS — M7989 Other specified soft tissue disorders: Secondary | ICD-10-CM

## 2015-10-19 DIAGNOSIS — M726 Necrotizing fasciitis: Secondary | ICD-10-CM | POA: Diagnosis not present

## 2015-10-19 DIAGNOSIS — Z6841 Body Mass Index (BMI) 40.0 and over, adult: Secondary | ICD-10-CM | POA: Diagnosis not present

## 2015-10-19 DIAGNOSIS — N179 Acute kidney failure, unspecified: Secondary | ICD-10-CM | POA: Diagnosis not present

## 2015-10-19 DIAGNOSIS — R34 Anuria and oliguria: Secondary | ICD-10-CM | POA: Diagnosis not present

## 2015-10-19 DIAGNOSIS — L0390 Cellulitis, unspecified: Secondary | ICD-10-CM | POA: Diagnosis present

## 2015-10-19 DIAGNOSIS — N170 Acute kidney failure with tubular necrosis: Secondary | ICD-10-CM | POA: Diagnosis not present

## 2015-10-19 DIAGNOSIS — E876 Hypokalemia: Secondary | ICD-10-CM | POA: Diagnosis present

## 2015-10-19 DIAGNOSIS — Y92230 Patient room in hospital as the place of occurrence of the external cause: Secondary | ICD-10-CM | POA: Diagnosis not present

## 2015-10-19 DIAGNOSIS — E872 Acidosis: Secondary | ICD-10-CM | POA: Diagnosis present

## 2015-10-19 DIAGNOSIS — E11628 Type 2 diabetes mellitus with other skin complications: Secondary | ICD-10-CM | POA: Diagnosis not present

## 2015-10-19 DIAGNOSIS — Z885 Allergy status to narcotic agent status: Secondary | ICD-10-CM | POA: Diagnosis not present

## 2015-10-19 DIAGNOSIS — F329 Major depressive disorder, single episode, unspecified: Secondary | ICD-10-CM | POA: Diagnosis present

## 2015-10-19 DIAGNOSIS — Z794 Long term (current) use of insulin: Secondary | ICD-10-CM

## 2015-10-19 DIAGNOSIS — I10 Essential (primary) hypertension: Secondary | ICD-10-CM | POA: Diagnosis present

## 2015-10-19 DIAGNOSIS — R6521 Severe sepsis with septic shock: Secondary | ICD-10-CM | POA: Diagnosis present

## 2015-10-19 DIAGNOSIS — Z833 Family history of diabetes mellitus: Secondary | ICD-10-CM

## 2015-10-19 DIAGNOSIS — A419 Sepsis, unspecified organism: Secondary | ICD-10-CM | POA: Diagnosis present

## 2015-10-19 DIAGNOSIS — E1365 Other specified diabetes mellitus with hyperglycemia: Secondary | ICD-10-CM | POA: Diagnosis not present

## 2015-10-19 DIAGNOSIS — E1165 Type 2 diabetes mellitus with hyperglycemia: Secondary | ICD-10-CM | POA: Diagnosis present

## 2015-10-19 DIAGNOSIS — Z79899 Other long term (current) drug therapy: Secondary | ICD-10-CM

## 2015-10-19 DIAGNOSIS — E119 Type 2 diabetes mellitus without complications: Secondary | ICD-10-CM

## 2015-10-19 DIAGNOSIS — T368X5A Adverse effect of other systemic antibiotics, initial encounter: Secondary | ICD-10-CM | POA: Diagnosis not present

## 2015-10-19 HISTORY — PX: IRRIGATION AND DEBRIDEMENT ABSCESS: SHX5252

## 2015-10-19 LAB — CBC WITH DIFFERENTIAL/PLATELET
BASOS PCT: 0 %
Basophils Absolute: 0 10*3/uL (ref 0.0–0.1)
EOS ABS: 0 10*3/uL (ref 0.0–0.7)
EOS PCT: 0 %
HCT: 30.4 % — ABNORMAL LOW (ref 36.0–46.0)
Hemoglobin: 10.8 g/dL — ABNORMAL LOW (ref 12.0–15.0)
LYMPHS PCT: 9 %
Lymphs Abs: 1.8 10*3/uL (ref 0.7–4.0)
MCH: 29.1 pg (ref 26.0–34.0)
MCHC: 35.5 g/dL (ref 30.0–36.0)
MCV: 81.9 fL (ref 78.0–100.0)
MONO ABS: 1.3 10*3/uL — AB (ref 0.1–1.0)
Monocytes Relative: 6 %
Neutro Abs: 17.7 10*3/uL — ABNORMAL HIGH (ref 1.7–7.7)
Neutrophils Relative %: 85 %
PLATELETS: 248 10*3/uL (ref 150–400)
RBC: 3.71 MIL/uL — AB (ref 3.87–5.11)
RDW: 12.5 % (ref 11.5–15.5)
WBC: 20.8 10*3/uL — AB (ref 4.0–10.5)

## 2015-10-19 LAB — I-STAT CG4 LACTIC ACID, ED: Lactic Acid, Venous: 1.47 mmol/L (ref 0.5–1.9)

## 2015-10-19 LAB — BASIC METABOLIC PANEL
Anion gap: 12 (ref 5–15)
BUN: 15 mg/dL (ref 6–20)
CALCIUM: 8.6 mg/dL — AB (ref 8.9–10.3)
CO2: 25 mmol/L (ref 22–32)
CREATININE: 0.77 mg/dL (ref 0.44–1.00)
Chloride: 95 mmol/L — ABNORMAL LOW (ref 101–111)
GFR calc Af Amer: >60 mL/min (ref >60)
GLUCOSE: 350 mg/dL — AB (ref 65–99)
POTASSIUM: 3.2 mmol/L — AB (ref 3.5–5.1)
SODIUM: 132 mmol/L — AB (ref 135–145)

## 2015-10-19 LAB — URINALYSIS, ROUTINE W REFLEX MICROSCOPIC
Hgb urine dipstick: NEGATIVE
Leukocytes, UA: NEGATIVE
NITRITE: NEGATIVE
PH: 6.5 (ref 5.0–8.0)
Protein, ur: 30 mg/dL — AB
SPECIFIC GRAVITY, URINE: 1.029 (ref 1.005–1.030)

## 2015-10-19 LAB — GLUCOSE, CAPILLARY
GLUCOSE-CAPILLARY: 300 mg/dL — AB (ref 65–99)
GLUCOSE-CAPILLARY: 284 mg/dL — AB (ref 65–99)
GLUCOSE-CAPILLARY: 291 mg/dL — AB (ref 65–99)
Glucose-Capillary: 296 mg/dL — ABNORMAL HIGH (ref 65–99)

## 2015-10-19 LAB — CBG MONITORING, ED: GLUCOSE-CAPILLARY: 366 mg/dL — AB (ref 65–99)

## 2015-10-19 LAB — URINE MICROSCOPIC-ADD ON: RBC / HPF: NONE SEEN RBC/hpf (ref 0–5)

## 2015-10-19 LAB — MRSA PCR SCREENING: MRSA BY PCR: POSITIVE — AB

## 2015-10-19 SURGERY — IRRIGATION AND DEBRIDEMENT ABSCESS
Anesthesia: General

## 2015-10-19 MED ORDER — CHLORHEXIDINE GLUCONATE CLOTH 2 % EX PADS
6.0000 | MEDICATED_PAD | Freq: Every day | CUTANEOUS | Status: AC
  Administered 2015-10-20 – 2015-10-24 (×5): 6 via TOPICAL

## 2015-10-19 MED ORDER — PROPOFOL 10 MG/ML IV BOLUS
INTRAVENOUS | Status: AC
Start: 2015-10-19 — End: 2015-10-19
  Filled 2015-10-19: qty 20

## 2015-10-19 MED ORDER — MEPERIDINE HCL 50 MG/ML IJ SOLN: 6.2500 mg | INTRAMUSCULAR | Status: DC | PRN

## 2015-10-19 MED ORDER — ONDANSETRON 4 MG PO TBDP
4.0000 mg | ORAL_TABLET | Freq: Four times a day (QID) | ORAL | Status: DC | PRN
  Administered 2015-10-20 – 2015-10-29 (×14): 4 mg via ORAL
  Filled 2015-10-19 (×14): qty 1

## 2015-10-19 MED ORDER — BUPROPION HCL ER (XL) 150 MG PO TB24
150.0000 mg | ORAL_TABLET | Freq: Every day | ORAL | Status: DC
  Administered 2015-10-19 – 2015-10-26 (×3): 150 mg via ORAL
  Filled 2015-10-19 (×11): qty 1

## 2015-10-19 MED ORDER — HYDROMORPHONE HCL 1 MG/ML IJ SOLN
0.2500 mg | INTRAMUSCULAR | Status: DC | PRN
Start: 2015-10-19 — End: 2015-10-19
  Administered 2015-10-19: 0.5 mg via INTRAVENOUS

## 2015-10-19 MED ORDER — POTASSIUM CHLORIDE IN NACL 20-0.9 MEQ/L-% IV SOLN
INTRAVENOUS | Status: DC
  Administered 2015-10-19: 16:00:00 via INTRAVENOUS
  Filled 2015-10-19 (×3): qty 1000

## 2015-10-19 MED ORDER — SODIUM CHLORIDE 0.9% FLUSH
3.0000 mL | Freq: Two times a day (BID) | INTRAVENOUS | Status: DC
  Administered 2015-10-19 – 2015-11-03 (×18): 3 mL via INTRAVENOUS

## 2015-10-19 MED ORDER — ROCURONIUM BROMIDE 10 MG/ML (PF) SYRINGE
PREFILLED_SYRINGE | INTRAVENOUS | Status: AC
Start: 2015-10-19 — End: 2015-10-19
  Filled 2015-10-19: qty 10

## 2015-10-19 MED ORDER — PIPERACILLIN-TAZOBACTAM 3.375 G IVPB 30 MIN
3.3750 g | INTRAVENOUS | Status: AC
  Administered 2015-10-19: 3.375 g via INTRAVENOUS
  Filled 2015-10-19: qty 50

## 2015-10-19 MED ORDER — FUROSEMIDE 20 MG PO TABS
20.0000 mg | ORAL_TABLET | Freq: Every day | ORAL | Status: DC
  Filled 2015-10-19 (×3): qty 1

## 2015-10-19 MED ORDER — SENNA 8.6 MG PO TABS
2.0000 | ORAL_TABLET | Freq: Two times a day (BID) | ORAL | Status: DC
  Administered 2015-10-19 – 2015-11-02 (×10): 17.2 mg via ORAL
  Filled 2015-10-19 (×24): qty 2

## 2015-10-19 MED ORDER — ENOXAPARIN SODIUM 40 MG/0.4ML ~~LOC~~ SOLN
40.0000 mg | SUBCUTANEOUS | Status: DC
Start: 2015-10-19 — End: 2015-10-19

## 2015-10-19 MED ORDER — HYDROMORPHONE HCL 1 MG/ML IJ SOLN
INTRAMUSCULAR | Status: AC
  Filled 2015-10-19: qty 1

## 2015-10-19 MED ORDER — SERTRALINE HCL 50 MG PO TABS
25.0000 mg | ORAL_TABLET | Freq: Every day | ORAL | Status: DC
  Administered 2015-10-19 – 2015-10-26 (×3): 25 mg via ORAL
  Filled 2015-10-19 (×11): qty 1

## 2015-10-19 MED ORDER — LACTATED RINGERS IV SOLN: INTRAVENOUS | Status: DC

## 2015-10-19 MED ORDER — INSULIN ASPART 100 UNIT/ML ~~LOC~~ SOLN
4.0000 [IU] | Freq: Once | SUBCUTANEOUS | Status: AC
  Administered 2015-10-19: 4 [IU] via SUBCUTANEOUS

## 2015-10-19 MED ORDER — INSULIN ASPART 100 UNIT/ML ~~LOC~~ SOLN
5.0000 [IU] | Freq: Once | SUBCUTANEOUS | Status: AC
  Administered 2015-10-19: 5 [IU] via SUBCUTANEOUS

## 2015-10-19 MED ORDER — INSULIN ASPART 100 UNIT/ML ~~LOC~~ SOLN
0.0000 [IU] | SUBCUTANEOUS | Status: DC
  Administered 2015-10-19: 11 [IU] via SUBCUTANEOUS

## 2015-10-19 MED ORDER — HYDROMORPHONE HCL 1 MG/ML IJ SOLN
1.0000 mg | Freq: Once | INTRAMUSCULAR | Status: DC
  Filled 2015-10-19: qty 1

## 2015-10-19 MED ORDER — MUPIROCIN 2 % EX OINT
1.0000 "application " | TOPICAL_OINTMENT | Freq: Two times a day (BID) | CUTANEOUS | Status: AC
  Administered 2015-10-19 – 2015-10-24 (×9): 1 via NASAL
  Filled 2015-10-19 (×2): qty 22

## 2015-10-19 MED ORDER — HYDROMORPHONE HCL 1 MG/ML IJ SOLN
1.0000 mg | INTRAMUSCULAR | Status: DC | PRN
  Filled 2015-10-19 (×3): qty 1

## 2015-10-19 MED ORDER — ONDANSETRON HCL 4 MG/2ML IJ SOLN
4.0000 mg | Freq: Four times a day (QID) | INTRAMUSCULAR | Status: DC | PRN
  Administered 2015-10-21 – 2015-10-24 (×2): 4 mg via INTRAVENOUS
  Filled 2015-10-19 (×2): qty 2

## 2015-10-19 MED ORDER — ENOXAPARIN SODIUM 40 MG/0.4ML ~~LOC~~ SOLN
40.0000 mg | SUBCUTANEOUS | Status: DC
  Administered 2015-10-20: 40 mg via SUBCUTANEOUS
  Filled 2015-10-19: qty 0.4

## 2015-10-19 MED ORDER — SODIUM CHLORIDE 0.9 % IV BOLUS (SEPSIS)
1000.0000 mL | Freq: Once | INTRAVENOUS | Status: AC
  Administered 2015-10-19: 1000 mL via INTRAVENOUS

## 2015-10-19 MED ORDER — INSULIN ASPART 100 UNIT/ML ~~LOC~~ SOLN
SUBCUTANEOUS | Status: AC
  Filled 2015-10-19: qty 1

## 2015-10-19 MED ORDER — METOCLOPRAMIDE HCL 5 MG/ML IJ SOLN: 10.0000 mg | Freq: Once | INTRAMUSCULAR | Status: DC | PRN

## 2015-10-19 MED ORDER — INSULIN GLARGINE 100 UNIT/ML ~~LOC~~ SOLN
15.0000 [IU] | Freq: Every day | SUBCUTANEOUS | Status: DC
  Administered 2015-10-19: 15 [IU] via SUBCUTANEOUS
  Filled 2015-10-19 (×2): qty 0.15

## 2015-10-19 MED ORDER — PROMETHAZINE HCL 25 MG/ML IJ SOLN
INTRAMUSCULAR | Status: AC
  Filled 2015-10-19: qty 1

## 2015-10-19 MED ORDER — INSULIN ASPART 100 UNIT/ML ~~LOC~~ SOLN
0.0000 [IU] | Freq: Every day | SUBCUTANEOUS | Status: DC
  Administered 2015-10-19: 3 [IU] via SUBCUTANEOUS

## 2015-10-19 MED ORDER — OXYCODONE HCL 5 MG PO TABS
5.0000 mg | ORAL_TABLET | ORAL | Status: DC | PRN
  Administered 2015-10-20 – 2015-11-03 (×25): 10 mg via ORAL
  Filled 2015-10-19 (×25): qty 2

## 2015-10-19 MED ORDER — LACTATED RINGERS IV SOLN
INTRAVENOUS | Status: DC | PRN
  Administered 2015-10-19: 13:00:00 via INTRAVENOUS

## 2015-10-19 MED ORDER — FENTANYL CITRATE (PF) 100 MCG/2ML IJ SOLN
INTRAMUSCULAR | Status: AC
  Filled 2015-10-19: qty 2

## 2015-10-19 MED ORDER — ALBUTEROL SULFATE HFA 108 (90 BASE) MCG/ACT IN AERS: 2.0000 | INHALATION_SPRAY | Freq: Four times a day (QID) | RESPIRATORY_TRACT | Status: DC | PRN

## 2015-10-19 MED ORDER — PROPOFOL 10 MG/ML IV BOLUS
INTRAVENOUS | Status: AC
  Filled 2015-10-19: qty 20

## 2015-10-19 MED ORDER — LIDOCAINE 2% (20 MG/ML) 5 ML SYRINGE
INTRAMUSCULAR | Status: DC | PRN
  Administered 2015-10-19: 100 mg via INTRAVENOUS

## 2015-10-19 MED ORDER — ROCURONIUM BROMIDE 10 MG/ML (PF) SYRINGE
PREFILLED_SYRINGE | INTRAVENOUS | Status: DC | PRN
  Administered 2015-10-19: 10 mg via INTRAVENOUS

## 2015-10-19 MED ORDER — 0.9 % SODIUM CHLORIDE (POUR BTL) OPTIME
TOPICAL | Status: DC | PRN
  Administered 2015-10-19: 1000 mL

## 2015-10-19 MED ORDER — ALBUTEROL SULFATE (2.5 MG/3ML) 0.083% IN NEBU
2.5000 mg | INHALATION_SOLUTION | Freq: Four times a day (QID) | RESPIRATORY_TRACT | Status: DC | PRN
Start: 2015-10-19 — End: 2015-11-03

## 2015-10-19 MED ORDER — INSULIN GLARGINE 100 UNIT/ML ~~LOC~~ SOLN
10.0000 [IU] | Freq: Every day | SUBCUTANEOUS | Status: DC
Start: 2015-10-19 — End: 2015-10-19
  Filled 2015-10-19: qty 0.1

## 2015-10-19 MED ORDER — ONDANSETRON HCL 4 MG/2ML IJ SOLN: 4.0000 mg | Freq: Four times a day (QID) | INTRAMUSCULAR | Status: DC | PRN

## 2015-10-19 MED ORDER — INSULIN ASPART 100 UNIT/ML ~~LOC~~ SOLN
0.0000 [IU] | Freq: Three times a day (TID) | SUBCUTANEOUS | Status: DC
  Administered 2015-10-20: 4 [IU] via SUBCUTANEOUS
  Administered 2015-10-20: 11 [IU] via SUBCUTANEOUS
  Administered 2015-10-20: 15 [IU] via SUBCUTANEOUS
  Administered 2015-10-21 (×2): 3 [IU] via SUBCUTANEOUS
  Administered 2015-10-22: 4 [IU] via SUBCUTANEOUS
  Administered 2015-10-22 – 2015-10-23 (×3): 3 [IU] via SUBCUTANEOUS
  Administered 2015-10-24 (×3): 4 [IU] via SUBCUTANEOUS
  Administered 2015-10-26 – 2015-10-29 (×3): 3 [IU] via SUBCUTANEOUS
  Administered 2015-10-31: 4 [IU] via SUBCUTANEOUS
  Administered 2015-10-31 (×2): 3 [IU] via SUBCUTANEOUS
  Administered 2015-11-01: 4 [IU] via SUBCUTANEOUS
  Administered 2015-11-02 – 2015-11-03 (×2): 3 [IU] via SUBCUTANEOUS

## 2015-10-19 MED ORDER — FENTANYL CITRATE (PF) 100 MCG/2ML IJ SOLN
25.0000 ug | INTRAMUSCULAR | Status: DC | PRN
  Administered 2015-10-19 (×2): 50 ug via INTRAVENOUS

## 2015-10-19 MED ORDER — ONDANSETRON HCL 4 MG/2ML IJ SOLN
INTRAMUSCULAR | Status: AC
  Filled 2015-10-19: qty 2

## 2015-10-19 MED ORDER — INSULIN ASPART 100 UNIT/ML ~~LOC~~ SOLN
8.0000 [IU] | Freq: Three times a day (TID) | SUBCUTANEOUS | Status: DC
Start: 2015-10-19 — End: 2015-10-20
  Administered 2015-10-19 – 2015-10-20 (×3): 8 [IU] via SUBCUTANEOUS

## 2015-10-19 MED ORDER — LIDOCAINE 2% (20 MG/ML) 5 ML SYRINGE
INTRAMUSCULAR | Status: AC
  Filled 2015-10-19: qty 5

## 2015-10-19 MED ORDER — ONDANSETRON HCL 4 MG/2ML IJ SOLN
4.0000 mg | Freq: Once | INTRAMUSCULAR | Status: AC
  Administered 2015-10-19: 4 mg via INTRAVENOUS
  Filled 2015-10-19: qty 2

## 2015-10-19 MED ORDER — FENTANYL CITRATE (PF) 100 MCG/2ML IJ SOLN
INTRAMUSCULAR | Status: DC | PRN
  Administered 2015-10-19: 100 ug via INTRAVENOUS
  Administered 2015-10-19 (×2): 50 ug via INTRAVENOUS

## 2015-10-19 MED ORDER — SENNOSIDES-DOCUSATE SODIUM 8.6-50 MG PO TABS: 4.0000 | ORAL_TABLET | Freq: Every day | ORAL | Status: DC | PRN

## 2015-10-19 MED ORDER — SUGAMMADEX SODIUM 500 MG/5ML IV SOLN
INTRAVENOUS | Status: AC
  Filled 2015-10-19: qty 5

## 2015-10-19 MED ORDER — PANTOPRAZOLE SODIUM 40 MG PO TBEC
40.0000 mg | DELAYED_RELEASE_TABLET | Freq: Two times a day (BID) | ORAL | Status: DC
  Administered 2015-10-19 – 2015-10-30 (×21): 40 mg via ORAL
  Filled 2015-10-19 (×21): qty 1

## 2015-10-19 MED ORDER — SUCCINYLCHOLINE CHLORIDE 200 MG/10ML IV SOSY
PREFILLED_SYRINGE | INTRAVENOUS | Status: DC | PRN
  Administered 2015-10-19: 160 mg via INTRAVENOUS

## 2015-10-19 MED ORDER — ENOXAPARIN SODIUM 40 MG/0.4ML ~~LOC~~ SOLN: 40.0000 mg | SUBCUTANEOUS | Status: DC

## 2015-10-19 MED ORDER — PROPOFOL 10 MG/ML IV BOLUS
INTRAVENOUS | Status: DC | PRN
  Administered 2015-10-19: 200 mg via INTRAVENOUS

## 2015-10-19 MED ORDER — ENOXAPARIN SODIUM 80 MG/0.8ML ~~LOC~~ SOLN: 70.0000 mg | SUBCUTANEOUS | Status: DC

## 2015-10-19 MED ORDER — SUGAMMADEX SODIUM 200 MG/2ML IV SOLN
INTRAVENOUS | Status: DC | PRN
Start: 2015-10-19 — End: 2015-10-19
  Administered 2015-10-19: 200 mg via INTRAVENOUS

## 2015-10-19 MED ORDER — ONDANSETRON HCL 4 MG/2ML IJ SOLN
INTRAMUSCULAR | Status: DC | PRN
  Administered 2015-10-19: 4 mg via INTRAVENOUS

## 2015-10-19 MED ORDER — HYDROCODONE-ACETAMINOPHEN 5-325 MG PO TABS
1.0000 | ORAL_TABLET | ORAL | Status: DC | PRN
  Administered 2015-10-19: 2 via ORAL
  Filled 2015-10-19: qty 2

## 2015-10-19 MED ORDER — PROMETHAZINE HCL 25 MG/ML IJ SOLN
6.2500 mg | INTRAMUSCULAR | Status: DC | PRN
  Administered 2015-10-19: 6.25 mg via INTRAVENOUS

## 2015-10-19 MED ORDER — VANCOMYCIN HCL 10 G IV SOLR
1250.0000 mg | Freq: Three times a day (TID) | INTRAVENOUS | Status: DC
  Administered 2015-10-19 – 2015-10-20 (×3): 1250 mg via INTRAVENOUS
  Filled 2015-10-19 (×8): qty 1250

## 2015-10-19 MED ORDER — VANCOMYCIN HCL 10 G IV SOLR
2500.0000 mg | Freq: Once | INTRAVENOUS | Status: AC
  Administered 2015-10-19: 2500 mg via INTRAVENOUS
  Filled 2015-10-19: qty 2000

## 2015-10-19 MED ORDER — PIPERACILLIN-TAZOBACTAM 3.375 G IVPB
3.3750 g | Freq: Three times a day (TID) | INTRAVENOUS | Status: DC
  Administered 2015-10-19 – 2015-10-21 (×5): 3.375 g via INTRAVENOUS
  Filled 2015-10-19 (×10): qty 50

## 2015-10-19 SURGICAL SUPPLY — 44 items
BENZOIN TINCTURE PRP APPL 2/3 (GAUZE/BANDAGES/DRESSINGS) IMPLANT
BLADE HEX COATED 2.75 (ELECTRODE) IMPLANT
BLADE SURG 15 STRL LF DISP TIS (BLADE) IMPLANT
BLADE SURG 15 STRL SS (BLADE)
BLADE SURG SZ10 CARB STEEL (BLADE) IMPLANT
BNDG GAUZE ELAST 4 BULKY (GAUZE/BANDAGES/DRESSINGS) ×6 IMPLANT
COVER SURGICAL LIGHT HANDLE (MISCELLANEOUS) IMPLANT
DECANTER SPIKE VIAL GLASS SM (MISCELLANEOUS) IMPLANT
DRAPE LAPAROSCOPIC ABDOMINAL (DRAPES) IMPLANT
DRAPE LAPAROTOMY T 102X78X121 (DRAPES) ×2 IMPLANT
DRAPE LAPAROTOMY TRNSV 102X78 (DRAPE) IMPLANT
DRAPE POUCH INSTRU U-SHP 10X18 (DRAPES) IMPLANT
DRAPE SHEET LG 3/4 BI-LAMINATE (DRAPES) IMPLANT
ELECT PENCIL ROCKER SW 15FT (MISCELLANEOUS) IMPLANT
ELECT REM PT RETURN 9FT ADLT (ELECTROSURGICAL) ×2
ELECTRODE REM PT RTRN 9FT ADLT (ELECTROSURGICAL) ×1 IMPLANT
EVACUATOR SILICONE 100CC (DRAIN) IMPLANT
GAUZE SPONGE 4X4 12PLY STRL (GAUZE/BANDAGES/DRESSINGS) ×4 IMPLANT
GLOVE BIOGEL PI IND STRL 7.0 (GLOVE) ×1 IMPLANT
GLOVE BIOGEL PI INDICATOR 7.0 (GLOVE) ×1
GLOVE EUDERMIC 7 POWDERFREE (GLOVE) ×2 IMPLANT
GOWN STRL REUS W/TWL LRG LVL3 (GOWN DISPOSABLE) IMPLANT
GOWN STRL REUS W/TWL XL LVL3 (GOWN DISPOSABLE) ×4 IMPLANT
KIT BASIN OR (CUSTOM PROCEDURE TRAY) ×2 IMPLANT
MARKER SKIN DUAL TIP RULER LAB (MISCELLANEOUS) ×2 IMPLANT
NEEDLE HYPO 25X1 1.5 SAFETY (NEEDLE) IMPLANT
NS IRRIG 1000ML POUR BTL (IV SOLUTION) ×2 IMPLANT
PACK BASIC VI WITH GOWN DISP (CUSTOM PROCEDURE TRAY) IMPLANT
PACK GENERAL/GYN (CUSTOM PROCEDURE TRAY) ×2 IMPLANT
PAD ABD 8X10 STRL (GAUZE/BANDAGES/DRESSINGS) ×4 IMPLANT
SOL PREP POV-IOD 4OZ 10% (MISCELLANEOUS) ×2 IMPLANT
SPONGE LAP 18X18 X RAY DECT (DISPOSABLE) ×6 IMPLANT
SPONGE LAP 4X18 X RAY DECT (DISPOSABLE) IMPLANT
STAPLER VISISTAT 35W (STAPLE) IMPLANT
STRIP CLOSURE SKIN 1/2X4 (GAUZE/BANDAGES/DRESSINGS) IMPLANT
SUT MNCRL AB 4-0 PS2 18 (SUTURE) IMPLANT
SUT VIC AB 3-0 SH 18 (SUTURE) IMPLANT
SWAB COLLECTION DEVICE MRSA (MISCELLANEOUS) ×2 IMPLANT
SWAB CULTURE ESWAB REG 1ML (MISCELLANEOUS) ×2 IMPLANT
SYR CONTROL 10ML LL (SYRINGE) IMPLANT
TAPE CLOTH SURG 6X10 WHT LF (GAUZE/BANDAGES/DRESSINGS) ×2 IMPLANT
TOWEL OR 17X26 10 PK STRL BLUE (TOWEL DISPOSABLE) ×2 IMPLANT
WATER STERILE IRR 1500ML POUR (IV SOLUTION) IMPLANT
YANKAUER SUCT BULB TIP 10FT TU (MISCELLANEOUS) IMPLANT

## 2015-10-19 NOTE — Consult Note (Signed)
[facility] Surgery Consult Note  PAIDYN MCFERRAN May 12, 1970  [id number].    Requesting MD: Dr. Eliseo Squires Chief Complaint/Reason for Consult: cellulitis of buttock HPI:  45 y/o female with PMH diabetes mellitus (uncontrolled) and morbid obesity. Today is her third visit to the ED for the same chief complaint: "tailbone pain". She was first seen on 9/8 and a CT scan did not show a drainable fluid collection, online questionable soft tissue swelling around vagina/cervix. Patient was discharged on Keflex. Patient returned again on 9/12 with worsening pain and her antibiotic regimen was switched to Bactrim. Patient presented today with worsening pain of her tail bone. States the area has been draining clear fluid. Associated symptoms include fevers, chills, body aches, and elevated blood glucose levels. She has not had a BM in 4 days.  She denies a prior history of hypertension, MI, or Stroke. She is allergic to codeine. She denies the use of blood thinning medications. Her last meal was dinner yesterday.  Past surgeries: tubal ligation Denies HA, CP, SOB, weakness, fatigue.  Ms. Eilert lives in [place] with her friend and her friends daughter. She works as a Quarry manager at [employer].  ROS: All systems reviewed and otherwise negative except for as above  Family History  Problem Relation Age of Onset  . Diabetes Mother   . Lymphoma Father   . Lymphoma Brother     Past Medical History:  Diagnosis Date  . Allergy   . Depression   . Diabetes mellitus   . Morbid obesity ([place])     Past Surgical History:  Procedure Laterality Date  . TUBAL LIGATION      Social History:  reports that she has never smoked. She has never used smokeless tobacco. She reports that she does not drink alcohol or use drugs.  Allergies:  Allergies  Allergen Reactions  . Codeine Shortness Of Breath     (Not in a hospital admission)  Blood pressure (!) 102/47, pulse 74, temperature 98.4 F  (36.9 C), temperature source Oral, resp. rate 18, last menstrual period 09/20/2015, SpO2 100 %. Physical Exam: General: pleasant, morbidly obese AA female who is laying in bed in NAD HEENT: head is normocephalic, atraumatic. Mouth is pink and moist. Vesicular lesions over right upper and lower lip. Heart: regular, rate, and rhythm.  No obvious murmurs, gallops, or rubs noted.  Palpable pedal pulses bilaterally Lungs: CTAB, no wheezes, rhonchi, or rales noted.  Respiratory effort nonlabored Abd: soft, NT/ND, +BS, no masses, hernias, or organomegaly MS: all 4 extremities are symmetrical with no cyanosis, clubbing, or edema. Skin: pre-sacral abscess with ~3 cm of skin breakdown over sacrum. ~15 cm of erythema and induration appreciated - tracking inferiorly from sacrum over left buttock. Some induration over superior aspect of right buttock. There is no perirectal fluid collection, induration, erythema, or drainage. Psych: A&Ox3 with an appropriate affect. Neuro: normal speech  Results for orders placed or performed during the hospital encounter of 10/19/15 (from the past 48 hour(s))  Basic metabolic panel     Status: Abnormal   Collection Time: 10/19/15  9:10 AM  Result Value Ref Range   Sodium 132 (L) 135 - 145 mmol/L   Potassium 3.2 (L) 3.5 - 5.1 mmol/L   Chloride 95 (L) 101 - 111 mmol/L   CO2 25 22 - 32 mmol/L   Glucose, Bld 350 (H) 65 - 99 mg/dL   BUN 15 6 - 20 mg/dL   Creatinine, Ser 0.77 0.44 - 1.00 mg/dL  Calcium 8.6 (L) 8.9 - 10.3 mg/dL   GFR calc non Af Amer >60 >60 mL/min   GFR calc Af Amer >60 >60 mL/min    Comment: (NOTE) The eGFR has been calculated using the CKD EPI equation. This calculation has not been validated in all clinical situations. eGFR's persistently <60 mL/min signify possible Chronic Kidney Disease.    Anion gap 12 5 - 15  CBC with Differential     Status: Abnormal   Collection Time: 10/19/15  9:10 AM  Result Value Ref Range   WBC 20.8 (H) 4.0 - 10.5  K/uL   RBC 3.71 (L) 3.87 - 5.11 MIL/uL   Hemoglobin 10.8 (L) 12.0 - 15.0 g/dL   HCT 30.4 (L) 36.0 - 46.0 %   MCV 81.9 78.0 - 100.0 fL   MCH 29.1 26.0 - 34.0 pg   MCHC 35.5 30.0 - 36.0 g/dL   RDW 12.5 11.5 - 15.5 %   Platelets 248 150 - 400 K/uL   Neutrophils Relative % 85 %   Neutro Abs 17.7 (H) 1.7 - 7.7 K/uL   Lymphocytes Relative 9 %   Lymphs Abs 1.8 0.7 - 4.0 K/uL   Monocytes Relative 6 %   Monocytes Absolute 1.3 (H) 0.1 - 1.0 K/uL   Eosinophils Relative 0 %   Eosinophils Absolute 0.0 0.0 - 0.7 K/uL   Basophils Relative 0 %   Basophils Absolute 0.0 0.0 - 0.1 K/uL  POC CBG, ED     Status: Abnormal   Collection Time: 10/19/15  9:12 AM  Result Value Ref Range   Glucose-Capillary 366 (H) 65 - 99 mg/dL  I-Stat CG4 Lactic Acid, ED     Status: None   Collection Time: 10/19/15  9:38 AM  Result Value Ref Range   Lactic Acid, Venous 1.47 0.5 - 1.9 mmol/L   No results found.  Assessment/Plan Cellulitis of buttock Pre-sacral abscess -I&D in OR 10/19/15  Dr. Fanny Skates - wound cultures   Leukocytosis - WBC 20.8, started on Vanc/Zosyn Diabetes mellitus, uncontrolled Morbid Obesity   FEN: NPO/IVF ID: Vanc/Zosyn DVT Proph: SCD's  Plan: Incision and drainage today by Dr. Dalbert Batman in the OR.    Jill Alexanders, [facility] Surgery 10/19/2015, 12:02 PM Pager: [phone] Consults: [phone] Mon-Fri 7:00 am-4:30 pm Sat-Sun 7:00 am-11:30 am

## 2015-10-19 NOTE — ED Notes (Signed)
Patient is a diabetic and reports she has not been able to keep her blood sugars under control in the past.  They are running in the 200's now.  Patient has not checked her blood sugar today.  Patient has not had anything to eat or drink today.  She took a shower and came straight to the ED.  Patient cannot keep the drainage under control.

## 2015-10-19 NOTE — ED Notes (Signed)
Patient tried however was unable to void

## 2015-10-19 NOTE — Transfer of Care (Signed)
Immediate Anesthesia Transfer of Care Note  Patient: [name]  Procedure(s) Performed: Procedure(s): IRRIGATION AND DEBRIDEMENT PRESACRAL ABSCESS (N/A)  Patient Location: PACU  Anesthesia Type:General  Level of Consciousness: awake and patient cooperative  Airway & Oxygen Therapy: Patient Spontanous Breathing and Patient connected to face mask oxygen  Post-op Assessment: Report given to RN and Post -op Vital signs reviewed and stable  Post vital signs: Reviewed and stable  Last Vitals:  Vitals:   10/19/15 0756 10/19/15 1134  BP: 142/85 (!) 102/47  Pulse: 100 74  Resp: 17 18  Temp: 36.9 C     Last Pain:  Vitals:   10/19/15 1032  TempSrc:   PainSc: 7          Complications: No apparent anesthesia complications

## 2015-10-19 NOTE — H&P (Signed)
History and Physical    [name]:[id number] DOB: Jan 20, 1971 DOA: 10/19/2015  Referring MD/NP/PA: ER PCP: Verlon Au, MD Outpatient Specialists:  Patient coming from: home  Chief Complaint: Pain and worsening infection  HPI: [name] is a 45 y.o. female with medical history significant of poorly controlled diabetes and morbid obesity.  Patient has been seen in the ER 3 times in the last 2 weeks the first time was September 8 CT scan was done that did not show a perirectal abscess but did show a questionable soft tissue prominence around the cervix and vagina.  Patient was sent home on Keflex at that time she was compliant with her medications. Patient was again seen on 10/17/2015 by Dr. Adriana Simas and antibiotic was changed to Bactrim.  Patient states the last 2 days she has worsened.  Area has been draining and pain has increased patient states she's had fevers and chills and blood sugars have been worse than usual.    ED Course: In the ER patient was found to have a worsening cellulitis with increased white blood cell count from normal to 20.  Patient had blood cultures drawn and started on vancomycin and Zosyn  Review of Systems: all systems reviewed, negative unless stated above in HPI   Past Medical History:  Diagnosis Date  . Allergy   . Depression   . Diabetes mellitus   . Morbid obesity (HCC)     Past Surgical History:  Procedure Laterality Date  . TUBAL LIGATION       reports that she has never smoked. She has never used smokeless tobacco. She reports that she does not drink alcohol or use drugs.  Allergies  Allergen Reactions  . Codeine Shortness Of Breath    Family History  Problem Relation Age of Onset  . Diabetes Mother   . Lymphoma Father   . Lymphoma Brother    Prior to Admission medications   Medication Sig Start Date End Date Taking? Authorizing Provider  albuterol (VENTOLIN HFA) 108 (90 Base) MCG/ACT inhaler Inhale 2 puffs  into the lungs every 6 (six) hours as needed for wheezing or shortness of breath.  06/15/15 06/14/16  Historical Provider, MD  buPROPion (WELLBUTRIN XL) 150 MG 24 hr tablet Take 150 mg by mouth daily. 06/15/15 06/14/16  Historical Provider, MD  cephALEXin (KEFLEX) 500 MG capsule Take 1 capsule (500 mg total) by mouth 4 (four) times daily. 10/13/15   Samantha Tripp Dowless, PA-C  Fexofenadine HCl (ALLEGRA PO) Take 1 tablet by mouth daily as needed (allergies.).    Historical Provider, MD  furosemide (LASIX) 20 MG tablet Take 1 tablet (20 mg total) by mouth daily. 03/16/15 04/06/15  Silas Flood, MD  ibuprofen (ADVIL,MOTRIN) 200 MG tablet Take 800 mg by mouth at bedtime as needed for moderate pain.     Historical Provider, MD  ibuprofen (ADVIL,MOTRIN) 800 MG tablet Take 1 tablet (800 mg total) by mouth 3 (three) times daily. 10/13/15   Samantha Tripp Dowless, PA-C  insulin aspart (NOVOLOG) 100 UNIT/ML injection Inject 8 Units into the skin 3 (three) times daily with meals.  10/02/15   Historical Provider, MD  Insulin Glargine (LANTUS SOLOSTAR) 100 UNIT/ML Solostar Pen Inject 15 Units into the skin at bedtime.  10/02/15   Historical Provider, MD  insulin regular (NOVOLIN R) 100 units/mL injection Inject 0.05 mLs (5 Units total) into the skin 3 (three) times daily before meals. As needed for glucose over 250 09/19/13   Layla Maw Ward,  DO  metFORMIN (GLUCOPHAGE) 500 MG tablet TAKE ONE TABLET BY MOUTH TWICE DAILY WITH MEALS 01/31/14   Tiffany L Reed, DO  metFORMIN (GLUCOPHAGE) 500 MG tablet Take 1,000 mg by mouth 2 (two) times daily. 06/15/15   Historical Provider, MD  naproxen sodium (ANAPROX) 220 MG tablet Take 440 mg by mouth at bedtime as needed (for pain).    Historical Provider, MD  ondansetron (ZOFRAN) 8 MG tablet Take 1 tablet (8 mg total) by mouth every 8 (eight) hours as needed for nausea or vomiting. 10/17/15   Donnetta Hutching, MD  sertraline (ZOLOFT) 25 MG tablet Take 25 mg by mouth daily. 06/15/15 06/14/16  Historical  Provider, MD  sulfamethoxazole-trimethoprim (BACTRIM DS,SEPTRA DS) 800-160 MG tablet Take 1 tablet by mouth 2 (two) times daily. 10/17/15 10/24/15  Donnetta Hutching, MD  traMADol (ULTRAM) 50 MG tablet Take 1 tablet (50 mg total) by mouth 2 (two) times daily as needed. 10/17/15   Donnetta Hutching, MD    Physical Exam: Vitals:   10/19/15 0756 10/19/15 1134  BP: 142/85 (!) 102/47  Pulse: 100 74  Resp: 17 18  Temp: 98.4 F (36.9 C)   TempSrc: Oral   SpO2: 100% 100%      Constitutional: uncomfortable appearing Vitals:   10/19/15 0756 10/19/15 1134  BP: 142/85 (!) 102/47  Pulse: 100 74  Resp: 17 18  Temp: 98.4 F (36.9 C)   TempSrc: Oral   SpO2: 100% 100%   Eyes: PERRL, lids and conjunctivae normal ENMT: Mucous membranes are moist. Posterior pharynx clear of any exudate or lesions.Normal dentition.  Neck: normal, supple, no masses, no thyromegaly Respiratory: clear to auscultation bilaterally, no wheezing, no crackles. Normal respiratory effort. No accessory muscle use.  Cardiovascular: Regular rate and rhythm, no murmurs / rubs / gallops. No extremity edema. 2+ pedal pulses. No carotid bruits.  Abdomen: obese, no tenderness, no masses palpated. No hepatosplenomegaly. Bowel sounds positive.  Musculoskeletal: no clubbing / cyanosis. No joint deformity upper and lower extremities. Good ROM, no contractures. Normal muscle tone.  Skin: firm area on right gluteal ceft, mid upper gluteal cleft with draining open wound Neurologic: CN 2-12 grossly intact. Sensation intact, DTR normal. Strength 5/5 in all 4.  Psychiatric: Normal judgment and insight. Alert and oriented x 3. Normal mood.    Labs on Admission: I have personally reviewed following labs and imaging studies  CBC:  Recent Labs Lab 10/13/15 1554 10/19/15 0910  WBC 8.5 20.8*  NEUTROABS 6.4 17.7*  HGB 12.1 10.8*  HCT 35.5* 30.4*  MCV 86.2 81.9  PLT 206 248   Basic Metabolic Panel:  Recent Labs Lab 10/13/15 1554 10/19/15 0910    NA 135 132*  K 3.9 3.2*  CL 103 95*  CO2 24 25  GLUCOSE 106* 350*  BUN 8 15  CREATININE 0.61 0.77  CALCIUM 9.3 8.6*   GFR: Estimated Creatinine Clearance: 129.3 mL/min (by C-G formula based on SCr of 0.77 mg/dL). Liver Function Tests:  Recent Labs Lab 10/13/15 1554  AST 33  ALT 25  ALKPHOS 123  BILITOT 0.7  PROT 7.5  ALBUMIN 3.1*   No results for input(s): LIPASE, AMYLASE in the last 168 hours. No results for input(s): AMMONIA in the last 168 hours. Coagulation Profile: No results for input(s): INR, PROTIME in the last 168 hours. Cardiac Enzymes: No results for input(s): CKTOTAL, CKMB, CKMBINDEX, TROPONINI in the last 168 hours. BNP (last 3 results) No results for input(s): PROBNP in the last 8760 hours. HbA1C: No results  for input(s): HGBA1C in the last 72 hours. CBG:  Recent Labs Lab 10/19/15 0912  GLUCAP 366*   Lipid Profile: No results for input(s): CHOL, HDL, LDLCALC, TRIG, CHOLHDL, LDLDIRECT in the last 72 hours. Thyroid Function Tests: No results for input(s): TSH, T4TOTAL, FREET4, T3FREE, THYROIDAB in the last 72 hours. Anemia Panel: No results for input(s): VITAMINB12, FOLATE, FERRITIN, TIBC, IRON, RETICCTPCT in the last 72 hours. Urine analysis:    Component Value Date/Time   COLORURINE YELLOW 10/13/2015 1602   APPEARANCEUR CLEAR 10/13/2015 1602   LABSPEC 1.021 10/13/2015 1602   PHURINE 8.5 (H) 10/13/2015 1602   GLUCOSEU NEGATIVE 10/13/2015 1602   HGBUR NEGATIVE 10/13/2015 1602   BILIRUBINUR NEGATIVE 10/13/2015 1602   KETONESUR NEGATIVE 10/13/2015 1602   PROTEINUR NEGATIVE 10/13/2015 1602   UROBILINOGEN 0.2 09/02/2014 1800   NITRITE NEGATIVE 10/13/2015 1602   LEUKOCYTESUR NEGATIVE 10/13/2015 1602   Sepsis Labs: Invalid input(s): PROCALCITONIN, LACTICIDVEN No results found for this or any previous visit (from the past 240 hour(s)).   Radiological Exams on Admission: No results found.  EKG: Independently reviewed.  sinus  Assessment/Plan Active Problems:   Diabetes mellitus (HCC)   Morbid obesity (HCC)   Cellulitis    Cellulitis with ? Collection -failed PO abx -surgery consult as worsening leukocytosis  uncontrolled DM -check Hgba1C -SSI -insulin  Morbid obesity -encouraged weight loss   DVT prophylaxis: lovenox Code Status: full Family Communication: patient Disposition Plan:  Consults called: surgery    Hairo Garraway Juanetta GoslingU Nial Hawe DO [facility] Pager [phone]  If 7PM-7AM, please contact night-coverage [email or web address] Password [email or web address]  10/19/2015, 11:57 AM

## 2015-10-19 NOTE — ED Notes (Signed)
Patient became nauseated.  PA notified.

## 2015-10-19 NOTE — ED Notes (Addendum)
CBG 366 RN aware

## 2015-10-19 NOTE — ED Notes (Signed)
Patient did not get pain medication earlier because blood pressure low.

## 2015-10-19 NOTE — Anesthesia Postprocedure Evaluation (Signed)
Anesthesia Post Note  Patient: [name]  Procedure(s) Performed: Procedure(s) (LRB): IRRIGATION AND DEBRIDEMENT PRESACRAL ABSCESS (N/A)  Anesthesia Post Evaluation  Last Vitals:  Vitals:   10/19/15 1429 10/19/15 1430  BP:  116/68  Pulse: 77 80  Resp: 15 14  Temp:      Last Pain:  Vitals:   10/19/15 1430  TempSrc:   PainSc: 10-Worst pain ever                 Phillips Groutarignan, Basel Defalco

## 2015-10-19 NOTE — Anesthesia Postprocedure Evaluation (Signed)
Anesthesia Post Note  Patient: [name]  Procedure(s) Performed: Procedure(s) (LRB): IRRIGATION AND DEBRIDEMENT PRESACRAL ABSCESS (N/A)  Patient location during evaluation: PACU Anesthesia Type: General Level of consciousness: awake and alert Pain management: pain level controlled Vital Signs Assessment: post-procedure vital signs reviewed and stable Respiratory status: spontaneous breathing, nonlabored ventilation, respiratory function stable and patient connected to nasal cannula oxygen Cardiovascular status: blood pressure returned to baseline and stable Postop Assessment: no signs of nausea or vomiting Anesthetic complications: no    Last Vitals:  Vitals:   10/19/15 1429 10/19/15 1430  BP:  116/68  Pulse: 77 80  Resp: 15 14  Temp:      Last Pain:  Vitals:   10/19/15 1430  TempSrc:   PainSc: 10-Worst pain ever                 Phillips Groutarignan, Warden Buffa

## 2015-10-19 NOTE — ED Triage Notes (Signed)
Patient complaining of possible abscess to tailbone. [facility] Pt reports that she was dx with cellulitis and prescribed antibiotics both visits and has been compliant with them. Pt reports having serosanguenous drainage from wound that has increased over the past two days.

## 2015-10-19 NOTE — Op Note (Signed)
Patient Name:           [name]   Date of Surgery:        10/19/2015  Pre op Diagnosis:     Complex soft tissue infection, presacral skin subcutaneous tissue and fascia  Post op Diagnosis:    Same  Procedure:                 Debridement of skin, subcutaneous tissue, fascia of presacral soft tissue, 15 cm sagittal by 11 cm transverse by 7 cm deep  1.  Progress note or procedure note with a detailed description of the procedure.  2.  Tool used for debridement (curette, scapel, etc.)  scalpel and cautery  3.  Frequency of surgical debridement.   Initial debridement  4.  Measurement of total devitalized tissue (wound surface) before and after surgical debridement.   15 cm sagittally by 11 cm transversely by 7 cm deep  5.  Area and depth of devitalized tissue removed from wound.  15 cm sagittally by 11 cm transversely by 7 cm deep  6.  Blood loss and description of tissue removed.  100 mL blood loss.  Skin, subcutaneous tissue, and presacral fascia  7.  Evidence of the progress of the wound's response to treatment.  A.  Current wound volume (current dimensions and depth).  Same as above  B.  Presence (and extent of) of infection.  Extensive infection, completely debrided  C.  Presence (and extent of) of non viable tissue.  Same dimensions as above  D.  Other material in the wound that is expected to inhibit healing.  No additional  8.  Was there any viable tissue removed (measurements): Yes.  Skin edges and small amounts of fat at periphery of infected area   Surgeon:                     Angelia Mould. Derrell Lolling, M.D., FACS  Assistant:                      OR staff  Operative Indications:   This is a 58 show afterward female with insulin-dependent diabetes and morbid obesity.  She presented to the emergency room today with a one-week history of progressive pain and swelling and drainage in the presacral skin.  On exam she was in mild distress and somewhat anxious but cooperative.   Large area of induration was noted on the right and to a lesser extent to the left of the midline upper gluteal cleft.  The perianal tissues were normal and were not involved.  Blood glucose was 350.  She was resuscitated, antibiotics started and brought to the operating room immediately.  Operative Findings:       There was not a discrete abscess cavity.  There were multiple channels and this appeared to be a synergistic polymicrobial infection with very foul odor and grayish devitalized tissue.  Following debridement I felt that I had completely excised all the devitalized and infected tissue and I was back to healthy skin, healthy adipose tissue, and healthy fascia which bled well.  Procedure in Detail:          Following the induction of general endotracheal anesthesia the patient was placed in a prone position and the entire gluteal and perianal area prepped and draped in a sterile fashion.  Surgical timeout was performed.  Using a marking pin I marked the limits of the indurated area.  Using a knife I  excised the skin circumferentially.  Using the knife and the cautery I dissected all the way down to the presacral fascia removing a large amount of tissue.  There were a few areas where I had to do further debridement to completely excise the devitalized infected tissues.  The debridement was performed in several steps.  Cultures were taken.  Hemostasis was very good and achieved with electrocautery.  The wound was irrigated multiple times.  Wound was cauterized multiple times.  After I was satisfied with the debridement I measured the wound and then packed it with 3 saline moistened Kerlix's.  Covered with 4 x 4's, ABDs and tape.  The patient remained hemodynamically stable and was transferred to the PACU in stable condition.  EBL 100 250 mL.  Counts correct.  Complications none.     Angelia MouldHaywood M. Derrell LollingIngram, M.D., FACS General and Minimally Invasive Surgery Breast and Colorectal Surgery  10/19/2015 2:22  PM

## 2015-10-19 NOTE — Progress Notes (Signed)
Pharmacy Antibiotic Note  [name] is a 45 y.o. female admitted on 10/19/2015 with buttocks cellulitis/abscess.  Recent ED visits x2 with CT (10/13/15) that showed no evidence of perirectal abscess.  She has failed outpatient treatment with oral Keflex, and also Bactrim.  Surgery is consulted.  Pharmacy has been consulted for Vancomycin, Zosyn dosing.  SCr 0.77, CrCl > 100 ml/min CG/N Lactic acid 1.47 WBC increasing (8.5 > 20.8)  Plan:  Zosyn 3.375g IV Q8H infused over 4hrs.  Vancomycin 2500mg  IV once, then 1250 mg IV q8h.  Measure Vanc trough at steady state.  Follow up renal fxn, culture results, and clinical course.      Temp (24hrs), Avg:98.4 F (36.9 C), Min:98.4 F (36.9 C), Max:98.4 F (36.9 C)   Recent Labs Lab 10/13/15 1554 10/19/15 0910 10/19/15 0938  WBC 8.5 20.8*  --   CREATININE 0.61  --   --   LATICACIDVEN  --   --  1.47    Estimated Creatinine Clearance: 129.3 mL/min (by C-G formula based on SCr of 0.61 mg/dL).    Allergies  Allergen Reactions  . Codeine Shortness Of Breath    Antimicrobials this admission: PTA 9/9 cephalexin >> 9/14 PTA 9/12 bactrim >> 9/13 9/14 Zosyn >>  9/14 Vancomycin >>   Dose adjustments this admission:  Microbiology results: 9/14 BCx:  9/14 UCx:   Thank you for allowing pharmacy to be a part of this patient's care.  Lynann Beaverhristine Zurii Hewes PharmD, BCPS Pager [phone] 10/19/2015 10:04 AM

## 2015-10-19 NOTE — Anesthesia Preprocedure Evaluation (Signed)
Anesthesia Evaluation  Patient identified by MRN, date of birth, ID band Patient awake    Reviewed: Allergy & Precautions, NPO status , Patient's Chart, lab work & pertinent test results  Airway Mallampati: II  TM Distance: >3 FB Neck ROM: Full    Dental no notable dental hx.    Pulmonary neg pulmonary ROS,    Pulmonary exam normal breath sounds clear to auscultation       Cardiovascular negative cardio ROS Normal cardiovascular exam Rhythm:Regular Rate:Normal     Neuro/Psych negative neurological ROS  negative psych ROS   GI/Hepatic negative GI ROS, Neg liver ROS,   Endo/Other  diabetesMorbid obesity  Renal/GU negative Renal ROS  negative genitourinary   Musculoskeletal negative musculoskeletal ROS (+)   Abdominal   Peds negative pediatric ROS (+)  Hematology negative hematology ROS (+)   Anesthesia Other Findings   Reproductive/Obstetrics negative OB ROS                             Anesthesia Physical Anesthesia Plan  ASA: III and emergent  Anesthesia Plan: General   Post-op Pain Management:    Induction: Intravenous  Airway Management Planned: Oral ETT  Additional Equipment:   Intra-op Plan:   Post-operative Plan: Extubation in OR  Informed Consent: I have reviewed the patients History and Physical, chart, labs and discussed the procedure including the risks, benefits and alternatives for the proposed anesthesia with the patient or authorized representative who has indicated his/her understanding and acceptance.   Dental advisory given  Plan Discussed with: CRNA  Anesthesia Plan Comments:         Anesthesia Quick Evaluation

## 2015-10-19 NOTE — ED Provider Notes (Signed)
[facility]-EMERGENCY DEPT Provider Note   CSN: [id number] Arrival date & time: 10/19/15  0749     History   Chief Complaint Chief Complaint  Patient presents with  . Cellulitis    HPI [name] is a 45 y.o. female.  HPI [name] is a 45 y.o. female with PMH significant for morbid obesity and DM who presents with abcess/cellulitis to buttock x 5 days that began draining 3 days ago.  She was initially seen and had CT scan which was negative for perirectal abscess, and labs showed no leukocytosis. Sent home with Keflex.  She returned due to increased drainage and pain.  Bactrim was added, and she has been taking that x 2 days.  She states in the interim the redness and drainage has increased.  Pain controlled with Tramadol.  She is afebrile in the ED, but states that her temp was 101.2 last night.  No N/V, or abdominal pain.  She also states her blood sugar has been running high and she is concerned.  Denies polyuria, polyphagia, SOB, CP.  She does endorse polyuria.   Past Medical History:  Diagnosis Date  . Allergy   . Depression   . Diabetes mellitus   . Morbid obesity [facility])     Patient Active Problem List   Diagnosis Date Noted  . Acute sinus infection 07/07/2013  . Gall stones 07/07/2013  . Osteoarthritis 03/09/2013  . Transaminitis 01/26/2013  . Depression 01/26/2013  . DM type 2 goal A1C below 7.5 01/26/2013  . Type II or unspecified type diabetes mellitus without mention of complication, uncontrolled 07/15/2012  . Disorder of bone and cartilage 07/15/2012  . Morbid obesity (HCC) 07/15/2012  . Other malaise and fatigue 07/15/2012    Past Surgical History:  Procedure Laterality Date  . TUBAL LIGATION      OB History    No data available       Home Medications    Prior to Admission medications   Medication Sig Start Date End Date Taking? Authorizing Provider  albuterol (VENTOLIN HFA) 108 (90 Base) MCG/ACT inhaler Inhale 2 puffs into the lungs  every 6 (six) hours as needed for wheezing or shortness of breath.  06/15/15 06/14/16  Historical Provider, MD  buPROPion (WELLBUTRIN XL) 150 MG 24 hr tablet Take 150 mg by mouth daily. 06/15/15 06/14/16  Historical Provider, MD  cephALEXin (KEFLEX) 500 MG capsule Take 1 capsule (500 mg total) by mouth 4 (four) times daily. 10/13/15   Samantha Tripp Dowless, PA-C  Fexofenadine HCl (ALLEGRA PO) Take 1 tablet by mouth daily as needed (allergies.).    Historical Provider, MD  furosemide (LASIX) 20 MG tablet Take 1 tablet (20 mg total) by mouth daily. 03/16/15 04/06/15  Silas Flood, MD  ibuprofen (ADVIL,MOTRIN) 200 MG tablet Take 800 mg by mouth at bedtime as needed for moderate pain.     Historical Provider, MD  ibuprofen (ADVIL,MOTRIN) 800 MG tablet Take 1 tablet (800 mg total) by mouth 3 (three) times daily. 10/13/15   Samantha Tripp Dowless, PA-C  insulin aspart (NOVOLOG) 100 UNIT/ML injection Inject 8 Units into the skin 3 (three) times daily with meals.  10/02/15   Historical Provider, MD  Insulin Glargine (LANTUS SOLOSTAR) 100 UNIT/ML Solostar Pen Inject 15 Units into the skin at bedtime.  10/02/15   Historical Provider, MD  insulin regular (NOVOLIN R) 100 units/mL injection Inject 0.05 mLs (5 Units total) into the skin 3 (three) times daily before meals. As needed for glucose over  250 09/19/13   Kristen N Ward, DO  metFORMIN (GLUCOPHAGE) 500 MG tablet TAKE ONE TABLET BY MOUTH TWICE DAILY WITH MEALS 01/31/14   Tiffany L Reed, DO  metFORMIN (GLUCOPHAGE) 500 MG tablet Take 1,000 mg by mouth 2 (two) times daily. 06/15/15   Historical Provider, MD  naproxen sodium (ANAPROX) 220 MG tablet Take 440 mg by mouth at bedtime as needed (for pain).    Historical Provider, MD  ondansetron (ZOFRAN) 8 MG tablet Take 1 tablet (8 mg total) by mouth every 8 (eight) hours as needed for nausea or vomiting. 10/17/15   Donnetta Hutching, MD  sertraline (ZOLOFT) 25 MG tablet Take 25 mg by mouth daily. 06/15/15 06/14/16  Historical Provider, MD    sulfamethoxazole-trimethoprim (BACTRIM DS,SEPTRA DS) 800-160 MG tablet Take 1 tablet by mouth 2 (two) times daily. 10/17/15 10/24/15  Donnetta Hutching, MD  traMADol (ULTRAM) 50 MG tablet Take 1 tablet (50 mg total) by mouth 2 (two) times daily as needed. 10/17/15   Donnetta Hutching, MD    Family History Family History  Problem Relation Age of Onset  . Diabetes Mother   . Lymphoma Father   . Lymphoma Brother     Social History Social History  Substance Use Topics  . Smoking status: Never Smoker  . Smokeless tobacco: Never Used  . Alcohol use No     Allergies   Codeine   Review of Systems Review of Systems All other systems negative unless otherwise stated in HPI   Physical Exam Updated Vital Signs BP 142/85 (BP Location: Right Arm)   Pulse 100   Temp 98.4 F (36.9 C) (Oral)   Resp 17   LMP 09/20/2015 (Approximate)   SpO2 100%   Physical Exam  Constitutional: She is oriented to person, place, and time. She appears well-developed and well-nourished.  Non-toxic appearance. She does not have a sickly appearance. She does not appear ill.  HENT:  Head: Normocephalic and atraumatic.  Mouth/Throat: Oropharynx is clear and moist.  Eyes: Conjunctivae are normal. Pupils are equal, round, and reactive to light.  Neck: Normal range of motion. Neck supple.  Cardiovascular: Normal rate and regular rhythm.   Pulmonary/Chest: Effort normal and breath sounds normal. No accessory muscle usage or stridor. No respiratory distress. She has no wheezes. She has no rhonchi. She has no rales.  Abdominal: Soft. Bowel sounds are normal. She exhibits no distension. There is no tenderness.  Musculoskeletal: Normal range of motion.  Lymphadenopathy:    She has no cervical adenopathy.  Neurological: She is alert and oriented to person, place, and time.  Speech clear without dysarthria.  Skin: Skin is warm and dry.  15 cm area of induration at gluteal cleft.  No induration.  Active drainage.  There are 2  small blisters.   Psychiatric: She has a normal mood and affect. Her behavior is normal.     ED Treatments / Results  Labs (all labs ordered are listed, but only abnormal results are displayed) Labs Reviewed  CBC WITH DIFFERENTIAL/PLATELET - Abnormal; Notable for the following:       Result Value   WBC 20.8 (*)    RBC 3.71 (*)    Hemoglobin 10.8 (*)    HCT 30.4 (*)    Neutro Abs 17.7 (*)    Monocytes Absolute 1.3 (*)    All other components within normal limits  CBG MONITORING, ED - Abnormal; Notable for the following:    Glucose-Capillary 366 (*)    All other components within  normal limits  CULTURE, BLOOD (ROUTINE X 2)  CULTURE, BLOOD (ROUTINE X 2)  URINE CULTURE  BASIC METABOLIC PANEL  URINALYSIS, ROUTINE W REFLEX MICROSCOPIC (NOT [facility])  I-STAT CG4 LACTIC ACID, ED    EKG  EKG Interpretation None       Radiology No results found.  Procedures Procedures (including critical care time)  Medications Ordered in ED Medications  sodium chloride 0.9 % bolus 1,000 mL (not administered)     Initial Impression / Assessment and Plan / ED Course  I have reviewed the triage vital signs and the nursing notes.  Pertinent labs & imaging results that were available during my care of the patient were reviewed by me and considered in my medical decision making (see chart for details).  Clinical Course   Patient presents with worsening cellulitis.  Hx of DM.  VSS, NAD.  Afebrile, but febrile at home with temp 101.2.  She is normotensive and borderline tachycardic, HR 100.  15 cm area of induration with active drainage.  This appears to be increased from previous exams.  No fluctuance, I do not think repeat CT is warranted at this time.  She has been compliant with Bactrim and Keflex.  CT scan 10/13/15 showed no perirectal abscess.  Will recheck labs.  Significant leukocytosis, 20.8 with NORMAL lactate.  Patient meets sepsis criteria.  However, patient is normotensive,  borderline tachycardic with HR 100, AFEBRILE, and appears well.  No signs of severe sepsis.  Therefore, code sepsis not activated.  With normal lactate, 30 cc/kg fluid not indicated.  Patient started in 1L IVF as well as vanc and zosyn.  Plan to admit for IV abx 2/2 to failed outpatient therapy.   Case has been discussed with and seen by Dr. Jacqulyn BathLong who agrees with the above plan for admission.   Final Clinical Impressions(s) / ED Diagnoses   Final diagnoses:  Cellulitis of buttock  Sepsis, due to unspecified organism [facility](HCC)    New Prescriptions New Prescriptions   No medications on file     Cheri FowlerKayla Dow Blahnik, PA-C 10/19/15 1102    Maia PlanJoshua G Long, MD 10/19/15 [phone]

## 2015-10-19 NOTE — Progress Notes (Signed)
Valuables retrieved from security.  Daughter signed form stating valuables were returned.  Form placed in patient chart.

## 2015-10-19 NOTE — Progress Notes (Addendum)
ANTICOAGULATION CONSULT NOTE - Initial Consult  Pharmacy Consult for Lovenox Indication: VTE prophylaxis  Allergies  Allergen Reactions  . Codeine Shortness Of Breath   Patient Measurements: Height: 5\' 7"  (170.2 cm) IBW/kg (Calculated) : 61.6 TBW: 140.6 kg  Vital Signs: Temp: 98.3 F (36.8 C) (09/14 1540) Temp Source: Oral (09/14 1540) BP: 99/47 (09/14 1540) Pulse Rate: 74 (09/14 1540)  Labs:  Recent Labs  10/19/15 0910  HGB 10.8*  HCT 30.4*  PLT 248  CREATININE 0.77   Estimated Creatinine Clearance: 129.3 mL/min (by C-G formula based on SCr of 0.77 mg/dL).  Medical History: Past Medical History:  Diagnosis Date  . Allergy   . Depression   . Diabetes mellitus   . Morbid obesity (HCC)    Medications:  Scheduled:  . buPROPion  150 mg Oral Daily  . [START ON 10/20/2015] enoxaparin (LOVENOX) injection  70 mg Subcutaneous Q24H  . fentaNYL      . furosemide  20 mg Oral Daily  . HYDROmorphone      . insulin aspart      . insulin aspart  0-20 Units Subcutaneous Q4H  . insulin aspart  8 Units Subcutaneous TID WC  . insulin glargine  15 Units Subcutaneous QHS  . pantoprazole  40 mg Oral BID  . piperacillin-tazobactam (ZOSYN)  IV  3.375 g Intravenous Q8H  . promethazine      . senna  2 tablet Oral BID  . sertraline  25 mg Oral Daily  . sodium chloride flush  3 mL Intravenous Q12H  . vancomycin  1,250 mg Intravenous Q8H   Assessment: 45 yo morbidly obese F to ED 9/14 with worsening cellulitis, hx of DM, 2 previous ED visits: 9/8 with cervical/vaginal infection - treated with Keflex, returned 9/12 with abx change to Bactrim. Increased pain, drainage from pre-sacral area, no discrete abscess but wound with multiple channels. Area debrided 15 x 11 x 7 cm (deep), 100 ml blood loss.   Goal of Therapy:  VTE prophylaxis in obese patient   Plan:   Lovenox 40mg  SQ daily to begin 9/15 am,  s/p I&D of sacral wound.  Can increase to  0.5mg /kg q24 (70mg  daily) for BMI of  48.5 once hemostasis confirmed  Chilton SiGreen, Cedarius Kersh L 10/19/2015,3:50 PM

## 2015-10-19 NOTE — Anesthesia Procedure Notes (Signed)
Procedure Name: Intubation Date/Time: 10/19/2015 1:15 PM Performed by: Delphia GratesHANDLER, Natthew Marlatt Pre-anesthesia Checklist: Emergency Drugs available, Suction available, Patient being monitored and Patient identified Patient Re-evaluated:Patient Re-evaluated prior to inductionOxygen Delivery Method: Circle system utilized Preoxygenation: Pre-oxygenation with 100% oxygen Intubation Type: IV induction Laryngoscope Size: Mac and 4 Grade View: Grade II Tube type: Oral Tube size: 7.5 mm Number of attempts: 1 Airway Equipment and Method: Stylet Secured at: 21 cm Tube secured with: Tape Dental Injury: Teeth and Oropharynx as per pre-operative assessment

## 2015-10-20 DIAGNOSIS — M726 Necrotizing fasciitis: Secondary | ICD-10-CM

## 2015-10-20 LAB — HIV ANTIBODY (ROUTINE TESTING W REFLEX): HIV Screen 4th Generation wRfx: NONREACTIVE

## 2015-10-20 LAB — CBC
HEMATOCRIT: 27.6 % — AB (ref 36.0–46.0)
HEMOGLOBIN: 9.7 g/dL — AB (ref 12.0–15.0)
MCH: 28.4 pg (ref 26.0–34.0)
MCHC: 35.1 g/dL (ref 30.0–36.0)
MCV: 80.9 fL (ref 78.0–100.0)
Platelets: 233 10*3/uL (ref 150–400)
RBC: 3.41 MIL/uL — ABNORMAL LOW (ref 3.87–5.11)
RDW: 12.8 % (ref 11.5–15.5)
WBC: 13.5 10*3/uL — ABNORMAL HIGH (ref 4.0–10.5)

## 2015-10-20 LAB — CREATININE, SERUM
Creatinine, Ser: 3.43 mg/dL — ABNORMAL HIGH (ref 0.44–1.00)
GFR calc non Af Amer: 15 mL/min — ABNORMAL LOW (ref >60)
GFR, EST AFRICAN AMERICAN: 17 mL/min — AB (ref >60)

## 2015-10-20 LAB — GLUCOSE, CAPILLARY
GLUCOSE-CAPILLARY: 184 mg/dL — AB (ref 65–99)
Glucose-Capillary: 300 mg/dL — ABNORMAL HIGH (ref 65–99)
Glucose-Capillary: 313 mg/dL — ABNORMAL HIGH (ref 65–99)
Glucose-Capillary: 112 mg/dL — ABNORMAL HIGH (ref 65–99)

## 2015-10-20 LAB — URINE CULTURE: CULTURE: NO GROWTH

## 2015-10-20 LAB — VANCOMYCIN, TROUGH: VANCOMYCIN TR: 48 ug/mL — AB (ref 15–20)

## 2015-10-20 LAB — HEMOGLOBIN A1C
HEMOGLOBIN A1C: 11.7 % — AB (ref 4.8–5.6)
MEAN PLASMA GLUCOSE: 289 mg/dL

## 2015-10-20 MED ORDER — POTASSIUM CHLORIDE CRYS ER 20 MEQ PO TBCR
40.0000 meq | EXTENDED_RELEASE_TABLET | Freq: Once | ORAL | Status: AC
  Administered 2015-10-20: 40 meq via ORAL
  Filled 2015-10-20: qty 2

## 2015-10-20 MED ORDER — INSULIN ASPART 100 UNIT/ML ~~LOC~~ SOLN
12.0000 [IU] | Freq: Three times a day (TID) | SUBCUTANEOUS | Status: DC
  Administered 2015-10-20 – 2015-11-03 (×14): 12 [IU] via SUBCUTANEOUS

## 2015-10-20 MED ORDER — SODIUM CHLORIDE 0.9 % IV BOLUS (SEPSIS)
250.0000 mL | Freq: Once | INTRAVENOUS | Status: AC
  Administered 2015-10-20: 250 mL via INTRAVENOUS

## 2015-10-20 MED ORDER — INSULIN GLARGINE 100 UNIT/ML ~~LOC~~ SOLN
25.0000 [IU] | Freq: Every day | SUBCUTANEOUS | Status: DC
Start: 2015-10-20 — End: 2015-11-03
  Administered 2015-10-20 – 2015-11-02 (×11): 25 [IU] via SUBCUTANEOUS
  Filled 2015-10-20 (×17): qty 0.25

## 2015-10-20 NOTE — Progress Notes (Signed)
[facility] Progress Note  1 Day Post-Op  Subjective: NAE overnight. Pain improved after OR debridement yesterday. Denies any pain over sacrum. Reports that the top dressing over the wound has been changed twice since the procedure. Tolerated a carb modified breakfast. States her blood glucose was still 300 mg/dL this AM.    Objective: Vital signs in last 24 hours: Temp:  [97.6 F (36.4 C)-98.8 F (37.1 C)] 98 F (36.7 C) (09/15 0535) Pulse Rate:  [66-80] 66 (09/15 0535) Resp:  [5-18] 14 (09/15 0535) BP: (99-140)/(43-86) 117/65 (09/15 0535) SpO2:  [96 %-100 %] 99 % (09/15 0535) Weight:  [139.3 kg (307 lb)-153.3 kg (337 lb 15.4 oz)] 153.3 kg (337 lb 15.4 oz) (09/14 1724) Last BM Date: 10/20/15  Intake/Output from previous day: 09/14 0701 - 09/15 0700 In: 2690 [P.O.:240; I.V.:1850; IV Piggyback:600] Out: 650 [Urine:250; Blood:400] Intake/Output this shift: No intake/output data recorded.  PE: Gen:  Alert, NAD, pleasant and cooperative Skin: presacral wound with dressing in place, no active drainage or bleeding. Non-tender.   Lab Results:   Recent Labs  10/19/15 0910  WBC 20.8*  HGB 10.8*  HCT 30.4*  PLT 248   BMET  Recent Labs  10/19/15 0910  NA 132*  K 3.2*  CL 95*  CO2 25  GLUCOSE 350*  BUN 15  CREATININE 0.77  CALCIUM 8.6*   PT/INR No results for input(s): LABPROT, INR in the last 72 hours. CMP     Component Value Date/Time   NA 132 (L) 10/19/2015 0910   NA 138 05/05/2013 1649   K 3.2 (L) 10/19/2015 0910   CL 95 (L) 10/19/2015 0910   CO2 25 10/19/2015 0910   GLUCOSE 350 (H) 10/19/2015 0910   BUN 15 10/19/2015 0910   BUN 11 05/05/2013 1649   CREATININE 0.77 10/19/2015 0910   CALCIUM 8.6 (L) 10/19/2015 0910   PROT 7.5 10/13/2015 1554   PROT 7.7 05/05/2013 1649   ALBUMIN 3.1 (L) 10/13/2015 1554   ALBUMIN 3.7 05/05/2013 1649   AST 33 10/13/2015 1554   ALT 25 10/13/2015 1554   ALKPHOS 123 10/13/2015 1554   BILITOT 0.7 10/13/2015  1554   GFRNONAA >60 10/19/2015 0910   GFRAA >60 10/19/2015 0910   Lipase     Component Value Date/Time   LIPASE 18 (L) 09/02/2014 1743       Studies/Results: No results found.  Anti-infectives: Anti-infectives    Start     Dose/Rate Route Frequency Ordered Stop   10/19/15 2000  vancomycin (VANCOCIN) 1,250 mg in sodium chloride 0.9 % 250 mL IVPB     1,250 mg 166.7 mL/hr over 90 Minutes Intravenous Every 8 hours 10/19/15 1255     10/19/15 1800  piperacillin-tazobactam (ZOSYN) IVPB 3.375 g     3.375 g 12.5 mL/hr over 240 Minutes Intravenous Every 8 hours 10/19/15 1255     10/19/15 1030  vancomycin (VANCOCIN) 2,500 mg in sodium chloride 0.9 % 500 mL IVPB     2,500 mg 250 mL/hr over 120 Minutes Intravenous  Once 10/19/15 1007 10/19/15 1304   10/19/15 1015  piperacillin-tazobactam (ZOSYN) IVPB 3.375 g     3.375 g 100 mL/hr over 30 Minutes Intravenous STAT 10/19/15 1007 10/19/15 1128     Assessment/Plan Presacral skin and soft tissue infection POD #1 Irrigation and debridement in OR, Dr. Claud Kelp 10/19/15 - First dressing change scheduled in the OR tomorrow, possible debridement; Saturday, 10/21/15 - Surgical pathology pending  Leukocytosis - 20.8 at presentation, repeat CBC  pending  FEN: carb modified diet, NPO after MN ID: [email or web address] DVT Proph: SCD's     LOS: 1 day    Adam PhenixElizabeth S Simaan , [facility] 10/20/2015, 10:03 AM Pager: [phone] Consults: [phone] Mon-Fri 7:00 am-4:30 pm Sat-Sun 7:00 am-11:30 am

## 2015-10-20 NOTE — Progress Notes (Signed)
Pharmacy Antibiotic Note  [name] is a 45 y.o. female admitted on 10/19/2015 with buttocks cellulitis/abscess.  Recent ED visits x2 with CT (10/13/15) that showed no evidence of perirectal abscess.  She has failed outpatient treatment with oral Keflex, and also Bactrim.  Surgery is consulted and performed I&D 9/14.  Pharmacy has been consulted for Vancomycin, Zosyn dosing.  Day #2 Zosyn 3.375g IV q8h (4 hour infusion time) and Vancomycin 2500mg  IV once, then 1250 mg IV q8h.  Plan:  No dose adjustments this morning. Check VT and SCr tonight.  Follow up renal fxn, culture results, and clinical course.   Height: 5\' 7"  (170.2 cm) Weight: (!) 337 lb 15.4 oz (153.3 kg) IBW/kg (Calculated) : 61.6  Temp (24hrs), Avg:98.2 F (36.8 C), Min:97.6 F (36.4 C), Max:98.8 F (37.1 C)   Recent Labs Lab 10/13/15 1554 10/19/15 0910 10/19/15 0938  WBC 8.5 20.8*  --   CREATININE 0.61 0.77  --   LATICACIDVEN  --   --  1.47    Estimated Creatinine Clearance: 136.4 mL/min (by C-G formula based on SCr of 0.77 mg/dL).    Allergies  Allergen Reactions  . Codeine Shortness Of Breath    Antimicrobials this admission: PTA 9/9 cephalexin >> 9/14 PTA 9/12 bactrim >> 9/13 9/14 Zosyn >>  9/14 Vancomycin >>    Dose adjustments this admission:   Microbiology results: 9/14 BCx:  9/14 UCx:  9/14 abscess presacral cx: IP, gram stain showing GNR, GPC, GPR 9/14 MRSA PCR: +  Thank you for allowing pharmacy to be a part of this patient's care.  Clance BollAmanda Kanetra Ho, PharmD, BCPS Pager: [phone] 10/20/2015 8:36 AM

## 2015-10-20 NOTE — Progress Notes (Signed)
Pt was assisted to the bathroom several times to help with to void. Pt still unable to void. Pt doesn't want to be cath at this time. Pt is drinking water to see if she can void. Bladder scan with only 93ml in bladder.

## 2015-10-20 NOTE — Progress Notes (Signed)
Inpatient Diabetes Program Recommendations  AACE/ADA: New Consensus Statement on Inpatient Glycemic Control (2015)  Target Ranges:  Prepandial:   less than 140 mg/dL      Peak postprandial:   less than 180 mg/dL (1-2 hours)      Critically ill patients:  140 - 180 mg/dL   Lab Results  Component Value Date   GLUCAP 300 (H) 10/20/2015   HGBA1C 11.7 (H) 10/19/2015    Review of Glycemic Control  Blood sugars continue in 200-300s. Needs tighter control for healing.  Inpatient Diabetes Program Recommendations:    Increase Lantus to 25 units QHS Increase Lantus to 12 units tidwc  Will follow closely. Thank you. Ailene Ardshonda Moss Berry, RD, LDN, CDE Inpatient Diabetes Coordinator [phone]

## 2015-10-20 NOTE — Progress Notes (Signed)
Pharmacy Antibiotic Note  [name] is a 45 y.o. female admitted on 10/19/2015 with buttocks cellulitis/abscess.  Recent ED visits x2 with CT (10/13/15) that showed no evidence of perirectal abscess.  She has failed outpatient treatment with oral Keflex, and also Bactrim.  Surgery is consulted and performed I&D 9/14.  Pharmacy has been consulted for Vancomycin and Zosyn dosing.  Day #2 Zosyn 3.375g IV q8h (4 hour infusion time) and Vancomycin 2500mg  IV once, then 1250 mg IV q8h.  Assessment:  Patient now with AKI (SCr 0.77 > 3.43). CrCl ~ 32 ml/min (CG), 23 ml/min/1.1573m2 (normalized)  UOP low  Vancomycin trough level supratherapeutic  Plan:  Due to significantly elevated Vancomycin trough level and AKI, hold all scheduled Vancomycin doses at this time.  Continue present dosing of Zosyn at this time.  F/u BMET in AM.  Recheck random Vancomycin level tomorrow night.    Follow up culture results, clinical course.   Height: 5\' 7"  (170.2 cm) Weight: (!) 337 lb 15.4 oz (153.3 kg) IBW/kg (Calculated) : 61.6  Temp (24hrs), Avg:98.2 F (36.8 C), Min:98 F (36.7 C), Max:98.5 F (36.9 C)   Recent Labs Lab 10/19/15 0910 10/19/15 0938 10/20/15 0716 10/20/15 1914  WBC 20.8*  --  13.5*  --   CREATININE 0.77  --   --  3.43*  LATICACIDVEN  --  1.47  --   --   VANCOTROUGH  --   --   --  48*    Estimated Creatinine Clearance: 31.8 mL/min (by C-G formula based on SCr of 3.43 mg/dL (H)).    Allergies  Allergen Reactions  . Codeine Shortness Of Breath    Antimicrobials this admission: PTA 9/9 cephalexin >> 9/14 PTA 9/12 bactrim >> 9/13 9/14 Zosyn >>  9/14 Vancomycin >>    Dose adjustments this admission: 9/15 1914 VT: 48 mcg/mL-scheduled doses of Vancomycin held   Microbiology results: 9/14 BCx: NGTD 9/14 UCx: NGF 9/14 abscess presacral cx: gram stain showing abundant GNR, abundant GPC in pairs and chains, few GPR 9/14 MRSA PCR: positive  Thank you for allowing  pharmacy to be a part of this patient's care.   Greer PickerelJigna Vineet Kinney, PharmD, BCPS Pager: [phone] 10/20/2015 8:44 PM

## 2015-10-20 NOTE — Progress Notes (Signed)
Patient ID: [name], female   DOB: [birth year], 45 y.o.   MRN: [id number]    PROGRESS NOTE    [name]  TJL:[id number] DOB: May 17, 1970 DOA: 10/19/2015  PCP: [name], MD   Brief Narrative:  45 y.o. female with poorly controlled diabetes and morbid obesity.  Patient has been seen in the ER 3 times in the last 2 weeks the first time was September 8th, CT scan was done that did not show a perirectal abscess but did show a questionable soft tissue prominence around the cervix and vagina.  Patient was sent home on Keflex at that time saying she was compliant with her medications. Patient was again seen on 10/17/2015 by Dr. Lacinda Axon and antibiotic was changed to Bactrim.  Patient states the last 2 days she felt worse.  Area has been draining and pain has increased.   ED Course: In the ER patient was found to have a worsening cellulitis with increased white blood cell count from normal to 20.  Patient had blood cultures drawn and started on vancomycin and Zosyn  Assessment & Plan:   Principal Problem:   Sepsis secondary to Necrotizing soft tissue infection ([name]) - pt met criteria for sepsis with HR > 90, WBC 20K, source soft tissue infection as outlined below  - large area of induration in the right presacral area,  15 cm. sagitally by 4 cm transversely with some necrotic skin  - pt is s/p I&D of skin, subcutaneous tissue, fascia of presacral soft tissue by Dr. Dalbert Batman, post op day #1 - pt reports feeling better  - continue vanc and zosyn day #2 - follow up on culture studies - appreciate surgery team assistance   Active Problems:   Hypokalemia - supplement and repeat BMP in AM    Hyponatremia - mild, pre renal in etiology in the setting of sepsis - keep on IVF - BMP in AM    Diabetes mellitus ([place]), uncontrolled with A1C > 11 - continue Insulin Glargine and SSI     Morbid obesity ([place]) - Body mass index is 52.93 kg/m.  DVT prophylaxis: Lovenox SQ Code  Status: Full  Family Communication: Patient at bedside  Disposition Plan: Home once surgery team clears   Consultants:   Surgery   Procedures:   I&D 9/14 -->  Antimicrobials:   Vancomycin 9/14 -->  Zosyn 9/14 -->   Subjective: No events overnight. Pt reports feeling better.   Objective: Vitals:   10/19/15 1724 10/19/15 2100 10/20/15 0152 10/20/15 0535  BP:  119/63 (!) 109/43 117/65  Pulse:  79 71 66  Resp:  '16 16 14  ' Temp:  98.5 F (36.9 C) 98 F (36.7 C) 98 F (36.7 C)  TempSrc:  Oral Oral Oral  SpO2:  96% 98% 99%  Weight: (!) 153.3 kg (337 lb 15.4 oz)     Height:        Intake/Output Summary (Last 24 hours) at 10/20/15 1330 Last data filed at 10/20/15 0600  Gross per 24 hour  Intake             2690 ml  Output              650 ml  Net             2040 ml   Filed Weights   10/19/15 1540 10/19/15 1724  Weight: (!) 139.3 kg (307 lb) (!) 153.3 kg (337 lb 15.4 oz)    Examination:  General exam: Appears  calm and comfortable  Respiratory system: Clear to auscultation. Respiratory effort normal. Cardiovascular system: S1 & S2 heard, RRR. No JVD, murmurs, rubs, gallops or clicks. No pedal edema. Gastrointestinal system: Abdomen is nondistended, soft and nontender. No organomegaly or masses felt.  Central nervous system: Alert and oriented. No focal neurological deficits.  Data Reviewed: I have personally reviewed following labs and imaging studies  CBC:  Recent Labs Lab 10/13/15 1554 10/19/15 0910 10/20/15 0716  WBC 8.5 20.8* 13.5*  NEUTROABS 6.4 17.7*  --   HGB 12.1 10.8* 9.7*  HCT 35.5* 30.4* 27.6*  MCV 86.2 81.9 80.9  PLT 206 248 884   Basic Metabolic Panel:  Recent Labs Lab 10/13/15 1554 10/19/15 0910  NA 135 132*  K 3.9 3.2*  CL 103 95*  CO2 24 25  GLUCOSE 106* 350*  BUN 8 15  CREATININE 0.61 0.77  CALCIUM 9.3 8.6*   GFR: Estimated Creatinine Clearance: 136.4 mL/min (by C-G formula based on SCr of 0.77 mg/dL). Liver Function  Tests:  Recent Labs Lab 10/13/15 1554  AST 33  ALT 25  ALKPHOS 123  BILITOT 0.7  PROT 7.5  ALBUMIN 3.1*   HbA1C:  Recent Labs  10/19/15 0910  HGBA1C 11.7*   CBG:  Recent Labs Lab 10/19/15 1258 10/19/15 1429 10/19/15 1557 10/19/15 2057 10/20/15 0802  GLUCAP 300* 284* 291* 296* 300*   Urine analysis:    Component Value Date/Time   COLORURINE YELLOW 10/19/2015 1705   APPEARANCEUR CLOUDY (A) 10/19/2015 1705   LABSPEC 1.029 10/19/2015 1705   PHURINE 6.5 10/19/2015 1705   GLUCOSEU >1000 (A) 10/19/2015 1705   HGBUR NEGATIVE 10/19/2015 1705   BILIRUBINUR SMALL (A) 10/19/2015 1705   KETONESUR >80 (A) 10/19/2015 1705   PROTEINUR 30 (A) 10/19/2015 1705   UROBILINOGEN 0.2 09/02/2014 1800   NITRITE NEGATIVE 10/19/2015 1705   LEUKOCYTESUR NEGATIVE 10/19/2015 1705   Recent Results (from the past 240 hour(s))  Aerobic/Anaerobic Culture (surgical/deep wound)     Status: None (Preliminary result)   Collection Time: 10/19/15  1:49 PM  Result Value Ref Range Status   Specimen Description ABSCESS PRESACRAL  Final   Special Requests NONE  Final   Gram Stain   Final    ABUNDANT WBC PRESENT,BOTH PMN AND MONONUCLEAR ABUNDANT GRAM NEGATIVE RODS ABUNDANT GRAM POSITIVE COCCI IN PAIRS AND CHAINS FEW GRAM POSITIVE RODS    Culture   Final    NO GROWTH < 24 HOURS Performed at [facility]    Report Status PENDING  Incomplete  MRSA PCR Screening     Status: Abnormal   Collection Time: 10/19/15  4:12 PM  Result Value Ref Range Status   MRSA by PCR POSITIVE (A) NEGATIVE Final    Comment:        The GeneXpert MRSA Assay (FDA approved for NASAL specimens only), is one component of a comprehensive MRSA colonization surveillance program. It is not intended to diagnose MRSA infection nor to guide or monitor treatment for MRSA infections. RESULT CALLED TO, READ BACK BY AND VERIFIED WITH: DUNKELBERGER,K AT 1821 ON [id number] BY MOSLEY,J       Radiology Studies: No results  found.    Scheduled Meds: . buPROPion  150 mg Oral Daily  . Chlorhexidine Gluconate Cloth  6 each Topical Q0600  . enoxaparin (LOVENOX) injection  40 mg Subcutaneous Q24H  . furosemide  20 mg Oral Daily  . insulin aspart  0-20 Units Subcutaneous TID WC  . insulin aspart  0-5 Units Subcutaneous  QHS  . insulin aspart  8 Units Subcutaneous TID WC  . insulin glargine  15 Units Subcutaneous QHS  . mupirocin ointment  1 application Nasal BID  . pantoprazole  40 mg Oral BID  . piperacillin-tazobactam (ZOSYN)  IV  3.375 g Intravenous Q8H  . senna  2 tablet Oral BID  . sertraline  25 mg Oral Daily  . sodium chloride flush  3 mL Intravenous Q12H  . vancomycin  1,250 mg Intravenous Q8H   Continuous Infusions: . 0.9 % NaCl with KCl 20 mEq / L 75 mL/hr at 10/19/15 1600     LOS: 1 day    Time spent: 20 minutes    Faye Ramsay, MD [facility] Pager [phone]  If 7PM-7AM, please contact night-coverage [email or web address] Password [email or web address] 10/20/2015, 1:30 PM

## 2015-10-20 NOTE — Care Management Note (Signed)
Case Management Note  Patient Details  Name: [name] MRN: [id number] Date of Birth: [birth year]  Subjective/Objective: 45 y/o f admitted w/Peri rectal abscess. POD#1 I&D. From home.                   Action/Plan:d/c plan home.   Expected Discharge Date:   (UNKNOWN)               Expected Discharge Plan:  Home/Self Care  In-House Referral:     Discharge planning Services  CM Consult  Post Acute Care Choice:    Choice offered to:     DME Arranged:    DME Agency:     HH Arranged:    HH Agency:     Status of Service:  In process, will continue to follow  If discussed at Long Length of Stay Meetings, dates discussed:    Additional Comments:  Lanier ClamMahabir, Breyson Kelm, RN 10/20/2015, 10:44 AM

## 2015-10-20 NOTE — Progress Notes (Signed)
Pt is unable to void. Bladder scan with 118ml in her bladder. Dr. Izola PriceMyers was notified. Verbal orders for straight cath if pt is unable to void in few hours and encourage oral liquids. Will continue to monitor pt

## 2015-10-20 NOTE — Progress Notes (Signed)
Received critical Vanc Trough level (48) from lab at 2005; Pharmacy notified and aware; scheduled IV Vanc was held at this time. Will continue to monitor.

## 2015-10-21 ENCOUNTER — Inpatient Hospital Stay (HOSPITAL_COMMUNITY): Payer: Commercial Managed Care - PPO | Admitting: Anesthesiology

## 2015-10-21 ENCOUNTER — Encounter (HOSPITAL_COMMUNITY): Payer: Self-pay | Admitting: Anesthesiology

## 2015-10-21 ENCOUNTER — Inpatient Hospital Stay (HOSPITAL_COMMUNITY): Payer: Commercial Managed Care - PPO

## 2015-10-21 ENCOUNTER — Encounter (HOSPITAL_COMMUNITY)
Admission: EM | Disposition: A | Discharge status: Home / Self Care | Payer: Self-pay | Source: Home / Self Care | Attending: Internal Medicine

## 2015-10-21 HISTORY — PX: INCISION AND DRAINAGE ABSCESS: SHX5864

## 2015-10-21 LAB — BASIC METABOLIC PANEL
ANION GAP: 10 (ref 5–15)
BUN: 33 mg/dL — AB (ref 6–20)
CALCIUM: 7.8 mg/dL — AB (ref 8.9–10.3)
CO2: 20 mmol/L — ABNORMAL LOW (ref 22–32)
Chloride: 104 mmol/L (ref 101–111)
Creatinine, Ser: 4.25 mg/dL — ABNORMAL HIGH (ref 0.44–1.00)
GFR calc Af Amer: 13 mL/min — ABNORMAL LOW (ref >60)
GFR, EST NON AFRICAN AMERICAN: 12 mL/min — AB (ref >60)
Glucose, Bld: 124 mg/dL — ABNORMAL HIGH (ref 65–99)
POTASSIUM: 3.4 mmol/L — AB (ref 3.5–5.1)
SODIUM: 134 mmol/L — AB (ref 135–145)

## 2015-10-21 LAB — CBC
HCT: 26.5 % — ABNORMAL LOW (ref 36.0–46.0)
Hemoglobin: 9.4 g/dL — ABNORMAL LOW (ref 12.0–15.0)
MCH: 28.6 pg (ref 26.0–34.0)
MCHC: 35.5 g/dL (ref 30.0–36.0)
MCV: 80.5 fL (ref 78.0–100.0)
PLATELETS: 229 10*3/uL (ref 150–400)
RBC: 3.29 MIL/uL — AB (ref 3.87–5.11)
RDW: 12.9 % (ref 11.5–15.5)
WBC: 11.2 10*3/uL — AB (ref 4.0–10.5)

## 2015-10-21 LAB — GLUCOSE, CAPILLARY
GLUCOSE-CAPILLARY: 130 mg/dL — AB (ref 65–99)
Glucose-Capillary: 137 mg/dL — ABNORMAL HIGH (ref 65–99)
Glucose-Capillary: 148 mg/dL — ABNORMAL HIGH (ref 65–99)
Glucose-Capillary: 135 mg/dL — ABNORMAL HIGH (ref 65–99)
Glucose-Capillary: 94 mg/dL (ref 65–99)

## 2015-10-21 SURGERY — INCISION AND DRAINAGE, ABSCESS
Anesthesia: General | Site: Coccyx

## 2015-10-21 MED ORDER — PROPOFOL 10 MG/ML IV BOLUS
INTRAVENOUS | Status: AC
  Filled 2015-10-21: qty 20

## 2015-10-21 MED ORDER — SUGAMMADEX SODIUM 500 MG/5ML IV SOLN
INTRAVENOUS | Status: AC
  Filled 2015-10-21: qty 5

## 2015-10-21 MED ORDER — DOXYCYCLINE HYCLATE 100 MG PO TABS
100.0000 mg | ORAL_TABLET | Freq: Two times a day (BID) | ORAL | Status: DC
  Administered 2015-10-21 – 2015-10-22 (×3): 100 mg via ORAL
  Filled 2015-10-21 (×3): qty 1

## 2015-10-21 MED ORDER — PROMETHAZINE HCL 25 MG/ML IJ SOLN: 6.2500 mg | INTRAMUSCULAR | Status: DC | PRN

## 2015-10-21 MED ORDER — MIDAZOLAM HCL 5 MG/5ML IJ SOLN
INTRAMUSCULAR | Status: DC | PRN
  Administered 2015-10-21: 2 mg via INTRAVENOUS

## 2015-10-21 MED ORDER — ROCURONIUM BROMIDE 100 MG/10ML IV SOLN
INTRAVENOUS | Status: DC | PRN
  Administered 2015-10-21: 50 mg via INTRAVENOUS

## 2015-10-21 MED ORDER — SODIUM CHLORIDE 0.9 % IV SOLN
INTRAVENOUS | Status: DC | PRN
  Administered 2015-10-21: 09:00:00 via INTRAVENOUS

## 2015-10-21 MED ORDER — FENTANYL CITRATE (PF) 100 MCG/2ML IJ SOLN
INTRAMUSCULAR | Status: AC
  Filled 2015-10-21: qty 2

## 2015-10-21 MED ORDER — FENTANYL CITRATE (PF) 100 MCG/2ML IJ SOLN
INTRAMUSCULAR | Status: AC
  Filled 2015-10-21: qty 4

## 2015-10-21 MED ORDER — SODIUM CHLORIDE 0.9 % IV BOLUS (SEPSIS)
500.0000 mL | Freq: Once | INTRAVENOUS | Status: AC
  Administered 2015-10-21: 500 mL via INTRAVENOUS

## 2015-10-21 MED ORDER — FENTANYL CITRATE (PF) 100 MCG/2ML IJ SOLN
25.0000 ug | INTRAMUSCULAR | Status: DC | PRN
  Administered 2015-10-21 (×2): 50 ug via INTRAVENOUS

## 2015-10-21 MED ORDER — MIDAZOLAM HCL 2 MG/2ML IJ SOLN: 0.5000 mg | Freq: Once | INTRAMUSCULAR | Status: DC | PRN

## 2015-10-21 MED ORDER — MEPERIDINE HCL 50 MG/ML IJ SOLN: 6.2500 mg | INTRAMUSCULAR | Status: DC | PRN

## 2015-10-21 MED ORDER — LIDOCAINE HCL (CARDIAC) 20 MG/ML IV SOLN
INTRAVENOUS | Status: DC | PRN
  Administered 2015-10-21: 50 mg via INTRAVENOUS

## 2015-10-21 MED ORDER — SUGAMMADEX SODIUM 500 MG/5ML IV SOLN
INTRAVENOUS | Status: DC | PRN
  Administered 2015-10-21: 500 mg via INTRAVENOUS

## 2015-10-21 MED ORDER — ONDANSETRON HCL 4 MG/2ML IJ SOLN
INTRAMUSCULAR | Status: AC
  Filled 2015-10-21: qty 2

## 2015-10-21 MED ORDER — FENTANYL CITRATE (PF) 100 MCG/2ML IJ SOLN
INTRAMUSCULAR | Status: DC | PRN
  Administered 2015-10-21: 100 ug via INTRAVENOUS

## 2015-10-21 MED ORDER — ENOXAPARIN SODIUM 40 MG/0.4ML ~~LOC~~ SOLN
40.0000 mg | SUBCUTANEOUS | Status: DC
Start: 2015-10-21 — End: 2015-10-23
  Administered 2015-10-21 – 2015-10-22 (×2): 40 mg via SUBCUTANEOUS
  Filled 2015-10-21 (×2): qty 0.4

## 2015-10-21 MED ORDER — PROPOFOL 10 MG/ML IV BOLUS
INTRAVENOUS | Status: DC | PRN
  Administered 2015-10-21: 200 mg via INTRAVENOUS

## 2015-10-21 MED ORDER — MIDAZOLAM HCL 2 MG/2ML IJ SOLN
INTRAMUSCULAR | Status: AC
  Filled 2015-10-21: qty 2

## 2015-10-21 SURGICAL SUPPLY — 20 items
BNDG GAUZE ELAST 4 BULKY (GAUZE/BANDAGES/DRESSINGS) ×6 IMPLANT
COVER SURGICAL LIGHT HANDLE (MISCELLANEOUS) ×2 IMPLANT
DRAPE LAPAROTOMY T 102X78X121 (DRAPES) IMPLANT
DRSG PAD ABDOMINAL 8X10 ST (GAUZE/BANDAGES/DRESSINGS) ×4 IMPLANT
ELECT REM PT RETURN 9FT ADLT (ELECTROSURGICAL) ×2
ELECTRODE REM PT RTRN 9FT ADLT (ELECTROSURGICAL) ×1 IMPLANT
EVACUATOR SILICONE 100CC (DRAIN) IMPLANT
GAUZE SPONGE 4X4 12PLY STRL (GAUZE/BANDAGES/DRESSINGS) IMPLANT
GLOVE EUDERMIC 7 POWDERFREE (GLOVE) ×2 IMPLANT
GOWN STRL REUS W/TWL XL LVL3 (GOWN DISPOSABLE) ×4 IMPLANT
KIT BASIN OR (CUSTOM PROCEDURE TRAY) ×2 IMPLANT
MARKER SKIN DUAL TIP RULER LAB (MISCELLANEOUS) IMPLANT
PACK GENERAL/GYN (CUSTOM PROCEDURE TRAY) ×2 IMPLANT
SOL PREP POV-IOD 4OZ 10% (MISCELLANEOUS) ×2 IMPLANT
SPONGE LAP 18X18 X RAY DECT (DISPOSABLE) IMPLANT
SUT MNCRL AB 4-0 PS2 18 (SUTURE) IMPLANT
SUT VIC AB 3-0 SH 18 (SUTURE) IMPLANT
SWAB COLLECTION DEVICE MRSA (MISCELLANEOUS) IMPLANT
SWAB CULTURE ESWAB REG 1ML (MISCELLANEOUS) IMPLANT
TOWEL OR 17X26 10 PK STRL BLUE (TOWEL DISPOSABLE) ×2 IMPLANT

## 2015-10-21 NOTE — Progress Notes (Signed)
Patient ID: [name], female   DOB: 1970/12/31, 45 y.o.   MRN: [id number]    PROGRESS NOTE    [name]  YHC:[id number] DOB: 25-May-1970 DOA: 10/19/2015  PCP: [name], MD   Brief Narrative:  45 y.o. female with poorly controlled diabetes and morbid obesity.  Patient has been seen in the ER 3 times in the last 2 weeks the first time was September 8th, CT scan was done that did not show a perirectal abscess but did show a questionable soft tissue prominence around the cervix and vagina.  Patient was sent home on Keflex at that time saying she was compliant with her medications. Patient was again seen on 10/17/2015 by Dr. Lacinda Axon and antibiotic was changed to Bactrim.  Patient states the last 2 days she felt worse.  Area has been draining and pain has increased.   ED Course: In the ER patient was found to have a worsening cellulitis with increased white blood cell count from normal to 20.  Patient had blood cultures drawn and started on vancomycin and Zosyn  Assessment & Plan:   Principal Problem:   Sepsis secondary to Necrotizing soft tissue infection ([place]) - pt met criteria for sepsis with HR > 90, WBC 20K, source soft tissue infection as outlined below  - large area of induration in the right presacral area,  15 cm. sagitally by 4 cm transversely with some necrotic skin  - pt is s/p I&D of skin, subcutaneous tissue, fascia of presacral soft tissue by Dr. Dalbert Batman, post op day #2, plan to take to OR today to change dressings - pt reports feeling better but concerned with minimal urine output  - continue zosyn day #3, vancomycin stopped due to ARF - follow up on culture studies - appreciate surgery team assistance   Active Problems:   Acute kidney injury, oliguria  - unclear if related to vancomycin, this was discontinued 9/15 - renal US pending - pt has gotten dose of lasix overnight as IVF did not seem to help  - I will stop lasix as well and consult with  nephrologist     Hypokalemia - supplement and repeat BMP in AM    Hyponatremia - mild, pre renal in etiology in the setting of sepsis - BMP in AM    Diabetes mellitus ([place]), uncontrolled with A1C > 11 - continue Insulin Glargine and SSI     Morbid obesity ([place]) - Body mass index is 52.93 kg/m.  DVT prophylaxis: Lovenox SQ Code Status: Full  Family Communication: Patient at bedside  Disposition Plan: Home once surgery team clears   Consultants:   Surgery   Procedures:   I&D 9/14 -->  Antimicrobials:   Vancomycin 9/14 --> 9/15  Zosyn 9/14 -->  Doxycycline 9/16 -->   Subjective: Minimal urine output   Objective: Vitals:   10/20/15 0535 10/20/15 1500 10/20/15 2134 10/21/15 0646  BP: 117/65 126/64 106/62 129/74  Pulse: 66 70 66 64  Resp: '14  20 18  ' Temp: 98 F (36.7 C) 98.4 F (36.9 C) 97.8 F (36.6 C) 98.7 F (37.1 C)  TempSrc: Oral Oral Oral Oral  SpO2: 99% 100% 100% 99%  Weight:      Height:        Intake/Output Summary (Last 24 hours) at 10/21/15 0856 Last data filed at 10/20/15 2142  Gross per 24 hour  Intake              963 ml  Output  100 ml  Net              863 ml   Filed Weights   10/19/15 1540 10/19/15 1724  Weight: (!) 139.3 kg (307 lb) (!) 153.3 kg (337 lb 15.4 oz)    Examination:  General exam: Appears calm and comfortable  Respiratory system: Clear to auscultation. Respiratory effort normal. Cardiovascular system: S1 & S2 heard, RRR. No JVD, murmurs, rubs, gallops or clicks. No pedal edema. Gastrointestinal system: Abdomen is nondistended, soft and nontender. No organomegaly or masses felt.  Central nervous system: Alert and oriented. No focal neurological deficits.  Data Reviewed: I have personally reviewed following labs and imaging studies  CBC:  Recent Labs Lab 10/19/15 0910 10/20/15 0716 10/21/15 0518  WBC 20.8* 13.5* 11.2*  NEUTROABS 17.7*  --   --   HGB 10.8* 9.7* 9.4*  HCT 30.4* 27.6* 26.5*  MCV  81.9 80.9 80.5  PLT 248 233 203   Basic Metabolic Panel:  Recent Labs Lab 10/19/15 0910 10/20/15 1914 10/21/15 0518  NA 132*  --  134*  K 3.2*  --  3.4*  CL 95*  --  104  CO2 25  --  20*  GLUCOSE 350*  --  124*  BUN 15  --  33*  CREATININE 0.77 3.43* 4.25*  CALCIUM 8.6*  --  7.8*   HbA1C:  Recent Labs  10/19/15 0910  HGBA1C 11.7*   CBG:  Recent Labs Lab 10/20/15 0802 10/20/15 1219 10/20/15 1621 10/20/15 2131 10/21/15 0758  GLUCAP 300* 313* 184* 112* 137*   Urine analysis:    Component Value Date/Time   COLORURINE YELLOW 10/19/2015 1705   APPEARANCEUR CLOUDY (A) 10/19/2015 1705   LABSPEC 1.029 10/19/2015 1705   PHURINE 6.5 10/19/2015 1705   GLUCOSEU >1000 (A) 10/19/2015 1705   HGBUR NEGATIVE 10/19/2015 1705   BILIRUBINUR SMALL (A) 10/19/2015 1705   KETONESUR >80 (A) 10/19/2015 1705   PROTEINUR 30 (A) 10/19/2015 1705   UROBILINOGEN 0.2 09/02/2014 1800   NITRITE NEGATIVE 10/19/2015 1705   LEUKOCYTESUR NEGATIVE 10/19/2015 1705   Recent Results (from the past 240 hour(s))  Blood Culture (routine x 2)     Status: None (Preliminary result)   Collection Time: 10/19/15 10:10 AM  Result Value Ref Range Status   Specimen Description BLOOD LEFT ARM  Final   Special Requests BOTTLES DRAWN AEROBIC AND ANAEROBIC 5ML  Final   Culture   Final    NO GROWTH 1 DAY Performed at [facility]    Report Status PENDING  Incomplete  Blood Culture (routine x 2)     Status: None (Preliminary result)   Collection Time: 10/19/15 10:24 AM  Result Value Ref Range Status   Specimen Description BLOOD LEFT HAND  Final   Special Requests IN PEDIATRIC BOTTLE 3ML  Final   Culture   Final    NO GROWTH 1 DAY Performed at [facility]    Report Status PENDING  Incomplete  Aerobic/Anaerobic Culture (surgical/deep wound)     Status: None (Preliminary result)   Collection Time: 10/19/15  1:49 PM  Result Value Ref Range Status   Specimen Description ABSCESS PRESACRAL   Final   Special Requests NONE  Final   Gram Stain   Final    ABUNDANT WBC PRESENT,BOTH PMN AND MONONUCLEAR ABUNDANT GRAM NEGATIVE RODS ABUNDANT GRAM POSITIVE COCCI IN PAIRS AND CHAINS FEW GRAM POSITIVE RODS    Culture   Final    NO GROWTH < 24 HOURS  Performed at [facility]    Report Status PENDING  Incomplete  MRSA PCR Screening     Status: Abnormal   Collection Time: 10/19/15  4:12 PM  Result Value Ref Range Status   MRSA by PCR POSITIVE (A) NEGATIVE Final    Comment:        The GeneXpert MRSA Assay (FDA approved for NASAL specimens only), is one component of a comprehensive MRSA colonization surveillance program. It is not intended to diagnose MRSA infection nor to guide or monitor treatment for MRSA infections. RESULT CALLED TO, READ BACK BY AND VERIFIED WITH: DUNKELBERGER,K AT [place] ON [id number] BY MOSLEY,J   Urine culture     Status: None   Collection Time: 10/19/15  5:05 PM  Result Value Ref Range Status   Specimen Description URINE, RANDOM  Final   Special Requests NONE  Final   Culture NO GROWTH Performed at [facility]   Final   Report Status 10/20/2015 FINAL  Final      Radiology Studies: No results found.    Scheduled Meds: . [MAR Hold] buPROPion  150 mg Oral Daily  . [MAR Hold] Chlorhexidine Gluconate Cloth  6 each Topical Q0600  . [MAR Hold] insulin aspart  0-20 Units Subcutaneous TID WC  . [MAR Hold] insulin aspart  0-5 Units Subcutaneous QHS  . [MAR Hold] insulin aspart  12 Units Subcutaneous TID WC  . [MAR Hold] insulin glargine  25 Units Subcutaneous QHS  . [MAR Hold] mupirocin ointment  1 application Nasal BID  . [MAR Hold] pantoprazole  40 mg Oral BID  . [MAR Hold] senna  2 tablet Oral BID  . [MAR Hold] sertraline  25 mg Oral Daily  . [MAR Hold] sodium chloride flush  3 mL Intravenous Q12H   Continuous Infusions: . 0.9 % NaCl with KCl 20 mEq / L 75 mL/hr at 10/19/15 1600     LOS: 2 days    Time spent: 20 minutes     Faye Ramsay, MD [facility] Pager [phone]  If 7PM-7AM, please contact night-coverage [email or web address] Password [email or web address] 10/21/2015, 8:56 AM

## 2015-10-21 NOTE — Anesthesia Postprocedure Evaluation (Signed)
Anesthesia Post Note  Patient: [name]  Procedure(s) Performed: Procedure(s) (LRB): dressing change to  PRESACRAL ABCESS (N/A)  Patient location during evaluation: PACU Anesthesia Type: General Level of consciousness: awake and alert, oriented and patient cooperative Pain management: pain level controlled Vital Signs Assessment: post-procedure vital signs reviewed and stable Respiratory status: spontaneous breathing, nonlabored ventilation, respiratory function stable and patient connected to nasal cannula oxygen Cardiovascular status: blood pressure returned to baseline and stable Postop Assessment: no signs of nausea or vomiting Anesthetic complications: no    Last Vitals:  Vitals:   10/21/15 1045 10/21/15 1100  BP: 133/79 131/81  Pulse: 64 66  Resp: 16 14  Temp: 36.4 C 36.6 C    Last Pain:  Vitals:   10/21/15 1045  TempSrc:   PainSc: 3                  Stuti Sandin,E. Rosendo Couser

## 2015-10-21 NOTE — Op Note (Signed)
10/19/2015 - 10/21/2015  9:36 AM  PATIENT:  [name]  45 y.o. female  Patient Care Team: Tammy Eartha InchLamonica Boyd, MD as PCP - General (Family Medicine)  PRE-OPERATIVE DIAGNOSIS:  prescral abcess  POST-OPERATIVE DIAGNOSIS:  prescral abcess  PROCEDURE:  Procedure(s): dressing change to  PRESACRAL ABCESS  SURGEON:  Surgeon(s): Romie LeveeAlicia Balinda Heacock, MD  ASSISTANT: none   ANESTHESIA:   general  EBL:  No intake/output data recorded.  DRAINS: none   SPECIMEN:  No Specimen  DISPOSITION OF SPECIMEN:  N/A  COUNTS:  YES  PLAN OF CARE: Pt admitted  PATIENT DISPOSITION:  PACU - hemodynamically stable.  INDICATION: 45 y.o. F with large presacral wound.  She is here today for a 2nd look and dressing change.   OR FINDINGS: no further necrotic tissue.  No infected tissue present.  No bleeding.  DESCRIPTION:   General anesthesia was induced without difficulty.  She was then laid prone on the operating room table.   SCDs were also noted to be in place prior to the initiation of anesthesia.   A surgical timeout was performed indicating the correct patient, procedure, positioning and need for preoperative antibiotics.   Her previous dressing was removed. There was no sign of necrotic or infected tissue. There was no bleeding. The wound was clean. It was approximately 8 cm deep and 15 cm in diameter.  3 saline soaked Kerlix sponges were packed back into the wound and it was covered with a dressing. The patient was then flipped supine on her bed and awakened from anesthesia. She was sent to the postanesthesia care unit in stable condition. All counts were correct per operating room staff.

## 2015-10-21 NOTE — Transfer of Care (Signed)
Immediate Anesthesia Transfer of Care Note  Patient: [name] Patch  Procedure(s) Performed: Procedure(s) with comments: dressing change to  PRESACRAL ABCESS (N/A) - dressing to sacral area  Patient Location: PACU  Anesthesia Type:General  Level of Consciousness:  sedated, patient cooperative and responds to stimulation  Airway & Oxygen Therapy:Patient Spontanous Breathing and Patient connected to face mask oxgen  Post-op Assessment:  Report given to PACU RN and Post -op Vital signs reviewed and stable  Post vital signs:  Reviewed and stable  Last Vitals:  Vitals:   10/21/15 0646 10/21/15 0947  BP: 129/74 (!) 156/75  Pulse: 64   Resp: 18   Temp: 37.1 Price     Complications: No apparent anesthesia complications

## 2015-10-21 NOTE — Consult Note (Signed)
Referring Provider: No ref. provider found Primary Care Physician:  Verlon Au, MD Primary Nephrologist:     Reason for Consultation:   Acute Kidney Injury   Nephrotoxicity and Necrotizing soft tissue infection - oliguria  HPI:45 y.o.femalewith poorly controlled diabetes and morbid obesity. Patient has been seen in the ER 3 times in the last 2 weeks the first time was September 8th, CT scan was done that did not show a perirectal abscess but did show a questionable soft tissue prominence around the cervix and vagina. Patient was sent home on Keflex at that time saying she was compliant with her medications. Patient was again seen on 10/17/2015 by Dr. Adriana Simas and antibiotic was changed to Bactrim. Patient states the last 2 days she felt worse. Area has been draining and pain has increased.   Past Medical History:  Diagnosis Date  . Allergy   . Depression   . Diabetes mellitus   . Morbid obesity (HCC)     Past Surgical History:  Procedure Laterality Date  . IRRIGATION AND DEBRIDEMENT ABSCESS N/A 10/19/2015   Procedure: IRRIGATION AND DEBRIDEMENT PRESACRAL ABSCESS;  Surgeon: Claud Kelp, MD;  Location: [facility];  Service: General;  Laterality: N/A;  . TUBAL LIGATION      Prior to Admission medications   Medication Sig Start Date End Date Taking? Authorizing Provider  albuterol (VENTOLIN HFA) 108 (90 Base) MCG/ACT inhaler Inhale 2 puffs into the lungs every 6 (six) hours as needed for wheezing or shortness of breath.  06/15/15 06/14/16 Yes Historical Provider, MD  buPROPion (WELLBUTRIN XL) 150 MG 24 hr tablet Take 150 mg by mouth daily. 06/15/15 06/14/16 Yes Historical Provider, MD  cephALEXin (KEFLEX) 500 MG capsule Take 1 capsule (500 mg total) by mouth 4 (four) times daily. 10/13/15  Yes Samantha Tripp Dowless, PA-C  Fexofenadine HCl (ALLEGRA PO) Take 1 tablet by mouth daily as needed (allergies.).   Yes Historical Provider, MD  ibuprofen (ADVIL,MOTRIN) 200 MG tablet Take 800 mg by  mouth at bedtime as needed for moderate pain.    Yes Historical Provider, MD  ibuprofen (ADVIL,MOTRIN) 800 MG tablet Take 1 tablet (800 mg total) by mouth 3 (three) times daily. 10/13/15  Yes Samantha Tripp Dowless, PA-C  insulin aspart (NOVOLOG) 100 UNIT/ML injection Inject 8 Units into the skin 3 (three) times daily with meals.  10/02/15  Yes Historical Provider, MD  Insulin Glargine (LANTUS SOLOSTAR) 100 UNIT/ML Solostar Pen Inject 15 Units into the skin at bedtime.  10/02/15  Yes Historical Provider, MD  metFORMIN (GLUCOPHAGE) 500 MG tablet TAKE ONE TABLET BY MOUTH TWICE DAILY WITH MEALS 01/31/14  Yes Tiffany L Reed, DO  metFORMIN (GLUCOPHAGE) 500 MG tablet Take 1,000 mg by mouth 2 (two) times daily. 06/15/15  Yes Historical Provider, MD  naproxen sodium (ANAPROX) 220 MG tablet Take 440 mg by mouth at bedtime as needed (for pain).   Yes Historical Provider, MD  ondansetron (ZOFRAN) 8 MG tablet Take 1 tablet (8 mg total) by mouth every 8 (eight) hours as needed for nausea or vomiting. 10/17/15  Yes Donnetta Hutching, MD  senna-docusate (SENOKOT-S) 8.6-50 MG tablet Take 4 tablets by mouth daily as needed for mild constipation.   Yes Historical Provider, MD  sertraline (ZOLOFT) 25 MG tablet Take 25 mg by mouth daily. 06/15/15 06/14/16 Yes Historical Provider, MD  sulfamethoxazole-trimethoprim (BACTRIM DS,SEPTRA DS) 800-160 MG tablet Take 1 tablet by mouth 2 (two) times daily. 10/17/15 10/24/15 Yes Donnetta Hutching, MD  traMADol (ULTRAM) 50 MG tablet Take  1 tablet (50 mg total) by mouth 2 (two) times daily as needed. Patient taking differently: Take 50 mg by mouth 2 (two) times daily as needed for moderate pain.  10/17/15  Yes Donnetta Hutching, MD  furosemide (LASIX) 20 MG tablet Take 1 tablet (20 mg total) by mouth daily. 03/16/15 04/06/15  Silas Flood, MD  insulin regular (NOVOLIN R) 100 units/mL injection Inject 0.05 mLs (5 Units total) into the skin 3 (three) times daily before meals. As needed for glucose over 250 Patient not  taking: Reported on 10/19/2015 09/19/13   Layla Maw Ward, DO    Current Facility-Administered Medications  Medication Dose Route Frequency Provider Last Rate Last Dose  . 0.9 % NaCl with KCl 20 mEq/ L  infusion   Intravenous Continuous Joseph Art, DO 75 mL/hr at 10/19/15 1600    . albuterol (PROVENTIL) (2.5 MG/3ML) 0.083% nebulizer solution 2.5 mg  2.5 mg Nebulization Q6H PRN Joseph Art, DO      . buPROPion (WELLBUTRIN XL) 24 hr tablet 150 mg  150 mg Oral Daily Jessica U Vann, DO   150 mg at 10/20/15 1011  . Chlorhexidine Gluconate Cloth 2 % PADS 6 each  6 each Topical Q0600 Joseph Art, DO   6 each at 10/21/15 0600  . doxycycline (VIBRA-TABS) tablet 100 mg  100 mg Oral Q12H Dorothea Ogle, MD      . fentaNYL (SUBLIMAZE) 100 MCG/2ML injection           . HYDROmorphone (DILAUDID) injection 1 mg  1 mg Intravenous Q1H PRN Claud Kelp, MD      . insulin aspart (novoLOG) injection 0-20 Units  0-20 Units Subcutaneous TID WC Joseph Art, DO   4 Units at 10/20/15 1812  . insulin aspart (novoLOG) injection 0-5 Units  0-5 Units Subcutaneous QHS Joseph Art, DO   3 Units at 10/19/15 2214  . insulin aspart (novoLOG) injection 12 Units  12 Units Subcutaneous TID WC Dorothea Ogle, MD   12 Units at 10/20/15 1812  . insulin glargine (LANTUS) injection 25 Units  25 Units Subcutaneous QHS Dorothea Ogle, MD   25 Units at 10/20/15 2121  . mupirocin ointment (BACTROBAN) 2 % 1 application  1 application Nasal BID Joseph Art, DO   1 application at 10/20/15 2121  . ondansetron (ZOFRAN-ODT) disintegrating tablet 4 mg  4 mg Oral Q6H PRN Claud Kelp, MD   4 mg at 10/20/15 2306   Or  . ondansetron (ZOFRAN) injection 4 mg  4 mg Intravenous Q6H PRN Claud Kelp, MD   4 mg at 10/21/15 0915  . oxyCODONE (Oxy IR/ROXICODONE) immediate release tablet 5-10 mg  5-10 mg Oral Q4H PRN Claud Kelp, MD   10 mg at 10/21/15 0008  . pantoprazole (PROTONIX) EC tablet 40 mg  40 mg Oral BID Claud Kelp, MD   40  mg at 10/20/15 2120  . senna (SENOKOT) tablet 17.2 mg  2 tablet Oral BID Claud Kelp, MD   17.2 mg at 10/20/15 2120  . senna-docusate (Senokot-S) tablet 4 tablet  4 tablet Oral Daily PRN Claud Kelp, MD      . sertraline (ZOLOFT) tablet 25 mg  25 mg Oral Daily Jessica U Vann, DO   25 mg at 10/20/15 1011  . sodium chloride flush (NS) 0.9 % injection 3 mL  3 mL Intravenous Q12H Joseph Art, DO   3 mL at 10/20/15 2200    Allergies as of 10/19/2015 -  Review Complete 10/19/2015  Allergen Reaction Noted  . Codeine Shortness Of Breath 03/04/2011    Family History  Problem Relation Age of Onset  . Diabetes Mother   . Lymphoma Father   . Lymphoma Brother     Social History   Social History  . Marital status: Single    Spouse name: N/A  . Number of children: N/A  . Years of education: N/A   Occupational History  . Not on file.   Social History Main Topics  . Smoking status: Never Smoker  . Smokeless tobacco: Never Used  . Alcohol use No  . Drug use: No  . Sexual activity: No   Other Topics Concern  . Not on file   Social History Narrative  . No narrative on file    Review of Systems: Gen: Denies any fever, chills, sweats, anorexia, fatigue, weakness, malaise, weight loss, and sleep disorder HEENT: No visual complaints, No history of Retinopathy. Normal external appearance No Epistaxis or Sore throat. No sinusitis.   CV: Denies chest pain, angina, palpitations, syncope, orthopnea, PND, peripheral edema, and claudication. Resp: Denies dyspnea at rest, dyspnea with exercise, cough, sputum, wheezing, coughing up blood, and pleurisy. GI: Denies vomiting blood, jaundice, and fecal incontinence.   Denies dysphagia or odynophagia. GU : Denies urinary burning, blood in urine, urinary frequency, urinary hesitancy, nocturnal urination, and urinary incontinence.  No renal calculi. MS: Denies joint pain, limitation of movement, and swelling, stiffness, low back pain, extremity  pain. Denies muscle weakness, cramps, atrophy.  No use of non steroidal antiinflammatory drugs. Derm: Denies rash, itching, dry skin, hives, moles, warts, or unhealing ulcers.  Psych: Denies depression, anxiety, memory loss, suicidal ideation, hallucinations, paranoia, and confusion. Heme: Denies bruising, bleeding, and enlarged lymph nodes. Neuro: No headache.  No diplopia. No dysarthria.  No dysphasia.  No history of CVA.  No Seizures. No paresthesias.  No weakness. Endocrine No DM.  No Thyroid disease.  No Adrenal disease.  Physical Exam: Vital signs in last 24 hours: Temp:  [97.5 F (36.4 C)-98.7 F (37.1 C)] 97.8 F (36.6 C) (09/16 1100) Pulse Rate:  [63-70] 66 (09/16 1100) Resp:  [14-21] 14 (09/16 1100) BP: (106-156)/(55-81) 131/81 (09/16 1100) SpO2:  [96 %-100 %] 98 % (09/16 1100) Last BM Date: 10/20/15 General:   Alert,  Well-developed, well-nourished, pleasant and cooperative in NAD Head:  Normocephalic and atraumatic. Eyes:  Sclera clear, no icterus.   Conjunctiva pink. Ears:  Normal auditory acuity. Nose:  No deformity, discharge,  or lesions. Mouth:  No deformity or lesions, dentition normal. Neck:  Supple; no masses or thyromegaly. JVP not elevated Lungs:  Clear throughout to auscultation.   No wheezes, crackles, or rhonchi. No acute distress. Heart:  Regular rate and rhythm; no murmurs, clicks, rubs,  or gallops. Abdomen:  Soft, nontender and nondistended. No masses, hepatosplenomegaly or hernias noted. Normal bowel sounds, without guarding, and without rebound.   Msk:  Symmetrical without gross deformities. Normal posture. Pulses:  No carotid, renal, femoral bruits. DP and PT symmetrical and equal Extremities:  Without clubbing or edema. Neurologic:  Alert and  oriented x4;  grossly normal neurologically. Skin:  Intact without significant lesions or rashes. Cervical Nodes:  No significant cervical adenopathy. Psych:  Alert and cooperative. Normal mood and  affect.  Intake/Output from previous day: 09/15 0701 - 09/16 0700 In: 963 [P.O.:960; I.V.:3] Out: 100 [Urine:100] Intake/Output this shift: Total I/O In: 700 [I.V.:700] Out: -   Lab Results:  Recent Labs  10/19/15  [id number] 10/20/15 0716 10/21/15 0518  WBC 20.8* 13.5* 11.2*  HGB 10.8* 9.7* 9.4*  HCT 30.4* 27.6* 26.5*  PLT 248 233 229   BMET  Recent Labs  10/19/15 0910 10/20/15 1914 10/21/15 0518  NA 132*  --  134*  K 3.2*  --  3.4*  CL 95*  --  104  CO2 25  --  20*  GLUCOSE 350*  --  124*  BUN 15  --  33*  CREATININE 0.77 3.43* 4.25*  CALCIUM 8.6*  --  7.8*   LFT No results for input(s): PROT, ALBUMIN, AST, ALT, ALKPHOS, BILITOT, BILIDIR, IBILI in the last 72 hours. PT/INR No results for input(s): LABPROT, INR in the last 72 hours. Hepatitis Panel No results for input(s): HEPBSAG, HCVAB, HEPAIGM, HEPBIGM in the last 72 hours.  Studies/Results: No results found.  Assessment/Plan: . Acute Kidney Injury with underlying shock and hypotension, Patient received Vancomycin. No IV contrast enhanced studies  Urinalysis showed > 1000 glucose and some ketones on 9/14  No wbc  Renal ultrasound in pending      Monitor Is and Os      Serial renal panels daily      Avoid nephrotoxins      Baseline creatinine normal on admission  - Hypotension          Resolved        Avoid cardiodepressive medications  - Hypokalemia         Replete   - Mild metabolic acidosis         Will follow   LOS: 2 Terrilynn Postell W @TODAY @11 :25 AM

## 2015-10-21 NOTE — Anesthesia Preprocedure Evaluation (Addendum)
Anesthesia Evaluation  Patient identified by MRN, date of birth, ID band Patient awake    Reviewed: Allergy & Precautions, NPO status , Patient's Chart, lab work & pertinent test results  History of Anesthesia Complications Negative for: history of anesthetic complications  Airway Mallampati: I  TM Distance: >3 FB Neck ROM: Full    Dental  (+) Poor Dentition, Dental Advisory Given, Missing   Pulmonary asthma , COPD,  COPD inhaler,    breath sounds clear to auscultation       Cardiovascular negative cardio ROS   Rhythm:Regular Rate:Normal     Neuro/Psych Depression negative neurological ROS     GI/Hepatic Neg liver ROS, GERD  Medicated and Controlled,  Endo/Other  diabetes (glu 124), Insulin Dependent, Oral Hypoglycemic AgentsMorbid obesity  Renal/GU Renal Insufficiency and ARFRenal disease (creat 4.25)     Musculoskeletal  (+) Arthritis ,   Abdominal (+) + obese,   Peds  Hematology negative hematology ROS (+)   Anesthesia Other Findings   Reproductive/Obstetrics                            Anesthesia Physical Anesthesia Plan  ASA: III  Anesthesia Plan: General   Post-op Pain Management:    Induction: Intravenous  Airway Management Planned: Oral ETT  Additional Equipment:   Intra-op Plan:   Post-operative Plan: Extubation in OR  Informed Consent: I have reviewed the patients History and Physical, chart, labs and discussed the procedure including the risks, benefits and alternatives for the proposed anesthesia with the patient or authorized representative who has indicated his/her understanding and acceptance.   Dental advisory given  Plan Discussed with: CRNA and Surgeon  Anesthesia Plan Comments: (Plan routine monitors, GETA)        Anesthesia Quick Evaluation

## 2015-10-21 NOTE — Progress Notes (Signed)
2 Days Post-Op  Subjective: No complaints, cannot urinate   Objective: Vital signs in last 24 hours: Temp:  [97.8 F (36.6 C)-98.7 F (37.1 C)] 98.7 F (37.1 C) (09/16 0646) Pulse Rate:  [64-70] 64 (09/16 0646) Resp:  [18-20] 18 (09/16 0646) BP: (106-129)/(62-74) 129/74 (09/16 0646) SpO2:  [99 %-100 %] 99 % (09/16 0646) Last BM Date: 10/20/15  Intake/Output from previous day: 09/15 0701 - 09/16 0700 In: 963 [P.O.:960; I.V.:3] Out: 100 [Urine:100] Intake/Output this shift: No intake/output data recorded.  Wound dressed hard to examine in bed due to habitus   Lab Results:   Recent Labs  10/20/15 0716 10/21/15 0518  WBC 13.5* 11.2*  HGB 9.7* 9.4*  HCT 27.6* 26.5*  PLT 233 229   BMET  Recent Labs  10/19/15 0910 10/20/15 1914 10/21/15 0518  NA 132*  --  134*  K 3.2*  --  3.4*  CL 95*  --  104  CO2 25  --  20*  GLUCOSE 350*  --  124*  BUN 15  --  33*  CREATININE 0.77 3.43* 4.25*  CALCIUM 8.6*  --  7.8*   PT/INR No results for input(s): LABPROT, INR in the last 72 hours. ABG No results for input(s): PHART, HCO3 in the last 72 hours.  Invalid input(s): PCO2, PO2  Studies/Results: No results found.  Anti-infectives: Anti-infectives    Start     Dose/Rate Route Frequency Ordered Stop   10/19/15 2000  vancomycin (VANCOCIN) 1,250 mg in sodium chloride 0.9 % 250 mL IVPB  Status:  Discontinued     1,250 mg 166.7 mL/hr over 90 Minutes Intravenous Every 8 hours 10/19/15 1255 10/20/15 2014   10/19/15 1800  piperacillin-tazobactam (ZOSYN) IVPB 3.375 g  Status:  Discontinued     3.375 g 12.5 mL/hr over 240 Minutes Intravenous Every 8 hours 10/19/15 1255 10/21/15 0734   10/19/15 1030  vancomycin (VANCOCIN) 2,500 mg in sodium chloride 0.9 % 500 mL IVPB     2,500 mg 250 mL/hr over 120 Minutes Intravenous  Once 10/19/15 1007 10/19/15 1304   10/19/15 1015  piperacillin-tazobactam (ZOSYN) IVPB 3.375 g     3.375 g 100 mL/hr over 30 Minutes Intravenous STAT 10/19/15  1007 10/19/15 1128      Assessment/Plan: Presacral skin and soft tissue infection POD #2 Irrigation and debridement in OR, Dr. Claud KelpHaywood Ingram 10/19/15 -OR today for dressing change to ensure no further dead tissue QMV:HQIOARF:vanc zosyn at current doses stopped, I stopped lovenox, will likely need us kidneys and may need nephrology to see as well, will discuss with medical team FEN: carb modified diet, NPO after MN ID: renally dose today DVT Proph: SCD's  [facility] HospitalWAKEFIELD,Chayse Gracey 10/21/2015

## 2015-10-21 NOTE — Progress Notes (Signed)
ANTICOAGULATION CONSULT NOTE - Initial Consult  Pharmacy Consult for Lovenox Indication: VTE prophylaxis  Allergies  Allergen Reactions  . Codeine Shortness Of Breath   Patient Measurements: Height: 5\' 7"  (170.2 cm) Weight: (!) 337 lb 15.4 oz (153.3 kg) IBW/kg (Calculated) : 61.6 TBW: 140.6 kg  Vital Signs: Temp: 97.8 F (36.6 C) (09/16 1100) Temp Source: Oral (09/16 0646) BP: 131/81 (09/16 1100) Pulse Rate: 66 (09/16 1100)  Labs:  Recent Labs  10/19/15 0910 10/20/15 0716 10/20/15 1914 10/21/15 0518  HGB 10.8* 9.7*  --  9.4*  HCT 30.4* 27.6*  --  26.5*  PLT 248 233  --  229  CREATININE 0.77  --  3.43* 4.25*   Estimated Creatinine Clearance: 25.7 mL/min (by C-G formula based on SCr of 4.25 mg/dL (H)).  Medical History: Past Medical History:  Diagnosis Date  . Allergy   . Depression   . Diabetes mellitus   . Morbid obesity (HCC)    Medications:  Scheduled:  . buPROPion  150 mg Oral Daily  . Chlorhexidine Gluconate Cloth  6 each Topical Q0600  . doxycycline  100 mg Oral Q12H  . fentaNYL      . insulin aspart  0-20 Units Subcutaneous TID WC  . insulin aspart  0-5 Units Subcutaneous QHS  . insulin aspart  12 Units Subcutaneous TID WC  . insulin glargine  25 Units Subcutaneous QHS  . mupirocin ointment  1 application Nasal BID  . pantoprazole  40 mg Oral BID  . senna  2 tablet Oral BID  . sertraline  25 mg Oral Daily  . sodium chloride flush  3 mL Intravenous Q12H   Assessment: 45 yo morbidly obese F to ED 9/14 with worsening cellulitis, hx of DM, 2 previous ED visits: 9/8 with cervical/vaginal infection - treated with Keflex, returned 9/12 with abx change to Bactrim. Increased pain, drainage from pre-sacral area, no discrete abscess but wound with multiple channels. Area debrided 15 x 11 x 7 cm (deep), 100 ml blood loss. Repeat dressing change 9/16 w/ no bleeding noted and no further invasive measures required.  Goal of Therapy:  VTE prophylaxis in obese  patient   Plan:   Currently held by surgery for AKI, but as long as CrCl > 15, MD feels OK to resume. Would normally increase to 0.5 mg/kg for BMI > 30; however, would leave at 40 mg d/t new renal failure with CrCl < 30.    Bernadene Personrew Amorie Rentz, PharmD, BCPS Pager: [phone] 10/21/2015, 11:57 AM

## 2015-10-21 NOTE — Anesthesia Procedure Notes (Signed)
Procedure Name: Intubation Date/Time: 10/21/2015 9:31 AM Performed by: Morrison Mcbryar, Nuala AlphaKRISTOPHER Pre-anesthesia Checklist: Patient identified, Emergency Drugs available, Suction available, Patient being monitored and Timeout performed Patient Re-evaluated:Patient Re-evaluated prior to inductionOxygen Delivery Method: Circle system utilized Preoxygenation: Pre-oxygenation with 100% oxygen Intubation Type: IV induction Ventilation: Mask ventilation without difficulty Laryngoscope Size: Mac and 4 Grade View: Grade II Tube type: Oral Tube size: 7.5 mm Number of attempts: 1 Airway Equipment and Method: Stylet Placement Confirmation: ETT inserted through vocal cords under direct vision,  positive ETCO2,  CO2 detector and breath sounds checked- equal and bilateral Secured at: 22 cm Tube secured with: Tape Dental Injury: Teeth and Oropharynx as per pre-operative assessment

## 2015-10-21 NOTE — Progress Notes (Signed)
Pt's last urine output was at 1800 a total of 100 cc before shift change; several attempts to use the bathroom throughout midnight but unable to void; at 0130, was bladder scanned with >85 cc in; NP on call paged and notified of pt's lack of urine output; NP gave an order of NS 500 cc bolus. Will continue to monitor.

## 2015-10-22 LAB — BASIC METABOLIC PANEL
ANION GAP: 14 (ref 5–15)
BUN: 42 mg/dL — ABNORMAL HIGH (ref 6–20)
CALCIUM: 7.8 mg/dL — AB (ref 8.9–10.3)
CHLORIDE: 104 mmol/L (ref 101–111)
CO2: 15 mmol/L — AB (ref 22–32)
Creatinine, Ser: 6.53 mg/dL — ABNORMAL HIGH (ref 0.44–1.00)
GFR, EST AFRICAN AMERICAN: 8 mL/min — AB (ref >60)
GFR, EST NON AFRICAN AMERICAN: 7 mL/min — AB (ref >60)
Glucose, Bld: 139 mg/dL — ABNORMAL HIGH (ref 65–99)
POTASSIUM: 3.8 mmol/L (ref 3.5–5.1)
SODIUM: 133 mmol/L — AB (ref 135–145)

## 2015-10-22 LAB — GLUCOSE, CAPILLARY
Glucose-Capillary: 133 mg/dL — ABNORMAL HIGH (ref 65–99)
Glucose-Capillary: 152 mg/dL — ABNORMAL HIGH (ref 65–99)
Glucose-Capillary: 107 mg/dL — ABNORMAL HIGH (ref 65–99)
Glucose-Capillary: 109 mg/dL — ABNORMAL HIGH (ref 65–99)

## 2015-10-22 MED ORDER — CEFTRIAXONE SODIUM 1 G IJ SOLR
1.0000 g | INTRAMUSCULAR | Status: DC
  Administered 2015-10-22 – 2015-10-24 (×3): 1 g via INTRAVENOUS
  Filled 2015-10-22 (×4): qty 10

## 2015-10-22 MED ORDER — PROMETHAZINE HCL 25 MG/ML IJ SOLN
25.0000 mg | Freq: Four times a day (QID) | INTRAMUSCULAR | Status: DC | PRN
  Administered 2015-10-22 – 2015-10-23 (×2): 25 mg via INTRAVENOUS
  Filled 2015-10-22 (×2): qty 1

## 2015-10-22 NOTE — Progress Notes (Signed)
Patient ID: [name], female   DOB: 1970-04-23, 45 y.o.   MRN: [id number]    PROGRESS NOTE    [name]  SFS:[id number] DOB: 07-23-1970 DOA: 10/19/2015  PCP: [name], MD   Brief Narrative:  45 y.o. female with poorly controlled diabetes and morbid obesity.  Patient has been seen in the ER 3 times in the last 2 weeks the first time was September 8th, CT scan was done that did not show a perirectal abscess but did show a questionable soft tissue prominence around the cervix and vagina.  Patient was sent home on Keflex at that time saying she was compliant with her medications. Patient was again seen on 10/17/2015 by Dr. Lacinda Axon and antibiotic was changed to Bactrim.  Patient states the last 2 days she felt worse.  Area has been draining and pain has increased.   ED Course: In the ER patient was found to have a worsening cellulitis with increased white blood cell count from normal to 20.  Patient had blood cultures drawn and started on vancomycin and Zosyn.  Assessment & Plan:   Principal Problem:   Sepsis secondary to Necrotizing soft tissue infection ([place]) - pt met criteria for sepsis with HR > 90, WBC 20K, source soft tissue infection as outlined below  - large area of induration in the right presacral area,  15 cm. sagitally by 4 cm transversely with some necrotic skin  - pt is s/p I&D of skin, subcutaneous tissue, fascia of presacral soft tissue by Dr. Dalbert Batman, post op day #3 - pt reports feeling better but concerned with minimal urine output  - was on zosyn but wound cultures positive for strep virid., change abx to rocephin  - vancomycin stopped due to ARF - follow up on culture studies - appreciate surgery team assistance    Active Problems:   Acute kidney injury, oliguria  - unclear if related to vancomycin, this was discontinued 9/15 - renal US with no acute findings  - pt reported some urine output, overall improving urine output  - Cr still  trending up, suspect this will plateau and hopefully will start trending down  - BMP in AM - appreciate Dr. Jason Nest assistance     Hypokalemia - supplement and repeat BMP in AM    Hyponatremia - mild, pre renal in etiology in the setting of sepsis - BMP in AM    Diabetes mellitus ([place]), uncontrolled with A1C > 11 - continue Insulin Glargine and SSI     Morbid obesity ([place]) - Body mass index is 52.93 kg/m.  DVT prophylaxis: Lovenox SQ Code Status: Full  Family Communication: Patient at bedside  Disposition Plan: Home once surgery team clears   Consultants:   Surgery   Nephrology   Procedures:   I&D 9/14 -->  Antimicrobials:   Vancomycin 9/14 --> 9/15  Zosyn 9/14 --> 9/17  Doxycycline 9/16 --> 9/17  Rocephin 9/17 -->  Subjective: Better urine output   Objective: Vitals:   10/21/15 1100 10/21/15 1716 10/21/15 2228 10/22/15 0612  BP: 131/81 106/73 119/61 (!) 103/56  Pulse: 66 (!) 58 67 64  Resp: '14 14 16 16  ' Temp: 97.8 F (36.6 C) 97.4 F (36.3 C) 97.7 F (36.5 C) 97 F (36.1 C)  TempSrc:  Oral Oral Oral  SpO2: 98% 99% 100% 100%  Weight:      Height:        Intake/Output Summary (Last 24 hours) at 10/22/15 1209 Last data filed at  10/21/15 1330  Gross per 24 hour  Intake              300 ml  Output              100 ml  Net              200 ml   Filed Weights   10/19/15 1540 10/19/15 1724  Weight: (!) 139.3 kg (307 lb) (!) 153.3 kg (337 lb 15.4 oz)    Examination:  General exam: Appears calm and comfortable  Respiratory system: Clear to auscultation. Respiratory effort normal. Cardiovascular system: S1 & S2 heard, RRR. No JVD, murmurs, rubs, gallops or clicks. No pedal edema. Gastrointestinal system: Abdomen is nondistended, soft and nontender. No organomegaly or masses felt.  Central nervous system: Alert and oriented. No focal neurological deficits.  Data Reviewed: I have personally reviewed following labs and imaging  studies  CBC:  Recent Labs Lab 10/19/15 0910 10/20/15 0716 10/21/15 0518  WBC 20.8* 13.5* 11.2*  NEUTROABS 17.7*  --   --   HGB 10.8* 9.7* 9.4*  HCT 30.4* 27.6* 26.5*  MCV 81.9 80.9 80.5  PLT 248 233 073   Basic Metabolic Panel:  Recent Labs Lab 10/19/15 0910 10/20/15 1914 10/21/15 0518  NA 132*  --  134*  K 3.2*  --  3.4*  CL 95*  --  104  CO2 25  --  20*  GLUCOSE 350*  --  124*  BUN 15  --  33*  CREATININE 0.77 3.43* 4.25*  CALCIUM 8.6*  --  7.8*   CBG:  Recent Labs Lab 10/21/15 1139 10/21/15 1708 10/21/15 2132 10/22/15 0750 10/22/15 1140  GLUCAP 148* 135* 94 133* 152*   Urine analysis:    Component Value Date/Time   COLORURINE YELLOW 10/19/2015 1705   APPEARANCEUR CLOUDY (A) 10/19/2015 1705   LABSPEC 1.029 10/19/2015 1705   PHURINE 6.5 10/19/2015 1705   GLUCOSEU >1000 (A) 10/19/2015 1705   HGBUR NEGATIVE 10/19/2015 1705   BILIRUBINUR SMALL (A) 10/19/2015 1705   KETONESUR >80 (A) 10/19/2015 1705   PROTEINUR 30 (A) 10/19/2015 1705   UROBILINOGEN 0.2 09/02/2014 1800   NITRITE NEGATIVE 10/19/2015 1705   LEUKOCYTESUR NEGATIVE 10/19/2015 1705   Recent Results (from the past 240 hour(s))  Blood Culture (routine x 2)     Status: None (Preliminary result)   Collection Time: 10/19/15 10:10 AM  Result Value Ref Range Status   Specimen Description BLOOD LEFT ARM  Final   Special Requests BOTTLES DRAWN AEROBIC AND ANAEROBIC 5ML  Final   Culture   Final    NO GROWTH 3 DAYS Performed at [facility]    Report Status PENDING  Incomplete  Blood Culture (routine x 2)     Status: None (Preliminary result)   Collection Time: 10/19/15 10:24 AM  Result Value Ref Range Status   Specimen Description BLOOD LEFT HAND  Final   Special Requests IN PEDIATRIC BOTTLE 3ML  Final   Culture   Final    NO GROWTH 3 DAYS Performed at [facility]    Report Status PENDING  Incomplete  Aerobic/Anaerobic Culture (surgical/deep wound)     Status: None  (Preliminary result)   Collection Time: 10/19/15  1:49 PM  Result Value Ref Range Status   Specimen Description ABSCESS PRESACRAL  Final   Special Requests NONE  Final   Gram Stain   Final    ABUNDANT WBC PRESENT,BOTH PMN AND MONONUCLEAR ABUNDANT GRAM NEGATIVE RODS  ABUNDANT GRAM POSITIVE COCCI IN PAIRS AND CHAINS FEW GRAM POSITIVE RODS    Culture   Final    ABUNDANT VIRIDANS STREPTOCOCCUS RARE STAPHYLOCOCCUS SPECIES (COAGULASE NEGATIVE) HOLDING FOR POSSIBLE ANAEROBE Performed at [facility]    Report Status PENDING  Incomplete   Organism ID, Bacteria VIRIDANS STREPTOCOCCUS  Final      Susceptibility   Viridans streptococcus - MIC*    PENICILLIN <=0.06 SENSITIVE Sensitive     CEFTRIAXONE <=0.12 SENSITIVE Sensitive     ERYTHROMYCIN <=0.12 SENSITIVE Sensitive     LEVOFLOXACIN 1 SENSITIVE Sensitive     VANCOMYCIN 0.5 SENSITIVE Sensitive     * ABUNDANT VIRIDANS STREPTOCOCCUS  MRSA PCR Screening     Status: Abnormal   Collection Time: 10/19/15  4:12 PM  Result Value Ref Range Status   MRSA by PCR POSITIVE (A) NEGATIVE Final    Comment:        The GeneXpert MRSA Assay (FDA approved for NASAL specimens only), is one component of a comprehensive MRSA colonization surveillance program. It is not intended to diagnose MRSA infection nor to guide or monitor treatment for MRSA infections. RESULT CALLED TO, READ BACK BY AND VERIFIED WITH: DUNKELBERGER,K AT [place] ON [id number] BY MOSLEY,J   Urine culture     Status: None   Collection Time: 10/19/15  5:05 PM  Result Value Ref Range Status   Specimen Description URINE, RANDOM  Final   Special Requests NONE  Final   Culture NO GROWTH Performed at [facility]   Final   Report Status 10/20/2015 FINAL  Final      Radiology Studies: US Renal  Result Date: 10/21/2015 CLINICAL DATA:  Acute renal failure. EXAM: RENAL / URINARY TRACT ULTRASOUND COMPLETE COMPARISON:  None. FINDINGS: Right Kidney: Length: 16.6 cm.  Echogenicity within normal limits. No mass or hydronephrosis visualized. Left Kidney: Length: 15.9 cm. Echogenicity within normal limits. No mass or hydronephrosis visualized. Bladder: Appears normal for degree of bladder distention. IMPRESSION: 1. Relatively large kidneys which may be normal variant for this patient. 2. No other abnormality.  No hydronephrosis. Electronically Signed   By: Lajean Manes M.D.   On: 10/21/2015 12:30      Scheduled Meds: . buPROPion  150 mg Oral Daily  . Chlorhexidine Gluconate Cloth  6 each Topical Q0600  . doxycycline  100 mg Oral Q12H  . enoxaparin (LOVENOX) injection  40 mg Subcutaneous Q24H  . insulin aspart  0-20 Units Subcutaneous TID WC  . insulin aspart  0-5 Units Subcutaneous QHS  . insulin aspart  12 Units Subcutaneous TID WC  . insulin glargine  25 Units Subcutaneous QHS  . mupirocin ointment  1 application Nasal BID  . pantoprazole  40 mg Oral BID  . senna  2 tablet Oral BID  . sertraline  25 mg Oral Daily  . sodium chloride flush  3 mL Intravenous Q12H   Continuous Infusions: . 0.9 % NaCl with KCl 20 mEq / L 75 mL/hr at 10/19/15 1600     LOS: 3 days    Time spent: 20 minutes    Faye Ramsay, MD [facility] Pager [phone]  If 7PM-7AM, please contact night-coverage [email or web address] Password [facility] 10/22/2015, 12:09 PM

## 2015-10-22 NOTE — Progress Notes (Signed)
1 Day Post-Op  Subjective: Asleep, doesn't want to get up now  Objective: Vital signs in last 24 hours: Temp:  [97 F (36.1 C)-98 F (36.7 C)] 97 F (36.1 C) (09/17 0612) Pulse Rate:  [58-68] 64 (09/17 0612) Resp:  [14-21] 16 (09/17 0612) BP: (103-156)/(55-81) 103/56 (09/17 0612) SpO2:  [96 %-100 %] 100 % (09/17 0612) Last BM Date: 10/20/15  Intake/Output from previous day: 09/16 0701 - 09/17 0700 In: 1000 [I.V.:1000] Out: 100 [Urine:100] Intake/Output this shift: No intake/output data recorded.  Dressing in place  Lab Results:   Recent Labs  10/20/15 0716 10/21/15 0518  WBC 13.5* 11.2*  HGB 9.7* 9.4*  HCT 27.6* 26.5*  PLT 233 229   BMET  Recent Labs  10/19/15 0910 10/20/15 1914 10/21/15 0518  NA 132*  --  134*  K 3.2*  --  3.4*  CL 95*  --  104  CO2 25  --  20*  GLUCOSE 350*  --  124*  BUN 15  --  33*  CREATININE 0.77 3.43* 4.25*  CALCIUM 8.6*  --  7.8*   PT/INR No results for input(s): LABPROT, INR in the last 72 hours. ABG No results for input(s): PHART, HCO3 in the last 72 hours.  Invalid input(s): PCO2, PO2  Studies/Results: [place] Renal  Result Date: 10/21/2015 CLINICAL DATA:  Acute renal failure. EXAM: RENAL / URINARY TRACT ULTRASOUND COMPLETE COMPARISON:  None. FINDINGS: Right Kidney: Length: 16.6 cm. Echogenicity within normal limits. No mass or hydronephrosis visualized. Left Kidney: Length: 15.9 cm. Echogenicity within normal limits. No mass or hydronephrosis visualized. Bladder: Appears normal for degree of bladder distention. IMPRESSION: 1. Relatively large kidneys which may be normal variant for this patient. 2. No other abnormality.  No hydronephrosis. Electronically Signed   By: Amie Portlandavid  Ormond M.D.   On: 10/21/2015 12:30    Anti-infectives: Anti-infectives    Start     Dose/Rate Route Frequency Ordered Stop   10/21/15 1200  doxycycline (VIBRA-TABS) tablet 100 mg     100 mg Oral Every 12 hours 10/21/15 0859     10/19/15 2000  vancomycin  (VANCOCIN) 1,250 mg in sodium chloride 0.9 % 250 mL IVPB  Status:  Discontinued     1,250 mg 166.7 mL/hr over 90 Minutes Intravenous Every 8 hours 10/19/15 1255 10/20/15 2014   10/19/15 1800  piperacillin-tazobactam (ZOSYN) IVPB 3.375 g  Status:  Discontinued     3.375 g 12.5 mL/hr over 240 Minutes Intravenous Every 8 hours 10/19/15 1255 10/21/15 0734   10/19/15 1030  vancomycin (VANCOCIN) 2,500 mg in sodium chloride 0.9 % 500 mL IVPB     2,500 mg 250 mL/hr over 120 Minutes Intravenous  Once 10/19/15 1007 10/19/15 1304   10/19/15 1015  piperacillin-tazobactam (ZOSYN) IVPB 3.375 g     3.375 g 100 mL/hr over 30 Minutes Intravenous STAT 10/19/15 1007 10/19/15 1128      Assessment/Plan: Presacral skin and soft tissue infection POD #3 Irrigation and debridement in OR,Dr. Claud KelpHaywood Ingram 10/19/15 -start daily dressing changes, no further need for operative debridement after yesterday ARF- labs pending today   [facility]KE[facility],Shuntell Foody 10/22/2015

## 2015-10-22 NOTE — Progress Notes (Signed)
[facility] ROUNDING NOTE   Subjective:   Interval History: Still appears oliguric  -- morbid obesity making it difficult to assess volume status although vitals are stable  Nephrotoxins stopped yesterday  Objective:  Vital signs in last 24 hours:  Temp:  [97 F (36.1 C)-97.7 F (36.5 C)] 97 F (36.1 C) (09/17 0612) Pulse Rate:  [58-67] 64 (09/17 0612) Resp:  [14-16] 16 (09/17 0612) BP: (103-119)/(56-73) 103/56 (09/17 0612) SpO2:  [99 %-100 %] 100 % (09/17 0612)  Weight change:  Filed Weights   10/19/15 1540 10/19/15 1724  Weight: (!) 139.3 kg (307 lb) (!) 153.3 kg (337 lb 15.4 oz)    Intake/Output: I/O last 3 completed shifts: In: 1003 [I.V.:1003] Out: 100 [Urine:100]   Intake/Output this shift:  No intake/output data recorded.  CVS- RRR RS- CTA ABD- BS present soft non-distended EXT- no edema   Basic Metabolic Panel:  Recent Labs Lab 10/19/15 0910 10/20/15 1914 10/21/15 0518 10/22/15 1238  NA 132*  --  134* 133*  K 3.2*  --  3.4* 3.8  CL 95*  --  104 104  CO2 25  --  20* 15*  GLUCOSE 350*  --  124* 139*  BUN 15  --  33* 42*  CREATININE 0.77 3.43* 4.25* 6.53*  CALCIUM 8.6*  --  7.8* 7.8*    Liver Function Tests: No results for input(s): AST, ALT, ALKPHOS, BILITOT, PROT, ALBUMIN in the last 168 hours. No results for input(s): LIPASE, AMYLASE in the last 168 hours. No results for input(s): AMMONIA in the last 168 hours.  CBC:  Recent Labs Lab 10/19/15 0910 10/20/15 0716 10/21/15 0518  WBC 20.8* 13.5* 11.2*  NEUTROABS 17.7*  --   --   HGB 10.8* 9.7* 9.4*  HCT 30.4* 27.6* 26.5*  MCV 81.9 80.9 80.5  PLT 248 233 229    Cardiac Enzymes: No results for input(s): CKTOTAL, CKMB, CKMBINDEX, TROPONINI in the last 168 hours.  BNP: Invalid input(s): POCBNP  CBG:  Recent Labs Lab 10/21/15 1139 10/21/15 1708 10/21/15 2132 10/22/15 0750 10/22/15 1140  GLUCAP 148* 135* 94 133* 152*    Microbiology: Results for orders placed  or performed during the hospital encounter of 10/19/15  Blood Culture (routine x 2)     Status: None (Preliminary result)   Collection Time: 10/19/15 10:10 AM  Result Value Ref Range Status   Specimen Description BLOOD LEFT ARM  Final   Special Requests BOTTLES DRAWN AEROBIC AND ANAEROBIC 5ML  Final   Culture   Final    NO GROWTH 3 DAYS [facility]    Report Status PENDING  Incomplete  Blood Culture (routine x 2)     Status: None (Preliminary result)   Collection Time: 10/19/15 10:24 AM  Result Value Ref Range Status   Specimen Description BLOOD LEFT HAND  Final   Special Requests IN PEDIATRIC BOTTLE 3ML  Final   Culture   Final    NO GROWTH 3 DAYS [facility]    Report Status PENDING  Incomplete  Aerobic/Anaerobic Culture (surgical/deep wound)     Status: None (Preliminary result)   Collection Time: 10/19/15  1:49 PM  Result Value Ref Range Status   Specimen Description ABSCESS PRESACRAL  Final   Special Requests NONE  Final   Gram Stain   Final    ABUNDANT WBC PRESENT,BOTH PMN AND MONONUCLEAR ABUNDANT GRAM NEGATIVE RODS ABUNDANT GRAM POSITIVE COCCI IN PAIRS AND CHAINS FEW GRAM POSITIVE RODS  Culture   Final    ABUNDANT VIRIDANS STREPTOCOCCUS RARE STAPHYLOCOCCUS SPECIES (COAGULASE NEGATIVE) HOLDING FOR POSSIBLE ANAEROBE Performed at [facility]    Report Status PENDING  Incomplete   Organism ID, Bacteria VIRIDANS STREPTOCOCCUS  Final      Susceptibility   Viridans streptococcus - MIC*    PENICILLIN <=0.06 SENSITIVE Sensitive     CEFTRIAXONE <=0.12 SENSITIVE Sensitive     ERYTHROMYCIN <=0.12 SENSITIVE Sensitive     LEVOFLOXACIN 1 SENSITIVE Sensitive     VANCOMYCIN 0.5 SENSITIVE Sensitive     * ABUNDANT VIRIDANS STREPTOCOCCUS  MRSA PCR Screening     Status: Abnormal   Collection Time: 10/19/15  4:12 PM  Result Value Ref Range Status   MRSA by PCR POSITIVE (A) NEGATIVE Final    Comment:        The GeneXpert MRSA  Assay (FDA approved for NASAL specimens only), is one component of a comprehensive MRSA colonization surveillance program. It is not intended to diagnose MRSA infection nor to guide or monitor treatment for MRSA infections. RESULT CALLED TO, READ BACK BY AND VERIFIED WITH: DUNKELBERGER,K AT 1821 ON [id number] BY MOSLEY,J   Urine culture     Status: None   Collection Time: 10/19/15  5:05 PM  Result Value Ref Range Status   Specimen Description URINE, RANDOM  Final   Special Requests NONE  Final   Culture NO GROWTH Performed at [facility]   Final   Report Status 10/20/2015 FINAL  Final    Coagulation Studies: No results for input(s): LABPROT, INR in the last 72 hours.  Urinalysis:  Recent Labs  10/19/15 1705  COLORURINE YELLOW  LABSPEC 1.029  PHURINE 6.5  GLUCOSEU >1000*  HGBUR NEGATIVE  BILIRUBINUR SMALL*  KETONESUR >80*  PROTEINUR 30*  NITRITE NEGATIVE  LEUKOCYTESUR NEGATIVE      Imaging: US Renal  Result Date: 10/21/2015 CLINICAL DATA:  Acute renal failure. EXAM: RENAL / URINARY TRACT ULTRASOUND COMPLETE COMPARISON:  None. FINDINGS: Right Kidney: Length: 16.6 cm. Echogenicity within normal limits. No mass or hydronephrosis visualized. Left Kidney: Length: 15.9 cm. Echogenicity within normal limits. No mass or hydronephrosis visualized. Bladder: Appears normal for degree of bladder distention. IMPRESSION: 1. Relatively large kidneys which may be normal variant for this patient. 2. No other abnormality.  No hydronephrosis. Electronically Signed   By: Amie Portland M.D.   On: 10/21/2015 12:30     Medications:     . buPROPion  150 mg Oral Daily  . cefTRIAXone (ROCEPHIN)  IV  1 g Intravenous Q24H  . Chlorhexidine Gluconate Cloth  6 each Topical Q0600  . enoxaparin (LOVENOX) injection  40 mg Subcutaneous Q24H  . insulin aspart  0-20 Units Subcutaneous TID WC  . insulin aspart  0-5 Units Subcutaneous QHS  . insulin aspart  12 Units Subcutaneous TID WC  .  insulin glargine  25 Units Subcutaneous QHS  . mupirocin ointment  1 application Nasal BID  . pantoprazole  40 mg Oral BID  . senna  2 tablet Oral BID  . sertraline  25 mg Oral Daily  . sodium chloride flush  3 mL Intravenous Q12H   albuterol, HYDROmorphone (DILAUDID) injection, ondansetron **OR** ondansetron (ZOFRAN) IV, oxyCODONE, promethazine, senna-docusate  Assessment/ Plan:   Acute Kidney Injury   Nephrotoxicity and Necrotizing soft tissue infection - oliguria 44 y.o.femalewith poorly controlled diabetes and morbid obesity. Patient has been seen in the ER 3 times in the last 2 weeks the first time was September 8th,  CT scan was done that did not show a perirectal abscess but did show a questionable soft tissue prominence around the cervix and vagina. Patient was sent home on Keflex at that time saying she was compliant with her medications. Patient was again seen on 10/17/2015 by Dr. Adriana Simas and antibiotic was changed to Bactrim. Patient states the last 2 days she felt worse. Area has been draining and pain has increased  .  Acute Kidney Injury with underlying shock and hypotension, Patient received Vancomycin. No IV contrast enhanced studies  Urinalysis showed > 1000 glucose and some ketones on 9/14  No wbc  Renal ultrasound relatively large kidneys but no hydronephrosis Creatinine is still rising will check CPK in case rhabdomyolysis is playing a role      Monitor Is and Os      Serial renal panels daily      Avoid nephrotoxins      Baseline creatinine normal on admission  - Hypotension          Resolved        Avoid cardiodepressive medications  - Hypokalemia         Replete   - Mild metabolic acidosis         Will follow    LOS: 3 Yukiko Minnich W @TODAY @1 :22 PM

## 2015-10-23 ENCOUNTER — Encounter (HOSPITAL_COMMUNITY): Payer: Self-pay | Admitting: General Surgery

## 2015-10-23 LAB — BASIC METABOLIC PANEL
ANION GAP: 12 (ref 5–15)
BUN: 49 mg/dL — ABNORMAL HIGH (ref 6–20)
CO2: 18 mmol/L — AB (ref 22–32)
Calcium: 8 mg/dL — ABNORMAL LOW (ref 8.9–10.3)
Chloride: 103 mmol/L (ref 101–111)
Creatinine, Ser: 7.84 mg/dL — ABNORMAL HIGH (ref 0.44–1.00)
GFR, EST AFRICAN AMERICAN: 6 mL/min — AB (ref >60)
GFR, EST NON AFRICAN AMERICAN: 6 mL/min — AB (ref >60)
GLUCOSE: 109 mg/dL — AB (ref 65–99)
POTASSIUM: 4.1 mmol/L (ref 3.5–5.1)
Sodium: 133 mmol/L — ABNORMAL LOW (ref 135–145)

## 2015-10-23 LAB — CBC
HEMATOCRIT: 28.7 % — AB (ref 36.0–46.0)
Hemoglobin: 10 g/dL — ABNORMAL LOW (ref 12.0–15.0)
MCH: 28.7 pg (ref 26.0–34.0)
MCHC: 34.8 g/dL (ref 30.0–36.0)
MCV: 82.5 fL (ref 78.0–100.0)
PLATELETS: 221 10*3/uL (ref 150–400)
RBC: 3.48 MIL/uL — AB (ref 3.87–5.11)
RDW: 13.1 % (ref 11.5–15.5)
WBC: 10.9 10*3/uL — AB (ref 4.0–10.5)

## 2015-10-23 LAB — AEROBIC/ANAEROBIC CULTURE (SURGICAL/DEEP WOUND)

## 2015-10-23 LAB — GLUCOSE, CAPILLARY
GLUCOSE-CAPILLARY: 119 mg/dL — AB (ref 65–99)
GLUCOSE-CAPILLARY: 137 mg/dL — AB (ref 65–99)
GLUCOSE-CAPILLARY: 131 mg/dL — AB (ref 65–99)
Glucose-Capillary: 151 mg/dL — ABNORMAL HIGH (ref 65–99)

## 2015-10-23 LAB — AEROBIC/ANAEROBIC CULTURE W GRAM STAIN (SURGICAL/DEEP WOUND)

## 2015-10-23 LAB — CK: Total CK: 8 U/L — ABNORMAL LOW (ref 38–234)

## 2015-10-23 MED ORDER — ENOXAPARIN SODIUM 30 MG/0.3ML ~~LOC~~ SOLN
30.0000 mg | SUBCUTANEOUS | Status: DC
  Administered 2015-10-23: 30 mg via SUBCUTANEOUS
  Filled 2015-10-23: qty 0.3

## 2015-10-23 NOTE — Progress Notes (Signed)
ANTICOAGULATION CONSULT NOTE - Consult  Pharmacy Consult for Lovenox Indication: VTE prophylaxis  Allergies  Allergen Reactions  . Codeine Shortness Of Breath   Patient Measurements: Height: 5\' 7"  (170.2 cm) Weight: (!) 337 lb 15.4 oz (153.3 kg) IBW/kg (Calculated) : 61.6 TBW: 140.6 kg  Vital Signs: Temp: 97.7 F (36.5 C) (09/18 0518) Temp Source: Oral (09/18 0518) BP: 122/69 (09/18 0518) Pulse Rate: 65 (09/18 0518)  Labs:  Recent Labs  10/21/15 0518 10/22/15 1238 10/23/15 0502  HGB 9.4*  --  10.0*  HCT 26.5*  --  28.7*  PLT 229  --  221  CREATININE 4.25* 6.53* 7.84*  CKTOTAL  --   --  8*   Estimated Creatinine Clearance: 13.9 mL/min (by C-G formula based on SCr of 7.84 mg/dL (H)).  Assessment: 45 yo morbidly obese F to ED 9/14 with worsening cellulitis, hx of DM, 2 previous ED visits: 9/8 with cervical/vaginal infection - treated with Keflex, returned 9/12 with abx change to Bactrim. Increased pain, drainage from pre-sacral area, no discrete abscess but wound with multiple channels. Area debrided 15 x 11 x 7 cm (deep), 100 ml blood loss. Repeat dressing change 9/16 w/ no bleeding noted and no further invasive measures required.  Patient with significant AKI.  CrCl~14 ml/min with increasing SCr.  Hgb/plts stable.  Goal of Therapy:  VTE prophylaxis in obese patient  Plan:  Reduce Lovenox to 30 mg SQ q24h for CrCl<30 ml/min. Would consider switching to SQ heparin due to significant AKI.  Clance BollAmanda Arpi Diebold, PharmD, BCPS Pager: [phone] 10/23/2015 11:37 AM

## 2015-10-23 NOTE — Consult Note (Signed)
WOC Nurse wound consult note Reason for Consult:Initial placement of negative pressure wound therapy to large surgical wound perirectal (evacuation of abscess) Pressure Ulcer POA: No Measurement:13.5 x 9cm x 8cm Wound bed: red, dry Drainage (amount, consistency, odor) scant serous Periwound:intact, dry Dressing procedure/placement/frequency: Wound cleansed with NS, patted dry.  4 pieces of black foam used to obliterate dead space, sealed with drape.  Attached to NPWT unit at 125mmHg continuous negative pressure, an immediate seal was achieved.  Patient tolerated procedure well.  Patient does not lie on back (opnly on stomach and side to side) so there is no need to place T.R.A.C. Pad in an alternate location.  Bedside RN may perform this dressing change.  I am happy to offer standby assist as needed. WOC nursing team will not follow routinely, but will remain available to this patient, the nursing and medical and surgical teams.  Please re-consult if needed. Thanks, Ladona MowLaurie Brinly Maietta, MSN, RN, GNP, Hans EdenCWOCN, CWON-AP, FAAN  Pager# [phone]

## 2015-10-23 NOTE — Progress Notes (Signed)
  [facility] Progress Note   Subjective: no c/o, no N/V or hallucination, no jerking. Creat up 7, good UOP.   Vitals:   10/22/15 1404 10/22/15 2014 10/23/15 0518 10/23/15 1439  BP: 137/78 118/61 122/69 (!) 165/88  Pulse: 65 69 65 69  Resp: 18  16 16   Temp: 98 F (36.7 C) 98.3 F (36.8 C) 97.7 F (36.5 C) 98.4 F (36.9 C)  TempSrc: Oral Oral Oral Oral  SpO2: 100% 98% 98% 100%  Weight:      Height:        Inpatient medications: . buPROPion  150 mg Oral Daily  . cefTRIAXone (ROCEPHIN)  IV  1 g Intravenous Q24H  . Chlorhexidine Gluconate Cloth  6 each Topical Q0600  . enoxaparin (LOVENOX) injection  30 mg Subcutaneous Q24H  . insulin aspart  0-20 Units Subcutaneous TID WC  . insulin aspart  0-5 Units Subcutaneous QHS  . insulin aspart  12 Units Subcutaneous TID WC  . insulin glargine  25 Units Subcutaneous QHS  . mupirocin ointment  1 application Nasal BID  . pantoprazole  40 mg Oral BID  . senna  2 tablet Oral BID  . sertraline  25 mg Oral Daily  . sodium chloride flush  3 mL Intravenous Q12H     albuterol, HYDROmorphone (DILAUDID) injection, ondansetron **OR** ondansetron (ZOFRAN) IV, oxyCODONE, promethazine, senna-docusate  Exam: Alert, no distress, lying prone No jvd Chest clear bilat RRR no mrg Abd obese no CVAT Sacral open wound w VAC system in place No LE edema Neuro NF, ox3 no asterixis  BUN 49 Cr 7.85  K 4.1 UOP yest 900 cc UA 9/14 > few bact, 30 prot, 0-5 wbc, 6-30 epis, no RBC, 1.029     Assessment: 1. Acute renal failure - prob ATN d/t sepsis/ hypotension. No contrast this admit 2. Volume - up 12kg, asymptomatic 3. Sacral abscess - sp I&D 4. DM type 2   Plan - no need for HD yet, not uremic yet, cont supportive care.  Making urine, resume IVF's.     Vinson Moselleob Tashea Othman MD [facility] pager [phone]    cell [phone] 10/23/2015, 3:16 PM    Recent Labs Lab 10/21/15 0518 10/22/15 1238 10/23/15 0502  NA 134* 133*  133*  K 3.4* 3.8 4.1  CL 104 104 103  CO2 20* 15* 18*  GLUCOSE 124* 139* 109*  BUN 33* 42* 49*  CREATININE 4.25* 6.53* 7.84*  CALCIUM 7.8* 7.8* 8.0*   No results for input(s): AST, ALT, ALKPHOS, BILITOT, PROT, ALBUMIN in the last 168 hours.  Recent Labs Lab 10/19/15 0910 10/20/15 0716 10/21/15 0518 10/23/15 0502  WBC 20.8* 13.5* 11.2* 10.9*  NEUTROABS 17.7*  --   --   --   HGB 10.8* 9.7* 9.4* 10.0*  HCT 30.4* 27.6* 26.5* 28.7*  MCV 81.9 80.9 80.5 82.5  PLT 248 233 229 221   Iron/TIBC/Ferritin/ %Sat No results found for: IRON, TIBC, FERRITIN, IRONPCTSAT

## 2015-10-23 NOTE — Progress Notes (Signed)
Patient ID: [name], female   DOB: 03/14/1970, 45 y.o.   MRN: [id number]    PROGRESS NOTE    [name]  BTD:[id number] DOB: 01/27/1971 DOA: 10/19/2015  PCP: [name], MD   Brief Narrative:  45 y.o. female with poorly controlled diabetes and morbid obesity.  Patient has been seen in the ER 3 times in the last 2 weeks the first time was September 8th, CT scan was done that did not show a perirectal abscess but did show a questionable soft tissue prominence around the cervix and vagina.  Patient was sent home on Keflex at that time saying she was compliant with her medications. Patient was again seen on 10/17/2015 by Dr. Lacinda Axon and antibiotic was changed to Bactrim.  Patient states the last 2 days she felt worse.  Area has been draining and pain has increased.   ED Course: In the ER patient was found to have a worsening cellulitis with increased white blood cell count from normal to 20.  Patient had blood cultures drawn and started on vancomycin and Zosyn.  Assessment & Plan:   Principal Problem:   Sepsis secondary to Necrotizing soft tissue infection ([employer]) - pt met criteria for sepsis with HR > 90, WBC 20K, source soft tissue infection as outlined below  - large area of induration in the right presacral area,  15 cm. sagitally by 4 cm transversely with some necrotic skin  - pt is s/p I&D of skin, subcutaneous tissue, fascia of presacral soft tissue by Dr. Dalbert Batman, post op day #4 - pt reports feeling better - was on zosyn but wound cultures positive for strep virid., changed abx to rocephin 9/17 - vancomycin stopped due to ARF 9/15 - appreciate surgery team assistance    Active Problems:   Acute kidney injury, oliguria  - unclear if related to vancomycin, this was discontinued 9/15, sepsis as outlined above  - renal US with no acute findings  - pt reported some urine output, overall improving urine output  - Cr still trending up, suspect this will hopefully  start trending down  - BMP in AM - appreciate Dr. Jason Nest assistance     Hypokalemia - supplemented and WNL    Hyponatremia - mild, pre renal in etiology in the setting of sepsis - overall stable - BMP in AM    Diabetes mellitus ([place]), uncontrolled with A1C > 11 - continue Insulin Glargine and SSI     Morbid obesity ([place]) - Body mass index is 52.93 kg/m.  DVT prophylaxis: Lovenox SQ Code Status: Full  Family Communication: Patient at bedside  Disposition Plan: Home once kidney function improved   Consultants:   Surgery   Nephrology   Procedures:   I&D 9/14 -->  Antimicrobials:   Vancomycin 9/14 --> 9/15  Zosyn 9/14 --> 9/17  Doxycycline 9/16 --> 9/17  Rocephin 9/17 -->  Subjective: Better urine output   Objective: Vitals:   10/22/15 0612 10/22/15 1404 10/22/15 2014 10/23/15 0518  BP: (!) 103/56 137/78 118/61 122/69  Pulse: 64 65 69 65  Resp: '16 18  16  ' Temp: 97 F (36.1 C) 98 F (36.7 C) 98.3 F (36.8 C) 97.7 F (36.5 C)  TempSrc: Oral Oral Oral Oral  SpO2: 100% 100% 98% 98%  Weight:      Height:        Intake/Output Summary (Last 24 hours) at 10/23/15 1137 Last data filed at 10/22/15 2004  Gross per 24 hour  Intake  110 ml  Output              250 ml  Net             -140 ml   Filed Weights   10/19/15 1540 10/19/15 1724  Weight: (!) 139.3 kg (307 lb) (!) 153.3 kg (337 lb 15.4 oz)    Examination:  General exam: Appears calm and comfortable  Respiratory system: Clear to auscultation. Respiratory effort normal. Cardiovascular system: S1 & S2 heard, RRR. No JVD, murmurs, rubs, gallops or clicks. No pedal edema. Gastrointestinal system: Abdomen is nondistended, soft and nontender. No organomegaly or masses felt.  Central nervous system: Alert and oriented. No focal neurological deficits.  Data Reviewed: I have personally reviewed following labs and imaging studies  CBC:  Recent Labs Lab 10/19/15 0910 10/20/15 0716  10/21/15 0518 10/23/15 0502  WBC 20.8* 13.5* 11.2* 10.9*  NEUTROABS 17.7*  --   --   --   HGB 10.8* 9.7* 9.4* 10.0*  HCT 30.4* 27.6* 26.5* 28.7*  MCV 81.9 80.9 80.5 82.5  PLT 248 233 229 110   Basic Metabolic Panel:  Recent Labs Lab 10/19/15 0910 10/20/15 1914 10/21/15 0518 10/22/15 1238 10/23/15 0502  NA 132*  --  134* 133* 133*  K 3.2*  --  3.4* 3.8 4.1  CL 95*  --  104 104 103  CO2 25  --  20* 15* 18*  GLUCOSE 350*  --  124* 139* 109*  BUN 15  --  33* 42* 49*  CREATININE 0.77 3.43* 4.25* 6.53* 7.84*  CALCIUM 8.6*  --  7.8* 7.8* 8.0*   CBG:  Recent Labs Lab 10/22/15 0750 10/22/15 1140 10/22/15 1630 10/22/15 2058 10/23/15 0852  GLUCAP 133* 152* 107* 109* 119*   Urine analysis:    Component Value Date/Time   COLORURINE YELLOW 10/19/2015 1705   APPEARANCEUR CLOUDY (A) 10/19/2015 1705   LABSPEC 1.029 10/19/2015 1705   PHURINE 6.5 10/19/2015 1705   GLUCOSEU >1000 (A) 10/19/2015 1705   HGBUR NEGATIVE 10/19/2015 1705   BILIRUBINUR SMALL (A) 10/19/2015 1705   KETONESUR >80 (A) 10/19/2015 1705   PROTEINUR 30 (A) 10/19/2015 1705   UROBILINOGEN 0.2 09/02/2014 1800   NITRITE NEGATIVE 10/19/2015 1705   LEUKOCYTESUR NEGATIVE 10/19/2015 1705   Recent Results (from the past 240 hour(s))  Blood Culture (routine x 2)     Status: None (Preliminary result)   Collection Time: 10/19/15 10:10 AM  Result Value Ref Range Status   Specimen Description BLOOD LEFT ARM  Final   Special Requests BOTTLES DRAWN AEROBIC AND ANAEROBIC 5ML  Final   Culture   Final    NO GROWTH 3 DAYS Performed at [facility]    Report Status PENDING  Incomplete  Blood Culture (routine x 2)     Status: None (Preliminary result)   Collection Time: 10/19/15 10:24 AM  Result Value Ref Range Status   Specimen Description BLOOD LEFT HAND  Final   Special Requests IN PEDIATRIC BOTTLE 3ML  Final   Culture   Final    NO GROWTH 3 DAYS Performed at [facility]    Report Status PENDING   Incomplete  Aerobic/Anaerobic Culture (surgical/deep wound)     Status: None   Collection Time: 10/19/15  1:49 PM  Result Value Ref Range Status   Specimen Description ABSCESS PRESACRAL  Final   Special Requests NONE  Final   Gram Stain   Final    ABUNDANT WBC PRESENT,BOTH PMN AND  MONONUCLEAR ABUNDANT GRAM NEGATIVE RODS ABUNDANT GRAM POSITIVE COCCI IN PAIRS AND CHAINS FEW GRAM POSITIVE RODS    Culture   Final    ABUNDANT VIRIDANS STREPTOCOCCUS RARE STAPHYLOCOCCUS SPECIES (COAGULASE NEGATIVE) ABUNDANT PREVOTELLA BIVIA BETA LACTAMASE POSITIVE Performed at [facility]    Report Status 10/23/2015 FINAL  Final   Organism ID, Bacteria VIRIDANS STREPTOCOCCUS  Final      Susceptibility   Viridans streptococcus - MIC*    PENICILLIN <=0.06 SENSITIVE Sensitive     CEFTRIAXONE <=0.12 SENSITIVE Sensitive     ERYTHROMYCIN <=0.12 SENSITIVE Sensitive     LEVOFLOXACIN 1 SENSITIVE Sensitive     VANCOMYCIN 0.5 SENSITIVE Sensitive     * ABUNDANT VIRIDANS STREPTOCOCCUS  MRSA PCR Screening     Status: Abnormal   Collection Time: 10/19/15  4:12 PM  Result Value Ref Range Status   MRSA by PCR POSITIVE (A) NEGATIVE Final    Comment:        The GeneXpert MRSA Assay (FDA approved for NASAL specimens only), is one component of a comprehensive MRSA colonization surveillance program. It is not intended to diagnose MRSA infection nor to guide or monitor treatment for MRSA infections. RESULT CALLED TO, READ BACK BY AND VERIFIED WITH: DUNKELBERGER,K AT [place] [id number] BY MOSLEY,J   Urine culture     Status: None   Collection Time: 10/19/15  5:05 PM  Result Value Ref Range Status   Specimen Description URINE, RANDOM  Final   Special Requests NONE  Final   Culture NO GROWTH Performed at [facility]   Final   Report Status 10/20/2015 FINAL  Final      Radiology Studies: US Renal  Result Date: 10/21/2015 CLINICAL DATA:  Acute renal failure. EXAM: RENAL / URINARY TRACT  ULTRASOUND COMPLETE COMPARISON:  None. FINDINGS: Right Kidney: Length: 16.6 cm. Echogenicity within normal limits. No mass or hydronephrosis visualized. Left Kidney: Length: 15.9 cm. Echogenicity within normal limits. No mass or hydronephrosis visualized. Bladder: Appears normal for degree of bladder distention. IMPRESSION: 1. Relatively large kidneys which may be normal variant for this patient. 2. No other abnormality.  No hydronephrosis. Electronically Signed   By: Lajean Manes M.D.   On: 10/21/2015 12:30      Scheduled Meds: . buPROPion  150 mg Oral Daily  . cefTRIAXone (ROCEPHIN)  IV  1 g Intravenous Q24H  . Chlorhexidine Gluconate Cloth  6 each Topical Q0600  . enoxaparin (LOVENOX) injection  30 mg Subcutaneous Q24H  . insulin aspart  0-20 Units Subcutaneous TID WC  . insulin aspart  0-5 Units Subcutaneous QHS  . insulin aspart  12 Units Subcutaneous TID WC  . insulin glargine  25 Units Subcutaneous QHS  . mupirocin ointment  1 application Nasal BID  . pantoprazole  40 mg Oral BID  . senna  2 tablet Oral BID  . sertraline  25 mg Oral Daily  . sodium chloride flush  3 mL Intravenous Q12H   Continuous Infusions:     LOS: 4 days    Time spent: 20 minutes    Faye Ramsay, MD [facility] Pager [phone]  If 7PM-7AM, please contact night-coverage [email or web address] Password [facility] 10/23/2015, 11:37 AM

## 2015-10-23 NOTE — Progress Notes (Signed)
2 Days Post-Op  Subjective: No complaints  Objective: Vital signs in last 24 hours: Temp:  [97.7 F (36.5 C)-98.3 F (36.8 C)] 97.7 F (36.5 C) (09/18 0518) Pulse Rate:  [65-69] 65 (09/18 0518) Resp:  [16-18] 16 (09/18 0518) BP: (118-137)/(61-78) 122/69 (09/18 0518) SpO2:  [98 %-100 %] 98 % (09/18 0518) Last BM Date: 10/20/15  Intake/Output from previous day: 09/17 0701 - 09/18 0700 In: 170 [P.O.:120; IV Piggyback:50] Out: 450 [Urine:450] Intake/Output this shift: No intake/output data recorded.  Incision/Wound: deep sacral wound, clean without infection, repacked today  Lab Results:   Recent Labs  10/21/15 0518 10/23/15 0502  WBC 11.2* 10.9*  HGB 9.4* 10.0*  HCT 26.5* 28.7*  PLT 229 221   BMET  Recent Labs  10/22/15 1238 10/23/15 0502  NA 133* 133*  K 3.8 4.1  CL 104 103  CO2 15* 18*  GLUCOSE 139* 109*  BUN 42* 49*  CREATININE 6.53* 7.84*  CALCIUM 7.8* 8.0*   PT/INR No results for input(s): LABPROT, INR in the last 72 hours. ABG No results for input(s): PHART, HCO3 in the last 72 hours.  Invalid input(s): PCO2, PO2  Studies/Results: [place] Renal  Result Date: 10/21/2015 CLINICAL DATA:  Acute renal failure. EXAM: RENAL / URINARY TRACT ULTRASOUND COMPLETE COMPARISON:  None. FINDINGS: Right Kidney: Length: 16.6 cm. Echogenicity within normal limits. No mass or hydronephrosis visualized. Left Kidney: Length: 15.9 cm. Echogenicity within normal limits. No mass or hydronephrosis visualized. Bladder: Appears normal for degree of bladder distention. IMPRESSION: 1. Relatively large kidneys which may be normal variant for this patient. 2. No other abnormality.  No hydronephrosis. Electronically Signed   By: Amie Portlandavid  Ormond M.D.   On: 10/21/2015 12:30    Anti-infectives: Anti-infectives    Start     Dose/Rate Route Frequency Ordered Stop   10/22/15 1300  cefTRIAXone (ROCEPHIN) 1 g in dextrose 5 % 50 mL IVPB     1 g 100 mL/hr over 30 Minutes Intravenous Every 24  hours 10/22/15 1245     10/21/15 1200  doxycycline (VIBRA-TABS) tablet 100 mg  Status:  Discontinued     100 mg Oral Every 12 hours 10/21/15 0859 10/22/15 1246   10/19/15 2000  vancomycin (VANCOCIN) 1,250 mg in sodium chloride 0.9 % 250 mL IVPB  Status:  Discontinued     1,250 mg 166.7 mL/hr over 90 Minutes Intravenous Every 8 hours 10/19/15 1255 10/20/15 2014   10/19/15 1800  piperacillin-tazobactam (ZOSYN) IVPB 3.375 g  Status:  Discontinued     3.375 g 12.5 mL/hr over 240 Minutes Intravenous Every 8 hours 10/19/15 1255 10/21/15 0734   10/19/15 1030  vancomycin (VANCOCIN) 2,500 mg in sodium chloride 0.9 % 500 mL IVPB     2,500 mg 250 mL/hr over 120 Minutes Intravenous  Once 10/19/15 1007 10/19/15 1304   10/19/15 1015  piperacillin-tazobactam (ZOSYN) IVPB 3.375 g     3.375 g 100 mL/hr over 30 Minutes Intravenous STAT 10/19/15 1007 10/19/15 1128      Assessment/Plan: Presacral skin and soft tissue infection POD #4 Irrigation and debridement in OR,Dr. Claud KelpHaywood Ingram 10/19/15 -daily dressing changes, will see if can place vac ARF   [facility],Ngoc Detjen 10/23/2015

## 2015-10-24 LAB — CULTURE, BLOOD (ROUTINE X 2)
CULTURE: NO GROWTH
Culture: NO GROWTH

## 2015-10-24 LAB — BASIC METABOLIC PANEL
ANION GAP: 15 (ref 5–15)
BUN: 56 mg/dL — ABNORMAL HIGH (ref 6–20)
CHLORIDE: 104 mmol/L (ref 101–111)
CO2: 14 mmol/L — ABNORMAL LOW (ref 22–32)
Calcium: 8 mg/dL — ABNORMAL LOW (ref 8.9–10.3)
Creatinine, Ser: 9.53 mg/dL — ABNORMAL HIGH (ref 0.44–1.00)
GFR, EST AFRICAN AMERICAN: 5 mL/min — AB (ref >60)
GFR, EST NON AFRICAN AMERICAN: 4 mL/min — AB (ref >60)
Glucose, Bld: 160 mg/dL — ABNORMAL HIGH (ref 65–99)
POTASSIUM: 4.5 mmol/L (ref 3.5–5.1)
SODIUM: 133 mmol/L — AB (ref 135–145)

## 2015-10-24 LAB — GLUCOSE, CAPILLARY
GLUCOSE-CAPILLARY: 161 mg/dL — AB (ref 65–99)
GLUCOSE-CAPILLARY: 163 mg/dL — AB (ref 65–99)
Glucose-Capillary: 151 mg/dL — ABNORMAL HIGH (ref 65–99)
Glucose-Capillary: 105 mg/dL — ABNORMAL HIGH (ref 65–99)

## 2015-10-24 LAB — CBC
HCT: 28.3 % — ABNORMAL LOW (ref 36.0–46.0)
HEMOGLOBIN: 10.1 g/dL — AB (ref 12.0–15.0)
MCH: 28.8 pg (ref 26.0–34.0)
MCHC: 35.7 g/dL (ref 30.0–36.0)
MCV: 80.6 fL (ref 78.0–100.0)
PLATELETS: 197 10*3/uL (ref 150–400)
RBC: 3.51 MIL/uL — AB (ref 3.87–5.11)
RDW: 13.2 % (ref 11.5–15.5)
WBC: 10.5 10*3/uL (ref 4.0–10.5)

## 2015-10-24 MED ORDER — HEPARIN SODIUM (PORCINE) 5000 UNIT/ML IJ SOLN
5000.0000 [IU] | Freq: Three times a day (TID) | INTRAMUSCULAR | Status: DC
  Administered 2015-10-24 – 2015-11-03 (×17): 5000 [IU] via SUBCUTANEOUS
  Filled 2015-10-24 (×15): qty 1

## 2015-10-24 NOTE — Progress Notes (Signed)
[facility] Pt alert, VSS, does not appear to be in distress. Pt sent with belongings and wound vac.

## 2015-10-24 NOTE — Progress Notes (Signed)
Patient ID: [name], female   DOB: [birth year], 45 y.o.   MRN: [id number]  [facility]  3 Days Post-Op  Subjective: No complaints. Tired and resting.  Objective: Vital signs in last 24 hours: Temp:  [98.1 F (36.7 C)-98.4 F (36.9 C)] 98.1 F (36.7 C) (09/19 0411) Pulse Rate:  [68-71] 68 (09/19 0411) Resp:  [16] 16 (09/19 0411) BP: (120-165)/(54-88) 120/54 (09/19 0411) SpO2:  [98 %-100 %] 98 % (09/19 0411) Weight:  [349 lb 4.8 oz (158.4 kg)] 349 lb 4.8 oz (158.4 kg) (09/19 0613) Last BM Date: 10/20/15  Intake/Output from previous day: 09/18 0701 - 09/19 0700 In: 53 [I.V.:3; IV Piggyback:50] Out: 750 [Urine:700; Emesis/NG output:50] Intake/Output this shift: No intake/output data recorded.  PE: Gen:  Tired, NAD Abd: Soft, NT Wound: vac in place over presacral wound and working well  Lab Results:   Recent Labs  10/23/15 0502 10/24/15 0519  WBC 10.9* 10.5  HGB 10.0* 10.1*  HCT 28.7* 28.3*  PLT 221 197   BMET  Recent Labs  10/23/15 0502 10/24/15 0519  NA 133* 133*  K 4.1 4.5  CL 103 104  CO2 18* 14*  GLUCOSE 109* 160*  BUN 49* 56*  CREATININE 7.84* 9.53*  CALCIUM 8.0* 8.0*   PT/INR No results for input(s): LABPROT, INR in the last 72 hours. CMP     Component Value Date/Time   NA 133 (L) 10/24/2015 0519   NA 138 05/05/2013 1649   K 4.5 10/24/2015 0519   CL 104 10/24/2015 0519   CO2 14 (L) 10/24/2015 0519   GLUCOSE 160 (H) 10/24/2015 0519   BUN 56 (H) 10/24/2015 0519   BUN 11 05/05/2013 1649   CREATININE 9.53 (H) 10/24/2015 0519   CALCIUM 8.0 (L) 10/24/2015 0519   PROT 7.5 10/13/2015 1554   PROT 7.7 05/05/2013 1649   ALBUMIN 3.1 (L) 10/13/2015 1554   ALBUMIN 3.7 05/05/2013 1649   AST 33 10/13/2015 1554   ALT 25 10/13/2015 1554   ALKPHOS 123 10/13/2015 1554   BILITOT 0.7 10/13/2015 1554   GFRNONAA 4 (L) 10/24/2015 0519   GFRAA 5 (L) 10/24/2015 0519   Lipase     Component Value Date/Time   LIPASE 18  (L) 09/02/2014 1743       Studies/Results: No results found.  Anti-infectives: Anti-infectives    Start     Dose/Rate Route Frequency Ordered Stop   10/22/15 1300  cefTRIAXone (ROCEPHIN) 1 g in dextrose 5 % 50 mL IVPB     1 g 100 mL/hr over 30 Minutes Intravenous Every 24 hours 10/22/15 1245     10/21/15 1200  doxycycline (VIBRA-TABS) tablet 100 mg  Status:  Discontinued     100 mg Oral Every 12 hours 10/21/15 0859 10/22/15 1246   10/19/15 2000  vancomycin (VANCOCIN) 1,250 mg in sodium chloride 0.9 % 250 mL IVPB  Status:  Discontinued     1,250 mg 166.7 mL/hr over 90 Minutes Intravenous Every 8 hours 10/19/15 1255 10/20/15 2014   10/19/15 1800  piperacillin-tazobactam (ZOSYN) IVPB 3.375 g  Status:  Discontinued     3.375 g 12.5 mL/hr over 240 Minutes Intravenous Every 8 hours 10/19/15 1255 10/21/15 0734   10/19/15 1030  vancomycin (VANCOCIN) 2,500 mg in sodium chloride 0.9 % 500 mL IVPB     2,500 mg 250 mL/hr over 120 Minutes Intravenous  Once 10/19/15 1007 10/19/15 1304   10/19/15 1015  piperacillin-tazobactam (ZOSYN) IVPB 3.375 g  3.375 g 100 mL/hr over 30 Minutes Intravenous STAT 10/19/15 1007 10/19/15 1128       Assessment/Plan Presacral skin and soft tissue infection POD #5 Irrigation and debridement in OR,Dr. Claud Kelp 10/19/15 -vac placed yesterday by WOC, will need vac changes MWF ARF  ID - zosyn 9/14>>9/16, vanc 9/14>>9/15, rocephin 9/17>> FEN - carb modified VTE - lovenox   Plan - plan vac changes MWF   LOS: 5 days    Edson Snowball , [facility] 10/24/2015, 7:33 AM Pager: [phone] Consults: [phone] Mon-Fri 7:00 am-4:30 pm Sat-Sun 7:00 am-11:30 am

## 2015-10-24 NOTE — Progress Notes (Addendum)
Patient ID: [name], female   DOB: 1970/03/25, 45 y.o.   MRN: [id number]    PROGRESS NOTE    [name]  BCW:[id number] DOB: 04/10/1970 DOA: 10/19/2015  PCP: Madeline Bill, MD   Brief Narrative:  45 y.o. female with poorly controlled diabetes and morbid obesity.  Patient has been seen in the ER 3 times in the last 2 weeks the first time was September 8th, CT scan was done that did not show a perirectal abscess but did show a questionable soft tissue prominence around the cervix and vagina.  Patient was sent home on Keflex at that time saying she was compliant with her medications. Patient was again seen on 10/17/2015 by Dr. Lacinda Price and antibiotic was changed to Bactrim.  Patient states the last 2 days she felt worse.  Area has been draining and pain has increased.   ED Course: In the ER patient was found to have a worsening cellulitis with increased white blood cell count from normal to 20.  Patient had blood cultures drawn and started on vancomycin and Zosyn.  During hospital stay, kidney function deteriorated and exact etiology remains unclear, nephrology team consulted for assistance and recommended transfer to [facility] in case pt will require HD.   Assessment & Plan:   Principal Problem:   Septic shock, sepsis secondary to Necrotizing soft tissue infection ([place]) - pt met criteria for sepsis with HR > 90, WBC 20K, source soft tissue infection as outlined below  - large area of induration in the right presacral area,  15 cm. sagitally by 4 cm transversely with some necrotic skin  - pt is s/p I&D of skin, subcutaneous tissue, fascia of presacral soft tissue by Dr. Dalbert Price, post op day #5, vac in place now  - was on zosyn but wound cultures positive for Strep virid.and Prevotella, changed abx to rocephin 9/17 - vancomycin stopped due to ARF 9/15 - appreciate surgery team assistance    Active Problems:   Acute kidney injury, oliguria  - unclear if related to vancomycin,  this was discontinued 9/15, sepsis as outlined above  - renal US with no acute findings  - total output in pas 24 hours ~ 750 cc - Cr still trending up, per nephrology --> stop IVF, plan to transfer to [facility] due to potential need for HD - swill stop Lovenox SQ and place on Heparin SQ for DVT prophylaxis     Hypokalemia - supplemented and WNL    Hyponatremia - overall stable - BMP in AM    Diabetes mellitus ([place]), uncontrolled with A1C > 11 - continue Insulin Glargine and SSI     Morbid obesity ([place]) - Body mass index is 54.71 kg/m.  DVT prophylaxis: Heparin SQ Code Status: Full  Family Communication: Patient at bedside  Disposition Plan: Home once kidney function improved   Consultants:   Surgery   Nephrology   Procedures:   I&D 9/14 -->  Antimicrobials:   Vancomycin 9/14 --> 9/15  Zosyn 9/14 --> 9/17  Doxycycline 9/16 --> 9/17  Rocephin 9/17 -->  Subjective: Better urine output   Objective: Vitals:   10/23/15 2102 10/24/15 0411 10/24/15 0613 10/24/15 1515  BP: 120/71 (!) 120/54  (!) 144/70  Pulse: 71 68  77  Resp: _0 Temp: 98.4 F (36.9 C) 98.1 F (36.7 C)  98.4 F (36.9 C)  TempSrc: Oral Oral  Oral  SpO2: 100% 98%  98%  Weight:   (!) 158.4 kg (349  lb 4.8 oz)   Height:        Intake/Output Summary (Last 24 hours) at 10/24/15 1643 Last data filed at 10/24/15 1627  Gross per 24 hour  Intake              360 ml  Output              750 ml  Net             -390 ml   Filed Weights   10/19/15 1540 10/19/15 1724 10/24/15 0613  Weight: (!) 139.3 kg (307 lb) (!) 153.3 kg (337 lb 15.4 oz) (!) 158.4 kg (349 lb 4.8 oz)    Examination:  General exam: Appears calm and comfortable  Respiratory system: Clear to auscultation. Respiratory effort normal. Cardiovascular system: S1 & S2 heard, RRR. No JVD, murmurs, rubs, gallops or clicks. No pedal edema. Gastrointestinal system: Abdomen is nondistended, soft and nontender. No organomegaly or masses  felt.  Central nervous system: Alert and oriented. No focal neurological deficits.  Data Reviewed: I have personally reviewed following labs and imaging studies  CBC:  Recent Labs Lab 10/19/15 0910 10/20/15 0716 10/21/15 0518 10/23/15 0502 10/24/15 0519  WBC 20.8* 13.5* 11.2* 10.9* 10.5  NEUTROABS 17.7*  --   --   --   --   HGB 10.8* 9.7* 9.4* 10.0* 10.1*  HCT 30.4* 27.6* 26.5* 28.7* 28.3*  MCV 81.9 80.9 80.5 82.5 80.6  PLT 248 233 229 221 973   Basic Metabolic Panel:  Recent Labs Lab 10/19/15 0910 10/20/15 1914 10/21/15 0518 10/22/15 1238 10/23/15 0502 10/24/15 0519  NA 132*  --  134* 133* 133* 133*  K 3.2*  --  3.4* 3.8 4.1 4.5  CL 95*  --  104 104 103 104  CO2 25  --  20* 15* 18* 14*  GLUCOSE 350*  --  124* 139* 109* 160*  BUN 15  --  33* 42* 49* 56*  CREATININE 0.77 3.43* 4.25* 6.53* 7.84* 9.53*  CALCIUM 8.6*  --  7.8* 7.8* 8.0* 8.0*   CBG:  Recent Labs Lab 10/23/15 1158 10/23/15 1757 10/23/15 2111 10/24/15 0802 10/24/15 1242  GLUCAP 137* 131* 151* 161* 163*   Urine analysis:    Component Value Date/Time   COLORURINE YELLOW 10/19/2015 1705   APPEARANCEUR CLOUDY (A) 10/19/2015 1705   LABSPEC 1.029 10/19/2015 1705   PHURINE 6.5 10/19/2015 1705   GLUCOSEU >1000 (A) 10/19/2015 1705   HGBUR NEGATIVE 10/19/2015 1705   BILIRUBINUR SMALL (A) 10/19/2015 1705   KETONESUR >80 (A) 10/19/2015 1705   PROTEINUR 30 (A) 10/19/2015 1705   UROBILINOGEN 0.2 09/02/2014 1800   NITRITE NEGATIVE 10/19/2015 1705   LEUKOCYTESUR NEGATIVE 10/19/2015 1705   Recent Results (from the past 240 hour(s))  Blood Culture (routine x 2)     Status: None   Collection Time: 10/19/15 10:10 AM  Result Value Ref Range Status   Specimen Description BLOOD LEFT ARM  Final   Special Requests BOTTLES DRAWN AEROBIC AND ANAEROBIC 5ML  Final   Culture   Final    NO GROWTH 5 DAYS Performed at [facility]    Report Status 10/24/2015 FINAL  Final  Blood Culture (routine x 2)      Status: None   Collection Time: 10/19/15 10:24 AM  Result Value Ref Range Status   Specimen Description BLOOD LEFT HAND  Final   Special Requests IN PEDIATRIC BOTTLE 3ML  Final   Culture   Final  NO GROWTH 5 DAYS Performed at [facility]    Report Status 10/24/2015 FINAL  Final  Aerobic/Anaerobic Culture (surgical/deep wound)     Status: None   Collection Time: 10/19/15  1:49 PM  Result Value Ref Range Status   Specimen Description ABSCESS PRESACRAL  Final   Special Requests NONE  Final   Gram Stain   Final    ABUNDANT WBC PRESENT,BOTH PMN AND MONONUCLEAR ABUNDANT GRAM NEGATIVE RODS ABUNDANT GRAM POSITIVE COCCI IN PAIRS AND CHAINS FEW GRAM POSITIVE RODS    Culture   Final    ABUNDANT VIRIDANS STREPTOCOCCUS RARE STAPHYLOCOCCUS SPECIES (COAGULASE NEGATIVE) ABUNDANT PREVOTELLA BIVIA BETA LACTAMASE POSITIVE Performed at [facility]    Report Status 10/23/2015 FINAL  Final   Organism ID, Bacteria VIRIDANS STREPTOCOCCUS  Final      Susceptibility   Viridans streptococcus - MIC*    PENICILLIN <=0.06 SENSITIVE Sensitive     CEFTRIAXONE <=0.12 SENSITIVE Sensitive     ERYTHROMYCIN <=0.12 SENSITIVE Sensitive     LEVOFLOXACIN 1 SENSITIVE Sensitive     VANCOMYCIN 0.5 SENSITIVE Sensitive     * ABUNDANT VIRIDANS STREPTOCOCCUS  MRSA PCR Screening     Status: Abnormal   Collection Time: 10/19/15  4:12 PM  Result Value Ref Range Status   MRSA by PCR POSITIVE (A) NEGATIVE Final    Comment:        The GeneXpert MRSA Assay (FDA approved for NASAL specimens only), is one component of a comprehensive MRSA colonization surveillance program. It is not intended to diagnose MRSA infection nor to guide or monitor treatment for MRSA infections. RESULT CALLED TO, READ BACK BY AND VERIFIED WITH: DUNKELBERGER,K AT [place] [id number] BY MOSLEY,J   Urine culture     Status: None   Collection Time: 10/19/15  5:05 PM  Result Value Ref Range Status   Specimen Description URINE,  RANDOM  Final   Special Requests NONE  Final   Culture NO GROWTH Performed at [facility]   Final   Report Status 10/20/2015 FINAL  Final      Radiology Studies: No results found.    Scheduled Meds: . buPROPion  150 mg Oral Daily  . cefTRIAXone (ROCEPHIN)  IV  1 g Intravenous Q24H  . enoxaparin (LOVENOX) injection  30 mg Subcutaneous Q24H  . insulin aspart  0-20 Units Subcutaneous TID WC  . insulin aspart  0-5 Units Subcutaneous QHS  . insulin aspart  12 Units Subcutaneous TID WC  . insulin glargine  25 Units Subcutaneous QHS  . mupirocin ointment  1 application Nasal BID  . pantoprazole  40 mg Oral BID  . senna  2 tablet Oral BID  . sertraline  25 mg Oral Daily  . sodium chloride flush  3 mL Intravenous Q12H   Continuous Infusions:     LOS: 5 days    Time spent: 20 minutes    Faye Ramsay, MD [facility] Pager [phone]  If 7PM-7AM, please contact night-coverage [email or web address] Password [facility] 10/24/2015, 4:43 PM

## 2015-10-24 NOTE — Progress Notes (Signed)
New Transfer Note:   [facility] Mental Orientation: Alert and oriented x4 Telemetry: N/A Assessment: Completed Skin: See doc flowsheet IV: L Hand-NSL Pain: Denies Tubes: Wound VAC to sacral wound Safety Measures: Safety Fall Prevention Plan has been given, discussed. [address] [place] Orientation: Patient has been orientated to the room, unit and staff.  Family: None at bedside  Orders have been reviewed and implemented. Will continue to monitor the patient. Call light has been placed within reach and bed alarm has been activated.   Toll BrothersCharito Ianna Salmela BSN, RN-BC Phone number: [phone]

## 2015-10-24 NOTE — Progress Notes (Signed)
  [facility] Progress Note   Subjective: good UOP, some nausea, no vomiting, creat up 9.5 today  Vitals:   10/23/15 2102 10/24/15 0411 10/24/15 0613 10/24/15 1515  BP: 120/71 (!) 120/54  (!) 144/70  Pulse: 71 68  77  Resp: 16 16  18   Temp: 98.4 F (36.9 C) 98.1 F (36.7 C)  98.4 F (36.9 C)  TempSrc: Oral Oral  Oral  SpO2: 100% 98%  98%  Weight:   (!) 158.4 kg (349 lb 4.8 oz)   Height:        Inpatient medications: . buPROPion  150 mg Oral Daily  . cefTRIAXone (ROCEPHIN)  IV  1 g Intravenous Q24H  . enoxaparin (LOVENOX) injection  30 mg Subcutaneous Q24H  . insulin aspart  0-20 Units Subcutaneous TID WC  . insulin aspart  0-5 Units Subcutaneous QHS  . insulin aspart  12 Units Subcutaneous TID WC  . insulin glargine  25 Units Subcutaneous QHS  . mupirocin ointment  1 application Nasal BID  . pantoprazole  40 mg Oral BID  . senna  2 tablet Oral BID  . sertraline  25 mg Oral Daily  . sodium chloride flush  3 mL Intravenous Q12H     albuterol, HYDROmorphone (DILAUDID) injection, ondansetron **OR** ondansetron (ZOFRAN) IV, oxyCODONE, promethazine, senna-docusate  Exam: Alert, no distress, lying prone No jvd Chest clear bilat RRR no mrg Abd obese no CVAT Sacral open wound w VAC system in place No LE edema, no UE edema Neuro NF, ox3 no asterixis   Renal US - 16/ 16 cm kidneys, no hydro UA 9/14 > few bact, 30 prot, 0-5 wbc, 6-30 epis, no RBC, 1.029     Assessment: 1. Acute renal failure - prob ATN d/t sepsis/ hypotension. No contrast this admission. Nonoliguric. Vol status good, but azotemia is worsening . [facility] next 24-48 hrs.  Wt's up further , will stop IVF's.   2. Volume - up 19 kg, asymptomatic 3. Sacral abscess - sp I&D 4. DM type 2   Plan - as above.      Vinson Moselleob Anselma Herbel MD [facility] pager [phone]    cell [phone] 10/24/2015, 4:31 PM    Recent Labs Lab 10/22/15 1238  10/23/15 0502 10/24/15 0519  NA 133* 133* 133*  K 3.8 4.1 4.5  CL 104 103 104  CO2 15* 18* 14*  GLUCOSE 139* 109* 160*  BUN 42* 49* 56*  CREATININE 6.53* 7.84* 9.53*  CALCIUM 7.8* 8.0* 8.0*   No results for input(s): AST, ALT, ALKPHOS, BILITOT, PROT, ALBUMIN in the last 168 hours.  Recent Labs Lab 10/19/15 0910  10/21/15 0518 10/23/15 0502 10/24/15 0519  WBC 20.8*  < > 11.2* 10.9* 10.5  NEUTROABS 17.7*  --   --   --   --   HGB 10.8*  < > 9.4* 10.0* 10.1*  HCT 30.4*  < > 26.5* 28.7* 28.3*  MCV 81.9  < > 80.5 82.5 80.6  PLT 248  < > 229 221 197  < > = values in this interval not displayed. Iron/TIBC/Ferritin/ %Sat No results found for: IRON, TIBC, FERRITIN, IRONPCTSAT

## 2015-10-25 DIAGNOSIS — E1365 Other specified diabetes mellitus with hyperglycemia: Secondary | ICD-10-CM

## 2015-10-25 DIAGNOSIS — Z794 Long term (current) use of insulin: Secondary | ICD-10-CM

## 2015-10-25 LAB — GLUCOSE, CAPILLARY
GLUCOSE-CAPILLARY: 114 mg/dL — AB (ref 65–99)
GLUCOSE-CAPILLARY: 129 mg/dL — AB (ref 65–99)
Glucose-Capillary: 109 mg/dL — ABNORMAL HIGH (ref 65–99)
Glucose-Capillary: 107 mg/dL — ABNORMAL HIGH (ref 65–99)

## 2015-10-25 LAB — BASIC METABOLIC PANEL
Anion gap: 13 (ref 5–15)
BUN: 58 mg/dL — AB (ref 6–20)
CHLORIDE: 104 mmol/L (ref 101–111)
CO2: 16 mmol/L — AB (ref 22–32)
Calcium: 8.1 mg/dL — ABNORMAL LOW (ref 8.9–10.3)
Creatinine, Ser: 11.36 mg/dL — ABNORMAL HIGH (ref 0.44–1.00)
GFR calc non Af Amer: 4 mL/min — ABNORMAL LOW (ref >60)
GFR, EST AFRICAN AMERICAN: 4 mL/min — AB (ref >60)
GLUCOSE: 99 mg/dL (ref 65–99)
Potassium: 4.2 mmol/L (ref 3.5–5.1)
SODIUM: 133 mmol/L — AB (ref 135–145)

## 2015-10-25 MED ORDER — DIPHENHYDRAMINE HCL 25 MG PO CAPS
25.0000 mg | ORAL_CAPSULE | Freq: Four times a day (QID) | ORAL | Status: DC | PRN
  Administered 2015-10-25 – 2015-11-02 (×5): 25 mg via ORAL
  Filled 2015-10-25 (×7): qty 1

## 2015-10-25 MED ORDER — NYSTATIN 100000 UNIT/ML MT SUSP
5.0000 mL | Freq: Four times a day (QID) | OROMUCOSAL | Status: DC
  Administered 2015-10-25 – 2015-11-03 (×33): 500000 [IU] via ORAL
  Filled 2015-10-25 (×31): qty 5

## 2015-10-25 MED ORDER — AMOXICILLIN-POT CLAVULANATE 500-125 MG PO TABS
1.0000 | ORAL_TABLET | ORAL | Status: AC
  Administered 2015-10-25 – 2015-10-31 (×7): 500 mg via ORAL
  Filled 2015-10-25 (×7): qty 1

## 2015-10-25 MED ORDER — PHENOL 1.4 % MT LIQD: 1.0000 | OROMUCOSAL | Status: DC | PRN

## 2015-10-25 NOTE — Progress Notes (Signed)
  [facility] Progress Note   Subjective: good UOP, no nausea or SOB or vomiting, creat up 11 today  Vitals:   10/24/15 1515 10/24/15 2044 10/25/15 0425 10/25/15 0849  BP: (!) 144/70 (!) 129/35 (!) 123/43 (!) 115/50  Pulse: 77 75 73 73  Resp: 18 18 16 18   Temp: 98.4 F (36.9 C) 99.1 F (37.3 C) 99 F (37.2 C) 98.3 F (36.8 C)  TempSrc: Oral Oral Oral Oral  SpO2: 98% 100% 98% 92%  Weight:  (!) 162.7 kg (358 lb 11 oz)    Height:  5\' 7"  (1.702 m)      Inpatient medications: . amoxicillin-clavulanate  1 tablet Oral Q24H  . buPROPion  150 mg Oral Daily  . heparin subcutaneous  5,000 Units Subcutaneous Q8H  . insulin aspart  0-20 Units Subcutaneous TID WC  . insulin aspart  0-5 Units Subcutaneous QHS  . insulin aspart  12 Units Subcutaneous TID WC  . insulin glargine  25 Units Subcutaneous QHS  . pantoprazole  40 mg Oral BID  . senna  2 tablet Oral BID  . sertraline  25 mg Oral Daily  . sodium chloride flush  3 mL Intravenous Q12H     albuterol, HYDROmorphone (DILAUDID) injection, ondansetron **OR** ondansetron (ZOFRAN) IV, oxyCODONE, promethazine, senna-docusate  Exam: Alert, no distress, lying prone, stood up to weigh at 156kg No jvd Chest clear bilat RRR no mrg Abd obese no CVAT Sacral open wound w VAC system in place No LE edema, no UE edema Neuro NF, ox3 no asterixis   Renal US - 16/ 16 cm kidneys, no hydro UA 9/14 > few bact, 30 prot, 0-5 wbc, 6-30 epis, no RBC, 1.029     Assessment: 1. Acute renal failure - prob ATN d/t sepsis/ hypotension. No contrast this admission. Nonoliguric. Vol status good, but azotemia is worsening . Probable ATN.  Not recovering yet.  BP's soft.  Will resume IVF's. Renal US 9/16 no hydro.  Check PVR today, too big for bladder scan to r/o urinary retention.  Not uremic so no indication for HD yet.  2. Sacral abscess - sp I&D, on augmentin.  3. DM type 2   Plan - as above   Vinson Moselleob Koray Soter MD [facility] pager [phone]    cell [phone] 10/25/2015, 11:48 AM    Recent Labs Lab 10/23/15 0502 10/24/15 0519 10/25/15 0813  NA 133* 133* 133*  K 4.1 4.5 4.2  CL 103 104 104  CO2 18* 14* 16*  GLUCOSE 109* 160* 99  BUN 49* 56* 58*  CREATININE 7.84* 9.53* 11.36*  CALCIUM 8.0* 8.0* 8.1*   No results for input(s): AST, ALT, ALKPHOS, BILITOT, PROT, ALBUMIN in the last 168 hours.  Recent Labs Lab 10/19/15 0910  10/21/15 0518 10/23/15 0502 10/24/15 0519  WBC 20.8*  < > 11.2* 10.9* 10.5  NEUTROABS 17.7*  --   --   --   --   HGB 10.8*  < > 9.4* 10.0* 10.1*  HCT 30.4*  < > 26.5* 28.7* 28.3*  MCV 81.9  < > 80.5 82.5 80.6  PLT 248  < > 229 221 197  < > = values in this interval not displayed. Iron/TIBC/Ferritin/ %Sat No results found for: IRON, TIBC, FERRITIN, IRONPCTSAT

## 2015-10-25 NOTE — Progress Notes (Signed)
[facility]  [facility]  [facility] is providing the following services: Infusion Pharmacy services for home IV ABX if ordered at DC.  If patient discharges after hours, please call [phone].   [name] 10/25/2015, 9:20 AM

## 2015-10-25 NOTE — Progress Notes (Signed)
PROGRESS NOTE                                                                                                                                                                                                             Patient Demographics:    [name], is a 45 y.o. female, DOB - 1970-03-07, ZOX:[id number]  Admit date - 10/19/2015   Admitting Physician Joseph ArtJessica U Vann, DO  Outpatient Primary MD for the patient is Verlon AuBoyd, Tammy Lamonica, MD  LOS - 6  Outpatient Specialists:None  Chief Complaint  Patient presents with  . Cellulitis       Brief Narrative   45 year old morbidly obese female with poorly controlled diabetes seen in the ED 3 times in the past 2 weeks for cellulitis of the perirectal area without abscess. She was sent home on Keflex but returned back to the ED stating worse. In the ED she was found to be septic with worsened cellulitis with necrotizing soft tissue infection . She was started on empiric IV vancomycin and Zosyn. Patient underwent I&D of the skin, subcutaneous tissue, fascia of presacral soft tissue on 9/15, followed by wound VAC placement. Patient developed progressive acute kidney injury and transferred to [facility] for possible need of dialysis.   Subjective:   Patient reports her pain to be stable on current medication.   Assessment  & Plan :    Principal Problem:   Necrotizing soft tissue infection (HCC) Status post I&D. Cultures growing viridans strep and Prevotella. Narrow antibiotics to Augmentin (day8/14) Continue wound VAC (management per surgery)  Active Problems: Acute kidney injury Suspect ATN due to sepsis. Also received IV vancomycin upon admission and IV contrast on 9/8. Marland Kitchen. Has good urine output but continues to have worsened renal function. -No uremia. Renal ultrasound without urinary retention or hydronephrosis. -IV fluids discontinued but given worsened renal function  has been resumed today. -Monitor I/O and renal function closely. Holding off on dialysis given good urine output and absence of uremia.    Diabetes mellitus, uncontrolled (HCC) Uncontrolled with A1c of 11.7. Stable on Lantus 25 units daily at bedtime and sliding scale coverage.    Morbid obesity (HCC)   Cellulitis       Code Status : Full code  Family Communication  : None at bedside  Disposition Plan  : Home pending improvement  in renal function  Barriers For Discharge : Acute renal failure  Consults  :   Surgery Renal  Procedures  :  Renal ultrasound I&D  DVT Prophylaxis  :  Heparin  Lab Results  Component Value Date   PLT 197 10/24/2015    Antibiotics  :    Anti-infectives    Start     Dose/Rate Route Frequency Ordered Stop   10/25/15 1200  amoxicillin-clavulanate (AUGMENTIN) 500-125 MG per tablet 500 mg     1 tablet Oral Every 24 hours 10/25/15 0939 11/01/15 1159   10/22/15 1300  cefTRIAXone (ROCEPHIN) 1 g in dextrose 5 % 50 mL IVPB  Status:  Discontinued     1 g 100 mL/hr over 30 Minutes Intravenous Every 24 hours 10/22/15 1245 10/25/15 0939   10/21/15 1200  doxycycline (VIBRA-TABS) tablet 100 mg  Status:  Discontinued     100 mg Oral Every 12 hours 10/21/15 0859 10/22/15 1246   10/19/15 2000  vancomycin (VANCOCIN) 1,250 mg in sodium chloride 0.9 % 250 mL IVPB  Status:  Discontinued     1,250 mg 166.7 mL/hr over 90 Minutes Intravenous Every 8 hours 10/19/15 1255 10/20/15 2014   10/19/15 1800  piperacillin-tazobactam (ZOSYN) IVPB 3.375 g  Status:  Discontinued     3.375 g 12.5 mL/hr over 240 Minutes Intravenous Every 8 hours 10/19/15 1255 10/21/15 0734   10/19/15 1030  vancomycin (VANCOCIN) 2,500 mg in sodium chloride 0.9 % 500 mL IVPB     2,500 mg 250 mL/hr over 120 Minutes Intravenous  Once 10/19/15 1007 10/19/15 1304   10/19/15 1015  piperacillin-tazobactam (ZOSYN) IVPB 3.375 g     3.375 g 100 mL/hr over 30 Minutes Intravenous STAT 10/19/15 1007  10/19/15 1128        Objective:   Vitals:   10/24/15 2044 10/25/15 0425 10/25/15 0849 10/25/15 1148  BP: (!) 129/35 (!) 123/43 (!) 115/50   Pulse: 75 73 73   Resp: 18 16 18    Temp: 99.1 F (37.3 C) 99 F (37.2 C) 98.3 F (36.8 C)   TempSrc: Oral Oral Oral   SpO2: 100% 98% 92%   Weight: (!) 162.7 kg (358 lb 11 oz)   (!) 156 kg (343 lb 14.7 oz)  Height: 5\' 7"  (1.702 m)       Wt Readings from Last 3 Encounters:  10/25/15 (!) 156 kg (343 lb 14.7 oz)  10/17/15 (!) 140.6 kg (310 lb)  10/13/15 (!) 140.6 kg (310 lb)     Intake/Output Summary (Last 24 hours) at 10/25/15 1510 Last data filed at 10/25/15 1400  Gross per 24 hour  Intake              410 ml  Output             1400 ml  Net             -990 ml     Physical Exam  ZOX:[id number] obese female not in distress HEENT: no pallor, moist mucosa, supple neck Chest: clear b/l, no added sounds CVS: N S1&S2, no murmurs,  GI: soft, NT, ND, BS+ Musculoskeletal: warm, no edema, gluteal wound VAC in place CNS: Alert and oriented, no tremors    Data Review:    CBC  Recent Labs Lab 10/19/15 0910 10/20/15 0716 10/21/15 0518 10/23/15 0502 10/24/15 0519  WBC 20.8* 13.5* 11.2* 10.9* 10.5  HGB 10.8* 9.7* 9.4* 10.0* 10.1*  HCT 30.4* 27.6* 26.5* 28.7* 28.3*  PLT 248 233 229  221 197  MCV 81.9 80.9 80.5 82.5 80.6  MCH 29.1 28.4 28.6 28.7 28.8  MCHC 35.5 35.1 35.5 34.8 35.7  RDW 12.5 12.8 12.9 13.1 13.2  LYMPHSABS 1.8  --   --   --   --   MONOABS 1.3*  --   --   --   --   EOSABS 0.0  --   --   --   --   BASOSABS 0.0  --   --   --   --     Chemistries   Recent Labs Lab 10/21/15 0518 10/22/15 1238 10/23/15 0502 10/24/15 0519 10/25/15 0813  NA 134* 133* 133* 133* 133*  K 3.4* 3.8 4.1 4.5 4.2  CL 104 104 103 104 104  CO2 20* 15* 18* 14* 16*  GLUCOSE 124* 139* 109* 160* 99  BUN 33* 42* 49* 56* 58*  CREATININE 4.25* 6.53* 7.84* 9.53* 11.36*  CALCIUM 7.8* 7.8* 8.0* 8.0* 8.1*    ------------------------------------------------------------------------------------------------------------------ No results for input(s): CHOL, HDL, LDLCALC, TRIG, CHOLHDL, LDLDIRECT in the last 72 hours.  Lab Results  Component Value Date   HGBA1C 11.7 (H) 10/19/2015   ------------------------------------------------------------------------------------------------------------------ No results for input(s): TSH, T4TOTAL, T3FREE, THYROIDAB in the last 72 hours.  Invalid input(s): FREET3 ------------------------------------------------------------------------------------------------------------------ No results for input(s): VITAMINB12, FOLATE, FERRITIN, TIBC, IRON, RETICCTPCT in the last 72 hours.  Coagulation profile No results for input(s): INR, PROTIME in the last 168 hours.  No results for input(s): DDIMER in the last 72 hours.  Cardiac Enzymes No results for input(s): CKMB, TROPONINI, MYOGLOBIN in the last 168 hours.  Invalid input(s): CK ------------------------------------------------------------------------------------------------------------------    Component Value Date/Time   BNP 69.1 03/16/2015 1145    Inpatient Medications  Scheduled Meds: . amoxicillin-clavulanate  1 tablet Oral Q24H  . buPROPion  150 mg Oral Daily  . heparin subcutaneous  5,000 Units Subcutaneous Q8H  . insulin aspart  0-20 Units Subcutaneous TID WC  . insulin aspart  0-5 Units Subcutaneous QHS  . insulin aspart  12 Units Subcutaneous TID WC  . insulin glargine  25 Units Subcutaneous QHS  . nystatin  5 mL Oral QID  . pantoprazole  40 mg Oral BID  . senna  2 tablet Oral BID  . sertraline  25 mg Oral Daily  . sodium chloride flush  3 mL Intravenous Q12H   Continuous Infusions:  PRN Meds:.albuterol, HYDROmorphone (DILAUDID) injection, ondansetron **OR** ondansetron (ZOFRAN) IV, oxyCODONE, phenol, promethazine, senna-docusate  Micro Results Recent Results (from the past 240 hour(s))   Blood Culture (routine x 2)     Status: None   Collection Time: 10/19/15 10:10 AM  Result Value Ref Range Status   Specimen Description BLOOD LEFT ARM  Final   Special Requests BOTTLES DRAWN AEROBIC AND ANAEROBIC  Final   Culture   Final    NO GROWTH 5 DAYS Performed at [facility]    Report Status 10/24/2015 FINAL  Final  Blood Culture (routine x 2)     Status: None   Collection Time: 10/19/15 10:24 AM  Result Value Ref Range Status   Specimen Description BLOOD LEFT HAND  Final   Special Requests IN PEDIATRIC BOTTLE  Final   Culture   Final    NO GROWTH 5 DAYS Performed at [facility]    Report Status 10/24/2015 FINAL  Final  Aerobic/Anaerobic Culture (surgical/deep wound)     Status: None   Collection Time: 10/19/15  1:49 PM  Result Value Ref Range  Status   Specimen Description ABSCESS PRESACRAL  Final   Special Requests NONE  Final   Gram Stain   Final    ABUNDANT WBC PRESENT,BOTH PMN AND MONONUCLEAR ABUNDANT GRAM NEGATIVE RODS ABUNDANT GRAM POSITIVE COCCI IN PAIRS AND CHAINS FEW GRAM POSITIVE RODS    Culture   Final    ABUNDANT VIRIDANS STREPTOCOCCUS RARE STAPHYLOCOCCUS SPECIES (COAGULASE NEGATIVE) ABUNDANT PREVOTELLA BIVIA BETA LACTAMASE POSITIVE Performed at [facility]    Report Status 10/23/2015 FINAL  Final   Organism ID, Bacteria VIRIDANS STREPTOCOCCUS  Final      Susceptibility   Viridans streptococcus - MIC*    PENICILLIN <=0.06 SENSITIVE Sensitive     CEFTRIAXONE <=0.12 SENSITIVE Sensitive     ERYTHROMYCIN <=0.12 SENSITIVE Sensitive     LEVOFLOXACIN 1 SENSITIVE Sensitive     VANCOMYCIN 0.5 SENSITIVE Sensitive     * ABUNDANT VIRIDANS STREPTOCOCCUS  MRSA PCR Screening     Status: Abnormal   Collection Time: 10/19/15  4:12 PM  Result Value Ref Range Status   MRSA by PCR POSITIVE (A) NEGATIVE Final    Comment:        The GeneXpert MRSA Assay (FDA approved for NASAL specimens only), is one component of a comprehensive  MRSA colonization surveillance program. It is not intended to diagnose MRSA infection nor to guide or monitor treatment for MRSA infections. RESULT CALLED TO, READ BACK BY AND VERIFIED WITH: DUNKELBERGER,K AT 1821 ON [id number] BY MOSLEY,J   Urine culture     Status: None   Collection Time: 10/19/15  5:05 PM  Result Value Ref Range Status   Specimen Description URINE, RANDOM  Final   Special Requests NONE  Final   Culture NO GROWTH Performed at [facility]   Final   Report Status 10/20/2015 FINAL  Final    Radiology Reports Ct Abdomen Pelvis W Contrast  Result Date: 10/13/2015 CLINICAL DATA:  Possible perirectal abscess. Further evaluation requested. Initial encounter. EXAM: CT ABDOMEN AND PELVIS WITH CONTRAST TECHNIQUE: Multidetector CT imaging of the abdomen and pelvis was performed using the standard protocol following bolus administration of intravenous contrast. CONTRAST:  ISOVUE-300 IOPAMIDOL (ISOVUE-300) INJECTION 61% COMPARISON:  CT of the abdomen and pelvis from 06/21/2014 FINDINGS: Lower chest: The visualized lung bases are grossly clear. The visualized portions of the mediastinum are unremarkable. Hepatobiliary: The liver is unremarkable in appearance. Stones are noted dependently within the gallbladder. The gallbladder is otherwise unremarkable. The common bile duct remains normal in caliber. Pancreas: The pancreas is within normal limits. Spleen: The spleen is unremarkable in appearance. Adrenals/Urinary Tract: The adrenal glands are unremarkable in appearance. The kidneys are within normal limits. There is no evidence of hydronephrosis. No renal or ureteral stones are identified. No perinephric stranding is seen. Stomach/Bowel: The stomach is unremarkable in appearance. The small bowel is within normal limits. The appendix is not visualized; there is no evidence for appendicitis. The colon is unremarkable in appearance. Vascular/Lymphatic: The abdominal aorta is  unremarkable in appearance. The inferior vena cava is grossly unremarkable. No retroperitoneal lymphadenopathy is seen. No pelvic sidewall lymphadenopathy is identified. Reproductive: The bladder is decompressed and not well assessed. The uterus contains a large 8.6 cm fibroid, with minimal calcification. No suspicious adnexal masses are seen. The ovaries are grossly symmetric. There is mild apparent soft tissue prominence at the cervix and vagina, of uncertain significance. If the patient's symptoms persist, pelvic ultrasound would be helpful for further evaluation. The anorectal canal is unremarkable in appearance. There  is no evidence of perirectal abscess. Other: Minimal nonspecific skin thickening is noted at the anterior left mid abdomen. Musculoskeletal: No acute osseous abnormalities are identified. The visualized musculature is unremarkable in appearance. IMPRESSION: 1. No acute abnormality seen to explain the patient's symptoms. No evidence of perirectal abscess. The anorectal canal is unremarkable in appearance. 2. Mild apparent soft tissue prominence at the cervix and vagina, of uncertain significance. If the patient's symptoms persist, pelvic ultrasound would be helpful for further evaluation. 3. Cholelithiasis.  Gallbladder otherwise unremarkable. 4. Large 8.6 cm uterine fibroid noted. Electronically Signed   By: Roanna Raider M.D.   On: 10/13/2015 23:05   US Renal  Result Date: 10/21/2015 CLINICAL DATA:  Acute renal failure. EXAM: RENAL / URINARY TRACT ULTRASOUND COMPLETE COMPARISON:  None. FINDINGS: Right Kidney: Length: 16.6 cm. Echogenicity within normal limits. No mass or hydronephrosis visualized. Left Kidney: Length: 15.9 cm. Echogenicity within normal limits. No mass or hydronephrosis visualized. Bladder: Appears normal for degree of bladder distention. IMPRESSION: 1. Relatively large kidneys which may be normal variant for this patient. 2. No other abnormality.  No hydronephrosis.  Electronically Signed   By: Amie Portland M.D.   On: 10/21/2015 12:30    Time Spent in minutes  25   Eddie North M.D on 10/25/2015 at 3:10 PM  Between 7am to 7pm - Pager - [phone]  After 7pm go to [email or web address] - password [facility]  [facility] -  Office  [phone]

## 2015-10-25 NOTE — Progress Notes (Signed)
Patient ID: [name], female   DOB: [birth year], 45 y.o.   MRN: [id number]  [facility] Progress Note  4 Days Post-Op  Subjective: Feeling well today. States that she did not use pain medication yesterday or today. No abdominal pain, nausea, vomiting. States that she is urinating and having regular BM's. Vac change planned for today.  Objective: Vital signs in last 24 hours: Temp:  [98.3 F (36.8 C)-99.1 F (37.3 C)] 98.3 F (36.8 C) (09/20 0849) Pulse Rate:  [73-77] 73 (09/20 0849) Resp:  [16-18] 18 (09/20 0849) BP: (115-144)/(35-70) 115/50 (09/20 0849) SpO2:  [92 %-100 %] 92 % (09/20 0849) Weight:  [358 lb 11 oz (162.7 kg)] 358 lb 11 oz (162.7 kg) (09/19 2044) Last BM Date: 10/25/15  Intake/Output from previous day: 09/19 0701 - 09/20 0700 In: 410 [P.O.:360; IV Piggyback:50] Out: 1450 [Urine:1200; Drains:250] Intake/Output this shift: Total I/O In: 0  Out: 200 [Urine:200]  PE: Gen:  alert, NAD, pleasant Abd: Soft, NT Wound: vac in place over presacral wound and working well -- 250cc/24hr serosanguinous output  Lab Results:   Recent Labs  10/23/15 0502 10/24/15 0519  WBC 10.9* 10.5  HGB 10.0* 10.1*  HCT 28.7* 28.3*  PLT 221 197   BMET  Recent Labs  10/24/15 0519 10/25/15 0813  NA 133* 133*  K 4.5 4.2  CL 104 104  CO2 14* 16*  GLUCOSE 160* 99  BUN 56* 58*  CREATININE 9.53* 11.36*  CALCIUM 8.0* 8.1*   PT/INR No results for input(s): LABPROT, INR in the last 72 hours. CMP     Component Value Date/Time   NA 133 (L) 10/25/2015 0813   NA 138 05/05/2013 1649   K 4.2 10/25/2015 0813   CL 104 10/25/2015 0813   CO2 16 (L) 10/25/2015 0813   GLUCOSE 99 10/25/2015 0813   BUN 58 (H) 10/25/2015 0813   BUN 11 05/05/2013 1649   CREATININE 11.36 (H) 10/25/2015 0813   CALCIUM 8.1 (L) 10/25/2015 0813   PROT 7.5 10/13/2015 1554   PROT 7.7 05/05/2013 1649   ALBUMIN 3.1 (L) 10/13/2015 1554   ALBUMIN 3.7 05/05/2013 1649   AST 33  10/13/2015 1554   ALT 25 10/13/2015 1554   ALKPHOS 123 10/13/2015 1554   BILITOT 0.7 10/13/2015 1554   GFRNONAA 4 (L) 10/25/2015 0813   GFRAA 4 (L) 10/25/2015 0813   Lipase     Component Value Date/Time   LIPASE 18 (L) 09/02/2014 1743       Studies/Results: No results found.  Anti-infectives: Anti-infectives    Start     Dose/Rate Route Frequency Ordered Stop   10/25/15 1200  amoxicillin-clavulanate (AUGMENTIN) 500-125 MG per tablet 500 mg     1 tablet Oral Every 24 hours 10/25/15 0939 11/01/15 1159   10/22/15 1300  cefTRIAXone (ROCEPHIN) 1 g in dextrose 5 % 50 mL IVPB  Status:  Discontinued     1 g 100 mL/hr over 30 Minutes Intravenous Every 24 hours 10/22/15 1245 10/25/15 0939   10/21/15 1200  doxycycline (VIBRA-TABS) tablet 100 mg  Status:  Discontinued     100 mg Oral Every 12 hours 10/21/15 0859 10/22/15 1246   10/19/15 2000  vancomycin (VANCOCIN) 1,250 mg in sodium chloride 0.9 % 250 mL IVPB  Status:  Discontinued     1,250 mg 166.7 mL/hr over 90 Minutes Intravenous Every 8 hours 10/19/15 1255 10/20/15 2014   10/19/15 1800  piperacillin-tazobactam (ZOSYN) IVPB 3.375 g  Status:  Discontinued  3.375 g 12.5 mL/hr over 240 Minutes Intravenous Every 8 hours 10/19/15 1255 10/21/15 0734   10/19/15 1030  vancomycin (VANCOCIN) 2,500 mg in sodium chloride 0.9 % 500 mL IVPB     2,500 mg 250 mL/hr over 120 Minutes Intravenous  Once 10/19/15 1007 10/19/15 1304   10/19/15 1015  piperacillin-tazobactam (ZOSYN) IVPB 3.375 g     3.375 g 100 mL/hr over 30 Minutes Intravenous STAT 10/19/15 1007 10/19/15 1128       Assessment/Plan Presacral skin and soft tissue infection POD #6Irrigation and debridement in ORDr. Claud KelpHaywood Ingram 10/19/15 - 250cc/24hr serosanguinous output - vac changes scheduled for MWF  [facility]  ID - zosyn 9/14>>9/16, vanc 9/14>>9/15, rocephin 9/17>> FEN - carb modified VTE - lovenox   Plan - nurse  ordering supplies for vac change and will page me when ready to change    LOS: 6 days    Edson SnowballBROOKE A MILLER , [facility] 10/25/2015, 10:48 AM Pager: [phone] Consults: [phone] Mon-Fri 7:00 am-4:30 pm Sat-Sun 7:00 am-11:30 am

## 2015-10-26 DIAGNOSIS — N179 Acute kidney failure, unspecified: Secondary | ICD-10-CM

## 2015-10-26 LAB — URINALYSIS, ROUTINE W REFLEX MICROSCOPIC
BILIRUBIN URINE: NEGATIVE
Glucose, UA: NEGATIVE mg/dL
KETONES UR: NEGATIVE mg/dL
NITRITE: NEGATIVE
PROTEIN: 30 mg/dL — AB
Specific Gravity, Urine: 1.005 (ref 1.005–1.030)
pH: 5.5 (ref 5.0–8.0)

## 2015-10-26 LAB — URINE MICROSCOPIC-ADD ON

## 2015-10-26 LAB — BASIC METABOLIC PANEL
Anion gap: 15 (ref 5–15)
BUN: 62 mg/dL — AB (ref 6–20)
CALCIUM: 8.1 mg/dL — AB (ref 8.9–10.3)
CHLORIDE: 104 mmol/L (ref 101–111)
CO2: 15 mmol/L — ABNORMAL LOW (ref 22–32)
Creatinine, Ser: 12.68 mg/dL — ABNORMAL HIGH (ref 0.44–1.00)
GFR calc Af Amer: 4 mL/min — ABNORMAL LOW (ref >60)
GFR calc non Af Amer: 3 mL/min — ABNORMAL LOW (ref >60)
Glucose, Bld: 104 mg/dL — ABNORMAL HIGH (ref 65–99)
Potassium: 4.4 mmol/L (ref 3.5–5.1)
SODIUM: 134 mmol/L — AB (ref 135–145)

## 2015-10-26 LAB — GLUCOSE, CAPILLARY
GLUCOSE-CAPILLARY: 114 mg/dL — AB (ref 65–99)
GLUCOSE-CAPILLARY: 110 mg/dL — AB (ref 65–99)
GLUCOSE-CAPILLARY: 130 mg/dL — AB (ref 65–99)
Glucose-Capillary: 104 mg/dL — ABNORMAL HIGH (ref 65–99)

## 2015-10-26 MED ORDER — SODIUM CHLORIDE 0.9 % IV SOLN
INTRAVENOUS | Status: DC
  Administered 2015-10-26: 14:00:00 via INTRAVENOUS

## 2015-10-26 NOTE — Progress Notes (Signed)
PROGRESS NOTE                                                                                                                                                                                                             Patient Demographics:    [name], is a 45 y.o. female, DOB - [birth year], ZOX:[id number]  Admit date - 10/19/2015   Admitting Physician Joseph Art, DO  Outpatient Primary MD for the patient is Verlon Au, MD  LOS - 7  Outpatient Specialists:None  Chief Complaint  Patient presents with  . Cellulitis       Brief Narrative   45 year old morbidly obese female with poorly controlled diabetes seen in the ED 3 times in the past 2 weeks for cellulitis of the perirectal area without abscess. She was sent home on Keflex but returned back to the ED stating worse. In the ED she was found to be septic with worsened cellulitis with necrotizing soft tissue infection . She was started on empiric IV vancomycin and Zosyn. Patient underwent I&D of the skin, subcutaneous tissue, fascia of presacral soft tissue on 9/15, followed by wound VAC placement. Patient developed progressive acute kidney injury and transferred to [facility] for possible need of dialysis.   Subjective:   No overnight issues. Renal function continues to worsen despite IV fluids and having decent urine output.   Assessment  & Plan :    Principal Problem:   Necrotizing soft tissue infection (HCC) Status post I&D. Cultures growing viridans strep and Prevotella. Antibiotics narrowed to Augmentin (day 9/14) . Continue wound VAC (management per surgery)  Active Problems: Acute kidney injury Suspect ATN due to sepsis vs dehydration. Also received IV vancomycin upon admission and IV contrast on 9/8. Marland Kitchen  Renal ultrasound without urinary retention or hydronephrosis. Renal function continues to worsen despite IV hydration.  Nephrology  following and holding off on dialysis given adequate urine output and absence of uremia.    Diabetes mellitus, uncontrolled (HCC) Uncontrolled with A1c of 11.7. Stable on Lantus 25 units daily at bedtime and sliding scale coverage.    Morbid obesity (HCC)        Code Status : Full code  Family Communication  : Daughter at bedside  Disposition Plan  : Home once general function improved  Barriers For Discharge : Acute renal failure  Consults  :   Surgery Renal  Procedures  :  Renal ultrasound I&D  DVT Prophylaxis  :  Heparin  Lab Results  Component Value Date   PLT 197 10/24/2015    Antibiotics  :    Anti-infectives    Start     Dose/Rate Route Frequency Ordered Stop   10/25/15 1200  amoxicillin-clavulanate (AUGMENTIN) 500-125 MG per tablet 500 mg     1 tablet Oral Every 24 hours 10/25/15 0939 11/01/15 1159   10/22/15 1300  cefTRIAXone (ROCEPHIN) 1 g in dextrose 5 % 50 mL IVPB  Status:  Discontinued     1 g 100 mL/hr over 30 Minutes Intravenous Every 24 hours 10/22/15 1245 10/25/15 0939   10/21/15 1200  doxycycline (VIBRA-TABS) tablet 100 mg  Status:  Discontinued     100 mg Oral Every 12 hours 10/21/15 0859 10/22/15 1246   10/19/15 2000  vancomycin (VANCOCIN) 1,250 mg in sodium chloride 0.9 % 250 mL IVPB  Status:  Discontinued     1,250 mg 166.7 mL/hr over 90 Minutes Intravenous Every 8 hours 10/19/15 1255 10/20/15 2014   10/19/15 1800  piperacillin-tazobactam (ZOSYN) IVPB 3.375 g  Status:  Discontinued     3.375 g 12.5 mL/hr over 240 Minutes Intravenous Every 8 hours 10/19/15 1255 10/21/15 0734   10/19/15 1030  vancomycin (VANCOCIN) 2,500 mg in sodium chloride 0.9 % 500 mL IVPB     2,500 mg 250 mL/hr over 120 Minutes Intravenous  Once 10/19/15 1007 10/19/15 1304   10/19/15 1015  piperacillin-tazobactam (ZOSYN) IVPB 3.375 g     3.375 g 100 mL/hr over 30 Minutes Intravenous STAT 10/19/15 1007 10/19/15 1128        Objective:   Vitals:   10/25/15 2041  10/26/15 0445 10/26/15 0500 10/26/15 0922  BP: (!) 124/49 (!) 120/57  (!) 122/35  Pulse: 80 84  72  Resp: 18 20  16   Temp: 98.9 F (37.2 C) 98.7 F (37.1 C)  98.8 F (37.1 C)  TempSrc: Oral Oral  Oral  SpO2: 97% 98%  97%  Weight: (!) 154.5 kg (340 lb 9.8 oz)  (!) 154.5 kg (340 lb 9.8 oz)   Height:        Wt Readings from Last 3 Encounters:  10/26/15 (!) 154.5 kg (340 lb 9.8 oz)  10/17/15 (!) 140.6 kg (310 lb)  10/13/15 (!) 140.6 kg (310 lb)     Intake/Output Summary (Last 24 hours) at 10/26/15 1126 Last data filed at 10/26/15 1048  Gross per 24 hour  Intake              840 ml  Output             1550 ml  Net             -710 ml     Physical Exam  ZOX:[id number] obese, not in distress HEENT: moist mucosa, supple neck Chest: clear b/l, no added sounds CVS: N S1&S2, no murmurs,  GI: soft, NT, ND, BS+ Musculoskeletal: warm, no edema, gluteal wound VAC in place CNS:  no tremors    Data Review:    CBC  Recent Labs Lab 10/20/15 0716 10/21/15 0518 10/23/15 0502 10/24/15 0519  WBC 13.5* 11.2* 10.9* 10.5  HGB 9.7* 9.4* 10.0* 10.1*  HCT 27.6* 26.5* 28.7* 28.3*  PLT 233 229 221 197  MCV 80.9 80.5 82.5 80.6  MCH 28.4 28.6 28.7 28.8  MCHC 35.1 35.5 34.8 35.7  RDW 12.8 12.9 13.1 13.2  Chemistries   Recent Labs Lab 10/22/15 1238 10/23/15 0502 10/24/15 0519 10/25/15 0813 10/26/15 0419  NA 133* 133* 133* 133* 134*  K 3.8 4.1 4.5 4.2 4.4  CL 104 103 104 104 104  CO2 15* 18* 14* 16* 15*  GLUCOSE 139* 109* 160* 99 104*  BUN 42* 49* 56* 58* 62*  CREATININE 6.53* 7.84* 9.53* 11.36* 12.68*  CALCIUM 7.8* 8.0* 8.0* 8.1* 8.1*   ------------------------------------------------------------------------------------------------------------------ No results for input(s): CHOL, HDL, LDLCALC, TRIG, CHOLHDL, LDLDIRECT in the last 72 hours.  Lab Results  Component Value Date   HGBA1C 11.7 (H) 10/19/2015    ------------------------------------------------------------------------------------------------------------------ No results for input(s): TSH, T4TOTAL, T3FREE, THYROIDAB in the last 72 hours.  Invalid input(s): FREET3 ------------------------------------------------------------------------------------------------------------------ No results for input(s): VITAMINB12, FOLATE, FERRITIN, TIBC, IRON, RETICCTPCT in the last 72 hours.  Coagulation profile No results for input(s): INR, PROTIME in the last 168 hours.  No results for input(s): DDIMER in the last 72 hours.  Cardiac Enzymes No results for input(s): CKMB, TROPONINI, MYOGLOBIN in the last 168 hours.  Invalid input(s): CK ------------------------------------------------------------------------------------------------------------------    Component Value Date/Time   BNP 69.1 03/16/2015 1145    Inpatient Medications  Scheduled Meds: . amoxicillin-clavulanate  1 tablet Oral Q24H  . buPROPion  150 mg Oral Daily  . heparin subcutaneous  5,000 Units Subcutaneous Q8H  . insulin aspart  0-20 Units Subcutaneous TID WC  . insulin aspart  0-5 Units Subcutaneous QHS  . insulin aspart  12 Units Subcutaneous TID WC  . insulin glargine  25 Units Subcutaneous QHS  . nystatin  5 mL Oral QID  . pantoprazole  40 mg Oral BID  . senna  2 tablet Oral BID  . sertraline  25 mg Oral Daily  . sodium chloride flush  3 mL Intravenous Q12H   Continuous Infusions:  PRN Meds:.albuterol, diphenhydrAMINE, HYDROmorphone (DILAUDID) injection, ondansetron **OR** ondansetron (ZOFRAN) IV, oxyCODONE, phenol, promethazine, senna-docusate  Micro Results Recent Results (from the past 240 hour(s))  Blood Culture (routine x 2)     Status: None   Collection Time: 10/19/15 10:10 AM  Result Value Ref Range Status   Specimen Description BLOOD LEFT ARM  Final   Special Requests BOTTLES DRAWN AEROBIC AND ANAEROBIC  Final   Culture   Final    NO GROWTH 5  DAYS Performed at [facility]    Report Status 10/24/2015 FINAL  Final  Blood Culture (routine x 2)     Status: None   Collection Time: 10/19/15 10:24 AM  Result Value Ref Range Status   Specimen Description BLOOD LEFT HAND  Final   Special Requests IN PEDIATRIC BOTTLE  Final   Culture   Final    NO GROWTH 5 DAYS Performed at [facility]    Report Status 10/24/2015 FINAL  Final  Aerobic/Anaerobic Culture (surgical/deep wound)     Status: None   Collection Time: 10/19/15  1:49 PM  Result Value Ref Range Status   Specimen Description ABSCESS PRESACRAL  Final   Special Requests NONE  Final   Gram Stain   Final    ABUNDANT WBC PRESENT,BOTH PMN AND MONONUCLEAR ABUNDANT GRAM NEGATIVE RODS ABUNDANT GRAM POSITIVE COCCI IN PAIRS AND CHAINS FEW GRAM POSITIVE RODS    Culture   Final    ABUNDANT VIRIDANS STREPTOCOCCUS RARE STAPHYLOCOCCUS SPECIES (COAGULASE NEGATIVE) ABUNDANT PREVOTELLA BIVIA BETA LACTAMASE POSITIVE Performed at [facility]    Report Status 10/23/2015 FINAL  Final   Organism ID, Bacteria VIRIDANS  STREPTOCOCCUS  Final      Susceptibility   Viridans streptococcus - MIC*    PENICILLIN <=0.06 SENSITIVE Sensitive     CEFTRIAXONE <=0.12 SENSITIVE Sensitive     ERYTHROMYCIN <=0.12 SENSITIVE Sensitive     LEVOFLOXACIN 1 SENSITIVE Sensitive     VANCOMYCIN 0.5 SENSITIVE Sensitive     * ABUNDANT VIRIDANS STREPTOCOCCUS  MRSA PCR Screening     Status: Abnormal   Collection Time: 10/19/15  4:12 PM  Result Value Ref Range Status   MRSA by PCR POSITIVE (A) NEGATIVE Final    Comment:        The GeneXpert MRSA Assay (FDA approved for NASAL specimens only), is one component of a comprehensive MRSA colonization surveillance program. It is not intended to diagnose MRSA infection nor to guide or monitor treatment for MRSA infections. RESULT CALLED TO, READ BACK BY AND VERIFIED WITH: DUNKELBERGER,K AT 1821 ON [id number] BY MOSLEY,J   Urine culture      Status: None   Collection Time: 10/19/15  5:05 PM  Result Value Ref Range Status   Specimen Description URINE, RANDOM  Final   Special Requests NONE  Final   Culture NO GROWTH Performed at [facility]   Final   Report Status 10/20/2015 FINAL  Final    Radiology Reports Ct Abdomen Pelvis W Contrast  Result Date: 10/13/2015 CLINICAL DATA:  Possible perirectal abscess. Further evaluation requested. Initial encounter. EXAM: CT ABDOMEN AND PELVIS WITH CONTRAST TECHNIQUE: Multidetector CT imaging of the abdomen and pelvis was performed using the standard protocol following bolus administration of intravenous contrast. CONTRAST:  100mL ISOVUE-300 IOPAMIDOL (ISOVUE-300) INJECTION 61% COMPARISON:  CT of the abdomen and pelvis from 06/21/2014 FINDINGS: Lower chest: The visualized lung bases are grossly clear. The visualized portions of the mediastinum are unremarkable. Hepatobiliary: The liver is unremarkable in appearance. Stones are noted dependently within the gallbladder. The gallbladder is otherwise unremarkable. The common bile duct remains normal in caliber. Pancreas: The pancreas is within normal limits. Spleen: The spleen is unremarkable in appearance. Adrenals/Urinary Tract: The adrenal glands are unremarkable in appearance. The kidneys are within normal limits. There is no evidence of hydronephrosis. No renal or ureteral stones are identified. No perinephric stranding is seen. Stomach/Bowel: The stomach is unremarkable in appearance. The small bowel is within normal limits. The appendix is not visualized; there is no evidence for appendicitis. The colon is unremarkable in appearance. Vascular/Lymphatic: The abdominal aorta is unremarkable in appearance. The inferior vena cava is grossly unremarkable. No retroperitoneal lymphadenopathy is seen. No pelvic sidewall lymphadenopathy is identified. Reproductive: The bladder is decompressed and not well assessed. The uterus contains a large 8.6 cm  fibroid, with minimal calcification. No suspicious adnexal masses are seen. The ovaries are grossly symmetric. There is mild apparent soft tissue prominence at the cervix and vagina, of uncertain significance. If the patient's symptoms persist, pelvic ultrasound would be helpful for further evaluation. The anorectal canal is unremarkable in appearance. There is no evidence of perirectal abscess. Other: Minimal nonspecific skin thickening is noted at the anterior left mid abdomen. Musculoskeletal: No acute osseous abnormalities are identified. The visualized musculature is unremarkable in appearance. IMPRESSION: 1. No acute abnormality seen to explain the patient's symptoms. No evidence of perirectal abscess. The anorectal canal is unremarkable in appearance. 2. Mild apparent soft tissue prominence at the cervix and vagina, of uncertain significance. If the patient's symptoms persist, pelvic ultrasound would be helpful for further evaluation. 3. Cholelithiasis.  Gallbladder otherwise unremarkable. 4. Large 8.6  cm uterine fibroid noted. Electronically Signed   By: Roanna Raider M.D.   On: 10/13/2015 23:05   US Renal  Result Date: 10/21/2015 CLINICAL DATA:  Acute renal failure. EXAM: RENAL / URINARY TRACT ULTRASOUND COMPLETE COMPARISON:  None. FINDINGS: Right Kidney: Length: 16.6 cm. Echogenicity within normal limits. No mass or hydronephrosis visualized. Left Kidney: Length: 15.9 cm. Echogenicity within normal limits. No mass or hydronephrosis visualized. Bladder: Appears normal for degree of bladder distention. IMPRESSION: 1. Relatively large kidneys which may be normal variant for this patient. 2. No other abnormality.  No hydronephrosis. Electronically Signed   By: Amie Portland M.D.   On: 10/21/2015 12:30    Time Spent in minutes  25   Eddie North M.D on 10/26/2015 at 11:26 AM  Between 7am to 7pm - Pager - [phone]  After 7pm go to [email or web address] - password [facility]  [facility] -   Office  [phone]

## 2015-10-26 NOTE — Progress Notes (Signed)
  [facility] Progress Note   Subjective: now is nauseous, no confusion or SOB.  Creat only up to 12 today.  UOP 1L yest.   Vitals:   10/25/15 2041 10/26/15 0445 10/26/15 0500 10/26/15 0922  BP: (!) 124/49 (!) 120/57  (!) 122/35  Pulse: 80 84  72  Resp: 18 20  16   Temp: 98.9 F (37.2 C) 98.7 F (37.1 C)  98.8 F (37.1 C)  TempSrc: Oral Oral  Oral  SpO2: 97% 98%  97%  Weight: (!) 154.5 kg (340 lb 9.8 oz)  (!) 154.5 kg (340 lb 9.8 oz)   Height:        Inpatient medications: . amoxicillin-clavulanate  1 tablet Oral Q24H  . buPROPion  150 mg Oral Daily  . heparin subcutaneous  5,000 Units Subcutaneous Q8H  . insulin aspart  0-20 Units Subcutaneous TID WC  . insulin aspart  0-5 Units Subcutaneous QHS  . insulin aspart  12 Units Subcutaneous TID WC  . insulin glargine  25 Units Subcutaneous QHS  . nystatin  5 mL Oral QID  . pantoprazole  40 mg Oral BID  . senna  2 tablet Oral BID  . sertraline  25 mg Oral Daily  . sodium chloride flush  3 mL Intravenous Q12H     albuterol, diphenhydrAMINE, HYDROmorphone (DILAUDID) injection, ondansetron **OR** ondansetron (ZOFRAN) IV, oxyCODONE, phenol, promethazine, senna-docusate  Exam: Alert, no distress, lying prone No jvd Chest clear bilat RRR no mrg Abd obese no CVAT Sacral open wound w VAC system in place No LE edema, no UE edema Neuro NF, ox3 no asterixis   Renal US - 16/ 16 cm kidneys, no hydro UA 9/14 > few bact, 30 prot, 0-5 wbc, 6-30 epis, no RBC, 1.029     Assessment: 1. Acute renal failure - prob ATN given sepsis/ borderline hypotension/ large NSAID's at home/ large Vanc load on admission (6.25 gm load in first 24hrs). Baseline creat 0.7 on admission. Creat up to 11 > 12 today, but rate of rise may be slowing. Early uremic symptoms, will cont to watch w/o HD for now.  Hopefully will recover w/o HD.  Resume light IVF's.   2. Sacral abscess - sp I&D, on augmentin.  3. DM type 2   Plan - as above   Vinson Moselleob  Tyiana Hill MD [facility] pager [phone]    cell [phone] 10/26/2015, 1:24 PM    Recent Labs Lab 10/24/15 0519 10/25/15 0813 10/26/15 0419  NA 133* 133* 134*  K 4.5 4.2 4.4  CL 104 104 104  CO2 14* 16* 15*  GLUCOSE 160* 99 104*  BUN 56* 58* 62*  CREATININE 9.53* 11.36* 12.68*  CALCIUM 8.0* 8.1* 8.1*   No results for input(s): AST, ALT, ALKPHOS, BILITOT, PROT, ALBUMIN in the last 168 hours.  Recent Labs Lab 10/21/15 0518 10/23/15 0502 10/24/15 0519  WBC 11.2* 10.9* 10.5  HGB 9.4* 10.0* 10.1*  HCT 26.5* 28.7* 28.3*  MCV 80.5 82.5 80.6  PLT 229 221 197   Iron/TIBC/Ferritin/ %Sat No results found for: IRON, TIBC, FERRITIN, IRONPCTSAT

## 2015-10-27 ENCOUNTER — Inpatient Hospital Stay (HOSPITAL_COMMUNITY): Payer: Commercial Managed Care - PPO

## 2015-10-27 ENCOUNTER — Encounter (HOSPITAL_COMMUNITY): Payer: Self-pay | Admitting: Interventional Radiology

## 2015-10-27 HISTORY — PX: IR GENERIC HISTORICAL: IMG1180011

## 2015-10-27 LAB — BASIC METABOLIC PANEL
ANION GAP: 15 (ref 5–15)
BUN: 67 mg/dL — AB (ref 6–20)
CHLORIDE: 103 mmol/L (ref 101–111)
CO2: 16 mmol/L — ABNORMAL LOW (ref 22–32)
Calcium: 8.1 mg/dL — ABNORMAL LOW (ref 8.9–10.3)
Creatinine, Ser: 13.94 mg/dL — ABNORMAL HIGH (ref 0.44–1.00)
GFR, EST AFRICAN AMERICAN: 3 mL/min — AB (ref >60)
GFR, EST NON AFRICAN AMERICAN: 3 mL/min — AB (ref >60)
Glucose, Bld: 119 mg/dL — ABNORMAL HIGH (ref 65–99)
POTASSIUM: 4.2 mmol/L (ref 3.5–5.1)
SODIUM: 134 mmol/L — AB (ref 135–145)

## 2015-10-27 LAB — GLUCOSE, CAPILLARY
GLUCOSE-CAPILLARY: 107 mg/dL — AB (ref 65–99)
GLUCOSE-CAPILLARY: 99 mg/dL (ref 65–99)
GLUCOSE-CAPILLARY: 118 mg/dL — AB (ref 65–99)

## 2015-10-27 LAB — PHOSPHORUS: Phosphorus: 9.7 mg/dL — ABNORMAL HIGH (ref 2.5–4.6)

## 2015-10-27 MED ORDER — LIDOCAINE HCL 1 % IJ SOLN
INTRAMUSCULAR | Status: AC
  Filled 2015-10-27: qty 20

## 2015-10-27 MED ORDER — LIDOCAINE HCL 1 % IJ SOLN
INTRAMUSCULAR | Status: DC | PRN
  Administered 2015-10-27: 10 mL

## 2015-10-27 MED ORDER — HEPARIN SODIUM (PORCINE) 1000 UNIT/ML IJ SOLN
INTRAMUSCULAR | Status: AC
  Filled 2015-10-27: qty 1

## 2015-10-27 NOTE — Procedures (Signed)
Interventional Radiology Procedure Note  Procedure: Placement of a right IJ approach Tri-alysis catheter, temporary HD catheter.  20cm.  Tip is positioned at the superior cavoatrial junction and catheter is ready for immediate use.  Complications: None Recommendations:  - Ok to shower tomorrow - Do not submerge for 7 days - Routine line care   Signed,  Yvone NeuJaime S. Loreta AveWagner, DO

## 2015-10-27 NOTE — Progress Notes (Signed)
6 Days Post-Op  Subjective: No new complaint  Objective: Vital signs in last 24 hours: Temp:  [98.2 F (36.8 C)-99 F (37.2 C)] 99 F (37.2 C) (09/22 0509) Pulse Rate:  [72-80] 77 (09/22 0509) Resp:  [18-21] 21 (09/22 0509) BP: (117-151)/(51-68) 117/57 (09/22 0509) SpO2:  [97 %-99 %] 97 % (09/22 0509) Weight:  [156 kg (344 lb)] 156 kg (344 lb) (09/21 2116) Last BM Date: 10/26/15  Intake/Output from previous day: 09/21 0701 - 09/22 0700 In: 480 [P.O.:480] Out: 2200 [Urine:2200] Intake/Output this shift: No intake/output data recorded.  Incision/Wound:VAC on sacral wound  Lab Results:  No results for input(s): WBC, HGB, HCT, PLT in the last 72 hours. BMET  Recent Labs  10/26/15 0419 10/27/15 0530  NA 134* 134*  K 4.4 4.2  CL 104 103  CO2 15* 16*  GLUCOSE 104* 119*  BUN 62* 67*  CREATININE 12.68* 13.94*  CALCIUM 8.1* 8.1*   PT/INR No results for input(s): LABPROT, INR in the last 72 hours. ABG No results for input(s): PHART, HCO3 in the last 72 hours.  Invalid input(s): PCO2, PO2  Studies/Results: No results found.  Anti-infectives: Anti-infectives    Start     Dose/Rate Route Frequency Ordered Stop   10/25/15 1200  amoxicillin-clavulanate (AUGMENTIN) 500-125 MG per tablet 500 mg     1 tablet Oral Every 24 hours 10/25/15 0939 11/01/15 1159   10/22/15 1300  cefTRIAXone (ROCEPHIN) 1 g in dextrose 5 % 50 mL IVPB  Status:  Discontinued     1 g 100 mL/hr over 30 Minutes Intravenous Every 24 hours 10/22/15 1245 10/25/15 0939   10/21/15 1200  doxycycline (VIBRA-TABS) tablet 100 mg  Status:  Discontinued     100 mg Oral Every 12 hours 10/21/15 0859 10/22/15 1246   10/19/15 2000  vancomycin (VANCOCIN) 1,250 mg in sodium chloride 0.9 % 250 mL IVPB  Status:  Discontinued     1,250 mg 166.7 mL/hr over 90 Minutes Intravenous Every 8 hours 10/19/15 1255 10/20/15 2014   10/19/15 1800  piperacillin-tazobactam (ZOSYN) IVPB 3.375 g  Status:  Discontinued     3.375  g 12.5 mL/hr over 240 Minutes Intravenous Every 8 hours 10/19/15 1255 10/21/15 0734   10/19/15 1030  vancomycin (VANCOCIN) 2,500 mg in sodium chloride 0.9 % 500 mL IVPB     2,500 mg 250 mL/hr over 120 Minutes Intravenous  Once 10/19/15 1007 10/19/15 1304   10/19/15 1015  piperacillin-tazobactam (ZOSYN) IVPB 3.375 g     3.375 g 100 mL/hr over 30 Minutes Intravenous STAT 10/19/15 1007 10/19/15 1128      Assessment/Plan: Presacral skin and soft tissue infection S/P Irrigation and debridement in OR[facility]. [facility] 10/19/15 - vac changes scheduled for MWF - we will check with VAC change Monday  ARF - per primary and Renal Service  LOS: 8 days    Oryan Winterton E 10/27/2015

## 2015-10-27 NOTE — Progress Notes (Signed)
  [facility] Progress Note   Subjective: creatinine up to 13.9 today.  No new c/o's.    Vitals:   10/26/15 1811 10/26/15 2005 10/26/15 2116 10/27/15 0509  BP: (!) 145/51 (!) 151/68  (!) 117/57  Pulse: 72 80  77  Resp: 18 20  (!) 21  Temp: 98.2 F (36.8 C) 98.6 F (37 C)  99 F (37.2 C)  TempSrc: Oral     SpO2: 99% 97%  97%  Weight:   (!) 156 kg (344 lb)   Height:        Inpatient medications: . amoxicillin-clavulanate  1 tablet Oral Q24H  . buPROPion  150 mg Oral Daily  . heparin subcutaneous  5,000 Units Subcutaneous Q8H  . insulin aspart  0-20 Units Subcutaneous TID WC  . insulin aspart  0-5 Units Subcutaneous QHS  . insulin aspart  12 Units Subcutaneous TID WC  . insulin glargine  25 Units Subcutaneous QHS  . nystatin  5 mL Oral QID  . pantoprazole  40 mg Oral BID  . senna  2 tablet Oral BID  . sertraline  25 mg Oral Daily  . sodium chloride flush  3 mL Intravenous Q12H   . sodium chloride 50 mL/hr at 10/26/15 1400   albuterol, diphenhydrAMINE, HYDROmorphone (DILAUDID) injection, ondansetron **OR** ondansetron (ZOFRAN) IV, oxyCODONE, phenol, promethazine, senna-docusate  Exam: Alert, no distress, lying prone No jvd Chest clear bilat RRR no mrg Abd obese no CVAT Sacral open wound w VAC system in place No LE edema, no UE edema Neuro NF, ox3 no asterixis   Renal US - 16/ 16 cm kidneys, no hydro UA 9/14 > few bact, 30 prot, 0-5 wbc, 6-30 epis, no RBC, 1.029     Assessment: 1. Acute renal failure - prob ATN  (sepsis/ borderline hypotension/ large NSAID's at home/ large Vanc load on admission (6 gm load first 24hrs)). Baseline creat 0.7 on admission. Creat up to 13.9 today, will plan initiation of HD today.  Have asked IR to place temp cath and plan HD later today. 2. Sacral abscess - sp I&D/wound vac, on augmentin.  3. DM type 2   Plan - as above   Vinson Moselleob Justen Fonda MD [facility] pager [phone]    cell [phone] 10/27/2015,  11:09 AM    Recent Labs Lab 10/25/15 0813 10/26/15 0419 10/27/15 0530  NA 133* 134* 134*  K 4.2 4.4 4.2  CL 104 104 103  CO2 16* 15* 16*  GLUCOSE 99 104* 119*  BUN 58* 62* 67*  CREATININE 11.36* 12.68* 13.94*  CALCIUM 8.1* 8.1* 8.1*   No results for input(s): AST, ALT, ALKPHOS, BILITOT, PROT, ALBUMIN in the last 168 hours.  Recent Labs Lab 10/21/15 0518 10/23/15 0502 10/24/15 0519  WBC 11.2* 10.9* 10.5  HGB 9.4* 10.0* 10.1*  HCT 26.5* 28.7* 28.3*  MCV 80.5 82.5 80.6  PLT 229 221 197   Iron/TIBC/Ferritin/ %Sat No results found for: IRON, TIBC, FERRITIN, IRONPCTSAT

## 2015-10-27 NOTE — Progress Notes (Signed)
PROGRESS NOTE                                                                                                                                                                                                             Patient Demographics:    [name], is a 45 y.o. female, DOB - [birth year], WUJ:[id number]  Admit date - 10/19/2015   Admitting Physician Joseph Art, DO  Outpatient Primary MD for the patient is Verlon Au, MD  LOS - 8  Outpatient Specialists:None  Chief Complaint  Patient presents with  . Cellulitis       Brief Narrative   45 year old morbidly obese female with poorly controlled diabetes seen in the ED 3 times in the past 2 weeks for cellulitis of the perirectal area without abscess. She was sent home on Keflex but returned back to the ED stating worse. In the ED she was found to be septic with worsened cellulitis with necrotizing soft tissue infection . She was started on empiric IV vancomycin and Zosyn. Patient underwent I&D of the skin, subcutaneous tissue, fascia of presacral soft tissue on 9/15, followed by wound VAC placement. Patient developed progressive acute kidney injury and transferred to [facility] for possible need of dialysis.   Subjective:   . Renal function continues to worsen despite IV fluids and  decent urine output.   Assessment  & Plan :    Principal Problem:   Necrotizing soft tissue infection (HCC) Status post I&D. Cultures growing viridans strep and Prevotella. Antibiotics narrowed to Augmentin (day 10/14) . Continue wound VAC (management per surgery)  Active Problems: Acute kidney injury Suspect ATN due to sepsis vs dehydration. Also received IV vancomycin upon admission and IV contrast on 9/8. Marland Kitchen  Renal ultrasound without urinary retention or hydronephrosis. Renal function continues to worsen despite IV hydration.  Plan to start pt on HD. Rt IJ placed  by IR today.    Diabetes mellitus, uncontrolled (HCC) Uncontrolled with A1c of 11.7. Stable on Lantus 25 units daily at bedtime and sliding scale coverage.    Morbid obesity (HCC)        Code Status : Full code  Family Communication  : no one at bedside  Disposition Plan  : Home once general function improved  Barriers For Discharge : Acute renal failure  Consults  :  Surgery Renal  Procedures  :  Renal ultrasound I&D Rt IJ  DVT Prophylaxis  :  Heparin  Lab Results  Component Value Date   PLT 197 10/24/2015    Antibiotics  :    Anti-infectives    Start     Dose/Rate Route Frequency Ordered Stop   10/25/15 1200  amoxicillin-clavulanate (AUGMENTIN) 500-125 MG per tablet 500 mg     1 tablet Oral Every 24 hours 10/25/15 0939 11/01/15 1759   10/22/15 1300  cefTRIAXone (ROCEPHIN) 1 g in dextrose 5 % 50 mL IVPB  Status:  Discontinued     1 g 100 mL/hr over 30 Minutes Intravenous Every 24 hours 10/22/15 1245 10/25/15 0939   10/21/15 1200  doxycycline (VIBRA-TABS) tablet 100 mg  Status:  Discontinued     100 mg Oral Every 12 hours 10/21/15 0859 10/22/15 1246   10/19/15 2000  vancomycin (VANCOCIN) 1,250 mg in sodium chloride 0.9 % 250 mL IVPB  Status:  Discontinued     1,250 mg 166.7 mL/hr over 90 Minutes Intravenous Every 8 hours 10/19/15 1255 10/20/15 2014   10/19/15 1800  piperacillin-tazobactam (ZOSYN) IVPB 3.375 g  Status:  Discontinued     3.375 g 12.5 mL/hr over 240 Minutes Intravenous Every 8 hours 10/19/15 1255 10/21/15 0734   10/19/15 1030  vancomycin (VANCOCIN) 2,500 mg in sodium chloride 0.9 % 500 mL IVPB     2,500 mg 250 mL/hr over 120 Minutes Intravenous  Once 10/19/15 1007 10/19/15 1304   10/19/15 1015  piperacillin-tazobactam (ZOSYN) IVPB 3.375 g     3.375 g 100 mL/hr over 30 Minutes Intravenous STAT 10/19/15 1007 10/19/15 1128        Objective:   Vitals:   10/26/15 1811 10/26/15 2005 10/26/15 2116 10/27/15 0509  BP: (!) 145/51 (!) 151/68  (!)  117/57  Pulse: 72 80  77  Resp: 18 20  (!) 21  Temp: 98.2 F (36.8 C) 98.6 F (37 C)  99 F (37.2 C)  TempSrc: Oral     SpO2: 99% 97%  97%  Weight:   (!) 156 kg (344 lb)   Height:        Wt Readings from Last 3 Encounters:  10/26/15 (!) 156 kg (344 lb)  10/17/15 (!) 140.6 kg (310 lb)  10/13/15 (!) 140.6 kg (310 lb)     Intake/Output Summary (Last 24 hours) at 10/27/15 1401 Last data filed at 10/27/15 [id number]  Gross per 24 hour  Intake              360 ml  Output             1550 ml  Net            -1190 ml     Physical Exam  Gen: not in distress HEENT: moist mucosa, supple neck Chest: clear b/l, no added sounds CVS: N S1&S2, no murmurs,  GI: soft, NT, ND, BS+ Musculoskeletal: warm, no edema, gluteal wound VAC in place     Data Review:    CBC  Recent Labs Lab 10/21/15 0518 10/23/15 0502 10/24/15 0519  WBC 11.2* 10.9* 10.5  HGB 9.4* 10.0* 10.1*  HCT 26.5* 28.7* 28.3*  PLT 229 221 197  MCV 80.5 82.5 80.6  MCH 28.6 28.7 28.8  MCHC 35.5 34.8 35.7  RDW 12.9 13.1 13.2    Chemistries   Recent Labs Lab 10/23/15 0502 10/24/15 0519 10/25/15 0813 10/26/15 0419 10/27/15 0530  NA 133* 133* 133* 134* 134*  K 4.1 4.5 4.2 4.4 4.2  CL 103 104 104 104 103  CO2 18* 14* 16* 15* 16*  GLUCOSE 109* 160* 99 104* 119*  BUN 49* 56* 58* 62* 67*  CREATININE 7.84* 9.53* 11.36* 12.68* 13.94*  CALCIUM 8.0* 8.0* 8.1* 8.1* 8.1*   ------------------------------------------------------------------------------------------------------------------ No results for input(s): CHOL, HDL, LDLCALC, TRIG, CHOLHDL, LDLDIRECT in the last 72 hours.  Lab Results  Component Value Date   HGBA1C 11.7 (H) 10/19/2015   ------------------------------------------------------------------------------------------------------------------ No results for input(s): TSH, T4TOTAL, T3FREE, THYROIDAB in the last 72 hours.  Invalid input(s):  FREET3 ------------------------------------------------------------------------------------------------------------------ No results for input(s): VITAMINB12, FOLATE, FERRITIN, TIBC, IRON, RETICCTPCT in the last 72 hours.  Coagulation profile No results for input(s): INR, PROTIME in the last 168 hours.  No results for input(s): DDIMER in the last 72 hours.  Cardiac Enzymes No results for input(s): CKMB, TROPONINI, MYOGLOBIN in the last 168 hours.  Invalid input(s): CK ------------------------------------------------------------------------------------------------------------------    Component Value Date/Time   BNP 69.1 03/16/2015 1145    Inpatient Medications  Scheduled Meds: . amoxicillin-clavulanate  1 tablet Oral Q24H  . buPROPion  150 mg Oral Daily  . heparin      . heparin subcutaneous  5,000 Units Subcutaneous Q8H  . insulin aspart  0-20 Units Subcutaneous TID WC  . insulin aspart  0-5 Units Subcutaneous QHS  . insulin aspart  12 Units Subcutaneous TID WC  . insulin glargine  25 Units Subcutaneous QHS  . lidocaine      . nystatin  5 mL Oral QID  . pantoprazole  40 mg Oral BID  . senna  2 tablet Oral BID  . sertraline  25 mg Oral Daily  . sodium chloride flush  3 mL Intravenous Q12H   Continuous Infusions: . sodium chloride 50 mL/hr at 10/26/15 1400   PRN Meds:.albuterol, diphenhydrAMINE, HYDROmorphone (DILAUDID) injection, lidocaine, ondansetron **OR** ondansetron (ZOFRAN) IV, oxyCODONE, phenol, promethazine, senna-docusate  Micro Results Recent Results (from the past 240 hour(s))  Blood Culture (routine x 2)     Status: None   Collection Time: 10/19/15 10:10 AM  Result Value Ref Range Status   Specimen Description BLOOD LEFT ARM  Final   Special Requests BOTTLES DRAWN AEROBIC AND ANAEROBIC  Final   Culture   Final    NO GROWTH 5 DAYS Performed at [facility]    Report Status 10/24/2015 FINAL  Final  Blood Culture (routine x 2)     Status: None    Collection Time: 10/19/15 10:24 AM  Result Value Ref Range Status   Specimen Description BLOOD LEFT HAND  Final   Special Requests IN PEDIATRIC BOTTLE  Final   Culture   Final    NO GROWTH 5 DAYS Performed at [facility]    Report Status 10/24/2015 FINAL  Final  Aerobic/Anaerobic Culture (surgical/deep wound)     Status: None   Collection Time: 10/19/15  1:49 PM  Result Value Ref Range Status   Specimen Description ABSCESS PRESACRAL  Final   Special Requests NONE  Final   Gram Stain   Final    ABUNDANT WBC PRESENT,BOTH PMN AND MONONUCLEAR ABUNDANT GRAM NEGATIVE RODS ABUNDANT GRAM POSITIVE COCCI IN PAIRS AND CHAINS FEW GRAM POSITIVE RODS    Culture   Final    ABUNDANT VIRIDANS STREPTOCOCCUS RARE STAPHYLOCOCCUS SPECIES (COAGULASE NEGATIVE) ABUNDANT PREVOTELLA BIVIA BETA LACTAMASE POSITIVE Performed at [facility]    Report Status 10/23/2015 FINAL  Final   Organism ID, Bacteria VIRIDANS  STREPTOCOCCUS  Final      Susceptibility   Viridans streptococcus - MIC*    PENICILLIN <=0.06 SENSITIVE Sensitive     CEFTRIAXONE <=0.12 SENSITIVE Sensitive     ERYTHROMYCIN <=0.12 SENSITIVE Sensitive     LEVOFLOXACIN 1 SENSITIVE Sensitive     VANCOMYCIN 0.5 SENSITIVE Sensitive     * ABUNDANT VIRIDANS STREPTOCOCCUS  MRSA PCR Screening     Status: Abnormal   Collection Time: 10/19/15  4:12 PM  Result Value Ref Range Status   MRSA by PCR POSITIVE (A) NEGATIVE Final    Comment:        The GeneXpert MRSA Assay (FDA approved for NASAL specimens only), is one component of a comprehensive MRSA colonization surveillance program. It is not intended to diagnose MRSA infection nor to guide or monitor treatment for MRSA infections. RESULT CALLED TO, READ BACK BY AND VERIFIED WITH: DUNKELBERGER,K AT 1821 ON [id number] BY MOSLEY,J   Urine culture     Status: None   Collection Time: 10/19/15  5:05 PM  Result Value Ref Range Status   Specimen Description URINE, RANDOM  Final    Special Requests NONE  Final   Culture NO GROWTH Performed at [facility]   Final   Report Status 10/20/2015 FINAL  Final    Radiology Reports Ct Abdomen Pelvis W Contrast  Result Date: 10/13/2015 CLINICAL DATA:  Possible perirectal abscess. Further evaluation requested. Initial encounter. EXAM: CT ABDOMEN AND PELVIS WITH CONTRAST TECHNIQUE: Multidetector CT imaging of the abdomen and pelvis was performed using the standard protocol following bolus administration of intravenous contrast. CONTRAST:  ISOVUE-300 IOPAMIDOL (ISOVUE-300) INJECTION 61% COMPARISON:  CT of the abdomen and pelvis from 06/21/2014 FINDINGS: Lower chest: The visualized lung bases are grossly clear. The visualized portions of the mediastinum are unremarkable. Hepatobiliary: The liver is unremarkable in appearance. Stones are noted dependently within the gallbladder. The gallbladder is otherwise unremarkable. The common bile duct remains normal in caliber. Pancreas: The pancreas is within normal limits. Spleen: The spleen is unremarkable in appearance. Adrenals/Urinary Tract: The adrenal glands are unremarkable in appearance. The kidneys are within normal limits. There is no evidence of hydronephrosis. No renal or ureteral stones are identified. No perinephric stranding is seen. Stomach/Bowel: The stomach is unremarkable in appearance. The small bowel is within normal limits. The appendix is not visualized; there is no evidence for appendicitis. The colon is unremarkable in appearance. Vascular/Lymphatic: The abdominal aorta is unremarkable in appearance. The inferior vena cava is grossly unremarkable. No retroperitoneal lymphadenopathy is seen. No pelvic sidewall lymphadenopathy is identified. Reproductive: The bladder is decompressed and not well assessed. The uterus contains a large 8.6 cm fibroid, with minimal calcification. No suspicious adnexal masses are seen. The ovaries are grossly symmetric. There is mild apparent  soft tissue prominence at the cervix and vagina, of uncertain significance. If the patient's symptoms persist, pelvic ultrasound would be helpful for further evaluation. The anorectal canal is unremarkable in appearance. There is no evidence of perirectal abscess. Other: Minimal nonspecific skin thickening is noted at the anterior left mid abdomen. Musculoskeletal: No acute osseous abnormalities are identified. The visualized musculature is unremarkable in appearance. IMPRESSION: 1. No acute abnormality seen to explain the patient's symptoms. No evidence of perirectal abscess. The anorectal canal is unremarkable in appearance. 2. Mild apparent soft tissue prominence at the cervix and vagina, of uncertain significance. If the patient's symptoms persist, pelvic ultrasound would be helpful for further evaluation. 3. Cholelithiasis.  Gallbladder otherwise unremarkable. 4. Large 8.6  cm uterine fibroid noted. Electronically Signed   By: Roanna Raider M.D.   On: 10/13/2015 23:05   US Renal  Result Date: 10/21/2015 CLINICAL DATA:  Acute renal failure. EXAM: RENAL / URINARY TRACT ULTRASOUND COMPLETE COMPARISON:  None. FINDINGS: Right Kidney: Length: 16.6 cm. Echogenicity within normal limits. No mass or hydronephrosis visualized. Left Kidney: Length: 15.9 cm. Echogenicity within normal limits. No mass or hydronephrosis visualized. Bladder: Appears normal for degree of bladder distention. IMPRESSION: 1. Relatively large kidneys which may be normal variant for this patient. 2. No other abnormality.  No hydronephrosis. Electronically Signed   By: Amie Portland M.D.   On: 10/21/2015 12:30   Ir Fluoro Guide Cv Line Right  Result Date: 10/27/2015 INDICATION: 45 year old female with a history of acute renal failure. EXAM: TUNNELED CENTRAL VENOUS HEMODIALYSIS CATHETER PLACEMENT WITH ULTRASOUND AND FLUOROSCOPIC GUIDANCE MEDICATIONS: None ANESTHESIA/SEDATION: None FLUOROSCOPY TIME:  Fluoroscopy Time: 0 minutes 6 seconds (19.4  mGy). COMPLICATIONS: None PROCEDURE: Informed written consent was obtained from the patient and the patient's family after a thorough discussion of the procedural risks, benefits and alternatives. All questions were addressed. A timeout was performed prior to the initiation of the procedure. The right neck and chest was prepped with chlorhexidine, and draped in the usual sterile fashion using maximum barrier technique (cap and mask, sterile gown, sterile gloves, large sterile sheet, hand hygiene and cutaneous antiseptic). Local anesthesia was attained by infiltration with 1% lidocaine without epinephrine. Ultrasound demonstrated patency of the right internal jugular vein, and this was documented with an image. Under real-time ultrasound guidance, this vein was accessed with a 21 gauge micropuncture needle and image documentation was performed. A small dermatotomy was made at the access site with an 11 scalpel. A 0.018" wire was advanced into the SVC and the access needle exchanged for a 19F micropuncture vascular sheath. The 0.018" wire was then removed and a 0.035" wire advanced into the IVC. Upon withdrawal of the 018 wire, the wire was marked for appropriate length of the internal portion of the catheter. A 20 cm catheter was selected. Skin and subcutaneous tissues were serially dilated. Catheter was placed on the wire. The catheter tip is positioned in the upper right atrium. This was documented with a spot image. Both ports of the hemodialysis catheter were then tested for excellent function. The ports were then locked with heparinized lock. Patient tolerated the procedure well and remained hemodynamically stable throughout. No complications were encountered and no significant blood loss was encountered. IMPRESSION: Status post right IJ temporary HD catheter placement. Catheter ready for use. Signed, Yvone Neu. Loreta Ave, DO [facility] Radiology Electronically Signed    By: Gilmer Mor D.O.   On: 10/27/2015 13:38   Ir US Guide Vasc Access Right  Result Date: 10/27/2015 INDICATION: 45 year old female with a history of acute renal failure. EXAM: TUNNELED CENTRAL VENOUS HEMODIALYSIS CATHETER PLACEMENT WITH ULTRASOUND AND FLUOROSCOPIC GUIDANCE MEDICATIONS: None ANESTHESIA/SEDATION: None FLUOROSCOPY TIME:  Fluoroscopy Time: 0 minutes 6 seconds (19.4 mGy). COMPLICATIONS: None PROCEDURE: Informed written consent was obtained from the patient and the patient's family after a thorough discussion of the procedural risks, benefits and alternatives. All questions were addressed. A timeout was performed prior to the initiation of the procedure. The right neck and chest was prepped with chlorhexidine, and draped in the usual sterile fashion using maximum barrier technique (cap and mask, sterile gown, sterile gloves, large sterile sheet, hand hygiene and cutaneous antiseptic). Local anesthesia was attained by infiltration with  1% lidocaine without epinephrine. Ultrasound demonstrated patency of the right internal jugular vein, and this was documented with an image. Under real-time ultrasound guidance, this vein was accessed with a 21 gauge micropuncture needle and image documentation was performed. A small dermatotomy was made at the access site with an 11 scalpel. A 0.018" wire was advanced into the SVC and the access needle exchanged for a 56F micropuncture vascular sheath. The 0.018" wire was then removed and a 0.035" wire advanced into the IVC. Upon withdrawal of the 018 wire, the wire was marked for appropriate length of the internal portion of the catheter. A 20 cm catheter was selected. Skin and subcutaneous tissues were serially dilated. Catheter was placed on the wire. The catheter tip is positioned in the upper right atrium. This was documented with a spot image. Both ports of the hemodialysis catheter were then tested for excellent function. The ports were then locked with  heparinized lock. Patient tolerated the procedure well and remained hemodynamically stable throughout. No complications were encountered and no significant blood loss was encountered. IMPRESSION: Status post right IJ temporary HD catheter placement. Catheter ready for use. Signed, Yvone Neu. Loreta Ave, DO [facility] Radiology Electronically Signed   By: Gilmer Mor D.O.   On: 10/27/2015 13:38    Time Spent in minutes  25   Eddie North M.D on 10/27/2015 at 2:01 PM  Between 7am to 7pm - Pager - [phone]  After 7pm go to [email or web address] - password [facility]  [facility] -  Office  [phone]

## 2015-10-28 LAB — CBC
HCT: 25.3 % — ABNORMAL LOW (ref 36.0–46.0)
HEMOGLOBIN: 8.7 g/dL — AB (ref 12.0–15.0)
MCH: 28.6 pg (ref 26.0–34.0)
MCHC: 34.4 g/dL (ref 30.0–36.0)
MCV: 83.2 fL (ref 78.0–100.0)
Platelets: 214 10*3/uL (ref 150–400)
RBC: 3.04 MIL/uL — AB (ref 3.87–5.11)
RDW: 13.2 % (ref 11.5–15.5)
WBC: 11.4 10*3/uL — AB (ref 4.0–10.5)

## 2015-10-28 LAB — BASIC METABOLIC PANEL
ANION GAP: 15 (ref 5–15)
BUN: 34 mg/dL — ABNORMAL HIGH (ref 6–20)
CALCIUM: 8.1 mg/dL — AB (ref 8.9–10.3)
CO2: 22 mmol/L (ref 22–32)
Chloride: 102 mmol/L (ref 101–111)
Creatinine, Ser: 9.29 mg/dL — ABNORMAL HIGH (ref 0.44–1.00)
GFR calc non Af Amer: 4 mL/min — ABNORMAL LOW (ref >60)
GFR, EST AFRICAN AMERICAN: 5 mL/min — AB (ref >60)
Glucose, Bld: 75 mg/dL (ref 65–99)
Potassium: 3.8 mmol/L (ref 3.5–5.1)
SODIUM: 139 mmol/L (ref 135–145)

## 2015-10-28 LAB — GLUCOSE, CAPILLARY
GLUCOSE-CAPILLARY: 56 mg/dL — AB (ref 65–99)
GLUCOSE-CAPILLARY: 83 mg/dL (ref 65–99)
GLUCOSE-CAPILLARY: 126 mg/dL — AB (ref 65–99)
Glucose-Capillary: 72 mg/dL (ref 65–99)

## 2015-10-28 LAB — HEPATITIS B SURFACE ANTIGEN: Hepatitis B Surface Ag: NEGATIVE

## 2015-10-28 LAB — HEPATITIS B CORE ANTIBODY, TOTAL: HEP B C TOTAL AB: NEGATIVE

## 2015-10-28 LAB — HEPATITIS B SURFACE ANTIBODY, QUANTITATIVE: Hepatitis B-Post: 3.1 m[IU]/mL — ABNORMAL LOW (ref >9.9)

## 2015-10-28 MED ORDER — OXYCODONE HCL 5 MG PO TABS
ORAL_TABLET | ORAL | Status: AC
  Administered 2015-10-28: 10 mg via ORAL
  Filled 2015-10-28: qty 2

## 2015-10-28 NOTE — Progress Notes (Signed)
PROGRESS NOTE                                                                                                                                                                                                             Patient Demographics:    [name], is a 45 y.o. female, DOB - 07/02/1970, ZOX:[id number]  Admit date - 10/19/2015   Admitting Physician Joseph Art, DO  Outpatient Primary MD for the patient is Verlon Au, MD  LOS - 9  Outpatient Specialists:None  Chief Complaint  Patient presents with  . Cellulitis       Brief Narrative   45 year old morbidly obese female with poorly controlled diabetes seen in the ED 3 times in the past 2 weeks for cellulitis of the perirectal area without abscess. She was sent home on Keflex but returned back to the ED stating worse. In the ED she was found to be septic with worsened cellulitis with necrotizing soft tissue infection . She was started on empiric IV vancomycin and Zosyn. Patient underwent I&D of the skin, subcutaneous tissue, fascia of presacral soft tissue on 9/15, followed by wound VAC placement. Patient developed progressive acute kidney injury and transferred to [facility] for need of dialysis.   Subjective:   Right  IJ placed and started on HD yesterday. Tolerated well.    Assessment  & Plan :    Principal Problem:   Necrotizing soft tissue infection (HCC) Status post I&D. Cultures growing viridans strep and Prevotella. Antibiotics narrowed to Augmentin (day 11/14) . Continue wound VAC (management per surgery)  Active Problems: Acute kidney injury Suspect ATN due to sepsis vs dehydration. Also received IV vancomycin upon admission and IV contrast on 9/8. Marland Kitchen  Renal ultrasound without urinary retention or hydronephrosis. Renal function continued to worsen despite IV hydration.  Rt IJ placed by IR and started HD on 9/22. tolerated well.  Possible renal bx next week.  HIV ab and hep B ag negative. Check HCV.    Diabetes mellitus, uncontrolled (HCC) Uncontrolled with A1c of 11.7. Stable on Lantus 25 units daily at bedtime and sliding scale coverage.    Morbid obesity (HCC)        Code Status : Full code  Family Communication  : no one at bedside  Disposition Plan  : Home once renal fn improved  Barriers For Discharge : Acute renal failure needing HD  Consults  :   Surgery Renal  Procedures  :  Renal ultrasound I&D Rt IJ  DVT Prophylaxis  :  Heparin  Lab Results  Component Value Date   PLT 214 10/28/2015    Antibiotics  :    Anti-infectives    Start     Dose/Rate Route Frequency Ordered Stop   10/25/15 1200  amoxicillin-clavulanate (AUGMENTIN) 500-125 MG per tablet 500 mg     1 tablet Oral Every 24 hours 10/25/15 0939 11/01/15 1759   10/22/15 1300  cefTRIAXone (ROCEPHIN) 1 g in dextrose 5 % 50 mL IVPB  Status:  Discontinued     1 g 100 mL/hr over 30 Minutes Intravenous Every 24 hours 10/22/15 1245 10/25/15 0939   10/21/15 1200  doxycycline (VIBRA-TABS) tablet 100 mg  Status:  Discontinued     100 mg Oral Every 12 hours 10/21/15 0859 10/22/15 1246   10/19/15 2000  vancomycin (VANCOCIN) 1,250 mg in sodium chloride 0.9 % 250 mL IVPB  Status:  Discontinued     1,250 mg 166.7 mL/hr over 90 Minutes Intravenous Every 8 hours 10/19/15 1255 10/20/15 2014   10/19/15 1800  piperacillin-tazobactam (ZOSYN) IVPB 3.375 g  Status:  Discontinued     3.375 g 12.5 mL/hr over 240 Minutes Intravenous Every 8 hours 10/19/15 1255 10/21/15 0734   10/19/15 1030  vancomycin (VANCOCIN) 2,500 mg in sodium chloride 0.9 % 500 mL IVPB     2,500 mg 250 mL/hr over 120 Minutes Intravenous  Once 10/19/15 1007 10/19/15 1304   10/19/15 1015  piperacillin-tazobactam (ZOSYN) IVPB 3.375 g     3.375 g 100 mL/hr over 30 Minutes Intravenous STAT 10/19/15 1007 10/19/15 1128        Objective:   Vitals:   10/27/15 2037 10/28/15  0423 10/28/15 0555 10/28/15 0929  BP: (!) 132/57  (!) 139/50 (!) 99/50  Pulse: 90  78 73  Resp: 18  19 19   Temp: (!) 100.4 F (38 C)  99.2 F (37.3 C) 98.7 F (37.1 C)  TempSrc: Oral  Oral Oral  SpO2: 92%  96% 96%  Weight: (!) 147.9 kg (326 lb 1 oz) (!) 147.9 kg (326 lb 1 oz)    Height:        Wt Readings from Last 3 Encounters:  10/28/15 (!) 147.9 kg (326 lb 1 oz)  10/17/15 (!) 140.6 kg (310 lb)  10/13/15 (!) 140.6 kg (310 lb)     Intake/Output Summary (Last 24 hours) at 10/28/15 1229 Last data filed at 10/28/15 0930  Gross per 24 hour  Intake              720 ml  Output             3825 ml  Net            -3105 ml     Physical Exam  Gen: not in distress HEENT: moist mucosa, supple neck, rt IJ Chest: clear b/l, no added sounds CVS: N S1&S2, no murmurs,  GI: soft, NT, ND Musculoskeletal: warm, no edema, gluteal wound VAC in place     Data Review:    CBC  Recent Labs Lab 10/23/15 0502 10/24/15 0519 10/28/15 0330  WBC 10.9* 10.5 11.4*  HGB 10.0* 10.1* 8.7*  HCT 28.7* 28.3* 25.3*  PLT 221 197 214  MCV 82.5 80.6 83.2  MCH 28.7 28.8 28.6  MCHC 34.8 35.7 34.4  RDW 13.1 13.2 13.2  Chemistries   Recent Labs Lab 10/24/15 0519 10/25/15 0813 10/26/15 0419 10/27/15 0530 10/28/15 0330  NA 133* 133* 134* 134* 139  K 4.5 4.2 4.4 4.2 3.8  CL 104 104 104 103 102  CO2 14* 16* 15* 16* 22  GLUCOSE 160* 99 104* 119* 75  BUN 56* 58* 62* 67* 34*  CREATININE 9.53* 11.36* 12.68* 13.94* 9.29*  CALCIUM 8.0* 8.1* 8.1* 8.1* 8.1*   ------------------------------------------------------------------------------------------------------------------ No results for input(s): CHOL, HDL, LDLCALC, TRIG, CHOLHDL, LDLDIRECT in the last 72 hours.  Lab Results  Component Value Date   HGBA1C 11.7 (H) 10/19/2015   ------------------------------------------------------------------------------------------------------------------ No results for input(s): TSH, T4TOTAL, T3FREE,  THYROIDAB in the last 72 hours.  Invalid input(s): FREET3 ------------------------------------------------------------------------------------------------------------------ No results for input(s): VITAMINB12, FOLATE, FERRITIN, TIBC, IRON, RETICCTPCT in the last 72 hours.  Coagulation profile No results for input(s): INR, PROTIME in the last 168 hours.  No results for input(s): DDIMER in the last 72 hours.  Cardiac Enzymes No results for input(s): CKMB, TROPONINI, MYOGLOBIN in the last 168 hours.  Invalid input(s): CK ------------------------------------------------------------------------------------------------------------------    Component Value Date/Time   BNP 69.1 03/16/2015 1145    Inpatient Medications  Scheduled Meds: . amoxicillin-clavulanate  1 tablet Oral Q24H  . buPROPion  150 mg Oral Daily  . heparin subcutaneous  5,000 Units Subcutaneous Q8H  . insulin aspart  0-20 Units Subcutaneous TID WC  . insulin aspart  0-5 Units Subcutaneous QHS  . insulin aspart  12 Units Subcutaneous TID WC  . insulin glargine  25 Units Subcutaneous QHS  . nystatin  5 mL Oral QID  . pantoprazole  40 mg Oral BID  . senna  2 tablet Oral BID  . sertraline  25 mg Oral Daily  . sodium chloride flush  3 mL Intravenous Q12H   Continuous Infusions:   PRN Meds:.albuterol, diphenhydrAMINE, HYDROmorphone (DILAUDID) injection, lidocaine, ondansetron **OR** ondansetron (ZOFRAN) IV, oxyCODONE, phenol, promethazine, senna-docusate  Micro Results Recent Results (from the past 240 hour(s))  Blood Culture (routine x 2)     Status: None   Collection Time: 10/19/15 10:10 AM  Result Value Ref Range Status   Specimen Description BLOOD LEFT ARM  Final   Special Requests BOTTLES DRAWN AEROBIC AND ANAEROBIC  Final   Culture   Final    NO GROWTH 5 DAYS Performed at [facility]    Report Status 10/24/2015 FINAL  Final  Blood Culture (routine x 2)     Status: None   Collection Time:  10/19/15 10:24 AM  Result Value Ref Range Status   Specimen Description BLOOD LEFT HAND  Final   Special Requests IN PEDIATRIC BOTTLE  Final   Culture   Final    NO GROWTH 5 DAYS Performed at [facility]    Report Status 10/24/2015 FINAL  Final  Aerobic/Anaerobic Culture (surgical/deep wound)     Status: None   Collection Time: 10/19/15  1:49 PM  Result Value Ref Range Status   Specimen Description ABSCESS PRESACRAL  Final   Special Requests NONE  Final   Gram Stain   Final    ABUNDANT WBC PRESENT,BOTH PMN AND MONONUCLEAR ABUNDANT GRAM NEGATIVE RODS ABUNDANT GRAM POSITIVE COCCI IN PAIRS AND CHAINS FEW GRAM POSITIVE RODS    Culture   Final    ABUNDANT VIRIDANS STREPTOCOCCUS RARE STAPHYLOCOCCUS SPECIES (COAGULASE NEGATIVE) ABUNDANT PREVOTELLA BIVIA BETA LACTAMASE POSITIVE Performed at [facility]    Report Status 10/23/2015 FINAL  Final   Organism ID,  Bacteria VIRIDANS STREPTOCOCCUS  Final      Susceptibility   Viridans streptococcus - MIC*    PENICILLIN <=0.06 SENSITIVE Sensitive     CEFTRIAXONE <=0.12 SENSITIVE Sensitive     ERYTHROMYCIN <=0.12 SENSITIVE Sensitive     LEVOFLOXACIN 1 SENSITIVE Sensitive     VANCOMYCIN 0.5 SENSITIVE Sensitive     * ABUNDANT VIRIDANS STREPTOCOCCUS  MRSA PCR Screening     Status: Abnormal   Collection Time: 10/19/15  4:12 PM  Result Value Ref Range Status   MRSA by PCR POSITIVE (A) NEGATIVE Final    Comment:        The GeneXpert MRSA Assay (FDA approved for NASAL specimens only), is one component of a comprehensive MRSA colonization surveillance program. It is not intended to diagnose MRSA infection nor to guide or monitor treatment for MRSA infections. RESULT CALLED TO, READ BACK BY AND VERIFIED WITH: DUNKELBERGER,K AT 1821 ON [id number] BY MOSLEY,J   Urine culture     Status: None   Collection Time: 10/19/15  5:05 PM  Result Value Ref Range Status   Specimen Description URINE, RANDOM  Final   Special Requests  NONE  Final   Culture NO GROWTH Performed at [facility]   Final   Report Status 10/20/2015 FINAL  Final    Radiology Reports Ct Abdomen Pelvis W Contrast  Result Date: 10/13/2015 CLINICAL DATA:  Possible perirectal abscess. Further evaluation requested. Initial encounter. EXAM: CT ABDOMEN AND PELVIS WITH CONTRAST TECHNIQUE: Multidetector CT imaging of the abdomen and pelvis was performed using the standard protocol following bolus administration of intravenous contrast. CONTRAST:  ISOVUE-300 IOPAMIDOL (ISOVUE-300) INJECTION 61% COMPARISON:  CT of the abdomen and pelvis from 06/21/2014 FINDINGS: Lower chest: The visualized lung bases are grossly clear. The visualized portions of the mediastinum are unremarkable. Hepatobiliary: The liver is unremarkable in appearance. Stones are noted dependently within the gallbladder. The gallbladder is otherwise unremarkable. The common bile duct remains normal in caliber. Pancreas: The pancreas is within normal limits. Spleen: The spleen is unremarkable in appearance. Adrenals/Urinary Tract: The adrenal glands are unremarkable in appearance. The kidneys are within normal limits. There is no evidence of hydronephrosis. No renal or ureteral stones are identified. No perinephric stranding is seen. Stomach/Bowel: The stomach is unremarkable in appearance. The small bowel is within normal limits. The appendix is not visualized; there is no evidence for appendicitis. The colon is unremarkable in appearance. Vascular/Lymphatic: The abdominal aorta is unremarkable in appearance. The inferior vena cava is grossly unremarkable. No retroperitoneal lymphadenopathy is seen. No pelvic sidewall lymphadenopathy is identified. Reproductive: The bladder is decompressed and not well assessed. The uterus contains a large 8.6 cm fibroid, with minimal calcification. No suspicious adnexal masses are seen. The ovaries are grossly symmetric. There is mild apparent soft tissue  prominence at the cervix and vagina, of uncertain significance. If the patient's symptoms persist, pelvic ultrasound would be helpful for further evaluation. The anorectal canal is unremarkable in appearance. There is no evidence of perirectal abscess. Other: Minimal nonspecific skin thickening is noted at the anterior left mid abdomen. Musculoskeletal: No acute osseous abnormalities are identified. The visualized musculature is unremarkable in appearance. IMPRESSION: 1. No acute abnormality seen to explain the patient's symptoms. No evidence of perirectal abscess. The anorectal canal is unremarkable in appearance. 2. Mild apparent soft tissue prominence at the cervix and vagina, of uncertain significance. If the patient's symptoms persist, pelvic ultrasound would be helpful for further evaluation. 3. Cholelithiasis.  Gallbladder otherwise unremarkable. 4.  Large 8.6 cm uterine fibroid noted. Electronically Signed   By: Roanna Raider M.D.   On: 10/13/2015 23:05   US Renal  Result Date: 10/21/2015 CLINICAL DATA:  Acute renal failure. EXAM: RENAL / URINARY TRACT ULTRASOUND COMPLETE COMPARISON:  None. FINDINGS: Right Kidney: Length: 16.6 cm. Echogenicity within normal limits. No mass or hydronephrosis visualized. Left Kidney: Length: 15.9 cm. Echogenicity within normal limits. No mass or hydronephrosis visualized. Bladder: Appears normal for degree of bladder distention. IMPRESSION: 1. Relatively large kidneys which may be normal variant for this patient. 2. No other abnormality.  No hydronephrosis. Electronically Signed   By: Amie Portland M.D.   On: 10/21/2015 12:30   Ir Fluoro Guide Cv Line Right  Result Date: 10/27/2015 INDICATION: 45 year old female with a history of acute renal failure. EXAM: TUNNELED CENTRAL VENOUS HEMODIALYSIS CATHETER PLACEMENT WITH ULTRASOUND AND FLUOROSCOPIC GUIDANCE MEDICATIONS: None ANESTHESIA/SEDATION: None FLUOROSCOPY TIME:  Fluoroscopy Time: 0 minutes 6 seconds (19.4 mGy).  COMPLICATIONS: None PROCEDURE: Informed written consent was obtained from the patient and the patient's family after a thorough discussion of the procedural risks, benefits and alternatives. All questions were addressed. A timeout was performed prior to the initiation of the procedure. The right neck and chest was prepped with chlorhexidine, and draped in the usual sterile fashion using maximum barrier technique (cap and mask, sterile gown, sterile gloves, large sterile sheet, hand hygiene and cutaneous antiseptic). Local anesthesia was attained by infiltration with 1% lidocaine without epinephrine. Ultrasound demonstrated patency of the right internal jugular vein, and this was documented with an image. Under real-time ultrasound guidance, this vein was accessed with a 21 gauge micropuncture needle and image documentation was performed. A small dermatotomy was made at the access site with an 11 scalpel. A 0.018" wire was advanced into the SVC and the access needle exchanged for a 72F micropuncture vascular sheath. The 0.018" wire was then removed and a 0.035" wire advanced into the IVC. Upon withdrawal of the 018 wire, the wire was marked for appropriate length of the internal portion of the catheter. A 20 cm catheter was selected. Skin and subcutaneous tissues were serially dilated. Catheter was placed on the wire. The catheter tip is positioned in the upper right atrium. This was documented with a spot image. Both ports of the hemodialysis catheter were then tested for excellent function. The ports were then locked with heparinized lock. Patient tolerated the procedure well and remained hemodynamically stable throughout. No complications were encountered and no significant blood loss was encountered. IMPRESSION: Status post right IJ temporary HD catheter placement. Catheter ready for use. Signed, Yvone Neu. Loreta Ave, DO [facility] Radiology Electronically Signed   By:  Gilmer Mor D.O.   On: 10/27/2015 13:38   Ir US Guide Vasc Access Right  Result Date: 10/27/2015 INDICATION: 45 year old female with a history of acute renal failure. EXAM: TUNNELED CENTRAL VENOUS HEMODIALYSIS CATHETER PLACEMENT WITH ULTRASOUND AND FLUOROSCOPIC GUIDANCE MEDICATIONS: None ANESTHESIA/SEDATION: None FLUOROSCOPY TIME:  Fluoroscopy Time: 0 minutes 6 seconds (19.4 mGy). COMPLICATIONS: None PROCEDURE: Informed written consent was obtained from the patient and the patient's family after a thorough discussion of the procedural risks, benefits and alternatives. All questions were addressed. A timeout was performed prior to the initiation of the procedure. The right neck and chest was prepped with chlorhexidine, and draped in the usual sterile fashion using maximum barrier technique (cap and mask, sterile gown, sterile gloves, large sterile sheet, hand hygiene and cutaneous antiseptic). Local anesthesia was attained by  infiltration with 1% lidocaine without epinephrine. Ultrasound demonstrated patency of the right internal jugular vein, and this was documented with an image. Under real-time ultrasound guidance, this vein was accessed with a 21 gauge micropuncture needle and image documentation was performed. A small dermatotomy was made at the access site with an 11 scalpel. A 0.018" wire was advanced into the SVC and the access needle exchanged for a 37F micropuncture vascular sheath. The 0.018" wire was then removed and a 0.035" wire advanced into the IVC. Upon withdrawal of the 018 wire, the wire was marked for appropriate length of the internal portion of the catheter. A 20 cm catheter was selected. Skin and subcutaneous tissues were serially dilated. Catheter was placed on the wire. The catheter tip is positioned in the upper right atrium. This was documented with a spot image. Both ports of the hemodialysis catheter were then tested for excellent function. The ports were then locked with heparinized  lock. Patient tolerated the procedure well and remained hemodynamically stable throughout. No complications were encountered and no significant blood loss was encountered. IMPRESSION: Status post right IJ temporary HD catheter placement. Catheter ready for use. Signed, Yvone NeuJaime S. Loreta AveWagner, DO [facility] Electronically Signed   By: Gilmer MorJaime  Wagner D.O.   On: 10/27/2015 13:38    Time Spent in minutes  25   Eddie NorthHUNGEL, Maciel Kegg M.D on 10/28/2015 at 12:29 PM  Between 7am to 7pm - Pager - [phone]  After 7pm go to [email or web address] - password [email or web address]  [facility] -  Office  [phone]

## 2015-10-28 NOTE — Progress Notes (Signed)
  [facility] Progress Note   Subjective: Cr down w HD yesterday, no probs w HD.  2 kg removed.   Vitals:   10/27/15 2037 10/28/15 0423 10/28/15 0555 10/28/15 0929  BP: (!) 132/57  (!) 139/50 (!) 99/50  Pulse: 90  78 73  Resp: 18  19 19   Temp: (!) 100.4 F (38 C)  99.2 F (37.3 C) 98.7 F (37.1 C)  TempSrc: Oral  Oral Oral  SpO2: 92%  96% 96%  Weight: (!) 147.9 kg (326 lb 1 oz) (!) 147.9 kg (326 lb 1 oz)    Height:        Inpatient medications: . amoxicillin-clavulanate  1 tablet Oral Q24H  . buPROPion  150 mg Oral Daily  . heparin subcutaneous  5,000 Units Subcutaneous Q8H  . insulin aspart  0-20 Units Subcutaneous TID WC  . insulin aspart  0-5 Units Subcutaneous QHS  . insulin aspart  12 Units Subcutaneous TID WC  . insulin glargine  25 Units Subcutaneous QHS  . nystatin  5 mL Oral QID  . pantoprazole  40 mg Oral BID  . senna  2 tablet Oral BID  . sertraline  25 mg Oral Daily  . sodium chloride flush  3 mL Intravenous Q12H     albuterol, diphenhydrAMINE, HYDROmorphone (DILAUDID) injection, lidocaine, ondansetron **OR** ondansetron (ZOFRAN) IV, oxyCODONE, phenol, promethazine, senna-docusate  Exam: Alert, no distress, lying prone No jvd Chest clear bilat RRR no mrg Abd obese no CVAT Sacral open wound w VAC system in place No LE edema, no UE edema Neuro NF, ox3 no asterixis   Renal US - 16/ 16 cm kidneys, no hydro UA 9/14 > few bact, 30 prot, 0-5 wbc, 6-30 epis, no RBC, 1.029     Assessment: 1. Acute renal failure - prob ATN  (sepsis/ borderline hypotension/ large NSAID's at home/ large Vanc load on admission). Baseline creat 0.7 on admission. Creat peaked at 13.9, started on HD 9/22 same day.  2nd HD today.  If not recovering may consider renal biopsy.  2. Sacral abscess - sp I&D/wound vac, on augmentin.  3. DM type 2 4. Hypotension - relative low BP today, no vol excess on exam. May be dry, keep even w HD    Plan - HD #2 today, then hold and  watch for signs of recovery   Vinson Moselleob Tacy Chavis MD [facility] pager [phone]    cell [phone] 10/28/2015, 11:58 AM    Recent Labs Lab 10/26/15 0419 10/27/15 0530 10/28/15 0330  NA 134* 134* 139  K 4.4 4.2 3.8  CL 104 103 102  CO2 15* 16* 22  GLUCOSE 104* 119* 75  BUN 62* 67* 34*  CREATININE 12.68* 13.94* 9.29*  CALCIUM 8.1* 8.1* 8.1*  PHOS  --  9.7*  --    No results for input(s): AST, ALT, ALKPHOS, BILITOT, PROT, ALBUMIN in the last 168 hours.  Recent Labs Lab 10/23/15 0502 10/24/15 0519 10/28/15 0330  WBC 10.9* 10.5 11.4*  HGB 10.0* 10.1* 8.7*  HCT 28.7* 28.3* 25.3*  MCV 82.5 80.6 83.2  PLT 221 197 214   Iron/TIBC/Ferritin/ %Sat No results found for: IRON, TIBC, FERRITIN, IRONPCTSAT

## 2015-10-28 NOTE — Progress Notes (Signed)
Patient arrived to unit by bed.  Reviewed treatment plan and this RN agrees with plan.  Report received from bedside RN, Mathis FareAlbert.  Consent verified.  Patient A & o X 4.   Lung sounds clear to ausculation in all fields. Generalized BLE edema. Cardiac:  NSR.  Removed caps and cleansed LIJ catheter with chlorhedxidine.  Aspirated ports of heparin and flushed them with saline per protocol.  Connected and secured lines, initiated treatment at 1555.  UF Goal of 500mL and net fluid removal 0L.  Will continue to monitor.

## 2015-10-28 NOTE — Progress Notes (Signed)
Dialysis treatment completed.  500 mL ultrafiltrated.  0 mL net fluid removal.  Patient status unchanged. Lung sounds diminished to ausculation in all fields. Generalized edema. Cardiac: NSR.  Cleansed LIJ catheter with chlorhexidine.  Disconnected lines and flushed ports with saline per protocol.  Ports locked with heparin and capped per protocol.    Report given to bedside, RN Selena BattenKim.

## 2015-10-29 LAB — BASIC METABOLIC PANEL
Anion gap: 10 (ref 5–15)
BUN: 14 mg/dL (ref 6–20)
CHLORIDE: 100 mmol/L — AB (ref 101–111)
CO2: 27 mmol/L (ref 22–32)
CREATININE: 5.78 mg/dL — AB (ref 0.44–1.00)
Calcium: 8 mg/dL — ABNORMAL LOW (ref 8.9–10.3)
GFR calc non Af Amer: 8 mL/min — ABNORMAL LOW (ref >60)
GFR, EST AFRICAN AMERICAN: 9 mL/min — AB (ref >60)
Glucose, Bld: 89 mg/dL (ref 65–99)
Potassium: 3.4 mmol/L — ABNORMAL LOW (ref 3.5–5.1)
Sodium: 137 mmol/L (ref 135–145)

## 2015-10-29 LAB — HEPATITIS C ANTIBODY: HCV Ab: <0.1 s/co ratio (ref 0.0–0.9)

## 2015-10-29 LAB — GLUCOSE, CAPILLARY
GLUCOSE-CAPILLARY: 104 mg/dL — AB (ref 65–99)
GLUCOSE-CAPILLARY: 101 mg/dL — AB (ref 65–99)
GLUCOSE-CAPILLARY: 138 mg/dL — AB (ref 65–99)
GLUCOSE-CAPILLARY: 78 mg/dL (ref 65–99)

## 2015-10-29 MED ORDER — POTASSIUM CHLORIDE CRYS ER 20 MEQ PO TBCR
40.0000 meq | EXTENDED_RELEASE_TABLET | Freq: Once | ORAL | Status: AC
  Administered 2015-10-29: 40 meq via ORAL
  Filled 2015-10-29: qty 2

## 2015-10-29 NOTE — Progress Notes (Signed)
PROGRESS NOTE                                                                                                                                                                                                             Patient Demographics:    [name], is a 45 y.o. female, DOB - [birth year], BJY:[id number]  Admit date - 10/19/2015   Admitting Physician Joseph ArtJessica U Vann, DO  Outpatient Primary MD for the patient is Verlon AuBoyd, Tammy Lamonica, MD  LOS - 10  Outpatient Specialists:None  Chief Complaint  Patient presents with  . Cellulitis       Brief Narrative   45 year old morbidly obese female with poorly controlled diabetes seen in the ED 3 times in the past 2 weeks for cellulitis of the perirectal area without abscess. She was sent home on Keflex but returned back to the ED stating worse. In the ED she was found to be septic with worsened cellulitis with necrotizing soft tissue infection . She was started on empiric IV vancomycin and Zosyn. Patient underwent I&D of the skin, subcutaneous tissue, fascia of presacral soft tissue on 9/15, followed by wound VAC placement. Patient developed progressive acute kidney injury and [facility] need of dialysis.   Subjective:   No overnight events.    Assessment  & Plan :    Principal Problem:   Necrotizing soft tissue infection (HCC) Status post I&D. Cultures growing viridans strep and Prevotella. Antibiotics narrowed to Augmentin (day 12/14) . Continue wound VAC (management per surgery)  Active Problems: Acute kidney injury Suspect ATN due to sepsis vs dehydration. Also received IV vancomycin upon admission and IV contrast on 9/8. Marland Kitchen.  Renal ultrasound without urinary retention or hydronephrosis. Given worsened renal function,started HD on 9/22 through rt IJ. tolerated well.  Renal biopsy if kidney function unimproved. HIV ab , hep B ag , HCV  negative    Diabetes mellitus, uncontrolled (HCC) Uncontrolled with A1c of 11.7. Stable on Lantus 25 units daily at bedtime and sliding scale coverage.    Morbid obesity (HCC)        Code Status : Full code  Family Communication  : no one at bedside  Disposition Plan  : Home once renal fn improved  Barriers For Discharge : Acute renal failure needing HD  Consults  :  Surgery Renal  Procedures  :  Renal ultrasound I&D Rt IJ  DVT Prophylaxis  :  Heparin  Lab Results  Component Value Date   PLT 214 10/28/2015    Antibiotics  :    Anti-infectives    Start     Dose/Rate Route Frequency Ordered Stop   10/25/15 1200  amoxicillin-clavulanate (AUGMENTIN) 500-125 MG per tablet 500 mg     1 tablet Oral Every 24 hours 10/25/15 0939 11/01/15 1759   10/22/15 1300  cefTRIAXone (ROCEPHIN) 1 g in dextrose 5 % 50 mL IVPB  Status:  Discontinued     1 g 100 mL/hr over 30 Minutes Intravenous Every 24 hours 10/22/15 1245 10/25/15 0939   10/21/15 1200  doxycycline (VIBRA-TABS) tablet 100 mg  Status:  Discontinued     100 mg Oral Every 12 hours 10/21/15 0859 10/22/15 1246   10/19/15 2000  vancomycin (VANCOCIN) 1,250 mg in sodium chloride 0.9 % 250 mL IVPB  Status:  Discontinued     1,250 mg 166.7 mL/hr over 90 Minutes Intravenous Every 8 hours 10/19/15 1255 10/20/15 2014   10/19/15 1800  piperacillin-tazobactam (ZOSYN) IVPB 3.375 g  Status:  Discontinued     3.375 g 12.5 mL/hr over 240 Minutes Intravenous Every 8 hours 10/19/15 1255 10/21/15 0734   10/19/15 1030  vancomycin (VANCOCIN) 2,500 mg in sodium chloride 0.9 % 500 mL IVPB     2,500 mg 250 mL/hr over 120 Minutes Intravenous  Once 10/19/15 1007 10/19/15 1304   10/19/15 1015  piperacillin-tazobactam (ZOSYN) IVPB 3.375 g     3.375 g 100 mL/hr over 30 Minutes Intravenous STAT 10/19/15 1007 10/19/15 1128        Objective:   Vitals:   10/28/15 2043 10/29/15 0246 10/29/15 0433 10/29/15 1011  BP: (!) 130/55  (!) 116/52 (!)  138/57  Pulse: 70  72 67  Resp: 16  16 14   Temp: 98.9 F (37.2 C)  98.8 F (37.1 C) 97.8 F (36.6 C)  TempSrc: Oral  Oral Oral  SpO2: 99%  95% 96%  Weight:  (!) 160.6 kg (354 lb 0.9 oz)    Height:        Wt Readings from Last 3 Encounters:  10/29/15 (!) 160.6 kg (354 lb 0.9 oz)  10/17/15 (!) 140.6 kg (310 lb)  10/13/15 (!) 140.6 kg (310 lb)     Intake/Output Summary (Last 24 hours) at 10/29/15 1150 Last data filed at 10/29/15 1012  Gross per 24 hour  Intake              950 ml  Output              500 ml  Net              450 ml     Physical Exam  Gen: not in distress HEENT: moist mucosa, supple neck, rt IJ Chest: clear b/l, no added sounds CVS: N S1&S2, no murmurs,  GI: soft, NT, ND Musculoskeletal: warm, no edema, gluteal wound VAC in place     Data Review:    CBC  Recent Labs Lab 10/23/15 0502 10/24/15 0519 10/28/15 0330  WBC 10.9* 10.5 11.4*  HGB 10.0* 10.1* 8.7*  HCT 28.7* 28.3* 25.3*  PLT 221 197 214  MCV 82.5 80.6 83.2  MCH 28.7 28.8 28.6  MCHC 34.8 35.7 34.4  RDW 13.1 13.2 13.2    Chemistries   Recent Labs Lab 10/25/15 0813 10/26/15 0419 10/27/15 0530 10/28/15 0330 10/29/15 0432  NA 133* 134* 134* 139 137  K 4.2 4.4 4.2 3.8 3.4*  CL 104 104 103 102 100*  CO2 16* 15* 16* 22 27  GLUCOSE 99 104* 119* 75 89  BUN 58* 62* 67* 34* 14  CREATININE 11.36* 12.68* 13.94* 9.29* 5.78*  CALCIUM 8.1* 8.1* 8.1* 8.1* 8.0*   ------------------------------------------------------------------------------------------------------------------ No results for input(s): CHOL, HDL, LDLCALC, TRIG, CHOLHDL, LDLDIRECT in the last 72 hours.  Lab Results  Component Value Date   HGBA1C 11.7 (H) 10/19/2015   ------------------------------------------------------------------------------------------------------------------ No results for input(s): TSH, T4TOTAL, T3FREE, THYROIDAB in the last 72 hours.  Invalid input(s):  FREET3 ------------------------------------------------------------------------------------------------------------------ No results for input(s): VITAMINB12, FOLATE, FERRITIN, TIBC, IRON, RETICCTPCT in the last 72 hours.  Coagulation profile No results for input(s): INR, PROTIME in the last 168 hours.  No results for input(s): DDIMER in the last 72 hours.  Cardiac Enzymes No results for input(s): CKMB, TROPONINI, MYOGLOBIN in the last 168 hours.  Invalid input(s): CK ------------------------------------------------------------------------------------------------------------------    Component Value Date/Time   BNP 69.1 03/16/2015 1145    Inpatient Medications  Scheduled Meds: . amoxicillin-clavulanate  1 tablet Oral Q24H  . buPROPion  150 mg Oral Daily  . heparin subcutaneous  5,000 Units Subcutaneous Q8H  . insulin aspart  0-20 Units Subcutaneous TID WC  . insulin aspart  0-5 Units Subcutaneous QHS  . insulin aspart  12 Units Subcutaneous TID WC  . insulin glargine  25 Units Subcutaneous QHS  . nystatin  5 mL Oral QID  . pantoprazole  40 mg Oral BID  . senna  2 tablet Oral BID  . sertraline  25 mg Oral Daily  . sodium chloride flush  3 mL Intravenous Q12H   Continuous Infusions:   PRN Meds:.albuterol, diphenhydrAMINE, HYDROmorphone (DILAUDID) injection, lidocaine, ondansetron **OR** ondansetron (ZOFRAN) IV, oxyCODONE, phenol, promethazine, senna-docusate  Micro Results Recent Results (from the past 240 hour(s))  Aerobic/Anaerobic Culture (surgical/deep wound)     Status: None   Collection Time: 10/19/15  1:49 PM  Result Value Ref Range Status   Specimen Description ABSCESS PRESACRAL  Final   Special Requests NONE  Final   Gram Stain   Final    ABUNDANT WBC PRESENT,BOTH PMN AND MONONUCLEAR ABUNDANT GRAM NEGATIVE RODS ABUNDANT GRAM POSITIVE COCCI IN PAIRS AND CHAINS FEW GRAM POSITIVE RODS    Culture   Final    ABUNDANT VIRIDANS STREPTOCOCCUS RARE STAPHYLOCOCCUS  SPECIES (COAGULASE NEGATIVE) ABUNDANT PREVOTELLA BIVIA BETA LACTAMASE POSITIVE Performed at [facility]    Report Status 10/23/2015 FINAL  Final   Organism ID, Bacteria VIRIDANS STREPTOCOCCUS  Final      Susceptibility   Viridans streptococcus - MIC*    PENICILLIN <=0.06 SENSITIVE Sensitive     CEFTRIAXONE <=0.12 SENSITIVE Sensitive     ERYTHROMYCIN <=0.12 SENSITIVE Sensitive     LEVOFLOXACIN 1 SENSITIVE Sensitive     VANCOMYCIN 0.5 SENSITIVE Sensitive     * ABUNDANT VIRIDANS STREPTOCOCCUS  MRSA PCR Screening     Status: Abnormal   Collection Time: 10/19/15  4:12 PM  Result Value Ref Range Status   MRSA by PCR POSITIVE (A) NEGATIVE Final    Comment:        The GeneXpert MRSA Assay (FDA approved for NASAL specimens only), is one component of a comprehensive MRSA colonization surveillance program. It is not intended to diagnose MRSA infection nor to guide or monitor treatment for MRSA infections. RESULT CALLED TO, READ BACK BY AND VERIFIED WITH: DUNKELBERGER,K AT 1821 ON [id number] BY MOSLEY,J  Urine culture     Status: None   Collection Time: 10/19/15  5:05 PM  Result Value Ref Range Status   Specimen Description URINE, RANDOM  Final   Special Requests NONE  Final   Culture NO GROWTH Performed at [facility]   Final   Report Status 10/20/2015 FINAL  Final    Radiology Reports Ct Abdomen Pelvis W Contrast  Result Date: 10/13/2015 CLINICAL DATA:  Possible perirectal abscess. Further evaluation requested. Initial encounter. EXAM: CT ABDOMEN AND PELVIS WITH CONTRAST TECHNIQUE: Multidetector CT imaging of the abdomen and pelvis was performed using the standard protocol following bolus administration of intravenous contrast. CONTRAST:  ISOVUE-300 IOPAMIDOL (ISOVUE-300) INJECTION 61% COMPARISON:  CT of the abdomen and pelvis from 06/21/2014 FINDINGS: Lower chest: The visualized lung bases are grossly clear. The visualized portions of the mediastinum are  unremarkable. Hepatobiliary: The liver is unremarkable in appearance. Stones are noted dependently within the gallbladder. The gallbladder is otherwise unremarkable. The common bile duct remains normal in caliber. Pancreas: The pancreas is within normal limits. Spleen: The spleen is unremarkable in appearance. Adrenals/Urinary Tract: The adrenal glands are unremarkable in appearance. The kidneys are within normal limits. There is no evidence of hydronephrosis. No renal or ureteral stones are identified. No perinephric stranding is seen. Stomach/Bowel: The stomach is unremarkable in appearance. The small bowel is within normal limits. The appendix is not visualized; there is no evidence for appendicitis. The colon is unremarkable in appearance. Vascular/Lymphatic: The abdominal aorta is unremarkable in appearance. The inferior vena cava is grossly unremarkable. No retroperitoneal lymphadenopathy is seen. No pelvic sidewall lymphadenopathy is identified. Reproductive: The bladder is decompressed and not well assessed. The uterus contains a large 8.6 cm fibroid, with minimal calcification. No suspicious adnexal masses are seen. The ovaries are grossly symmetric. There is mild apparent soft tissue prominence at the cervix and vagina, of uncertain significance. If the patient's symptoms persist, pelvic ultrasound would be helpful for further evaluation. The anorectal canal is unremarkable in appearance. There is no evidence of perirectal abscess. Other: Minimal nonspecific skin thickening is noted at the anterior left mid abdomen. Musculoskeletal: No acute osseous abnormalities are identified. The visualized musculature is unremarkable in appearance. IMPRESSION: 1. No acute abnormality seen to explain the patient's symptoms. No evidence of perirectal abscess. The anorectal canal is unremarkable in appearance. 2. Mild apparent soft tissue prominence at the cervix and vagina, of uncertain significance. If the patient's  symptoms persist, pelvic ultrasound would be helpful for further evaluation. 3. Cholelithiasis.  Gallbladder otherwise unremarkable. 4. Large 8.6 cm uterine fibroid noted. Electronically Signed   By: Roanna Raider M.D.   On: 10/13/2015 23:05   US Renal  Result Date: 10/21/2015 CLINICAL DATA:  Acute renal failure. EXAM: RENAL / URINARY TRACT ULTRASOUND COMPLETE COMPARISON:  None. FINDINGS: Right Kidney: Length: 16.6 cm. Echogenicity within normal limits. No mass or hydronephrosis visualized. Left Kidney: Length: 15.9 cm. Echogenicity within normal limits. No mass or hydronephrosis visualized. Bladder: Appears normal for degree of bladder distention. IMPRESSION: 1. Relatively large kidneys which may be normal variant for this patient. 2. No other abnormality.  No hydronephrosis. Electronically Signed   By: Amie Portland M.D.   On: 10/21/2015 12:30   Ir Fluoro Guide Cv Line Right  Result Date: 10/27/2015 INDICATION: 45 year old female with a history of acute renal failure. EXAM: TUNNELED CENTRAL VENOUS HEMODIALYSIS CATHETER PLACEMENT WITH ULTRASOUND AND FLUOROSCOPIC GUIDANCE MEDICATIONS: None ANESTHESIA/SEDATION: None FLUOROSCOPY TIME:  Fluoroscopy Time: 0 minutes 6 seconds (19.4  mGy). COMPLICATIONS: None PROCEDURE: Informed written consent was obtained from the patient and the patient's family after a thorough discussion of the procedural risks, benefits and alternatives. All questions were addressed. A timeout was performed prior to the initiation of the procedure. The right neck and chest was prepped with chlorhexidine, and draped in the usual sterile fashion using maximum barrier technique (cap and mask, sterile gown, sterile gloves, large sterile sheet, hand hygiene and cutaneous antiseptic). Local anesthesia was attained by infiltration with 1% lidocaine without epinephrine. Ultrasound demonstrated patency of the right internal jugular vein, and this was documented with an image. Under real-time  ultrasound guidance, this vein was accessed with a 21 gauge micropuncture needle and image documentation was performed. A small dermatotomy was made at the access site with an 11 scalpel. A 0.018" wire was advanced into the SVC and the access needle exchanged for a 59F micropuncture vascular sheath. The 0.018" wire was then removed and a 0.035" wire advanced into the IVC. Upon withdrawal of the 018 wire, the wire was marked for appropriate length of the internal portion of the catheter. A 20 cm catheter was selected. Skin and subcutaneous tissues were serially dilated. Catheter was placed on the wire. The catheter tip is positioned in the upper right atrium. This was documented with a spot image. Both ports of the hemodialysis catheter were then tested for excellent function. The ports were then locked with heparinized lock. Patient tolerated the procedure well and remained hemodynamically stable throughout. No complications were encountered and no significant blood loss was encountered. IMPRESSION: Status post right IJ temporary HD catheter placement. Catheter ready for use. Signed, Yvone Neu. Loreta Ave, DO [facility] Radiology Electronically Signed   By: Gilmer Mor D.O.   On: 10/27/2015 13:38   Ir US Guide Vasc Access Right  Result Date: 10/27/2015 INDICATION: 45 year old female with a history of acute renal failure. EXAM: TUNNELED CENTRAL VENOUS HEMODIALYSIS CATHETER PLACEMENT WITH ULTRASOUND AND FLUOROSCOPIC GUIDANCE MEDICATIONS: None ANESTHESIA/SEDATION: None FLUOROSCOPY TIME:  Fluoroscopy Time: 0 minutes 6 seconds (19.4 mGy). COMPLICATIONS: None PROCEDURE: Informed written consent was obtained from the patient and the patient's family after a thorough discussion of the procedural risks, benefits and alternatives. All questions were addressed. A timeout was performed prior to the initiation of the procedure. The right neck and chest was prepped with  chlorhexidine, and draped in the usual sterile fashion using maximum barrier technique (cap and mask, sterile gown, sterile gloves, large sterile sheet, hand hygiene and cutaneous antiseptic). Local anesthesia was attained by infiltration with 1% lidocaine without epinephrine. Ultrasound demonstrated patency of the right internal jugular vein, and this was documented with an image. Under real-time ultrasound guidance, this vein was accessed with a 21 gauge micropuncture needle and image documentation was performed. A small dermatotomy was made at the access site with an 11 scalpel. A 0.018" wire was advanced into the SVC and the access needle exchanged for a 59F micropuncture vascular sheath. The 0.018" wire was then removed and a 0.035" wire advanced into the IVC. Upon withdrawal of the 018 wire, the wire was marked for appropriate length of the internal portion of the catheter. A 20 cm catheter was selected. Skin and subcutaneous tissues were serially dilated. Catheter was placed on the wire. The catheter tip is positioned in the upper right atrium. This was documented with a spot image. Both ports of the hemodialysis catheter were then tested for excellent function. The ports were then locked with heparinized lock.  Patient tolerated the procedure well and remained hemodynamically stable throughout. No complications were encountered and no significant blood loss was encountered. IMPRESSION: Status post right IJ temporary HD catheter placement. Catheter ready for use. Signed, Yvone Neu. Loreta Ave, DO [facility] Radiology Electronically Signed   By: Gilmer Mor D.O.   On: 10/27/2015 13:38    Time Spent in minutes  25   Eddie North M.D on 10/29/2015 at 11:50 AM  Between 7am to 7pm - Pager - [phone]  After 7pm go to [email or web address] - password [facility]  [facility] -  Office  [phone]

## 2015-10-29 NOTE — Progress Notes (Signed)
 [facility] Progress Note   Subjective: HD #2 yest, some BP's are soft.  Standing wt just now is 148.8 kg, bed wts are off. No N/V , no SOB or cough.   Vitals:   10/28/15 2043 10/29/15 0246 10/29/15 0433 10/29/15 1011  BP: (!) 130/55  (!) 116/52 (!) 138/57  Pulse: 70  72 67  Resp: 16  16 14   Temp: 98.9 F (37.2 C)  98.8 F (37.1 C) 97.8 F (36.6 C)  TempSrc: Oral  Oral Oral  SpO2: 99%  95% 96%  Weight:  (!) 160.6 kg (354 lb 0.9 oz)    Height:        Inpatient medications: . amoxicillin-clavulanate  1 tablet Oral Q24H  . buPROPion  150 mg Oral Daily  . heparin subcutaneous  5,000 Units Subcutaneous Q8H  . insulin aspart  0-20 Units Subcutaneous TID WC  . insulin aspart  0-5 Units Subcutaneous QHS  . insulin aspart  12 Units Subcutaneous TID WC  . insulin glargine  25 Units Subcutaneous QHS  . nystatin  5 mL Oral QID  . pantoprazole  40 mg Oral BID  . senna  2 tablet Oral BID  . sertraline  25 mg Oral Daily  . sodium chloride flush  3 mL Intravenous Q12H     albuterol, diphenhydrAMINE, HYDROmorphone (DILAUDID) injection, lidocaine, ondansetron **OR** ondansetron (ZOFRAN) IV, oxyCODONE, phenol, promethazine, senna-docusate  Exam: Alert, no distress, lying prone No jvd Chest clear bilat RRR no mrg Abd obese no CVAT Sacral open wound w VAC system in place No LE edema, no UE edema Neuro NF, ox3 no asterixis   Renal US - 16/ 16 cm kidneys, no hydro UA 9/14 > few bact, 30 prot, 0-5 wbc, 6-30 epis, no RBC, 1.029 Urine sediment 9/24 (undersigned) > clear dilute urine, hardly any sediment from spin: 25 epis/ 0-1 WBC per HPF, no RBC's, no casts, benign looking sediment     Assessment: 1. Acute renal failure - nonliguric, prob ATN  (sepsis/ borderline hypotension/ large NSAID's at home/ large Vanc load on admission). Baseline creat 0.7 on admission. Creat peaked at 13.9, started on HD 9/22.  2nd HD 9/23. Looked at urine today, urine is very clear w minimal  sediment if any, no RBC's/ no casts, few wbcs. Doubt GN or AIN. Await recovery, HD prn w temp cath R IJ. 2. Sacral abscess - sp I&D/wound vac, on augmentin.  3. DM type 2 4. Hypotension - relative low BP today, no vol excess on exam. May be dry, keep even w HD    Plan - SP HD x 2, hold HD for now and watch renal function.    Vinson Moselleob Jadon Ressler MD [facility] pager [phone]    cell [phone] 10/29/2015, 1:26 PM    Recent Labs Lab 10/27/15 0530 10/28/15 0330 10/29/15 0432  NA 134* 139 137  K 4.2 3.8 3.4*  CL 103 102 100*  CO2 16* 22 27  GLUCOSE 119* 75 89  BUN 67* 34* 14  CREATININE 13.94* 9.29* 5.78*  CALCIUM 8.1* 8.1* 8.0*  PHOS 9.7*  --   --    No results for input(s): AST, ALT, ALKPHOS, BILITOT, PROT, ALBUMIN in the last 168 hours.  Recent Labs Lab 10/23/15 0502 10/24/15 0519 10/28/15 0330  WBC 10.9* 10.5 11.4*  HGB 10.0* 10.1* 8.7*  HCT 28.7* 28.3* 25.3*  MCV 82.5 80.6 83.2  PLT 221 197 214   Iron/TIBC/Ferritin/ %Sat No results found for: IRON, TIBC, FERRITIN, IRONPCTSAT

## 2015-10-30 LAB — BASIC METABOLIC PANEL
Anion gap: 10 (ref 5–15)
BUN: 15 mg/dL (ref 6–20)
CHLORIDE: 105 mmol/L (ref 101–111)
CO2: 24 mmol/L (ref 22–32)
CREATININE: 7 mg/dL — AB (ref 0.44–1.00)
Calcium: 8.3 mg/dL — ABNORMAL LOW (ref 8.9–10.3)
GFR, EST AFRICAN AMERICAN: 7 mL/min — AB (ref >60)
GFR, EST NON AFRICAN AMERICAN: 6 mL/min — AB (ref >60)
GLUCOSE: 103 mg/dL — AB (ref 65–99)
Potassium: 3.6 mmol/L (ref 3.5–5.1)
Sodium: 139 mmol/L (ref 135–145)

## 2015-10-30 LAB — GLUCOSE, CAPILLARY
GLUCOSE-CAPILLARY: 98 mg/dL (ref 65–99)
Glucose-Capillary: 104 mg/dL — ABNORMAL HIGH (ref 65–99)
Glucose-Capillary: 57 mg/dL — ABNORMAL LOW (ref 65–99)
Glucose-Capillary: 104 mg/dL — ABNORMAL HIGH (ref 65–99)
Glucose-Capillary: 184 mg/dL — ABNORMAL HIGH (ref 65–99)

## 2015-10-30 MED ORDER — CALCIUM ACETATE (PHOS BINDER) 667 MG PO CAPS
667.0000 mg | ORAL_CAPSULE | Freq: Three times a day (TID) | ORAL | Status: DC
  Administered 2015-10-30 – 2015-11-03 (×12): 667 mg via ORAL
  Filled 2015-10-30 (×13): qty 1

## 2015-10-30 NOTE — Progress Notes (Signed)
Surgery called and made aware that wound vac dressing change done at 0530 this am.  Wound vac was alarming.  No complications.  Will use adhesive dressing with future dressing changes to decrease painfulness of removing adhesive drape.  Will continue to monitor patient.  [employer] [employer] MinorKimberly Lynlee Stratton RN-BC, WTA.

## 2015-10-30 NOTE — Progress Notes (Signed)
[facility] Progress Note  9 Days Post-Op  Subjective: Patient sleeping comfortably, daughter in room.  She states pain is well-controlled, says only needs pain medication at night and at time of wound changes.  Sleeping well.  Walking within room only due to infection precaution status.  Appetite "not great," but states eating well anyway.  Having bowel movements multiple times daily, states discontinued stool softener secondary to diarrhea.  Objective: Vital signs in last 24 hours: Temp:  [97.8 F (36.6 C)-99.6 F (37.6 C)] 99.6 F (37.6 C) (09/24 2015) Pulse Rate:  [67-73] 73 (09/24 2015) Resp:  [14-16] 16 (09/24 2015) BP: (114-138)/(56-61) 114/56 (09/24 2015) SpO2:  [95 %-97 %] 97 % (09/24 2015) Weight:  [328 lb 0.7 oz (148.8 kg)] 328 lb 0.7 oz (148.8 kg) (09/25 0119) Last BM Date: 10/29/15  Intake/Output from previous day: 09/24 0701 - 09/25 0700 In: 1920 [P.O.:1920] Out: 2370 [Urine:1900; Drains:470] Intake/Output this shift: No intake/output data recorded.  PE: Gen:  Alert, NAD, pleasant Abd: Soft, NT/ND, +BS, no HSM Wound: vac in place, changed at 5:30am, wound check wednesday  Lab Results:   Recent Labs  10/28/15 0330  WBC 11.4*  HGB 8.7*  HCT 25.3*  PLT 214   BMET  Recent Labs  10/29/15 0432 10/30/15 0612  NA 137 139  K 3.4* 3.6  CL 100* 105  CO2 27 24  GLUCOSE 89 103*  BUN 14 15  CREATININE 5.78* 7.00*  CALCIUM 8.0* 8.3*   PT/INR No results for input(s): LABPROT, INR in the last 72 hours. CMP     Component Value Date/Time   NA 139 10/30/2015 0612   NA 138 05/05/2013 1649   K 3.6 10/30/2015 0612   CL 105 10/30/2015 0612   CO2 24 10/30/2015 0612   GLUCOSE 103 (H) 10/30/2015 0612   BUN 15 10/30/2015 0612   BUN 11 05/05/2013 1649   CREATININE 7.00 (H) 10/30/2015 0612   CALCIUM 8.3 (L) 10/30/2015 0612   PROT 7.5 10/13/2015 1554   PROT 7.7 05/05/2013 1649   ALBUMIN 3.1 (L) 10/13/2015 1554   ALBUMIN 3.7 05/05/2013 1649   AST  33 10/13/2015 1554   ALT 25 10/13/2015 1554   ALKPHOS 123 10/13/2015 1554   BILITOT 0.7 10/13/2015 1554   GFRNONAA 6 (L) 10/30/2015 0612   GFRAA 7 (L) 10/30/2015 0612   Lipase     Component Value Date/Time   LIPASE 18 (L) 09/02/2014 1743       Studies/Results: No results found.  Anti-infectives: Anti-infectives    Start     Dose/Rate Route Frequency Ordered Stop   10/25/15 1200  amoxicillin-clavulanate (AUGMENTIN) 500-125 MG per tablet 500 mg     1 tablet Oral Every 24 hours 10/25/15 0939 11/01/15 1759   10/22/15 1300  cefTRIAXone (ROCEPHIN) 1 g in dextrose 5 % 50 mL IVPB  Status:  Discontinued     1 g 100 mL/hr over 30 Minutes Intravenous Every 24 hours 10/22/15 1245 10/25/15 0939   10/21/15 1200  doxycycline (VIBRA-TABS) tablet 100 mg  Status:  Discontinued     100 mg Oral Every 12 hours 10/21/15 0859 10/22/15 1246   10/19/15 2000  vancomycin (VANCOCIN) 1,250 mg in sodium chloride 0.9 % 250 mL IVPB  Status:  Discontinued     1,250 mg 166.7 mL/hr over 90 Minutes Intravenous Every 8 hours 10/19/15 1255 10/20/15 2014   10/19/15 1800  piperacillin-tazobactam (ZOSYN) IVPB 3.375 g  Status:  Discontinued     3.375 g  12.5 mL/hr over 240 Minutes Intravenous Every 8 hours 10/19/15 1255 10/21/15 0734   10/19/15 1030  vancomycin (VANCOCIN) 2,500 mg in sodium chloride 0.9 % 500 mL IVPB     2,500 mg 250 mL/hr over 120 Minutes Intravenous  Once 10/19/15 1007 10/19/15 1304   10/19/15 1015  piperacillin-tazobactam (ZOSYN) IVPB 3.375 g     3.375 g 100 mL/hr over 30 Minutes Intravenous STAT 10/19/15 1007 10/19/15 1128       Assessment/Plan Presacral skin and soft tissue infection S/P Irrigation and debridement in ORDr. Claud Kelp 10/19/15 - vac changes scheduled for MWF - we will check with VAC change wednesday - Mobilize as tolerated  ARF - per primary and Renal Service  FEN: carb-modified diet,  ID: augmentin 9/20 >> DVT prophylaxis: heparin, continue SCDs   LOS: 11  days    Octavia Heir , [facility] 10/30/2015, 8:58 AM Pager: [phone] Consults: [phone] Mon-Fri 7:00 am-4:30 pm Sat-Sun 7:00 am-11:30 am

## 2015-10-30 NOTE — Progress Notes (Signed)
PROGRESS NOTE                                                                                                                                                                                                             Patient Demographics:    [name], is a 45 y.o. female, DOB - July 07, 1970, WGN:[id number]  Admit date - 10/19/2015   Admitting Physician Joseph ArtJessica U Vann, DO  Outpatient Primary MD for the patient is Verlon AuBoyd, Tammy Lamonica, MD  LOS - 11  Outpatient Specialists:None  Chief Complaint  Patient presents with  . Cellulitis       Brief Narrative   [age] year old morbidly obese female with poorly controlled diabetes seen in the ED 3 times in the past 2 weeks for cellulitis of the perirectal area without abscess. She was sent home on Keflex but returned back to the ED stating worse. In the ED she was found to be septic with worsened cellulitis with necrotizing soft tissue infection . She was started on empiric IV vancomycin and Zosyn. Patient underwent I&D of the skin, subcutaneous tissue, fascia of presacral soft tissue on 9/15, followed by wound VAC placement. Patient developed progressive acute kidney injury and [facility].   Subjective:   No overnight Issues    Assessment  & Plan :    Principal Problem:   Necrotizing soft tissue infection (HCC) Status post I&D. Cultures growing viridans strep and Prevotella. Antibiotics narrowed to Augmentin (day 13/14) . Wound VAC per surgery.  Active Problems: Acute kidney injury Suspect ATN due to sepsis vs dehydration. Had received IV vancomycin upon admission and IV contrast on 9/8. Marland Kitchen.  Renal ultrasound without urinary retention or hydronephrosis. Given worsened renal function,started HD on 9/22 through rt IJ. tolerated well.  Plan for hemodialysis as needed. Renal biopsy if kidney function unimproved. HIV ab , hep B ag , HCV  negative    Diabetes mellitus, uncontrolled (HCC) Uncontrolled with A1c of 11.7. Stable on Lantus 25 units daily at bedtime and sliding scale coverage.    Morbid obesity (HCC) Counseled on diet and exercise.       Code Status : Full code  Family Communication  : Daughter at bedside  Disposition Plan  : Home once renal fn improved  Barriers For Discharge : Acute renal failure needing HD  Consults  :   Surgery Renal  Procedures  :  Renal ultrasound I&D Rt IJ  DVT Prophylaxis  :  Heparin  Lab Results  Component Value Date   PLT 214 10/28/2015    Antibiotics  :    Anti-infectives    Start     Dose/Rate Route Frequency Ordered Stop   10/25/15 1200  amoxicillin-clavulanate (AUGMENTIN) 500-125 MG per tablet 500 mg     1 tablet Oral Every 24 hours 10/25/15 0939 11/01/15 1759   10/22/15 1300  cefTRIAXone (ROCEPHIN) 1 g in dextrose 5 % 50 mL IVPB  Status:  Discontinued     1 g 100 mL/hr over 30 Minutes Intravenous Every 24 hours 10/22/15 1245 10/25/15 0939   10/21/15 1200  doxycycline (VIBRA-TABS) tablet 100 mg  Status:  Discontinued     100 mg Oral Every 12 hours 10/21/15 0859 10/22/15 1246   10/19/15 2000  vancomycin (VANCOCIN) 1,250 mg in sodium chloride 0.9 % 250 mL IVPB  Status:  Discontinued     1,250 mg 166.7 mL/hr over 90 Minutes Intravenous Every 8 hours 10/19/15 1255 10/20/15 2014   10/19/15 1800  piperacillin-tazobactam (ZOSYN) IVPB 3.375 g  Status:  Discontinued     3.375 g 12.5 mL/hr over 240 Minutes Intravenous Every 8 hours 10/19/15 1255 10/21/15 0734   10/19/15 1030  vancomycin (VANCOCIN) 2,500 mg in sodium chloride 0.9 % 500 mL IVPB     2,500 mg 250 mL/hr over 120 Minutes Intravenous  Once 10/19/15 1007 10/19/15 1304   10/19/15 1015  piperacillin-tazobactam (ZOSYN) IVPB 3.375 g     3.375 g 100 mL/hr over 30 Minutes Intravenous STAT 10/19/15 1007 10/19/15 1128        Objective:   Vitals:   10/29/15 1644 10/29/15 2015 10/30/15 0119 10/30/15 1000    BP: 133/61 (!) 114/56  (!) 101/54  Pulse: 67 73  69  Resp: 16 16  18   Temp: 98.9 F (37.2 C) 99.6 F (37.6 C)  98.6 F (37 C)  TempSrc: Oral Oral  Oral  SpO2: 95% 97%  99%  Weight:   (!) 148.8 kg (328 lb 0.7 oz)   Height:        Wt Readings from Last 3 Encounters:  10/30/15 (!) 148.8 kg (328 lb 0.7 oz)  10/17/15 (!) 140.6 kg (310 lb)  10/13/15 (!) 140.6 kg (310 lb)     Intake/Output Summary (Last 24 hours) at 10/30/15 1353 Last data filed at 10/30/15 1000  Gross per 24 hour  Intake             2160 ml  Output             1870 ml  Net              290 ml     Physical Exam  Gen: not in distress HEENT: moist mucosa,  rt IJ Chest: clear b/l, no added sounds CVS: N S1&S2, no murmurs,  GI: soft, NT, ND Musculoskeletal: warm, no edema, gluteal wound VAC in place     Data Review:    CBC  Recent Labs Lab 10/24/15 0519 10/28/15 0330  WBC 10.5 11.4*  HGB 10.1* 8.7*  HCT 28.3* 25.3*  PLT 197 214  MCV 80.6 83.2  MCH 28.8 28.6  MCHC 35.7 34.4  RDW 13.2 13.2    Chemistries   Recent Labs Lab 10/26/15 0419 10/27/15 0530 10/28/15 0330 10/29/15 0432 10/30/15 0612  NA 134* 134* 139 137 139  K  4.4 4.2 3.8 3.4* 3.6  CL 104 103 102 100* 105  CO2 15* 16* 22 27 24   GLUCOSE 104* 119* 75 89 103*  BUN 62* 67* 34* 14 15  CREATININE 12.68* 13.94* 9.29* 5.78* 7.00*  CALCIUM 8.1* 8.1* 8.1* 8.0* 8.3*   ------------------------------------------------------------------------------------------------------------------ No results for input(s): CHOL, HDL, LDLCALC, TRIG, CHOLHDL, LDLDIRECT in the last 72 hours.  Lab Results  Component Value Date   HGBA1C 11.7 (H) 10/19/2015   ------------------------------------------------------------------------------------------------------------------ No results for input(s): TSH, T4TOTAL, T3FREE, THYROIDAB in the last 72 hours.  Invalid input(s):  FREET3 ------------------------------------------------------------------------------------------------------------------ No results for input(s): VITAMINB12, FOLATE, FERRITIN, TIBC, IRON, RETICCTPCT in the last 72 hours.  Coagulation profile No results for input(s): INR, PROTIME in the last 168 hours.  No results for input(s): DDIMER in the last 72 hours.  Cardiac Enzymes No results for input(s): CKMB, TROPONINI, MYOGLOBIN in the last 168 hours.  Invalid input(s): CK ------------------------------------------------------------------------------------------------------------------    Component Value Date/Time   BNP 69.1 03/16/2015 1145    Inpatient Medications  Scheduled Meds: . amoxicillin-clavulanate  1 tablet Oral Q24H  . buPROPion  150 mg Oral Daily  . calcium acetate  667 mg Oral TID WC  . heparin subcutaneous  5,000 Units Subcutaneous Q8H  . insulin aspart  0-20 Units Subcutaneous TID WC  . insulin aspart  0-5 Units Subcutaneous QHS  . insulin aspart  12 Units Subcutaneous TID WC  . insulin glargine  25 Units Subcutaneous QHS  . nystatin  5 mL Oral QID  . senna  2 tablet Oral BID  . sertraline  25 mg Oral Daily  . sodium chloride flush  3 mL Intravenous Q12H   Continuous Infusions:   PRN Meds:.albuterol, diphenhydrAMINE, HYDROmorphone (DILAUDID) injection, lidocaine, ondansetron **OR** ondansetron (ZOFRAN) IV, oxyCODONE, phenol, promethazine, senna-docusate  Micro Results No results found for this or any previous visit (from the past 240 hour(s)).  Radiology Reports Ct Abdomen Pelvis W Contrast  Result Date: 10/13/2015 CLINICAL DATA:  Possible perirectal abscess. Further evaluation requested. Initial encounter. EXAM: CT ABDOMEN AND PELVIS WITH CONTRAST TECHNIQUE: Multidetector CT imaging of the abdomen and pelvis was performed using the standard protocol following bolus administration of intravenous contrast. CONTRAST:  ISOVUE-300 IOPAMIDOL (ISOVUE-300)  INJECTION 61% COMPARISON:  CT of the abdomen and pelvis from 06/21/2014 FINDINGS: Lower chest: The visualized lung bases are grossly clear. The visualized portions of the mediastinum are unremarkable. Hepatobiliary: The liver is unremarkable in appearance. Stones are noted dependently within the gallbladder. The gallbladder is otherwise unremarkable. The common bile duct remains normal in caliber. Pancreas: The pancreas is within normal limits. Spleen: The spleen is unremarkable in appearance. Adrenals/Urinary Tract: The adrenal glands are unremarkable in appearance. The kidneys are within normal limits. There is no evidence of hydronephrosis. No renal or ureteral stones are identified. No perinephric stranding is seen. Stomach/Bowel: The stomach is unremarkable in appearance. The small bowel is within normal limits. The appendix is not visualized; there is no evidence for appendicitis. The colon is unremarkable in appearance. Vascular/Lymphatic: The abdominal aorta is unremarkable in appearance. The inferior vena cava is grossly unremarkable. No retroperitoneal lymphadenopathy is seen. No pelvic sidewall lymphadenopathy is identified. Reproductive: The bladder is decompressed and not well assessed. The uterus contains a large 8.6 cm fibroid, with minimal calcification. No suspicious adnexal masses are seen. The ovaries are grossly symmetric. There is mild apparent soft tissue prominence at the cervix and vagina, of uncertain significance. If the patient's symptoms persist, pelvic ultrasound would be helpful  for further evaluation. The anorectal canal is unremarkable in appearance. There is no evidence of perirectal abscess. Other: Minimal nonspecific skin thickening is noted at the anterior left mid abdomen. Musculoskeletal: No acute osseous abnormalities are identified. The visualized musculature is unremarkable in appearance. IMPRESSION: 1. No acute abnormality seen to explain the patient's symptoms. No evidence  of perirectal abscess. The anorectal canal is unremarkable in appearance. 2. Mild apparent soft tissue prominence at the cervix and vagina, of uncertain significance. If the patient's symptoms persist, pelvic ultrasound would be helpful for further evaluation. 3. Cholelithiasis.  Gallbladder otherwise unremarkable. 4. Large 8.6 cm uterine fibroid noted. Electronically Signed   By: Roanna Raider M.D.   On: 10/13/2015 23:05   US Renal  Result Date: 10/21/2015 CLINICAL DATA:  Acute renal failure. EXAM: RENAL / URINARY TRACT ULTRASOUND COMPLETE COMPARISON:  None. FINDINGS: Right Kidney: Length: 16.6 cm. Echogenicity within normal limits. No mass or hydronephrosis visualized. Left Kidney: Length: 15.9 cm. Echogenicity within normal limits. No mass or hydronephrosis visualized. Bladder: Appears normal for degree of bladder distention. IMPRESSION: 1. Relatively large kidneys which may be normal variant for this patient. 2. No other abnormality.  No hydronephrosis. Electronically Signed   By: Amie Portland M.D.   On: 10/21/2015 12:30   Ir Fluoro Guide Cv Line Right  Result Date: 10/27/2015 INDICATION: 45 year old female with a history of acute renal failure. EXAM: TUNNELED CENTRAL VENOUS HEMODIALYSIS CATHETER PLACEMENT WITH ULTRASOUND AND FLUOROSCOPIC GUIDANCE MEDICATIONS: None ANESTHESIA/SEDATION: None FLUOROSCOPY TIME:  Fluoroscopy Time: 0 minutes 6 seconds (19.4 mGy). COMPLICATIONS: None PROCEDURE: Informed written consent was obtained from the patient and the patient's family after a thorough discussion of the procedural risks, benefits and alternatives. All questions were addressed. A timeout was performed prior to the initiation of the procedure. The right neck and chest was prepped with chlorhexidine, and draped in the usual sterile fashion using maximum barrier technique (cap and mask, sterile gown, sterile gloves, large sterile sheet, hand hygiene and cutaneous antiseptic). Local anesthesia was attained by  infiltration with 1% lidocaine without epinephrine. Ultrasound demonstrated patency of the right internal jugular vein, and this was documented with an image. Under real-time ultrasound guidance, this vein was accessed with a 21 gauge micropuncture needle and image documentation was performed. A small dermatotomy was made at the access site with an 11 scalpel. A 0.018" wire was advanced into the SVC and the access needle exchanged for a 74F micropuncture vascular sheath. The 0.018" wire was then removed and a 0.035" wire advanced into the IVC. Upon withdrawal of the 018 wire, the wire was marked for appropriate length of the internal portion of the catheter. A 20 cm catheter was selected. Skin and subcutaneous tissues were serially dilated. Catheter was placed on the wire. The catheter tip is positioned in the upper right atrium. This was documented with a spot image. Both ports of the hemodialysis catheter were then tested for excellent function. The ports were then locked with heparinized lock. Patient tolerated the procedure well and remained hemodynamically stable throughout. No complications were encountered and no significant blood loss was encountered. IMPRESSION: Status post right IJ temporary HD catheter placement. Catheter ready for use. Signed, Yvone Neu. Loreta Ave, DO [facility] Electronically Signed   By: Gilmer Mor D.O.   On: 10/27/2015 13:38   Ir US Guide Vasc Access Right  Result Date: 10/27/2015 INDICATION: 45 year old female with a history of acute renal failure. EXAM: TUNNELED CENTRAL VENOUS HEMODIALYSIS CATHETER PLACEMENT  WITH ULTRASOUND AND FLUOROSCOPIC GUIDANCE MEDICATIONS: None ANESTHESIA/SEDATION: None FLUOROSCOPY TIME:  Fluoroscopy Time: 0 minutes 6 seconds (19.4 mGy). COMPLICATIONS: None PROCEDURE: Informed written consent was obtained from the patient and the patient's family after a thorough discussion of the procedural risks,  benefits and alternatives. All questions were addressed. A timeout was performed prior to the initiation of the procedure. The right neck and chest was prepped with chlorhexidine, and draped in the usual sterile fashion using maximum barrier technique (cap and mask, sterile gown, sterile gloves, large sterile sheet, hand hygiene and cutaneous antiseptic). Local anesthesia was attained by infiltration with 1% lidocaine without epinephrine. Ultrasound demonstrated patency of the right internal jugular vein, and this was documented with an image. Under real-time ultrasound guidance, this vein was accessed with a 21 gauge micropuncture needle and image documentation was performed. A small dermatotomy was made at the access site with an 11 scalpel. A 0.018" wire was advanced into the SVC and the access needle exchanged for a 61F micropuncture vascular sheath. The 0.018" wire was then removed and a 0.035" wire advanced into the IVC. Upon withdrawal of the 018 wire, the wire was marked for appropriate length of the internal portion of the catheter. A 20 cm catheter was selected. Skin and subcutaneous tissues were serially dilated. Catheter was placed on the wire. The catheter tip is positioned in the upper right atrium. This was documented with a spot image. Both ports of the hemodialysis catheter were then tested for excellent function. The ports were then locked with heparinized lock. Patient tolerated the procedure well and remained hemodynamically stable throughout. No complications were encountered and no significant blood loss was encountered. IMPRESSION: Status post right IJ temporary HD catheter placement. Catheter ready for use. Signed, Yvone Neu. Loreta Ave, DO [facility] Electronically Signed   By: Gilmer Mor D.O.   On: 10/27/2015 13:38    Time Spent in minutes  25   Eddie North M.D on 10/30/2015 at 1:53 PM  Between 7am to 7pm - Pager -  [phone]  After 7pm go to [email or web address] - password [facility]  [facility] -  Office  [phone]

## 2015-10-30 NOTE — Progress Notes (Signed)
Subjective: Interval History: has no complaint, no D.  Objective: Vital signs in last 24 hours: Temp:  [98.9 F (37.2 C)-99.6 F (37.6 C)] 99.6 F (37.6 C) (09/24 2015) Pulse Rate:  [67-73] 73 (09/24 2015) Resp:  [16] 16 (09/24 2015) BP: (114-133)/(56-61) 114/56 (09/24 2015) SpO2:  [95 %-97 %] 97 % (09/24 2015) Weight:  [148.8 kg (328 lb 0.7 oz)] 148.8 kg (328 lb 0.7 oz) (09/25 0119) Weight change: -11.8 kg (-26 lb 0.2 oz)  Intake/Output from previous day: 09/24 0701 - 09/25 0700 In: 1920 [P.O.:1920] Out: 2370 [Urine:1900; Drains:470] Intake/Output this shift: No intake/output data recorded.  General appearance: alert, cooperative, morbidly obese and pale Neck: RIJ cath Resp: RIJ cath Cardio: S1, S2 normal GI: obese, pos bs Extremities: extremities normal, atraumatic, no cyanosis or edema  Resp occ exp wheeze  Lab Results:  Recent Labs  10/28/15 0330  WBC 11.4*  HGB 8.7*  HCT 25.3*  PLT 214   BMET:  Recent Labs  10/29/15 0432 10/30/15 0612  NA 137 139  K 3.4* 3.6  CL 100* 105  CO2 27 24  GLUCOSE 89 103*  BUN 14 15  CREATININE 5.78* 7.00*  CALCIUM 8.0* 8.3*   No results for input(s): PTH in the last 72 hours. Iron Studies: No results for input(s): IRON, TIBC, TRANSFERRIN, FERRITIN in the last 72 hours.  Studies/Results: No results found.  I have reviewed the patient's current medications.  Assessment/Plan: 1 AKI nonoliguric ATN, bp, NSAIDs.  No function yet. 2 Obesity 3 DM controlled 4Anemia 5 Sacral abscess debride and AB P follow chem, renal diet , HD as needed    LOS: 11 days   Masao Junker L 10/30/2015,10:36 AM

## 2015-10-31 DIAGNOSIS — N170 Acute kidney failure with tubular necrosis: Secondary | ICD-10-CM

## 2015-10-31 LAB — RENAL FUNCTION PANEL
Albumin: 2.1 g/dL — ABNORMAL LOW (ref 3.5–5.0)
Anion gap: 11 (ref 5–15)
BUN: 18 mg/dL (ref 6–20)
CHLORIDE: 105 mmol/L (ref 101–111)
CO2: 22 mmol/L (ref 22–32)
CREATININE: 7.33 mg/dL — AB (ref 0.44–1.00)
Calcium: 8.7 mg/dL — ABNORMAL LOW (ref 8.9–10.3)
GFR calc Af Amer: 7 mL/min — ABNORMAL LOW (ref >60)
GFR, EST NON AFRICAN AMERICAN: 6 mL/min — AB (ref >60)
GLUCOSE: 178 mg/dL — AB (ref 65–99)
POTASSIUM: 3.9 mmol/L (ref 3.5–5.1)
Phosphorus: 5.5 mg/dL — ABNORMAL HIGH (ref 2.5–4.6)
Sodium: 138 mmol/L (ref 135–145)

## 2015-10-31 LAB — GLUCOSE, CAPILLARY
Glucose-Capillary: 150 mg/dL — ABNORMAL HIGH (ref 65–99)
Glucose-Capillary: 161 mg/dL — ABNORMAL HIGH (ref 65–99)
Glucose-Capillary: 134 mg/dL — ABNORMAL HIGH (ref 65–99)
Glucose-Capillary: 136 mg/dL — ABNORMAL HIGH (ref 65–99)

## 2015-10-31 NOTE — Progress Notes (Signed)
Subjective: Interval History: has no complaint .  Objective: Vital signs in last 24 hours: Temp:  [98.3 F (36.8 C)-99.2 F (37.3 C)] 98.3 F (36.8 C) (09/26 0931) Pulse Rate:  [65-73] 65 (09/26 0931) Resp:  [17-20] 18 (09/26 0931) BP: (132-145)/(49-64) 138/63 (09/26 0931) SpO2:  [97 %-99 %] 98 % (09/26 0931) Weight:  [150.6 kg (332 lb)] 150.6 kg (332 lb) (09/25 2100) Weight change: 1.794 kg (3 lb 15.3 oz)  Intake/Output from previous day: 09/25 0701 - 09/26 0700 In: 740 [P.O.:740] Out: 2725 [Urine:2625; Drains:100] Intake/Output this shift: Total I/O In: 120 [P.O.:120] Out: 450 [Urine:450]  General appearance: alert, cooperative and morbidly obese Neck: IJ HD cath Resp: diminished breath sounds bilaterally Cardio: S1, S2 normal and systolic murmur: holosystolic 2/6, blowing at apex GI: massive, pos bs, soft Extremities: edema 1+, vac on sacrum and vac  Lab Results: No results for input(s): WBC, HGB, HCT, PLT in the last 72 hours. BMET:  Recent Labs  10/30/15 0612 10/31/15 0552  NA 139 138  K 3.6 3.9  CL 105 105  CO2 24 22  GLUCOSE 103* 178*  BUN 15 18  CREATININE 7.00* 7.33*  CALCIUM 8.3* 8.7*   No results for input(s): PTH in the last 72 hours. Iron Studies: No results for input(s): IRON, TIBC, TRANSFERRIN, FERRITIN in the last 72 hours.  Studies/Results: No results found.  I have reviewed the patient's current medications.  Assessment/Plan: 1 AKI nonoliguric, lower rise in Cr, indic some function.  Not enough to get by but not needing HD today.  Mild acidemia, K ok. 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 Plan 4 DM per primary P follow chem, diet, AB, Vac    LOS: 12 days   Shelitha Magley L 10/31/2015,10:41 AM

## 2015-10-31 NOTE — Progress Notes (Signed)
PROGRESS NOTE                                                                                                                                                                                                             Patient Demographics:    [name], is a 45 y.o. female, DOB - 09/20/1970, ZOX:[id number]  Admit date - 10/19/2015   Admitting Physician Joseph Art, DO  Outpatient Primary MD for the patient is Verlon Au, MD  LOS - 12  Outpatient Specialists:None  Chief Complaint  Patient presents with  . Cellulitis       Brief Narrative   45 year old morbidly obese female with poorly controlled diabetes seen in the ED 3 times in the past 2 weeks for cellulitis of the perirectal area without abscess. She was sent home on Keflex but returned back to the ED stating worse. In the ED she was found to be septic with worsened cellulitis with necrotizing soft tissue infection . She was started on empiric IV vancomycin and Zosyn. Patient underwent I&D of the skin, subcutaneous tissue, fascia of presacral soft tissue on 9/15, followed by wound VAC placement. Patient developed progressive acute kidney injury and transferred to [facility] for need of dialysis.   Subjective:   No overnight Issues.Has good urine output.    Assessment  & Plan :    Principal Problem:   Necrotizing soft tissue infection (HCC) Status post I&D. Cultures growing viridans strep and Prevotella. Antibiotics narrowed to Augmentin (Completes 2 weeks course today). Wound VAC per surgery.  Active Problems: Acute kidney injury Suspect ATN due to sepsis vs dehydration. Had received IV vancomycin upon admission and IV contrast on 9/8. Marland Kitchen So taking regular NSAIDs at home.  Renal ultrasound without urinary retention or hydronephrosis. Given worsened renal function,started HD on 9/22 through rt IJ. tolerated well.  Given some improvement in  renal function , holding off on further dialysis. Renal biopsy if kidney function unimproved. HIV ab , hep B ag , HCV negative    Diabetes mellitus, uncontrolled (HCC) Uncontrolled with A1c of 11.7. Stable on Lantus 25 units daily at bedtime and sliding scale coverage.    Morbid obesity (HCC) Counseled on diet and exercise.       Code Status : Full code  Family Communication  : None at bedside. Daughter updated  on 9/25  Disposition Plan  : Home once renal fn improved  Barriers For Discharge : Acute renal failure needing HD  Consults  :   Surgery Renal  Procedures  :  Renal ultrasound I&D Rt IJ  DVT Prophylaxis  :  Heparin  Lab Results  Component Value Date   PLT 214 10/28/2015    Antibiotics  :    Anti-infectives    Start     Dose/Rate Route Frequency Ordered Stop   10/25/15 1200  amoxicillin-clavulanate (AUGMENTIN) 500-125 MG per tablet 500 mg     1 tablet Oral Every 24 hours 10/25/15 0939 11/01/15 1759   10/22/15 1300  cefTRIAXone (ROCEPHIN) 1 g in dextrose 5 % 50 mL IVPB  Status:  Discontinued     1 g 100 mL/hr over 30 Minutes Intravenous Every 24 hours 10/22/15 1245 10/25/15 0939   10/21/15 1200  doxycycline (VIBRA-TABS) tablet 100 mg  Status:  Discontinued     100 mg Oral Every 12 hours 10/21/15 0859 10/22/15 1246   10/19/15 2000  vancomycin (VANCOCIN) 1,250 mg in sodium chloride 0.9 % 250 mL IVPB  Status:  Discontinued     1,250 mg 166.7 mL/hr over 90 Minutes Intravenous Every 8 hours 10/19/15 1255 10/20/15 2014   10/19/15 1800  piperacillin-tazobactam (ZOSYN) IVPB 3.375 g  Status:  Discontinued     3.375 g 12.5 mL/hr over 240 Minutes Intravenous Every 8 hours 10/19/15 1255 10/21/15 0734   10/19/15 1030  vancomycin (VANCOCIN) 2,500 mg in sodium chloride 0.9 % 500 mL IVPB     2,500 mg 250 mL/hr over 120 Minutes Intravenous  Once 10/19/15 1007 10/19/15 1304   10/19/15 1015  piperacillin-tazobactam (ZOSYN) IVPB 3.375 g     3.375 g 100 mL/hr over 30  Minutes Intravenous STAT 10/19/15 1007 10/19/15 1128        Objective:   Vitals:   10/30/15 2050 10/30/15 2100 10/31/15 0614 10/31/15 0931  BP: (!) 145/56  (!) 139/49 138/63  Pulse: 72  73 65  Resp: 17  20 18   Temp: 98.7 F (37.1 C)  99.2 F (37.3 C) 98.3 F (36.8 C)  TempSrc:    Oral  SpO2: 99%  97% 98%  Weight:  (!) 150.6 kg (332 lb)    Height:        Wt Readings from Last 3 Encounters:  10/30/15 (!) 150.6 kg (332 lb)  10/17/15 (!) 140.6 kg (310 lb)  10/13/15 (!) 140.6 kg (310 lb)     Intake/Output Summary (Last 24 hours) at 10/31/15 1330 Last data filed at 10/31/15 1230  Gross per 24 hour  Intake              740 ml  Output             3175 ml  Net            -2435 ml     Physical Exam  Gen: not in distress HEENT: moist mucosa,  rt IJ Chest: clear b/l, no added sounds CVS: N S1&S2, no murmurs,  GI: soft, NT, ND Musculoskeletal: warm, no edema, gluteal wound VAC in place     Data Review:    CBC  Recent Labs Lab 10/28/15 0330  WBC 11.4*  HGB 8.7*  HCT 25.3*  PLT 214  MCV 83.2  MCH 28.6  MCHC 34.4  RDW 13.2    Chemistries   Recent Labs Lab 10/27/15 0530 10/28/15 0330 10/29/15 0432 10/30/15 0612 10/31/15 0552  NA  134* 139 137 139 138  K 4.2 3.8 3.4* 3.6 3.9  CL 103 102 100* 105 105  CO2 16* 22 27 24 22   GLUCOSE 119* 75 89 103* 178*  BUN 67* 34* 14 15 18   CREATININE 13.94* 9.29* 5.78* 7.00* 7.33*  CALCIUM 8.1* 8.1* 8.0* 8.3* 8.7*   ------------------------------------------------------------------------------------------------------------------ No results for input(s): CHOL, HDL, LDLCALC, TRIG, CHOLHDL, LDLDIRECT in the last 72 hours.  Lab Results  Component Value Date   HGBA1C 11.7 (H) 10/19/2015   ------------------------------------------------------------------------------------------------------------------ No results for input(s): TSH, T4TOTAL, T3FREE, THYROIDAB in the last 72 hours.  Invalid input(s):  FREET3 ------------------------------------------------------------------------------------------------------------------ No results for input(s): VITAMINB12, FOLATE, FERRITIN, TIBC, IRON, RETICCTPCT in the last 72 hours.  Coagulation profile No results for input(s): INR, PROTIME in the last 168 hours.  No results for input(s): DDIMER in the last 72 hours.  Cardiac Enzymes No results for input(s): CKMB, TROPONINI, MYOGLOBIN in the last 168 hours.  Invalid input(s): CK ------------------------------------------------------------------------------------------------------------------    Component Value Date/Time   BNP 69.1 03/16/2015 1145    Inpatient Medications  Scheduled Meds: . amoxicillin-clavulanate  1 tablet Oral Q24H  . buPROPion  150 mg Oral Daily  . calcium acetate  667 mg Oral TID WC  . heparin subcutaneous  5,000 Units Subcutaneous Q8H  . insulin aspart  0-20 Units Subcutaneous TID WC  . insulin aspart  0-5 Units Subcutaneous QHS  . insulin aspart  12 Units Subcutaneous TID WC  . insulin glargine  25 Units Subcutaneous QHS  . nystatin  5 mL Oral QID  . senna  2 tablet Oral BID  . sertraline  25 mg Oral Daily  . sodium chloride flush  3 mL Intravenous Q12H   Continuous Infusions:   PRN Meds:.albuterol, diphenhydrAMINE, HYDROmorphone (DILAUDID) injection, lidocaine, ondansetron **OR** ondansetron (ZOFRAN) IV, oxyCODONE, phenol, promethazine, senna-docusate  Micro Results No results found for this or any previous visit (from the past 240 hour(s)).  Radiology Reports Ct Abdomen Pelvis W Contrast  Result Date: 10/13/2015 CLINICAL DATA:  Possible perirectal abscess. Further evaluation requested. Initial encounter. EXAM: CT ABDOMEN AND PELVIS WITH CONTRAST TECHNIQUE: Multidetector CT imaging of the abdomen and pelvis was performed using the standard protocol following bolus administration of intravenous contrast. CONTRAST:  100mL ISOVUE-300 IOPAMIDOL (ISOVUE-300)  INJECTION 61% COMPARISON:  CT of the abdomen and pelvis from 06/21/2014 FINDINGS: Lower chest: The visualized lung bases are grossly clear. The visualized portions of the mediastinum are unremarkable. Hepatobiliary: The liver is unremarkable in appearance. Stones are noted dependently within the gallbladder. The gallbladder is otherwise unremarkable. The common bile duct remains normal in caliber. Pancreas: The pancreas is within normal limits. Spleen: The spleen is unremarkable in appearance. Adrenals/Urinary Tract: The adrenal glands are unremarkable in appearance. The kidneys are within normal limits. There is no evidence of hydronephrosis. No renal or ureteral stones are identified. No perinephric stranding is seen. Stomach/Bowel: The stomach is unremarkable in appearance. The small bowel is within normal limits. The appendix is not visualized; there is no evidence for appendicitis. The colon is unremarkable in appearance. Vascular/Lymphatic: The abdominal aorta is unremarkable in appearance. The inferior vena cava is grossly unremarkable. No retroperitoneal lymphadenopathy is seen. No pelvic sidewall lymphadenopathy is identified. Reproductive: The bladder is decompressed and not well assessed. The uterus contains a large 8.6 cm fibroid, with minimal calcification. No suspicious adnexal masses are seen. The ovaries are grossly symmetric. There is mild apparent soft tissue prominence at the cervix and vagina, of uncertain significance. If the patient's  symptoms persist, pelvic ultrasound would be helpful for further evaluation. The anorectal canal is unremarkable in appearance. There is no evidence of perirectal abscess. Other: Minimal nonspecific skin thickening is noted at the anterior left mid abdomen. Musculoskeletal: No acute osseous abnormalities are identified. The visualized musculature is unremarkable in appearance. IMPRESSION: 1. No acute abnormality seen to explain the patient's symptoms. No evidence  of perirectal abscess. The anorectal canal is unremarkable in appearance. 2. Mild apparent soft tissue prominence at the cervix and vagina, of uncertain significance. If the patient's symptoms persist, pelvic ultrasound would be helpful for further evaluation. 3. Cholelithiasis.  Gallbladder otherwise unremarkable. 4. Large 8.6 cm uterine fibroid noted. Electronically Signed   By: Roanna Raider M.D.   On: 10/13/2015 23:05   US Renal  Result Date: 10/21/2015 CLINICAL DATA:  Acute renal failure. EXAM: RENAL / URINARY TRACT ULTRASOUND COMPLETE COMPARISON:  None. FINDINGS: Right Kidney: Length: 16.6 cm. Echogenicity within normal limits. No mass or hydronephrosis visualized. Left Kidney: Length: 15.9 cm. Echogenicity within normal limits. No mass or hydronephrosis visualized. Bladder: Appears normal for degree of bladder distention. IMPRESSION: 1. Relatively large kidneys which may be normal variant for this patient. 2. No other abnormality.  No hydronephrosis. Electronically Signed   By: Amie Portland M.D.   On: 10/21/2015 12:30   Ir Fluoro Guide Cv Line Right  Result Date: 10/27/2015 INDICATION: 45 year old female with a history of acute renal failure. EXAM: TUNNELED CENTRAL VENOUS HEMODIALYSIS CATHETER PLACEMENT WITH ULTRASOUND AND FLUOROSCOPIC GUIDANCE MEDICATIONS: None ANESTHESIA/SEDATION: None FLUOROSCOPY TIME:  Fluoroscopy Time: 0 minutes 6 seconds (19.4 mGy). COMPLICATIONS: None PROCEDURE: Informed written consent was obtained from the patient and the patient's family after a thorough discussion of the procedural risks, benefits and alternatives. All questions were addressed. A timeout was performed prior to the initiation of the procedure. The right neck and chest was prepped with chlorhexidine, and draped in the usual sterile fashion using maximum barrier technique (cap and mask, sterile gown, sterile gloves, large sterile sheet, hand hygiene and cutaneous antiseptic). Local anesthesia was attained by  infiltration with 1% lidocaine without epinephrine. Ultrasound demonstrated patency of the right internal jugular vein, and this was documented with an image. Under real-time ultrasound guidance, this vein was accessed with a 21 gauge micropuncture needle and image documentation was performed. A small dermatotomy was made at the access site with an 11 scalpel. A 0.018" wire was advanced into the SVC and the access needle exchanged for a 39F micropuncture vascular sheath. The 0.018" wire was then removed and a 0.035" wire advanced into the IVC. Upon withdrawal of the 018 wire, the wire was marked for appropriate length of the internal portion of the catheter. A 20 cm catheter was selected. Skin and subcutaneous tissues were serially dilated. Catheter was placed on the wire. The catheter tip is positioned in the upper right atrium. This was documented with a spot image. Both ports of the hemodialysis catheter were then tested for excellent function. The ports were then locked with heparinized lock. Patient tolerated the procedure well and remained hemodynamically stable throughout. No complications were encountered and no significant blood loss was encountered. IMPRESSION: Status post right IJ temporary HD catheter placement. Catheter ready for use. Signed, Yvone Neu. Loreta Ave, DO [facility] Radiology Electronically Signed   By: Gilmer Mor D.O.   On: 10/27/2015 13:38   Ir US Guide Vasc Access Right  Result Date: 10/27/2015 INDICATION: 45 year old female with a history of acute renal failure.  EXAM: TUNNELED CENTRAL VENOUS HEMODIALYSIS CATHETER PLACEMENT WITH ULTRASOUND AND FLUOROSCOPIC GUIDANCE MEDICATIONS: None ANESTHESIA/SEDATION: None FLUOROSCOPY TIME:  Fluoroscopy Time: 0 minutes 6 seconds (19.4 mGy). COMPLICATIONS: None PROCEDURE: Informed written consent was obtained from the patient and the patient's family after a thorough discussion of the procedural risks,  benefits and alternatives. All questions were addressed. A timeout was performed prior to the initiation of the procedure. The right neck and chest was prepped with chlorhexidine, and draped in the usual sterile fashion using maximum barrier technique (cap and mask, sterile gown, sterile gloves, large sterile sheet, hand hygiene and cutaneous antiseptic). Local anesthesia was attained by infiltration with 1% lidocaine without epinephrine. Ultrasound demonstrated patency of the right internal jugular vein, and this was documented with an image. Under real-time ultrasound guidance, this vein was accessed with a 21 gauge micropuncture needle and image documentation was performed. A small dermatotomy was made at the access site with an 11 scalpel. A 0.018" wire was advanced into the SVC and the access needle exchanged for a 67F micropuncture vascular sheath. The 0.018" wire was then removed and a 0.035" wire advanced into the IVC. Upon withdrawal of the 018 wire, the wire was marked for appropriate length of the internal portion of the catheter. A 20 cm catheter was selected. Skin and subcutaneous tissues were serially dilated. Catheter was placed on the wire. The catheter tip is positioned in the upper right atrium. This was documented with a spot image. Both ports of the hemodialysis catheter were then tested for excellent function. The ports were then locked with heparinized lock. Patient tolerated the procedure well and remained hemodynamically stable throughout. No complications were encountered and no significant blood loss was encountered. IMPRESSION: Status post right IJ temporary HD catheter placement. Catheter ready for use. Signed, Yvone Neu. Loreta Ave, DO [facility] Radiology Electronically Signed   By: Gilmer Mor D.O.   On: 10/27/2015 13:38    Time Spent in minutes  25   Eddie North M.D on 10/31/2015 at 1:30 PM  Between 7am to 7pm - Pager -  [phone]  After 7pm go to [email or web address] - password [facility]  [facility] -  Office  [phone]

## 2015-11-01 LAB — RENAL FUNCTION PANEL
ALBUMIN: 2.1 g/dL — AB (ref 3.5–5.0)
Anion gap: 8 (ref 5–15)
BUN: 17 mg/dL (ref 6–20)
CALCIUM: 8.9 mg/dL (ref 8.9–10.3)
CO2: 27 mmol/L (ref 22–32)
CREATININE: 7.22 mg/dL — AB (ref 0.44–1.00)
Chloride: 103 mmol/L (ref 101–111)
GFR calc Af Amer: 7 mL/min — ABNORMAL LOW (ref >60)
GFR calc non Af Amer: 6 mL/min — ABNORMAL LOW (ref >60)
GLUCOSE: 154 mg/dL — AB (ref 65–99)
PHOSPHORUS: 5.7 mg/dL — AB (ref 2.5–4.6)
Potassium: 3.8 mmol/L (ref 3.5–5.1)
SODIUM: 138 mmol/L (ref 135–145)

## 2015-11-01 LAB — GLUCOSE, CAPILLARY
GLUCOSE-CAPILLARY: 166 mg/dL — AB (ref 65–99)
GLUCOSE-CAPILLARY: 84 mg/dL (ref 65–99)
GLUCOSE-CAPILLARY: 74 mg/dL (ref 65–99)

## 2015-11-01 MED ORDER — SODIUM CHLORIDE 0.9% FLUSH
10.0000 mL | INTRAVENOUS | Status: DC | PRN
  Administered 2015-11-01 – 2015-11-02 (×2): 10 mL
  Filled 2015-11-01 (×2): qty 40

## 2015-11-01 MED ORDER — SODIUM CHLORIDE 0.9% FLUSH
10.0000 mL | Freq: Two times a day (BID) | INTRAVENOUS | Status: DC
  Administered 2015-11-03: 10 mL

## 2015-11-01 MED ORDER — SODIUM CHLORIDE 0.9% FLUSH: 10.0000 mL | INTRAVENOUS | Status: DC | PRN

## 2015-11-01 NOTE — Progress Notes (Signed)
PROGRESS NOTE                                                                                                                                                                                                             Patient Demographics:    [name], is a 45 y.o. female, DOB - July 13, 1970, GEX:[id number]  Admit date - 10/19/2015   Admitting Physician Joseph Art, DO  Outpatient Primary MD for the patient is Verlon Au, MD  LOS - 13  Outpatient Specialists:None  Chief Complaint  Patient presents with  . Cellulitis       Brief Narrative   45 year old morbidly obese female with poorly controlled diabetes seen in the ED 3 times in the past 2 weeks for cellulitis of the perirectal area without abscess. She was sent home on Keflex but returned back to the ED stating worse. In the ED she was found to be septic with worsened cellulitis with necrotizing soft tissue infection . She was started on empiric IV vancomycin and Zosyn. Patient underwent I&D of the skin, subcutaneous tissue, fascia of presacral soft tissue on 9/15, followed by wound VAC placement. Patient developed progressive acute kidney injury and transferred to [facility] for need of dialysis.   Subjective:   Pt has no new complaints.    Assessment  & Plan :    Principal Problem:   Necrotizing soft tissue infection (HCC) Status post I&D. Cultures growing viridans strep and Prevotella. Antibiotics narrowed to Augmentin (Completed 2 wks of antibiotic treatment) Wound VAC per surgery.  Active Problems: Acute kidney injury Suspect ATN due to sepsis vs dehydration. Had received IV vancomycin upon admission and IV contrast on 9/8. Marland Kitchen So taking regular NSAIDs at home.  Renal ultrasound without urinary retention or hydronephrosis. Given worsened renal function,started HD on 9/22 through rt IJ. tolerated well.  Nephrology on board and  assisting. HIV ab , hep B ag , HCV negative    Diabetes mellitus, uncontrolled (HCC) Uncontrolled with A1c of 11.7. Stable on Lantus 25 units daily at bedtime and sliding scale coverage.    Morbid obesity (HCC) Counseled on diet and exercise.   Code  Status : Full code  Family Communication  : None at bedside. Daughter updated on 9/25  Disposition Plan  : Home once renal fn improved  Barriers For Discharge : Acute renal failure needing HD  Consults  :   Surgery Renal  Procedures  :  Renal ultrasound I&D Rt IJ  DVT Prophylaxis  :  Heparin  Lab Results  Component Value Date   PLT 214 10/28/2015    Antibiotics  :    Anti-infectives    Start     Dose/Rate Route Frequency Ordered Stop   10/25/15 1200  amoxicillin-clavulanate (AUGMENTIN) 500-125 MG per tablet 500 mg     1 tablet Oral Every 24 hours 10/25/15 0939 10/31/15 1745   10/22/15 1300  cefTRIAXone (ROCEPHIN) 1 g in dextrose 5 % 50 mL IVPB  Status:  Discontinued     1 g 100 mL/hr over 30 Minutes Intravenous Every 24 hours 10/22/15 1245 10/25/15 0939   10/21/15 1200  doxycycline (VIBRA-TABS) tablet 100 mg  Status:  Discontinued     100 mg Oral Every 12 hours 10/21/15 0859 10/22/15 1246   10/19/15 2000  vancomycin (VANCOCIN) 1,250 mg in sodium chloride 0.9 % 250 mL IVPB  Status:  Discontinued     1,250 mg 166.7 mL/hr over 90 Minutes Intravenous Every 8 hours 10/19/15 1255 10/20/15 2014   10/19/15 1800  piperacillin-tazobactam (ZOSYN) IVPB 3.375 g  Status:  Discontinued     3.375 g 12.5 mL/hr over 240 Minutes Intravenous Every 8 hours 10/19/15 1255 10/21/15 0734   10/19/15 1030  vancomycin (VANCOCIN) 2,500 mg in sodium chloride 0.9 % 500 mL IVPB     2,500 mg 250 mL/hr over 120 Minutes Intravenous  Once 10/19/15 1007 10/19/15 1304   10/19/15 1015  piperacillin-tazobactam (ZOSYN) IVPB 3.375 g     3.375 g 100 mL/hr over 30 Minutes Intravenous STAT 10/19/15 1007 10/19/15 1128        Objective:   Vitals:    10/31/15 1701 10/31/15 2202 11/01/15 0525 11/01/15 1000  BP: (!) 130/55 (!) 143/61 (!) 104/47 121/89  Pulse: 70 72 73 83  Resp: 16 16 17 18   Temp: 98.5 F (36.9 C) 98.8 F (37.1 C) 98.4 F (36.9 C) 97.9 F (36.6 C)  TempSrc: Oral Oral Oral Oral  SpO2: 100% 94% 97% 99%  Weight:  (!) 150.6 kg (332 lb 0.2 oz)    Height:        Wt Readings from Last 3 Encounters:  10/31/15 (!) 150.6 kg (332 lb 0.2 oz)  10/17/15 (!) 140.6 kg (310 lb)  10/13/15 (!) 140.6 kg (310 lb)     Intake/Output Summary (Last 24 hours) at 11/01/15 1646 Last data filed at 11/01/15 1300  Gross per 24 hour  Intake              360 ml  Output             1500 ml  Net            -1140 ml     Physical Exam  Gen: not in distress, alert and awake HEENT: moist mucosa,  rt IJ Chest: clear b/l, no added sounds, no wheezes CVS: N S1&S2, no murmurs,  GI: soft, NT, ND Musculoskeletal: warm, no edema, gluteal wound VAC in place     Data Review:    CBC  Recent Labs Lab 10/28/15 0330  WBC 11.4*  HGB 8.7*  HCT 25.3*  PLT 214  MCV 83.2  MCH 28.6  MCHC 34.4  RDW 13.2    Chemistries   Recent Labs Lab 10/28/15 0330 10/29/15 0432 10/30/15 0612 10/31/15 0552 11/01/15 0518  NA 139 137 139 138 138  K 3.8 3.4* 3.6 3.9 3.8  CL 102 100* 105 105 103  CO2 22 27 24 22 27   GLUCOSE 75 89 103* 178* 154*  BUN 34* 14 15 18 17   CREATININE 9.29* 5.78* 7.00* 7.33* 7.22*  CALCIUM 8.1* 8.0* 8.3* 8.7* 8.9   ------------------------------------------------------------------------------------------------------------------ No results for input(s): CHOL, HDL, LDLCALC, TRIG, CHOLHDL, LDLDIRECT in the last 72 hours.  Lab Results  Component Value Date   HGBA1C 11.7 (H) 10/19/2015   ------------------------------------------------------------------------------------------------------------------ No results for input(s): TSH, T4TOTAL, T3FREE, THYROIDAB in the last 72 hours.  Invalid input(s):  FREET3 ------------------------------------------------------------------------------------------------------------------ No results for input(s): VITAMINB12, FOLATE, FERRITIN, TIBC, IRON, RETICCTPCT in the last 72 hours.  Coagulation profile No results for input(s): INR, PROTIME in the last 168 hours.  No results for input(s): DDIMER in the last 72 hours.  Cardiac Enzymes No results for input(s): CKMB, TROPONINI, MYOGLOBIN in the last 168 hours.  Invalid input(s): CK ------------------------------------------------------------------------------------------------------------------    Component Value Date/Time   BNP 69.1 03/16/2015 1145    Inpatient Medications  Scheduled Meds: . buPROPion  150 mg Oral Daily  . calcium acetate  667 mg Oral TID WC  . heparin subcutaneous  5,000 Units Subcutaneous Q8H  . insulin aspart  0-20 Units Subcutaneous TID WC  . insulin aspart  0-5 Units Subcutaneous QHS  . insulin aspart  12 Units Subcutaneous TID WC  . insulin glargine  25 Units Subcutaneous QHS  . nystatin  5 mL Oral QID  . senna  2 tablet Oral BID  . sertraline  25 mg Oral Daily  . sodium chloride flush  10-40 mL Intracatheter Q12H  . sodium chloride flush  3 mL Intravenous Q12H   Continuous Infusions:   PRN Meds:.albuterol, diphenhydrAMINE, HYDROmorphone (DILAUDID) injection, lidocaine, ondansetron **OR** ondansetron (ZOFRAN) IV, oxyCODONE, phenol, promethazine, senna-docusate, sodium chloride flush, sodium chloride flush  Micro Results No results found for this or any previous visit (from the past 240 hour(s)).  Radiology Reports Ct Abdomen Pelvis W Contrast  Result Date: 10/13/2015 CLINICAL DATA:  Possible perirectal abscess. Further evaluation requested. Initial encounter. EXAM: CT ABDOMEN AND PELVIS WITH CONTRAST TECHNIQUE: Multidetector CT imaging of the abdomen and pelvis was performed using the standard protocol following bolus administration of intravenous contrast.  CONTRAST:  ISOVUE-300 IOPAMIDOL (ISOVUE-300) INJECTION 61% COMPARISON:  CT of the abdomen and pelvis from 06/21/2014 FINDINGS: Lower chest: The visualized lung bases are grossly clear. The visualized portions of the mediastinum are unremarkable. Hepatobiliary: The liver is unremarkable in appearance. Stones are noted dependently within the gallbladder. The gallbladder is otherwise unremarkable. The common bile duct remains normal in caliber. Pancreas: The pancreas is within normal limits. Spleen: The spleen is unremarkable in appearance. Adrenals/Urinary Tract: The adrenal glands are unremarkable in appearance. The kidneys are within normal limits. There is no evidence of hydronephrosis. No renal or ureteral stones are identified. No perinephric stranding is seen. Stomach/Bowel: The stomach is unremarkable in appearance. The small bowel is within normal limits. The appendix is not visualized; there is no evidence for appendicitis. The colon is unremarkable in appearance. Vascular/Lymphatic: The abdominal aorta is unremarkable in appearance. The inferior vena cava is grossly unremarkable. No retroperitoneal lymphadenopathy is seen. No pelvic sidewall lymphadenopathy is identified. Reproductive: The bladder is decompressed and not well assessed. The uterus contains a large 8.6  cm fibroid, with minimal calcification. No suspicious adnexal masses are seen. The ovaries are grossly symmetric. There is mild apparent soft tissue prominence at the cervix and vagina, of uncertain significance. If the patient's symptoms persist, pelvic ultrasound would be helpful for further evaluation. The anorectal canal is unremarkable in appearance. There is no evidence of perirectal abscess. Other: Minimal nonspecific skin thickening is noted at the anterior left mid abdomen. Musculoskeletal: No acute osseous abnormalities are identified. The visualized musculature is unremarkable in appearance. IMPRESSION: 1. No acute abnormality  seen to explain the patient's symptoms. No evidence of perirectal abscess. The anorectal canal is unremarkable in appearance. 2. Mild apparent soft tissue prominence at the cervix and vagina, of uncertain significance. If the patient's symptoms persist, pelvic ultrasound would be helpful for further evaluation. 3. Cholelithiasis.  Gallbladder otherwise unremarkable. 4. Large 8.6 cm uterine fibroid noted. Electronically Signed   By: Roanna RaiderJeffery  Chang M.D.   On: 10/13/2015 23:05   [place] Renal  Result Date: 10/21/2015 CLINICAL DATA:  Acute renal failure. EXAM: RENAL / URINARY TRACT ULTRASOUND COMPLETE COMPARISON:  None. FINDINGS: Right Kidney: Length: 16.6 cm. Echogenicity within normal limits. No mass or hydronephrosis visualized. Left Kidney: Length: 15.9 cm. Echogenicity within normal limits. No mass or hydronephrosis visualized. Bladder: Appears normal for degree of bladder distention. IMPRESSION: 1. Relatively large kidneys which may be normal variant for this patient. 2. No other abnormality.  No hydronephrosis. Electronically Signed   By: Amie Portlandavid  Ormond M.D.   On: 10/21/2015 12:30   Ir Fluoro Guide Cv Line Right  Result Date: 10/27/2015 INDICATION: 45 year old female with a history of acute renal failure. EXAM: TUNNELED CENTRAL VENOUS HEMODIALYSIS CATHETER PLACEMENT WITH ULTRASOUND AND FLUOROSCOPIC GUIDANCE MEDICATIONS: None ANESTHESIA/SEDATION: None FLUOROSCOPY TIME:  Fluoroscopy Time: 0 minutes 6 seconds (19.4 mGy). COMPLICATIONS: None PROCEDURE: Informed written consent was obtained from the patient and the patient's family after a thorough discussion of the procedural risks, benefits and alternatives. All questions were addressed. A timeout was performed prior to the initiation of the procedure. The right neck and chest was prepped with chlorhexidine, and draped in the usual sterile fashion using maximum barrier technique (cap and mask, sterile gown, sterile gloves, large sterile sheet, hand hygiene and  cutaneous antiseptic). Local anesthesia was attained by infiltration with 1% lidocaine without epinephrine. Ultrasound demonstrated patency of the right internal jugular vein, and this was documented with an image. Under real-time ultrasound guidance, this vein was accessed with a 21 gauge micropuncture needle and image documentation was performed. A small dermatotomy was made at the access site with an 11 scalpel. A 0.018" wire was advanced into the SVC and the access needle exchanged for a 75F micropuncture vascular sheath. The 0.018" wire was then removed and a 0.035" wire advanced into the IVC. Upon withdrawal of the 018 wire, the wire was marked for appropriate length of the internal portion of the catheter. A 20 cm catheter was selected. Skin and subcutaneous tissues were serially dilated. Catheter was placed on the wire. The catheter tip is positioned in the upper right atrium. This was documented with a spot image. Both ports of the hemodialysis catheter were then tested for excellent function. The ports were then locked with heparinized lock. Patient tolerated the procedure well and remained hemodynamically stable throughout. No complications were encountered and no significant blood loss was encountered. IMPRESSION: Status post right IJ temporary HD catheter placement. Catheter ready for use. Signed, Yvone NeuJaime S. Loreta AveWagner, DO [facility] Electronically Signed  By: Gilmer Mor D.O.   On: 10/27/2015 13:38   Ir US Guide Vasc Access Right  Result Date: 10/27/2015 INDICATION: 45 year old female with a history of acute renal failure. EXAM: TUNNELED CENTRAL VENOUS HEMODIALYSIS CATHETER PLACEMENT WITH ULTRASOUND AND FLUOROSCOPIC GUIDANCE MEDICATIONS: None ANESTHESIA/SEDATION: None FLUOROSCOPY TIME:  Fluoroscopy Time: 0 minutes 6 seconds (19.4 mGy). COMPLICATIONS: None PROCEDURE: Informed written consent was obtained from the patient and the patient's  family after a thorough discussion of the procedural risks, benefits and alternatives. All questions were addressed. A timeout was performed prior to the initiation of the procedure. The right neck and chest was prepped with chlorhexidine, and draped in the usual sterile fashion using maximum barrier technique (cap and mask, sterile gown, sterile gloves, large sterile sheet, hand hygiene and cutaneous antiseptic). Local anesthesia was attained by infiltration with 1% lidocaine without epinephrine. Ultrasound demonstrated patency of the right internal jugular vein, and this was documented with an image. Under real-time ultrasound guidance, this vein was accessed with a 21 gauge micropuncture needle and image documentation was performed. A small dermatotomy was made at the access site with an 11 scalpel. A 0.018" wire was advanced into the SVC and the access needle exchanged for a 6F micropuncture vascular sheath. The 0.018" wire was then removed and a 0.035" wire advanced into the IVC. Upon withdrawal of the 018 wire, the wire was marked for appropriate length of the internal portion of the catheter. A 20 cm catheter was selected. Skin and subcutaneous tissues were serially dilated. Catheter was placed on the wire. The catheter tip is positioned in the upper right atrium. This was documented with a spot image. Both ports of the hemodialysis catheter were then tested for excellent function. The ports were then locked with heparinized lock. Patient tolerated the procedure well and remained hemodynamically stable throughout. No complications were encountered and no significant blood loss was encountered. IMPRESSION: Status post right IJ temporary HD catheter placement. Catheter ready for use. Signed, Yvone Neu. Loreta Ave, DO [facility] Electronically Signed   By: Gilmer Mor D.O.   On: 10/27/2015 13:38    Time Spent in minutes  25   Penny Pia M.D on  11/01/2015 at 4:46 PM  Between 7am to 7pm - Pager - [phone]  After 7pm go to [email or web address] - password [facility]  [facility] -  Office  [phone]

## 2015-11-01 NOTE — Care Management Note (Signed)
Case Management Note  Patient Details  Name: [name] MRN: [id number] Date of Birth: January 27, 1971  Subjective/Objective:               CM following for progression and d/c planning     Action/Plan: 11/01/2015 VAC application placed on chart and NP, Marisue IvanLiz notified of need for signatures and completion of application as well as HH orders.  11/02/2015 [facility] and [facility]. CM will place orders as orders are currently within CM consult. [facility] notified.  Still awaiting completion of VAC application.   Expected Discharge Date:   (UNKNOWN)               Expected Discharge Plan:  Home w Home Health Services  In-House Referral:     Discharge planning Services  CM Consult  Post Acute Care Choice:  Home Health, Durable Medical Equipment Choice offered to:  Patient  DME Arranged:  Vac DME Agency:  [facility]  HH Arranged[facility]  [facility]. HH Agency:  [facility]  Status of Service:  In process, will continue to follow  If discussed at Long Length of Stay Meetings, dates discussed:    Additional Comments:  Starlyn SkeansRoyal, Lenetta Piche U, RN 11/01/2015, 4:38 PM

## 2015-11-01 NOTE — Progress Notes (Signed)
[facility] Progress Note  11 Days Post-Op  Subjective: No complaints. Pain controlled. Tolerating diet. +BMs  Objective: Vital signs in last 24 hours: Temp:  [97.9 F (36.6 C)-98.8 F (37.1 C)] 97.9 F (36.6 C) (09/27 1000) Pulse Rate:  [70-83] 83 (09/27 1000) Resp:  [16-18] 18 (09/27 1000) BP: (104-143)/(47-89) 121/89 (09/27 1000) SpO2:  [94 %-100 %] 99 % (09/27 1000) Weight:  [332 lb 0.2 oz (150.6 kg)] 332 lb 0.2 oz (150.6 kg) (09/26 2202) Last BM Date: 10/30/15  Intake/Output from previous day: 09/26 0701 - 09/27 0700 In: 840 [P.O.:840] Out: 1500 [Urine:1500] Intake/Output this shift: Total I/O In: 120 [P.O.:120] Out: 450 [Urine:450]  PE: Gen: alert, NAD, pleasant Abd: Soft, NT Wound: mostly granulation tissue, minimal cloudy liquid at wound base.      Lab Results:  Recent Labs (last 2 labs)   No results for input(s): WBC, HGB, HCT, PLT in the last 72 hours.   BMET  Recent Labs (last 2 labs)    Recent Labs  10/31/15 0552 11/01/15 0518  NA 138 138  K 3.9 3.8  CL 105 103  CO2 22 27  GLUCOSE 178* 154*  BUN 18 17  CREATININE 7.33* 7.22*  CALCIUM 8.7* 8.9     PT/INR Recent Labs (last 2 labs)   No results for input(s): LABPROT, INR in the last 72 hours.   CMP     Labs (Brief)          Component Value Date/Time   NA 138 11/01/2015 0518   NA 138 05/05/2013 1649   K 3.8 11/01/2015 0518   CL 103 11/01/2015 0518   CO2 27 11/01/2015 0518   GLUCOSE 154 (H) 11/01/2015 0518   BUN 17 11/01/2015 0518   BUN 11 05/05/2013 1649   CREATININE 7.22 (H) 11/01/2015 0518   CALCIUM 8.9 11/01/2015 0518   PROT 7.5 10/13/2015 1554   PROT 7.7 05/05/2013 1649   ALBUMIN 2.1 (L) 11/01/2015 0518   ALBUMIN 3.7 05/05/2013 1649   AST 33 10/13/2015 1554   ALT 25 10/13/2015 1554   ALKPHOS 123 10/13/2015 1554   BILITOT 0.7 10/13/2015 1554   GFRNONAA 6 (L) 11/01/2015 0518   GFRAA 7 (L) 11/01/2015 0518     Lipase  Labs  (Brief)          Component Value Date/Time   LIPASE 18 (L) 09/02/2014 1743     Studies/Results: Imaging Results (Last 48 hours)  No results found.    Anti-infectives:           Anti-infectives    Start     Dose/Rate Route Frequency Ordered Stop   10/25/15 1200  amoxicillin-clavulanate (AUGMENTIN) 500-125 MG per tablet 500 mg     1 tablet Oral Every 24 hours 10/25/15 0939 10/31/15 1745   10/22/15 1300  cefTRIAXone (ROCEPHIN) 1 g in dextrose 5 % 50 mL IVPB  Status:  Discontinued     1 g 100 mL/hr over 30 Minutes Intravenous Every 24 hours 10/22/15 1245 10/25/15 0939   10/21/15 1200  doxycycline (VIBRA-TABS) tablet 100 mg  Status:  Discontinued     100 mg Oral Every 12 hours 10/21/15 0859 10/22/15 1246   10/19/15 2000  vancomycin (VANCOCIN) 1,250 mg in sodium chloride 0.9 % 250 mL IVPB  Status:  Discontinued     1,250 mg 166.7 mL/hr over 90 Minutes Intravenous Every 8 hours 10/19/15 1255 10/20/15 2014   10/19/15 1800  piperacillin-tazobactam (ZOSYN) IVPB 3.375 g  Status:  Discontinued  3.375 g 12.5 mL/hr over 240 Minutes Intravenous Every 8 hours 10/19/15 1255 10/21/15 0734   10/19/15 1030  vancomycin (VANCOCIN) 2,500 mg in sodium chloride 0.9 % 500 mL IVPB     2,500 mg 250 mL/hr over 120 Minutes Intravenous  Once 10/19/15 1007 10/19/15 1304   10/19/15 1015  piperacillin-tazobactam (ZOSYN) IVPB 3.375 g     3.375 g 100 mL/hr over 30 Minutes Intravenous STAT 10/19/15 1007 10/19/15 1128     Assessment/Plan Presacral skin and soft tissue infection S/p Irrigation and debridement in ORDr. Claud KelpHaywood Ingram 10/19/15 - 200 cc/24hr serosanguinous output in VAC cannister - Continue VAC changes scheduled for MWF  ARF - per medicine  ID- zosyn 9/14>>9/16, vanc 9/14>>9/15, rocephin 9/17>> FEN -carb modified VTE - lovenox   Plan - continue current wound care with VAC changes M/W/F. [facility] nursing for  wound care. She will follow up with Dr. Derrell LollingIngram in our office in 1-2 weeks. General surgery will sign off. Call with questions or concerns.  Hosie SpangleElizabeth Simaan, PA-C [facility] Pager: [phone] Consults: [phone] Mon-Fri 7:00 am-4:30 pm Sat-Sun 7:00 am-11:30 am

## 2015-11-01 NOTE — Care Management Note (Addendum)
Case Management Note  Patient Details  Name: [name] MRN: [id number] Date of Birth: 08/22/1970  Subjective/Objective:        CM following for progression and d/c planning.             Action/Plan: 3/38/2505 VAC application on front of chart for surgery team to complete call placed to PA re need for orders fro [facility] and [place] services. Met with pt re HH needs.  [place] with Kathlee Nations of Surgical team, orders and application will be completed tomorrow. 11/02/2015  Expected Discharge Date:   (UNKNOWN)               Expected Discharge Plan:  [place]  In-House Referral:     Discharge planning Services  CM Consult  Post Acute Care Choice:  Home Health, Durable Medical Equipment Choice offered to:  Patient  DME Arranged:  Vac DME Agency:  [place] Arranged:  RN [facility] Agency:  [place]  Status of Service:  In process, will continue to follow  If discussed at Long Length of Stay Meetings, dates discussed:    Additional Comments:  Adron Bene, RN 11/01/2015, 4:39 PM

## 2015-11-01 NOTE — Progress Notes (Deleted)
Patient ID: [name], female   DOB: 1970/07/03, 45 y.o.   MRN: [id number]  [facility] Progress Note  11 Days Post-Op  Subjective: No complaints. Pain controlled. Tolerating diet. +BMs  Objective: Vital signs in last 24 hours: Temp:  [97.9 F (36.6 C)-98.8 F (37.1 C)] 97.9 F (36.6 C) (09/27 1000) Pulse Rate:  [70-83] 83 (09/27 1000) Resp:  [16-18] 18 (09/27 1000) BP: (104-143)/(47-89) 121/89 (09/27 1000) SpO2:  [94 %-100 %] 99 % (09/27 1000) Weight:  [332 lb 0.2 oz (150.6 kg)] 332 lb 0.2 oz (150.6 kg) (09/26 2202) Last BM Date: 10/30/15  Intake/Output from previous day: 09/26 0701 - 09/27 0700 In: 840 [P.O.:840] Out: 1500 [Urine:1500] Intake/Output this shift: Total I/O In: 120 [P.O.:120] Out: 450 [Urine:450]  PE: Gen: alert, NAD, pleasant Abd: Soft, NT Wound: mostly granulation tissue, minimal cloudy liquid at wound base.      Lab Results:  No results for input(s): WBC, HGB, HCT, PLT in the last 72 hours. BMET  Recent Labs  10/31/15 0552 11/01/15 0518  NA 138 138  K 3.9 3.8  CL 105 103  CO2 22 27  GLUCOSE 178* 154*  BUN 18 17  CREATININE 7.33* 7.22*  CALCIUM 8.7* 8.9   PT/INR No results for input(s): LABPROT, INR in the last 72 hours. CMP     Component Value Date/Time   NA 138 11/01/2015 0518   NA 138 05/05/2013 1649   K 3.8 11/01/2015 0518   CL 103 11/01/2015 0518   CO2 27 11/01/2015 0518   GLUCOSE 154 (H) 11/01/2015 0518   BUN 17 11/01/2015 0518   BUN 11 05/05/2013 1649   CREATININE 7.22 (H) 11/01/2015 0518   CALCIUM 8.9 11/01/2015 0518   PROT 7.5 10/13/2015 1554   PROT 7.7 05/05/2013 1649   ALBUMIN 2.1 (L) 11/01/2015 0518   ALBUMIN 3.7 05/05/2013 1649   AST 33 10/13/2015 1554   ALT 25 10/13/2015 1554   ALKPHOS 123 10/13/2015 1554   BILITOT 0.7 10/13/2015 1554   GFRNONAA 6 (L) 11/01/2015 0518   GFRAA 7 (L) 11/01/2015 0518   Lipase     Component Value Date/Time   LIPASE 18 (L) 09/02/2014 1743    Studies/Results: No results found.  Anti-infectives: Anti-infectives    Start     Dose/Rate Route Frequency Ordered Stop   10/25/15 1200  amoxicillin-clavulanate (AUGMENTIN) 500-125 MG per tablet 500 mg     1 tablet Oral Every 24 hours 10/25/15 0939 10/31/15 1745   10/22/15 1300  cefTRIAXone (ROCEPHIN) 1 g in dextrose 5 % 50 mL IVPB  Status:  Discontinued     1 g 100 mL/hr over 30 Minutes Intravenous Every 24 hours 10/22/15 1245 10/25/15 0939   10/21/15 1200  doxycycline (VIBRA-TABS) tablet 100 mg  Status:  Discontinued     100 mg Oral Every 12 hours 10/21/15 0859 10/22/15 1246   10/19/15 2000  vancomycin (VANCOCIN) 1,250 mg in sodium chloride 0.9 % 250 mL IVPB  Status:  Discontinued     1,250 mg 166.7 mL/hr over 90 Minutes Intravenous Every 8 hours 10/19/15 1255 10/20/15 2014   10/19/15 1800  piperacillin-tazobactam (ZOSYN) IVPB 3.375 g  Status:  Discontinued     3.375 g 12.5 mL/hr over 240 Minutes Intravenous Every 8 hours 10/19/15 1255 10/21/15 0734   10/19/15 1030  vancomycin (VANCOCIN) 2,500 mg in sodium chloride 0.9 % 500 mL IVPB     2,500 mg 250 mL/hr over 120 Minutes Intravenous  Once 10/19/15 1007  10/19/15 1304   10/19/15 1015  piperacillin-tazobactam (ZOSYN) IVPB 3.375 g     3.375 g 100 mL/hr over 30 Minutes Intravenous STAT 10/19/15 1007 10/19/15 1128       Assessment/Plan Presacral skin and soft tissue infection POD #13Irrigation and debridement in ORDr. Claud KelpHaywood Ingram 10/19/15 - 200 cc/24hr serosanguinous output in VAC cannister - Continue VAC changes scheduled for MWF  ARF - per medicine  ID- zosyn 9/14>>9/16, vanc 9/14>>9/15, rocephin 9/17>> FEN -carb modified VTE - lovenox   Plan - continue current wound care with VAC changes M/W/F. [facility] and home health nursing for wound care. She will follow up with Dr. Derrell LollingIngram in our office in 1-2 weeks. General surgery will sign off. Call with questions or concerns.   LOS:  13 days    Edson SnowballBROOKE A Price , [facility] : [facility] 11/01/2015, 1:20 PM Pager: [phone] Consults: [phone] Mon-Fri 7:00 am-4:30 pm Sat-Sun 7:00 am-11:30 am

## 2015-11-01 NOTE — Progress Notes (Signed)
Subjective: Interval History: has no complaint of , to get vacchanged, feels ok.  Objective: Vital signs in last 24 hours: Temp:  [98.4 F (36.9 C)-98.8 F (37.1 C)] 98.4 F (36.9 C) (09/27 0525) Pulse Rate:  [70-73] 73 (09/27 0525) Resp:  [16-17] 17 (09/27 0525) BP: (104-143)/(47-61) 104/47 (09/27 0525) SpO2:  [94 %-100 %] 97 % (09/27 0525) Weight:  [150.6 kg (332 lb 0.2 oz)] 150.6 kg (332 lb 0.2 oz) (09/26 2202) Weight change: 0.006 kg (0.2 oz)  Intake/Output from previous day: 09/26 0701 - 09/27 0700 In: 840 [P.O.:840] Out: 1500 [Urine:1500] Intake/Output this shift: No intake/output data recorded.  General appearance: alert, cooperative, no distress, morbidly obese and pale Neck: RIJ cath Resp: RIJ cath Cardio: regular rate and rhythm, S1, S2 normal and systolic murmur: holosystolic 2/6, blowing throughout the precordium GI: massive, pos bs, soft Extremities: extremities normal, atraumatic, no cyanosis or edema  Resp no R,R,or W  Lab Results: No results for input(s): WBC, HGB, HCT, PLT in the last 72 hours. BMET:  Recent Labs  10/31/15 0552 11/01/15 0518  NA 138 138  K 3.9 3.8  CL 105 103  CO2 22 27  GLUCOSE 178* 154*  BUN 18 17  CREATININE 7.33* 7.22*  CALCIUM 8.7* 8.9   No results for input(s): PTH in the last 72 hours. Iron Studies: No results for input(s): IRON, TIBC, TRANSFERRIN, FERRITIN in the last 72 hours.  Studies/Results: No results found.  I have reviewed the patient's current medications.  Assessment/Plan: 1 AKI Cr plateau,nonoliguric, vol/K/acid base ok. 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    LOS: 13 days   Jacoria Keiffer L 11/01/2015,9:48 AM

## 2015-11-02 LAB — GLUCOSE, CAPILLARY
GLUCOSE-CAPILLARY: 135 mg/dL — AB (ref 65–99)
GLUCOSE-CAPILLARY: 118 mg/dL — AB (ref 65–99)
Glucose-Capillary: 118 mg/dL — ABNORMAL HIGH (ref 65–99)
Glucose-Capillary: 154 mg/dL — ABNORMAL HIGH (ref 65–99)

## 2015-11-02 LAB — RENAL FUNCTION PANEL
Albumin: 2.2 g/dL — ABNORMAL LOW (ref 3.5–5.0)
Anion gap: 9 (ref 5–15)
BUN: 17 mg/dL (ref 6–20)
CHLORIDE: 104 mmol/L (ref 101–111)
CO2: 25 mmol/L (ref 22–32)
CREATININE: 6.66 mg/dL — AB (ref 0.44–1.00)
Calcium: 8.9 mg/dL (ref 8.9–10.3)
GFR calc non Af Amer: 7 mL/min — ABNORMAL LOW (ref >60)
GFR, EST AFRICAN AMERICAN: 8 mL/min — AB (ref >60)
GLUCOSE: 107 mg/dL — AB (ref 65–99)
Phosphorus: 4.9 mg/dL — ABNORMAL HIGH (ref 2.5–4.6)
Potassium: 3.6 mmol/L (ref 3.5–5.1)
SODIUM: 138 mmol/L (ref 135–145)

## 2015-11-02 NOTE — Progress Notes (Signed)
Subjective: Interval History: has no complaint .  Objective: Vital signs in last 24 hours: Temp:  [98 F (36.7 C)-99.2 F (37.3 C)] 98.6 F (37 C) (09/28 1000) Pulse Rate:  [72-78] 72 (09/28 1000) Resp:  [18] 18 (09/28 1000) BP: (116-136)/(62-71) 136/71 (09/28 1000) SpO2:  [93 %-99 %] 99 % (09/28 1000) Weight:  [150.6 kg (332 lb 0.2 oz)] 150.6 kg (332 lb 0.2 oz) (09/27 2056) Weight change: 0 kg (0 lb)  Intake/Output from previous day: 09/27 0701 - 09/28 0700 In: 240 [P.O.:240] Out: 1725 [Urine:1525; Drains:200] Intake/Output this shift: No intake/output data recorded.  General appearance: alert, cooperative, morbidly obese and pale Neck: RIJ cath Resp: clear to auscultation bilaterally Cardio: S1, S2 normal and systolic murmur: holosystolic 2/6, blowing at apex GI: massive, pos bs, soft Extremities: extremities normal, atraumatic, no cyanosis or edema  Lab Results: No results for input(s): WBC, HGB, HCT, PLT in the last 72 hours. BMET:  Recent Labs  11/01/15 0518 11/02/15 0231  NA 138 138  K 3.8 3.6  CL 103 104  CO2 27 25  GLUCOSE 154* 107*  BUN 17 17  CREATININE 7.22* 6.66*  CALCIUM 8.9 8.9   No results for input(s): PTH in the last 72 hours. Iron Studies: No results for input(s): IRON, TIBC, TRANSFERRIN, FERRITIN in the last 72 hours.  Studies/Results: No results found.  I have reviewed the patient's current medications.  Assessment/Plan: 1 AKI improved Cr, acid/base/k ok.  If better tomorrow, take cath out 2 Sacral abscess local care 3 Massive obesity needs dealt with 4 DM controlle P follow chem, allow to diurese,  Cath out tomorrow if better.    LOS: 14 days   [name] 11/02/2015,10:26 AM

## 2015-11-02 NOTE — Progress Notes (Signed)
PROGRESS NOTE                                                                                                                                                                                                             Patient Demographics:    [name], is a 45 y.o. female, DOB - 1970-08-01, UEA:[id number]  Admit date - 10/19/2015   Admitting Physician Joseph Art, DO  Outpatient Primary MD for the patient is Verlon Au, MD  LOS - 14  Outpatient Specialists:None  Chief Complaint  Patient presents with  . Cellulitis       Brief Narrative   45 year old morbidly obese female with poorly controlled diabetes seen in the ED 3 times in the past 2 weeks for cellulitis of the perirectal area without abscess. She was sent home on Keflex but returned back to the ED stating worse. In the ED she was found to be septic with worsened cellulitis with necrotizing soft tissue infection . She was started on empiric IV vancomycin and Zosyn. Patient underwent I&D of the skin, subcutaneous tissue, fascia of presacral soft tissue on 9/15, followed by wound VAC placement. Patient developed progressive acute kidney injury and transferred to [facility] for need of dialysis.   Subjective:   Pt has no new complaints. No acute issues reported overnight.    Assessment  & Plan :    Principal Problem:   Necrotizing soft tissue infection (HCC) Status post I&D. Cultures growing viridans strep and Prevotella. Antibiotics narrowed to Augmentin (Completed 2 wks of antibiotic treatment) Wound VAC per surgery.  Active Problems: Acute kidney injury Suspect ATN due to sepsis vs dehydration. Had received IV vancomycin upon admission and IV contrast on 9/8. Marland Kitchen So taking regular NSAIDs at home.  Renal ultrasound without urinary retention or hydronephrosis. Nephrology managing and plans if continued improvement is to remove catheter  tomorrow.    Diabetes mellitus, uncontrolled (HCC) Uncontrolled with A1c of 11.7. Stable on Lantus 25 units daily at bedtime and sliding scale coverage. - Blood glucose stable    Morbid obesity (HCC) Counseled on diet and exercise.   Code Status : Full code  Family  Communication  : None at bedside. Daughter updated on 9/25  Disposition Plan  : Home once renal fn improved  Barriers For Discharge : Acute renal failure needing HD  Consults  :   Surgery Renal  Procedures  :  Renal ultrasound I&D Rt IJ  DVT Prophylaxis  :  Heparin  Lab Results  Component Value Date   PLT 214 10/28/2015    Antibiotics  :    Anti-infectives    Start     Dose/Rate Route Frequency Ordered Stop   10/25/15 1200  amoxicillin-clavulanate (AUGMENTIN) 500-125 MG per tablet 500 mg     1 tablet Oral Every 24 hours 10/25/15 0939 10/31/15 1745   10/22/15 1300  cefTRIAXone (ROCEPHIN) 1 g in dextrose 5 % 50 mL IVPB  Status:  Discontinued     1 g 100 mL/hr over 30 Minutes Intravenous Every 24 hours 10/22/15 1245 10/25/15 0939   10/21/15 1200  doxycycline (VIBRA-TABS) tablet 100 mg  Status:  Discontinued     100 mg Oral Every 12 hours 10/21/15 0859 10/22/15 1246   10/19/15 2000  vancomycin (VANCOCIN) 1,250 mg in sodium chloride 0.9 % 250 mL IVPB  Status:  Discontinued     1,250 mg 166.7 mL/hr over 90 Minutes Intravenous Every 8 hours 10/19/15 1255 10/20/15 2014   10/19/15 1800  piperacillin-tazobactam (ZOSYN) IVPB 3.375 g  Status:  Discontinued     3.375 g 12.5 mL/hr over 240 Minutes Intravenous Every 8 hours 10/19/15 1255 10/21/15 0734   10/19/15 1030  vancomycin (VANCOCIN) 2,500 mg in sodium chloride 0.9 % 500 mL IVPB     2,500 mg 250 mL/hr over 120 Minutes Intravenous  Once 10/19/15 1007 10/19/15 1304   10/19/15 1015  piperacillin-tazobactam (ZOSYN) IVPB 3.375 g     3.375 g 100 mL/hr over 30 Minutes Intravenous STAT 10/19/15 1007 10/19/15 1128        Objective:   Vitals:   11/01/15 1755  11/01/15 2056 11/02/15 0512 11/02/15 1000  BP: 124/62 133/64 116/68 136/71  Pulse: 72 77 78 72  Resp: 18 18 18 18   Temp: 98 F (36.7 C) 99.2 F (37.3 C) 98.7 F (37.1 C) 98.6 F (37 C)  TempSrc: Oral Oral Oral Oral  SpO2: 98% 98% 93% 99%  Weight:  (!) 150.6 kg (332 lb 0.2 oz)    Height:        Wt Readings from Last 3 Encounters:  11/01/15 (!) 150.6 kg (332 lb 0.2 oz)  10/17/15 (!) 140.6 kg (310 lb)  10/13/15 (!) 140.6 kg (310 lb)     Intake/Output Summary (Last 24 hours) at 11/02/15 1510 Last data filed at 11/02/15 0513  Gross per 24 hour  Intake                0 ml  Output              875 ml  Net             -875 ml     Physical Exam  Gen: not in distress, alert and awake HEENT: moist mucosa,  rt IJ Chest: clear b/l, no added sounds, no wheezes CVS: N S1&S2, no murmurs,  GI: soft, NT, ND Musculoskeletal: warm, no edema, gluteal wound VAC in place     Data Review:    CBC  Recent Labs Lab 10/28/15 0330  WBC 11.4*  HGB 8.7*  HCT 25.3*  PLT 214  MCV 83.2  MCH 28.6  MCHC 34.4  RDW  13.2    Chemistries   Recent Labs Lab 10/29/15 0432 10/30/15 0612 10/31/15 0552 11/01/15 0518 11/02/15 0231  NA 137 139 138 138 138  K 3.4* 3.6 3.9 3.8 3.6  CL 100* 105 105 103 104  CO2 27 24 22 27 25   GLUCOSE 89 103* 178* 154* 107*  BUN 14 15 18 17 17   CREATININE 5.78* 7.00* 7.33* 7.22* 6.66*  CALCIUM 8.0* 8.3* 8.7* 8.9 8.9   ------------------------------------------------------------------------------------------------------------------ No results for input(s): CHOL, HDL, LDLCALC, TRIG, CHOLHDL, LDLDIRECT in the last 72 hours.  Lab Results  Component Value Date   HGBA1C 11.7 (H) 10/19/2015   ------------------------------------------------------------------------------------------------------------------ No results for input(s): TSH, T4TOTAL, T3FREE, THYROIDAB in the last 72 hours.  Invalid input(s):  FREET3 ------------------------------------------------------------------------------------------------------------------ No results for input(s): VITAMINB12, FOLATE, FERRITIN, TIBC, IRON, RETICCTPCT in the last 72 hours.  Coagulation profile No results for input(s): INR, PROTIME in the last 168 hours.  No results for input(s): DDIMER in the last 72 hours.  Cardiac Enzymes No results for input(s): CKMB, TROPONINI, MYOGLOBIN in the last 168 hours.  Invalid input(s): CK ------------------------------------------------------------------------------------------------------------------    Component Value Date/Time   BNP 69.1 03/16/2015 1145    Inpatient Medications  Scheduled Meds: . buPROPion  150 mg Oral Daily  . calcium acetate  667 mg Oral TID WC  . heparin subcutaneous  5,000 Units Subcutaneous Q8H  . insulin aspart  0-20 Units Subcutaneous TID WC  . insulin aspart  0-5 Units Subcutaneous QHS  . insulin aspart  12 Units Subcutaneous TID WC  . insulin glargine  25 Units Subcutaneous QHS  . nystatin  5 mL Oral QID  . senna  2 tablet Oral BID  . sertraline  25 mg Oral Daily  . sodium chloride flush  10-40 mL Intracatheter Q12H  . sodium chloride flush  3 mL Intravenous Q12H   Continuous Infusions:   PRN Meds:.albuterol, diphenhydrAMINE, HYDROmorphone (DILAUDID) injection, lidocaine, ondansetron **OR** ondansetron (ZOFRAN) IV, oxyCODONE, phenol, promethazine, senna-docusate, sodium chloride flush, sodium chloride flush  Micro Results No results found for this or any previous visit (from the past 240 hour(s)).  Radiology Reports Ct Abdomen Pelvis W Contrast  Result Date: 10/13/2015 CLINICAL DATA:  Possible perirectal abscess. Further evaluation requested. Initial encounter. EXAM: CT ABDOMEN AND PELVIS WITH CONTRAST TECHNIQUE: Multidetector CT imaging of the abdomen and pelvis was performed using the standard protocol following bolus administration of intravenous contrast.  CONTRAST:  ISOVUE-300 IOPAMIDOL (ISOVUE-300) INJECTION 61% COMPARISON:  CT of the abdomen and pelvis from 06/21/2014 FINDINGS: Lower chest: The visualized lung bases are grossly clear. The visualized portions of the mediastinum are unremarkable. Hepatobiliary: The liver is unremarkable in appearance. Stones are noted dependently within the gallbladder. The gallbladder is otherwise unremarkable. The common bile duct remains normal in caliber. Pancreas: The pancreas is within normal limits. Spleen: The spleen is unremarkable in appearance. Adrenals/Urinary Tract: The adrenal glands are unremarkable in appearance. The kidneys are within normal limits. There is no evidence of hydronephrosis. No renal or ureteral stones are identified. No perinephric stranding is seen. Stomach/Bowel: The stomach is unremarkable in appearance. The small bowel is within normal limits. The appendix is not visualized; there is no evidence for appendicitis. The colon is unremarkable in appearance. Vascular/Lymphatic: The abdominal aorta is unremarkable in appearance. The inferior vena cava is grossly unremarkable. No retroperitoneal lymphadenopathy is seen. No pelvic sidewall lymphadenopathy is identified. Reproductive: The bladder is decompressed and not well assessed. The uterus contains a large 8.6 cm fibroid, with minimal  calcification. No suspicious adnexal masses are seen. The ovaries are grossly symmetric. There is mild apparent soft tissue prominence at the cervix and vagina, of uncertain significance. If the patient's symptoms persist, pelvic ultrasound would be helpful for further evaluation. The anorectal canal is unremarkable in appearance. There is no evidence of perirectal abscess. Other: Minimal nonspecific skin thickening is noted at the anterior left mid abdomen. Musculoskeletal: No acute osseous abnormalities are identified. The visualized musculature is unremarkable in appearance. IMPRESSION: 1. No acute abnormality  seen to explain the patient's symptoms. No evidence of perirectal abscess. The anorectal canal is unremarkable in appearance. 2. Mild apparent soft tissue prominence at the cervix and vagina, of uncertain significance. If the patient's symptoms persist, pelvic ultrasound would be helpful for further evaluation. 3. Cholelithiasis.  Gallbladder otherwise unremarkable. 4. Large 8.6 cm uterine fibroid noted. Electronically Signed   By: Roanna RaiderJeffery  Chang M.D.   On: 10/13/2015 23:05   [place] Renal  Result Date: 10/21/2015 CLINICAL DATA:  Acute renal failure. EXAM: RENAL / URINARY TRACT ULTRASOUND COMPLETE COMPARISON:  None. FINDINGS: Right Kidney: Length: 16.6 cm. Echogenicity within normal limits. No mass or hydronephrosis visualized. Left Kidney: Length: 15.9 cm. Echogenicity within normal limits. No mass or hydronephrosis visualized. Bladder: Appears normal for degree of bladder distention. IMPRESSION: 1. Relatively large kidneys which may be normal variant for this patient. 2. No other abnormality.  No hydronephrosis. Electronically Signed   By: Amie Portlandavid  Ormond M.D.   On: 10/21/2015 12:30   Ir Fluoro Guide Cv Line Right  Result Date: 10/27/2015 INDICATION: 45 year old female with a history of acute renal failure. EXAM: TUNNELED CENTRAL VENOUS HEMODIALYSIS CATHETER PLACEMENT WITH ULTRASOUND AND FLUOROSCOPIC GUIDANCE MEDICATIONS: None ANESTHESIA/SEDATION: None FLUOROSCOPY TIME:  Fluoroscopy Time: 0 minutes 6 seconds (19.4 mGy). COMPLICATIONS: None PROCEDURE: Informed written consent was obtained from the patient and the patient's family after a thorough discussion of the procedural risks, benefits and alternatives. All questions were addressed. A timeout was performed prior to the initiation of the procedure. The right neck and chest was prepped with chlorhexidine, and draped in the usual sterile fashion using maximum barrier technique (cap and mask, sterile gown, sterile gloves, large sterile sheet, hand hygiene and  cutaneous antiseptic). Local anesthesia was attained by infiltration with 1% lidocaine without epinephrine. Ultrasound demonstrated patency of the right internal jugular vein, and this was documented with an image. Under real-time ultrasound guidance, this vein was accessed with a 21 gauge micropuncture needle and image documentation was performed. A small dermatotomy was made at the access site with an 11 scalpel. A 0.018" wire was advanced into the SVC and the access needle exchanged for a 65F micropuncture vascular sheath. The 0.018" wire was then removed and a 0.035" wire advanced into the IVC. Upon withdrawal of the 018 wire, the wire was marked for appropriate length of the internal portion of the catheter. A 20 cm catheter was selected. Skin and subcutaneous tissues were serially dilated. Catheter was placed on the wire. The catheter tip is positioned in the upper right atrium. This was documented with a spot image. Both ports of the hemodialysis catheter were then tested for excellent function. The ports were then locked with heparinized lock. Patient tolerated the procedure well and remained hemodynamically stable throughout. No complications were encountered and no significant blood loss was encountered. IMPRESSION: Status post right IJ temporary HD catheter placement. Catheter ready for use. Signed, Yvone NeuJaime S. Loreta AveWagner, DO [facility] Electronically Signed   By: Marijean NiemannJaime  Loreta Ave D.O.   On: 10/27/2015 13:38   Ir US Guide Vasc Access Right  Result Date: 10/27/2015 INDICATION: 45 year old female with a history of acute renal failure. EXAM: TUNNELED CENTRAL VENOUS HEMODIALYSIS CATHETER PLACEMENT WITH ULTRASOUND AND FLUOROSCOPIC GUIDANCE MEDICATIONS: None ANESTHESIA/SEDATION: None FLUOROSCOPY TIME:  Fluoroscopy Time: 0 minutes 6 seconds (19.4 mGy). COMPLICATIONS: None PROCEDURE: Informed written consent was obtained from the patient and the patient's  family after a thorough discussion of the procedural risks, benefits and alternatives. All questions were addressed. A timeout was performed prior to the initiation of the procedure. The right neck and chest was prepped with chlorhexidine, and draped in the usual sterile fashion using maximum barrier technique (cap and mask, sterile gown, sterile gloves, large sterile sheet, hand hygiene and cutaneous antiseptic). Local anesthesia was attained by infiltration with 1% lidocaine without epinephrine. Ultrasound demonstrated patency of the right internal jugular vein, and this was documented with an image. Under real-time ultrasound guidance, this vein was accessed with a 21 gauge micropuncture needle and image documentation was performed. A small dermatotomy was made at the access site with an 11 scalpel. A 0.018" wire was advanced into the SVC and the access needle exchanged for a 54F micropuncture vascular sheath. The 0.018" wire was then removed and a 0.035" wire advanced into the IVC. Upon withdrawal of the 018 wire, the wire was marked for appropriate length of the internal portion of the catheter. A 20 cm catheter was selected. Skin and subcutaneous tissues were serially dilated. Catheter was placed on the wire. The catheter tip is positioned in the upper right atrium. This was documented with a spot image. Both ports of the hemodialysis catheter were then tested for excellent function. The ports were then locked with heparinized lock. Patient tolerated the procedure well and remained hemodynamically stable throughout. No complications were encountered and no significant blood loss was encountered. IMPRESSION: Status post right IJ temporary HD catheter placement. Catheter ready for use. Signed, Yvone Neu. Loreta Ave, DO [facility] Electronically Signed   By: Gilmer Mor D.O.   On: 10/27/2015 13:38    Time Spent in minutes  25   Penny Pia M.D on  11/02/2015 at 3:10 PM  Between 7am to 7pm - Pager - [phone]  After 7pm go to [email or web address] - password [facility]  [facility] -  Office  [phone]

## 2015-11-03 LAB — RENAL FUNCTION PANEL
ANION GAP: 9 (ref 5–15)
Albumin: 2.3 g/dL — ABNORMAL LOW (ref 3.5–5.0)
BUN: 17 mg/dL (ref 6–20)
CHLORIDE: 104 mmol/L (ref 101–111)
CO2: 26 mmol/L (ref 22–32)
Calcium: 9.1 mg/dL (ref 8.9–10.3)
Creatinine, Ser: 5.63 mg/dL — ABNORMAL HIGH (ref 0.44–1.00)
GFR calc Af Amer: 10 mL/min — ABNORMAL LOW (ref >60)
GFR, EST NON AFRICAN AMERICAN: 8 mL/min — AB (ref >60)
GLUCOSE: 120 mg/dL — AB (ref 65–99)
PHOSPHORUS: 4.8 mg/dL — AB (ref 2.5–4.6)
POTASSIUM: 3.6 mmol/L (ref 3.5–5.1)
SODIUM: 139 mmol/L (ref 135–145)

## 2015-11-03 LAB — GLUCOSE, CAPILLARY
GLUCOSE-CAPILLARY: 121 mg/dL — AB (ref 65–99)
GLUCOSE-CAPILLARY: 85 mg/dL (ref 65–99)
Glucose-Capillary: 95 mg/dL (ref 65–99)

## 2015-11-03 MED ORDER — ONDANSETRON HCL 4 MG PO TABS: 4.0000 mg | ORAL_TABLET | Freq: Three times a day (TID) | ORAL | Status: DC | PRN

## 2015-11-03 MED ORDER — INSULIN GLARGINE 100 UNIT/ML SOLOSTAR PEN: 25.0000 [IU] | PEN_INJECTOR | Freq: Every day | SUBCUTANEOUS | 11 refills | Status: DC

## 2015-11-03 NOTE — Progress Notes (Signed)
Pt for disch today. Vac form has been signed and [facility] working on Radiographer, therapeuticgetting machine. Dr Cena Bentonvega states if unable to get this pm can do wet to dry til gets vac. Lupita Leashonna w [facility] aware of disch for nsg visits and vac changes. Lupita LeashDonna hopes [facility] will have vac to hook up at disch today.

## 2015-11-03 NOTE — Progress Notes (Signed)
Pt assisted to ER entrance for her ride to pick her up. Pt remains ambulatory. No signs and symptoms of distress or pain. Dondra SpryMoore, Mertis Mosher Islee, RN

## 2015-11-03 NOTE — Discharge Summary (Signed)
Physician Discharge Summary  [name] ZOX:[id number] DOB: August 16, 1970 DOA: 10/19/2015  PCP: Verlon Au, MD  Admit date: 10/19/2015 Discharge date: 11/03/2015  Time spent: > 35 minutes  Recommendations for Outpatient Follow-up:  1. Monitor S creatinine levels 2. Please ensure patient f/u with Nephrology 3. Pt has to follow up with General surgery as well after hospital discharge   Discharge Diagnoses:  Principal Problem:   Necrotizing soft tissue infection (HCC) Active Problems:   Diabetes mellitus (HCC)   Morbid obesity (HCC)   Cellulitis   ATN (acute tubular necrosis) (HCC)   Discharge Condition: stable  Diet recommendation: Carb modified diet  Filed Weights   10/30/15 2100 10/31/15 2202 11/01/15 2056  Weight: (!) 150.6 kg (332 lb) (!) 150.6 kg (332 lb 0.2 oz) (!) 150.6 kg (332 lb 0.2 oz)    History of present illness:  45 year old morbidly obese female with poorly controlled diabetes seen in the ED 3 times in the past 2 weeks for cellulitis of the perirectal area without abscess. She was sent home on Keflex but returned back to the ED stating worse.   Hospital Course:  Principal Problem:   Necrotizing soft tissue infection (HCC) Status post I&D. Cultures growing viridans strep and Prevotella. Antibiotics narrowed to Augmentin (Completed 2 wks of antibiotic treatment) Wound VAC per surgery.  Active Problems: Acute kidney injury Suspect ATN due to sepsis vs dehydration. Had received IV vancomycin upon admission and IV contrast on 9/8. Marland Kitchen So taking regular NSAIDs at home.  Renal ultrasound without urinary retention or hydronephrosis. Recommend patient f/u with Nephrology    Diabetes mellitus, uncontrolled (HCC) Stable on Lantus 25 units daily will stop metformin     Morbid obesity (HCC) Counseled on diet and exercise.   Procedures:  Wound vac  Consultations:  General surgery  Discharge Exam: Vitals:   11/03/15 0438 11/03/15 1009   BP: (!) 113/53 (!) 113/59  Pulse: 72 68  Resp: 16 18  Temp: 98.4 F (36.9 C) 97.9 F (36.6 C)    General: Pt in nad, alert and awake Cardiovascular: rrr, no rubs Respiratory: no increased wob, no wheezes  Discharge Instructions   Discharge Instructions    Call MD for:  redness, tenderness, or signs of infection (pain, swelling, redness, odor or green/yellow discharge around incision site)    Complete by:  As directed    Call MD for:  temperature >100.4    Complete by:  As directed    Diet - low sodium heart healthy    Complete by:  As directed    Discharge instructions    Complete by:  As directed    Please follow up with your nephrologist and general surgeon. Call their offices after hospital discharge to confirm appointment date and time.   Increase activity slowly    Complete by:  As directed      Current Discharge Medication List    CONTINUE these medications which have CHANGED   Details  Insulin Glargine (LANTUS SOLOSTAR) 100 UNIT/ML Solostar Pen Inject 25 Units into the skin at bedtime. Qty: 15 mL, Refills: 11    ondansetron (ZOFRAN) 4 MG tablet Take 1 tablet (4 mg total) by mouth every 8 (eight) hours as needed for nausea or vomiting.      CONTINUE these medications which have NOT CHANGED   Details  albuterol (VENTOLIN HFA) 108 (90 Base) MCG/ACT inhaler Inhale 2 puffs into the lungs every 6 (six) hours as needed for wheezing or shortness of breath.  buPROPion (WELLBUTRIN XL) 150 MG 24 hr tablet Take 150 mg by mouth daily.    insulin aspart (NOVOLOG) 100 UNIT/ML injection Inject 8 Units into the skin 3 (three) times daily with meals.     senna-docusate (SENOKOT-S) 8.6-50 MG tablet Take 4 tablets by mouth daily as needed for mild constipation.    sertraline (ZOLOFT) 25 MG tablet Take 25 mg by mouth daily.    traMADol (ULTRAM) 50 MG tablet Take 1 tablet (50 mg total) by mouth 2 (two) times daily as needed. Qty: 20 tablet, Refills: 0      STOP taking  these medications     cephALEXin (KEFLEX) 500 MG capsule      Fexofenadine HCl (ALLEGRA PO)      ibuprofen (ADVIL,MOTRIN) 200 MG tablet      ibuprofen (ADVIL,MOTRIN) 800 MG tablet      metFORMIN (GLUCOPHAGE) 500 MG tablet      metFORMIN (GLUCOPHAGE) 500 MG tablet      naproxen sodium (ANAPROX) 220 MG tablet      sulfamethoxazole-trimethoprim (BACTRIM DS,SEPTRA DS) 800-160 MG tablet      furosemide (LASIX) 20 MG tablet      insulin regular (NOVOLIN R) 100 units/mL injection        Allergies  Allergen Reactions  . Codeine Shortness Of Breath      The results of significant diagnostics from this hospitalization (including imaging, microbiology, ancillary and laboratory) are listed below for reference.    Significant Diagnostic Studies: Ct Abdomen Pelvis W Contrast  Result Date: 10/13/2015 CLINICAL DATA:  Possible perirectal abscess. Further evaluation requested. Initial encounter. EXAM: CT ABDOMEN AND PELVIS WITH CONTRAST TECHNIQUE: Multidetector CT imaging of the abdomen and pelvis was performed using the standard protocol following bolus administration of intravenous contrast. CONTRAST:  ISOVUE-300 IOPAMIDOL (ISOVUE-300) INJECTION 61% COMPARISON:  CT of the abdomen and pelvis from 06/21/2014 FINDINGS: Lower chest: The visualized lung bases are grossly clear. The visualized portions of the mediastinum are unremarkable. Hepatobiliary: The liver is unremarkable in appearance. Stones are noted dependently within the gallbladder. The gallbladder is otherwise unremarkable. The common bile duct remains normal in caliber. Pancreas: The pancreas is within normal limits. Spleen: The spleen is unremarkable in appearance. Adrenals/Urinary Tract: The adrenal glands are unremarkable in appearance. The kidneys are within normal limits. There is no evidence of hydronephrosis. No renal or ureteral stones are identified. No perinephric stranding is seen. Stomach/Bowel: The stomach is  unremarkable in appearance. The small bowel is within normal limits. The appendix is not visualized; there is no evidence for appendicitis. The colon is unremarkable in appearance. Vascular/Lymphatic: The abdominal aorta is unremarkable in appearance. The inferior vena cava is grossly unremarkable. No retroperitoneal lymphadenopathy is seen. No pelvic sidewall lymphadenopathy is identified. Reproductive: The bladder is decompressed and not well assessed. The uterus contains a large 8.6 cm fibroid, with minimal calcification. No suspicious adnexal masses are seen. The ovaries are grossly symmetric. There is mild apparent soft tissue prominence at the cervix and vagina, of uncertain significance. If the patient's symptoms persist, pelvic ultrasound would be helpful for further evaluation. The anorectal canal is unremarkable in appearance. There is no evidence of perirectal abscess. Other: Minimal nonspecific skin thickening is noted at the anterior left mid abdomen. Musculoskeletal: No acute osseous abnormalities are identified. The visualized musculature is unremarkable in appearance. IMPRESSION: 1. No acute abnormality seen to explain the patient's symptoms. No evidence of perirectal abscess. The anorectal canal is unremarkable in appearance. 2. Mild apparent soft tissue  prominence at the cervix and vagina, of uncertain significance. If the patient's symptoms persist, pelvic ultrasound would be helpful for further evaluation. 3. Cholelithiasis.  Gallbladder otherwise unremarkable. 4. Large 8.6 cm uterine fibroid noted. Electronically Signed   By: Roanna Raider M.D.   On: 10/13/2015 23:05   US Renal  Result Date: 10/21/2015 CLINICAL DATA:  Acute renal failure. EXAM: RENAL / URINARY TRACT ULTRASOUND COMPLETE COMPARISON:  None. FINDINGS: Right Kidney: Length: 16.6 cm. Echogenicity within normal limits. No mass or hydronephrosis visualized. Left Kidney: Length: 15.9 cm. Echogenicity within normal limits. No mass or  hydronephrosis visualized. Bladder: Appears normal for degree of bladder distention. IMPRESSION: 1. Relatively large kidneys which may be normal variant for this patient. 2. No other abnormality.  No hydronephrosis. Electronically Signed   By: Amie Portland M.D.   On: 10/21/2015 12:30   Ir Fluoro Guide Cv Line Right  Result Date: 10/27/2015 INDICATION: 45 year old female with a history of acute renal failure. EXAM: TUNNELED CENTRAL VENOUS HEMODIALYSIS CATHETER PLACEMENT WITH ULTRASOUND AND FLUOROSCOPIC GUIDANCE MEDICATIONS: None ANESTHESIA/SEDATION: None FLUOROSCOPY TIME:  Fluoroscopy Time: 0 minutes 6 seconds (19.4 mGy). COMPLICATIONS: None PROCEDURE: Informed written consent was obtained from the patient and the patient's family after a thorough discussion of the procedural risks, benefits and alternatives. All questions were addressed. A timeout was performed prior to the initiation of the procedure. The right neck and chest was prepped with chlorhexidine, and draped in the usual sterile fashion using maximum barrier technique (cap and mask, sterile gown, sterile gloves, large sterile sheet, hand hygiene and cutaneous antiseptic). Local anesthesia was attained by infiltration with 1% lidocaine without epinephrine. Ultrasound demonstrated patency of the right internal jugular vein, and this was documented with an image. Under real-time ultrasound guidance, this vein was accessed with a 21 gauge micropuncture needle and image documentation was performed. A small dermatotomy was made at the access site with an 11 scalpel. A 0.018" wire was advanced into the SVC and the access needle exchanged for a 62F micropuncture vascular sheath. The 0.018" wire was then removed and a 0.035" wire advanced into the IVC. Upon withdrawal of the 018 wire, the wire was marked for appropriate length of the internal portion of the catheter. A 20 cm catheter was selected. Skin and subcutaneous tissues were serially dilated. Catheter  was placed on the wire. The catheter tip is positioned in the upper right atrium. This was documented with a spot image. Both ports of the hemodialysis catheter were then tested for excellent function. The ports were then locked with heparinized lock. Patient tolerated the procedure well and remained hemodynamically stable throughout. No complications were encountered and no significant blood loss was encountered. IMPRESSION: Status post right IJ temporary HD catheter placement. Catheter ready for use. Signed, Yvone Neu. Loreta Ave, DO [facility] Electronically Signed   By: Gilmer Mor D.O.   On: 10/27/2015 13:38   Ir US Guide Vasc Access Right  Result Date: 10/27/2015 INDICATION: 45 year old female with a history of acute renal failure. EXAM: TUNNELED CENTRAL VENOUS HEMODIALYSIS CATHETER PLACEMENT WITH ULTRASOUND AND FLUOROSCOPIC GUIDANCE MEDICATIONS: None ANESTHESIA/SEDATION: None FLUOROSCOPY TIME:  Fluoroscopy Time: 0 minutes 6 seconds (19.4 mGy). COMPLICATIONS: None PROCEDURE: Informed written consent was obtained from the patient and the patient's family after a thorough discussion of the procedural risks, benefits and alternatives. All questions were addressed. A timeout was performed prior to the initiation of the procedure. The right neck and chest was prepped with chlorhexidine, and draped  in the usual sterile fashion using maximum barrier technique (cap and mask, sterile gown, sterile gloves, large sterile sheet, hand hygiene and cutaneous antiseptic). Local anesthesia was attained by infiltration with 1% lidocaine without epinephrine. Ultrasound demonstrated patency of the right internal jugular vein, and this was documented with an image. Under real-time ultrasound guidance, this vein was accessed with a 21 gauge micropuncture needle and image documentation was performed. A small dermatotomy was made at the access site with an 11 scalpel. A  0.018" wire was advanced into the SVC and the access needle exchanged for a 66F micropuncture vascular sheath. The 0.018" wire was then removed and a 0.035" wire advanced into the IVC. Upon withdrawal of the 018 wire, the wire was marked for appropriate length of the internal portion of the catheter. A 20 cm catheter was selected. Skin and subcutaneous tissues were serially dilated. Catheter was placed on the wire. The catheter tip is positioned in the upper right atrium. This was documented with a spot image. Both ports of the hemodialysis catheter were then tested for excellent function. The ports were then locked with heparinized lock. Patient tolerated the procedure well and remained hemodynamically stable throughout. No complications were encountered and no significant blood loss was encountered. IMPRESSION: Status post right IJ temporary HD catheter placement. Catheter ready for use. Signed, Yvone NeuJaime S. Loreta AveWagner, DO [facility] Electronically Signed   By: Gilmer MorJaime  Wagner D.O.   On: 10/27/2015 13:38    Microbiology: No results found for this or any previous visit (from the past 240 hour(s)).   Labs: Basic Metabolic Panel:  Recent Labs Lab 10/30/15 0612 10/31/15 0552 11/01/15 0518 11/02/15 0231 11/03/15 0500  NA 139 138 138 138 139  K 3.6 3.9 3.8 3.6 3.6  CL 105 105 103 104 104  CO2 24 22 27 25 26   GLUCOSE 103* 178* 154* 107* 120*  BUN 15 18 17 17 17   CREATININE 7.00* 7.33* 7.22* 6.66* 5.63*  CALCIUM 8.3* 8.7* 8.9 8.9 9.1  PHOS  --  5.5* 5.7* 4.9* 4.8*   Liver Function Tests:  Recent Labs Lab 10/31/15 0552 11/01/15 0518 11/02/15 0231 11/03/15 0500  ALBUMIN 2.1* 2.1* 2.2* 2.3*   No results for input(s): LIPASE, AMYLASE in the last 168 hours. No results for input(s): AMMONIA in the last 168 hours. CBC:  Recent Labs Lab 10/28/15 0330  WBC 11.4*  HGB 8.7*  HCT 25.3*  MCV 83.2  PLT 214   Cardiac Enzymes: No results  for input(s): CKTOTAL, CKMB, CKMBINDEX, TROPONINI in the last 168 hours. BNP: BNP (last 3 results)  Recent Labs  03/16/15 1145  BNP 69.1    ProBNP (last 3 results) No results for input(s): PROBNP in the last 8760 hours.  CBG:  Recent Labs Lab 11/02/15 1209 11/02/15 1710 11/02/15 2149 11/03/15 0726 11/03/15 1129  GLUCAP 135* 118* 154* 121* 95   Signed:  Penny PiaVEGA, Madeline Kovacich MD.  [facility] 11/03/2015, 3:43 PM

## 2015-11-03 NOTE — Progress Notes (Signed)
Subjective: Interval History: has no complaint .  Objective: Vital signs in last 24 hours: Temp:  [98.4 F (36.9 C)-98.6 F (37 C)] 98.4 F (36.9 C) (09/29 0438) Pulse Rate:  [65-72] 72 (09/29 0438) Resp:  [16-18] 16 (09/29 0438) BP: (113-166)/(53-74) 113/53 (09/29 0438) SpO2:  [98 %-99 %] 98 % (09/29 0438) Weight change:   Intake/Output from previous day: 09/28 0701 - 09/29 0700 In: 942 [P.O.:942] Out: 0  Intake/Output this shift: No intake/output data recorded.  General appearance: alert, cooperative and morbidly obese Neck: RIJ cath Resp: diminished breath sounds bilaterally Cardio: S1, S2 normal and 2nd systolic murmur: holosystolic 2/6, blowing at apex GI: obese, massive,pos bs, soft Extremities: extremities normal, atraumatic, no cyanosis or edema  Lab Results: No results for input(s): WBC, HGB, HCT, PLT in the last 72 hours. BMET:  Recent Labs  11/02/15 0231 11/03/15 0500  NA 138 139  K 3.6 3.6  CL 104 104  CO2 25 26  GLUCOSE 107* 120*  BUN 17 17  CREATININE 6.66* 5.63*  CALCIUM 8.9 9.1   No results for input(s): PTH in the last 72 hours. Iron Studies: No results for input(s): IRON, TIBC, TRANSFERRIN, FERRITIN in the last 72 hours.  Studies/Results: No results found.  I have reviewed the patient's current medications.  Assessment/Plan: 1 AKI improving . Vol , acid/base/K.  Can d/c IJ cath.   2 Obesity 3 DM 4 Sacral abscess Vac 5 HTN P d/c cath, cont vac, stop phoslo,  Can D/C anytime and have labs f/u outpatient    LOS: 15 days   [name] L 11/03/2015,9:33 AM

## 2015-11-03 NOTE — Progress Notes (Signed)
Dc instructions given to pt at this time.  Pt verbalized understanding of all instructions.  HH nurse delivered wound vac.  Will change wv dressing at this time.  Next dressing change will be due Monday 10/2.  Pt has no s/s of any acute distress or c/o pain.

## 2016-01-28 ENCOUNTER — Emergency Department (HOSPITAL_COMMUNITY)
Admission: EM | Admit: 2016-01-28 | Discharge: 2016-01-28 | Disposition: A | Discharge status: Home / Self Care | Payer: Commercial Managed Care - PPO | Attending: Emergency Medicine | Admitting: Emergency Medicine

## 2016-01-28 ENCOUNTER — Encounter (HOSPITAL_COMMUNITY): Payer: Self-pay

## 2016-01-28 ENCOUNTER — Emergency Department (HOSPITAL_COMMUNITY): Payer: Commercial Managed Care - PPO

## 2016-01-28 DIAGNOSIS — M1711 Unilateral primary osteoarthritis, right knee: Secondary | ICD-10-CM | POA: Diagnosis not present

## 2016-01-28 DIAGNOSIS — E119 Type 2 diabetes mellitus without complications: Secondary | ICD-10-CM | POA: Diagnosis not present

## 2016-01-28 DIAGNOSIS — Z794 Long term (current) use of insulin: Secondary | ICD-10-CM | POA: Diagnosis not present

## 2016-01-28 DIAGNOSIS — M25561 Pain in right knee: Secondary | ICD-10-CM | POA: Diagnosis present

## 2016-01-28 HISTORY — DX: Acute kidney failure, unspecified: N17.9

## 2016-01-28 MED ORDER — TRAMADOL HCL 50 MG PO TABS: 50.0000 mg | ORAL_TABLET | Freq: Two times a day (BID) | ORAL | 0 refills | Status: DC | PRN

## 2016-01-28 NOTE — ED Notes (Signed)
Patient transported to X-ray 

## 2016-01-28 NOTE — ED Provider Notes (Signed)
[facility]-EMERGENCY DEPT Provider Note   CSN: [id number] Arrival date & time: 01/28/16  0831     History   Chief Complaint Chief Complaint  Patient presents with  . Knee Pain    HPI [name] is a 45 y.o. female.  HPI   45 year old morbidly obese female with history of insulin-dependent diabetes presenting complaining of right knee pain. Patient reports she injured her right knee many years ago and occasionally has pain to the affected knee. She was out of work for 2 months after she injured her tailbone and require surgery. She recently returned back to work 2 weeks ago and since being at work, she has been having increasing right knee pain. She described as a throbbing sensation nonradiating, worsening with prolonged standing or with bending and currently rated as 7 out of 10. No associated hip or ankle pain. No new injury. She has been using over-the-counter medication including Aspercreme, Tylenol, ibuprofen, without adequate improvement. She does not have an orthopedist. No associated fever, or rash.  Past Medical History:  Diagnosis Date  . Acute renal failure (ARF) (HCC)   . Allergy   . Depression   . Diabetes mellitus   . Morbid obesity [facility](HCC)     Patient Active Problem List   Diagnosis Date Noted  . ATN (acute tubular necrosis) (HCC) 10/31/2015  . Cellulitis 10/19/2015  . Necrotizing soft tissue infection (HCC) 10/19/2015  . Acute sinus infection 07/07/2013  . Gall stones 07/07/2013  . Osteoarthritis 03/09/2013  . Transaminitis 01/26/2013  . Depression 01/26/2013  . DM type 2 goal A1C below 7.5 01/26/2013  . Diabetes mellitus (HCC) 07/15/2012  . Disorder of bone and cartilage 07/15/2012  . Morbid obesity (HCC) 07/15/2012  . Other malaise and fatigue 07/15/2012    Past Surgical History:  Procedure Laterality Date  . INCISION AND DRAINAGE ABSCESS N/A 10/21/2015   Procedure: dressing change to  PRESACRAL ABCESS;  Surgeon: Romie LeveeAlicia Thomas, MD;  Location: [facility]  ORS;  Service: General;  Laterality: N/A;  dressing to sacral area  . IR GENERIC HISTORICAL  10/27/2015   IR FLUORO GUIDE CV LINE RIGHT 10/27/2015 Gilmer MorJaime Wagner, DO [facility]-INTERV RAD  . IR GENERIC HISTORICAL  10/27/2015   IR US GUIDE VASC ACCESS RIGHT 10/27/2015 Gilmer MorJaime Wagner, DO [facility]INTER[facility]  . IRRIGATION AND DEBRIDEMENT ABSCESS N/A 10/19/2015   Procedure: IRRIGATION AND DEBRIDEMENT PRESACRAL ABSCESS;  Surgeon: Claud KelpHaywood Ingram, MD;  Location: [facility] ORS;  Service: General;  Laterality: N/A;  . TUBAL LIGATION      OB History    No data available       Home Medications    Prior to Admission medications   Medication Sig Start Date End Date Taking? Authorizing Provider  albuterol (VENTOLIN HFA) 108 (90 Base) MCG/ACT inhaler Inhale 2 puffs into the lungs every 6 (six) hours as needed for wheezing or shortness of breath.  06/15/15 06/14/16  Historical Provider, MD  buPROPion (WELLBUTRIN XL) 150 MG 24 hr tablet Take 150 mg by mouth daily. 06/15/15 06/14/16  Historical Provider, MD  insulin aspart (NOVOLOG) 100 UNIT/ML injection Inject 8 Units into the skin 3 (three) times daily with meals.  10/02/15   Historical Provider, MD  Insulin Glargine (LANTUS SOLOSTAR) 100 UNIT/ML Solostar Pen Inject 25 Units into the skin at bedtime. 11/03/15   Penny Piarlando Vega, MD  ondansetron (ZOFRAN) 4 MG tablet Take 1 tablet (4 mg total) by mouth every 8 (eight) hours as needed for nausea or vomiting. 11/03/15   Penny Piarlando Vega, MD  senna-docusate (SENOKOT-S) 8.6-50 MG tablet Take 4 tablets by mouth daily as needed for mild constipation.    Historical Provider, MD  sertraline (ZOLOFT) 25 MG tablet Take 25 mg by mouth daily. 06/15/15 06/14/16  Historical Provider, MD  traMADol (ULTRAM) 50 MG tablet Take 1 tablet (50 mg total) by mouth 2 (two) times daily as needed. Patient taking differently: Take 50 mg by mouth 2 (two) times daily as needed for moderate pain.  10/17/15   Donnetta HutchingBrian Cook, MD    Family History Family History  Problem Relation Age  of Onset  . Diabetes Mother   . Lymphoma Father   . Lymphoma Brother     Social History Social History  Substance Use Topics  . Smoking status: Never Smoker  . Smokeless tobacco: Never Used  . Alcohol use No     Allergies   Codeine   Review of Systems Review of Systems  Constitutional: Negative for fever.  Musculoskeletal: Positive for arthralgias.  Skin: Negative for rash and wound.  Neurological: Negative for numbness.     Physical Exam Updated Vital Signs BP 128/71 (BP Location: Left Arm)   Pulse 70   Temp 97.5 F (36.4 C) (Oral)   Resp 20   Ht 5\' 6"  (1.676 m)   Wt (!) 140.6 kg   LMP 01/27/2016   SpO2 99%   BMI 50.04 kg/m   Physical Exam  Constitutional: She appears well-developed and well-nourished. No distress.  HENT:  Head: Atraumatic.  Eyes: Conjunctivae are normal.  Neck: Neck supple.  Musculoskeletal: She exhibits tenderness (Right knee: Tenderness noted to the anterior knee on palpation with mild effusion noted. No crepitus or deformity. No joint laxity. No overlying skin changes. Increasing pain with knee flexion.).  Right hip and right ankle are nontender.  Neurological: She is alert.  Skin: No rash noted.  Psychiatric: She has a normal mood and affect.  Nursing note and vitals reviewed.    ED Treatments / Results  Labs (all labs ordered are listed, but only abnormal results are displayed) Labs Reviewed - No data to display  EKG  EKG Interpretation None       Radiology Dg Knee Complete 4 Views Right  Result Date: 01/28/2016 CLINICAL DATA:  Knee pain for 2 weeks.  Injury 2 years ago. EXAM: RIGHT KNEE - COMPLETE 4+ VIEW COMPARISON:  None. FINDINGS: No acute fracture. No dislocation. Unremarkable soft tissues. Tricompartment osteoarthritic change. IMPRESSION: No acute bony pathology. Electronically Signed   By: Jolaine ClickArthur  Hoss M.D.   On: 01/28/2016 09:28    Procedures Procedures (including critical care time)  Medications Ordered in  ED Medications - No data to display   Initial Impression / Assessment and Plan / ED Course  I have reviewed the triage vital signs and the nursing notes.  Pertinent labs & imaging results that were available during my care of the patient were reviewed by me and considered in my medical decision making (see chart for details).  Clinical Course     BP 128/71 (BP Location: Left Arm)   Pulse 70   Temp 97.5 F (36.4 C) (Oral)   Resp 20   Ht 5\' 6"  (1.676 m)   Wt (!) 140.6 kg   LMP 01/27/2016   SpO2 99%   BMI 50.04 kg/m    Final Clinical Impressions(s) / ED Diagnoses   Final diagnoses:  Osteoarthritis of right knee, unspecified osteoarthritis type    New Prescriptions Current Discharge Medication List     9:35 AM  This is an obese lady here with worsening chronic right knee pain. Pain is suggestive of osteoarthritis and less likely to be septic joint or fracture. X-ray obtained  9:55 AM Xray unremarkable.  RICE therapy discussed.  ACE wrap applied.  Ortho referral given as needed.    Fayrene Helper, PA-C 01/28/16 0957    Lorre Nick, MD 01/28/16 [phone]

## 2016-01-28 NOTE — ED Triage Notes (Signed)
Pt c/o R knee pain x 2 weeks.  Pain score 8/10.  Pt reports taking tylenol w/o relief.  Denies new injury.  Sts she injured it "years ago."  Sts she was off work for two months and just started back.    Pt ambulated to room w/o difficulty.

## 2016-01-28 NOTE — Discharge Instructions (Signed)
If your pain persists for longer than a month, then follow up with orthopedist for further care.

## 2016-03-19 ENCOUNTER — Other Ambulatory Visit: Payer: Self-pay | Admitting: Family Medicine

## 2016-03-19 DIAGNOSIS — Z1231 Encounter for screening mammogram for malignant neoplasm of breast: Secondary | ICD-10-CM

## 2016-05-21 ENCOUNTER — Ambulatory Visit
Admission: RE | Admit: 2016-05-21 | Discharge: 2016-05-21 | Disposition: A | Discharge status: Home / Self Care | Payer: Commercial Managed Care - PPO | Source: Ambulatory Visit | Attending: Family Medicine | Admitting: Family Medicine

## 2016-05-21 ENCOUNTER — Other Ambulatory Visit: Payer: Self-pay | Admitting: Nurse Practitioner

## 2016-05-21 ENCOUNTER — Other Ambulatory Visit (HOSPITAL_COMMUNITY)
Admission: RE | Admit: 2016-05-21 | Discharge: 2016-05-21 | Disposition: A | Discharge status: Home / Self Care | Payer: Commercial Managed Care - PPO | Source: Ambulatory Visit | Attending: Nurse Practitioner | Admitting: Nurse Practitioner

## 2016-05-21 DIAGNOSIS — Z01419 Encounter for gynecological examination (general) (routine) without abnormal findings: Secondary | ICD-10-CM | POA: Diagnosis present

## 2016-05-21 DIAGNOSIS — Z1231 Encounter for screening mammogram for malignant neoplasm of breast: Secondary | ICD-10-CM

## 2016-05-21 DIAGNOSIS — Z1151 Encounter for screening for human papillomavirus (HPV): Secondary | ICD-10-CM | POA: Insufficient documentation

## 2016-05-23 LAB — CYTOLOGY - PAP
Adequacy: ABSENT
DIAGNOSIS: NEGATIVE
HPV: NOT DETECTED

## 2016-05-31 ENCOUNTER — Emergency Department (HOSPITAL_COMMUNITY)
Admission: EM | Admit: 2016-05-31 | Discharge: 2016-05-31 | Disposition: A | Discharge status: Home / Self Care | Payer: Commercial Managed Care - PPO | Attending: Emergency Medicine | Admitting: Emergency Medicine

## 2016-05-31 ENCOUNTER — Encounter (HOSPITAL_COMMUNITY): Payer: Self-pay

## 2016-05-31 DIAGNOSIS — Z79899 Other long term (current) drug therapy: Secondary | ICD-10-CM | POA: Diagnosis not present

## 2016-05-31 DIAGNOSIS — Z794 Long term (current) use of insulin: Secondary | ICD-10-CM | POA: Diagnosis not present

## 2016-05-31 DIAGNOSIS — L02419 Cutaneous abscess of limb, unspecified: Secondary | ICD-10-CM

## 2016-05-31 DIAGNOSIS — L02412 Cutaneous abscess of left axilla: Secondary | ICD-10-CM | POA: Insufficient documentation

## 2016-05-31 DIAGNOSIS — E119 Type 2 diabetes mellitus without complications: Secondary | ICD-10-CM | POA: Insufficient documentation

## 2016-05-31 MED ORDER — CEPHALEXIN 500 MG PO CAPS: 500.0000 mg | ORAL_CAPSULE | Freq: Four times a day (QID) | ORAL | 0 refills | Status: DC

## 2016-05-31 MED ORDER — FLUCONAZOLE 150 MG PO TABS: 150.0000 mg | ORAL_TABLET | Freq: Once | ORAL | 0 refills | Status: AC

## 2016-05-31 MED ORDER — LIDOCAINE HCL (PF) 1 % IJ SOLN
5.0000 mL | Freq: Once | INTRAMUSCULAR | Status: DC
  Filled 2016-05-31: qty 30

## 2016-05-31 MED ORDER — LIDOCAINE HCL (PF) 1 % IJ SOLN
5.0000 mL | Freq: Once | INTRAMUSCULAR | Status: AC
Start: 2016-05-31 — End: 2016-05-31
  Administered 2016-05-31: 5 mL via INTRADERMAL
  Filled 2016-05-31: qty 30

## 2016-05-31 MED ORDER — SODIUM BICARBONATE 4 % IV SOLN
5.0000 mL | Freq: Once | INTRAVENOUS | Status: AC
  Administered 2016-05-31: 5 mL via INTRAVENOUS
  Filled 2016-05-31: qty 5

## 2016-05-31 NOTE — Discharge Instructions (Signed)
Contact a health care provider if: °Your cyst or abscess returns. °You have a fever. °You have more redness, swelling, or pain around your incision. °You have more fluid or blood coming from your incision. °Your incision feels warm to the touch. °You have pus or a bad smell coming from your incision. °Get help right away if: °You have severe pain or bleeding. °You cannot eat or drink without vomiting. °You have decreased urine output. °You become short of breath. °You have chest pain. °You cough up blood. °The area where the incision and drainage occurred becomes numb or it tingles °

## 2016-05-31 NOTE — ED Triage Notes (Signed)
PT HERE FOR AN ABSCESS TO THE LEFT AXILLA X1 WEEK. PT STS TODAY THE SKIN BROKE, BUT THERE WAS NO DRAINAGE. DENIES FEVER.

## 2016-05-31 NOTE — ED Provider Notes (Signed)
[facility]-EMERGENCY DEPT Provider Note   CSN: [id number] Arrival date & time: 05/31/16  2058  By signing my name below, I, Diona Browner, attest that this documentation has been prepared under the direction and in the presence of Arthor Captain, PA-C. Electronically Signed: Diona Browner, ED Scribe. 05/31/16. 9:40 PM.   History   Chief Complaint Chief Complaint  Patient presents with  . Abscess    HPI [name] is a 46 y.o. female who presents to the Emergency Department complaining of a gradually worsening lump under her left axilla that started ~ 3-4 days ago. Pt reports pain on and around the lump, and notes a white head. Treatments tried include keeping the area clean with alcohol and peroxide and using warm compresses with mild to no relief. H/o of abscesses. In September 2017, pt had to have an abscess surgically drained on her tailbone. Allergies to codeine and CT dye. Pt denies fever, chills and drainage.  The history is provided by the patient. No language interpreter was used.    Past Medical History:  Diagnosis Date  . Acute renal failure (ARF) (HCC)   . Allergy   . Depression   . Diabetes mellitus   . Morbid obesity [facility])     Patient Active Problem List   Diagnosis Date Noted  . ATN (acute tubular necrosis) (HCC) 10/31/2015  . Cellulitis 10/19/2015  . Necrotizing soft tissue infection 10/19/2015  . Acute sinus infection 07/07/2013  . Gall stones 07/07/2013  . Osteoarthritis 03/09/2013  . Transaminitis 01/26/2013  . Depression 01/26/2013  . DM type 2 goal A1C below 7.5 01/26/2013  . Diabetes mellitus (HCC) 07/15/2012  . Disorder of bone and cartilage 07/15/2012  . Morbid obesity (HCC) 07/15/2012  . Other malaise and fatigue 07/15/2012    Past Surgical History:  Procedure Laterality Date  . INCISION AND DRAINAGE ABSCESS N/A 10/21/2015   Procedure: dressing change to  PRESACRAL ABCESS;  Surgeon: Romie Levee, MD;  Location: [facility] ORS;  Service: General;   Laterality: N/A;  dressing to sacral area  . IR GENERIC HISTORICAL  10/27/2015   IR FLUORO GUIDE CV LINE RIGHT 10/27/2015 Gilmer Mor, DO [facility]-INTERV RAD  . IR GENERIC HISTORICAL  10/27/2015   IR US GUIDE VASC ACCESS RIGHT 10/27/2015 Gilmer Mor, DO [facility]  . IRRIGATION AND DEBRIDEMENT ABSCESS N/A 10/19/2015   Procedure: IRRIGATION AND DEBRIDEMENT PRESACRAL ABSCESS;  Surgeon: Claud Kelp, MD;  Location: [facility] ORS;  Service: General;  Laterality: N/A;  . TUBAL LIGATION      OB History    No data available       Home Medications    Prior to Admission medications   Medication Sig Start Date End Date Taking? Authorizing Provider  acetaminophen (TYLENOL) 650 MG CR tablet Take 650-1,300 mg by mouth every 8 (eight) hours as needed for pain.    Historical Provider, MD  albuterol (VENTOLIN HFA) 108 (90 Base) MCG/ACT inhaler Inhale 2 puffs into the lungs every 6 (six) hours as needed for wheezing or shortness of breath.  06/15/15 06/14/16  Historical Provider, MD  insulin aspart (NOVOLOG) 100 UNIT/ML injection Inject 5-10 Units into the skin 3 (three) times daily with meals as needed for high blood sugar (per sliding scale).  10/02/15   Historical Provider, MD  Insulin Glargine (LANTUS SOLOSTAR) 100 UNIT/ML Solostar Pen Inject 25 Units into the skin at bedtime. Patient taking differently: Inject 20 Units into the skin at bedtime.  11/03/15   Penny Pia, MD  ondansetron [facility])  4 MG tablet Take 1 tablet (4 mg total) by mouth every 8 (eight) hours as needed for nausea or vomiting. Patient not taking: Reported on 01/28/2016 11/03/15   Penny Pia, MD  traMADol (ULTRAM) 50 MG tablet Take 1 tablet (50 mg total) by mouth 2 (two) times daily as needed for moderate pain or severe pain. 01/28/16   Fayrene Helper, PA-C    Family History Family History  Problem Relation Age of Onset  . Diabetes Mother   . Lymphoma Father   . Lymphoma Brother     Social History Social History  Substance Use Topics    . Smoking status: Never Smoker  . Smokeless tobacco: Never Used  . Alcohol use No     Allergies   Codeine and Contrast media [iodinated diagnostic agents]   Review of Systems Review of Systems  A complete 10 system review of systems was obtained and all systems are negative except as noted in the HPI and PMH.   Physical Exam Updated Vital Signs BP (!) 155/95 (BP Location: Right Arm)   Pulse 72   Temp 98.5 F (36.9 C) (Oral)   Resp 18   Ht  (1.727 m)   Wt (!) 328 lb (148.8 kg)   LMP 05/28/2016   SpO2 98%   BMI 49.87 kg/m   Physical Exam  Constitutional: She appears well-developed and well-nourished. No distress.  HENT:  Head: Normocephalic and atraumatic.  Eyes: Conjunctivae are normal.  Neck: Normal range of motion.  Cardiovascular: Normal rate.   Pulmonary/Chest: Effort normal.  Abdominal: She exhibits no distension.  Musculoskeletal: Normal range of motion.  Neurological: She is alert.  Skin: No pallor.  Psychiatric: She has a normal mood and affect. Her behavior is normal.  Nursing note and vitals reviewed.    ED Treatments / Results  DIAGNOSTIC STUDIES: Oxygen Saturation is 98% on RA, normal by my interpretation.   COORDINATION OF CARE: 9:40 PM-Discussed next steps with pt. Pt verbalized understanding and is agreeable with the plan.    Labs (all labs ordered are listed, but only abnormal results are displayed) Labs Reviewed - No data to display  EKG  EKG Interpretation None       Radiology No results found.  Procedures Procedures (including critical care time)  Medications Ordered in ED Medications  lidocaine (PF) (XYLOCAINE) 1 % injection 5 mL (not administered)  lidocaine (PF) (XYLOCAINE) 1 % injection 5 mL (not administered)  sodium bicarbonate (NEUT) 4 % injection 5 mL (not administered)   INCISION AND DRAINAGE Performed by: Arthor Captain Consent: Verbal consent obtained. Risks and benefits: risks, benefits and  alternatives were discussed Type: abscess  Body area: L axilla  Anesthesia: local infiltration  Incision was made with a scalpel.  Local anesthetic: lidocaine 1% w/o epinephrine + bicarb  Anesthetic total: 5 ml  Complexity: complex Blunt dissection to break up loculations  Drainage: purulent  Drainage amount: moderate    Patient tolerance: Patient tolerated the procedure well with no immediate complications.     Initial Impression / Assessment and Plan / ED Course  I have reviewed the triage vital signs and the nursing notes.  Pertinent labs & imaging results that were available during my care of the patient were reviewed by me and considered in my medical decision making (see chart for details).    Patient with skin abscess amenable to incision and drainage.  Abscess was not large enough to warrant packing or drain placement,  wound recheck in 2 days.  Encouraged home warm soaks and flushing.  Mild signs of cellulitis is surrounding skin.  Will d/c to home.  No antibiotic therapy is indicated.    Final Clinical Impressions(s) / ED Diagnoses   Final diagnoses:  Axillary abscess    New Prescriptions New Prescriptions   No medications on file   I personally performed the services described in this documentation, which was scribed in my presence. The recorded information has been reviewed and is accurate.       Arthor Captain, PA-C 05/31/16 2209    Rolan Bucco, MD 05/31/16 2352

## 2016-05-31 NOTE — ED Notes (Signed)
PT DISCHARGED. INSTRUCTIONS AND PRESCRIPTIONS GIVEN. AAOX4. PT IN NO APPARENT DISTRESS OR PAIN. THE OPPORTUNITY TO ASK QUESTIONS WAS PROVIDED. 

## 2016-11-08 ENCOUNTER — Other Ambulatory Visit: Payer: Self-pay | Admitting: Student

## 2016-11-08 ENCOUNTER — Ambulatory Visit
Admission: RE | Admit: 2016-11-08 | Discharge: 2016-11-08 | Disposition: A | Discharge status: Home / Self Care | Payer: PRIVATE HEALTH INSURANCE | Source: Ambulatory Visit | Attending: Student | Admitting: Student

## 2016-11-08 DIAGNOSIS — G8918 Other acute postprocedural pain: Secondary | ICD-10-CM

## 2016-11-18 ENCOUNTER — Other Ambulatory Visit (HOSPITAL_COMMUNITY): Payer: Self-pay | Admitting: Student

## 2016-11-18 DIAGNOSIS — G8918 Other acute postprocedural pain: Secondary | ICD-10-CM

## 2016-11-27 ENCOUNTER — Other Ambulatory Visit: Payer: Self-pay | Admitting: General Surgery

## 2016-11-28 ENCOUNTER — Other Ambulatory Visit: Payer: Self-pay | Admitting: Radiology

## 2016-11-29 ENCOUNTER — Ambulatory Visit (HOSPITAL_COMMUNITY)
Admission: RE | Admit: 2016-11-29 | Discharge: 2016-11-29 | Disposition: A | Discharge status: Home / Self Care | Payer: PRIVATE HEALTH INSURANCE | Source: Ambulatory Visit | Attending: Student | Admitting: Student

## 2016-11-29 ENCOUNTER — Other Ambulatory Visit (HOSPITAL_COMMUNITY): Payer: Self-pay | Admitting: Student

## 2016-11-29 ENCOUNTER — Encounter (HOSPITAL_COMMUNITY): Payer: Self-pay

## 2016-11-29 DIAGNOSIS — N179 Acute kidney failure, unspecified: Secondary | ICD-10-CM | POA: Insufficient documentation

## 2016-11-29 DIAGNOSIS — G8918 Other acute postprocedural pain: Secondary | ICD-10-CM | POA: Diagnosis present

## 2016-11-29 DIAGNOSIS — Z9889 Other specified postprocedural states: Secondary | ICD-10-CM | POA: Insufficient documentation

## 2016-11-29 DIAGNOSIS — Z833 Family history of diabetes mellitus: Secondary | ICD-10-CM | POA: Insufficient documentation

## 2016-11-29 DIAGNOSIS — Z807 Family history of other malignant neoplasms of lymphoid, hematopoietic and related tissues: Secondary | ICD-10-CM | POA: Diagnosis not present

## 2016-11-29 DIAGNOSIS — Z6841 Body Mass Index (BMI) 40.0 and over, adult: Secondary | ICD-10-CM | POA: Diagnosis not present

## 2016-11-29 DIAGNOSIS — Z3202 Encounter for pregnancy test, result negative: Secondary | ICD-10-CM | POA: Diagnosis not present

## 2016-11-29 DIAGNOSIS — Z794 Long term (current) use of insulin: Secondary | ICD-10-CM | POA: Insufficient documentation

## 2016-11-29 DIAGNOSIS — L0231 Cutaneous abscess of buttock: Secondary | ICD-10-CM | POA: Diagnosis not present

## 2016-11-29 DIAGNOSIS — Z9851 Tubal ligation status: Secondary | ICD-10-CM | POA: Diagnosis not present

## 2016-11-29 DIAGNOSIS — M799 Soft tissue disorder, unspecified: Secondary | ICD-10-CM | POA: Diagnosis not present

## 2016-11-29 DIAGNOSIS — E119 Type 2 diabetes mellitus without complications: Secondary | ICD-10-CM | POA: Diagnosis not present

## 2016-11-29 LAB — CBC
HCT: 31.9 % — ABNORMAL LOW (ref 36.0–46.0)
Hemoglobin: 11 g/dL — ABNORMAL LOW (ref 12.0–15.0)
MCH: 28.8 pg (ref 26.0–34.0)
MCHC: 34.5 g/dL (ref 30.0–36.0)
MCV: 83.5 fL (ref 78.0–100.0)
PLATELETS: 189 10*3/uL (ref 150–400)
RBC: 3.82 MIL/uL — AB (ref 3.87–5.11)
RDW: 13.3 % (ref 11.5–15.5)
WBC: 3.7 10*3/uL — ABNORMAL LOW (ref 4.0–10.5)

## 2016-11-29 LAB — PROTIME-INR
INR: 0.97
Prothrombin Time: 12.8 seconds (ref 11.4–15.2)

## 2016-11-29 LAB — GLUCOSE, CAPILLARY
GLUCOSE-CAPILLARY: 167 mg/dL — AB (ref 65–99)
GLUCOSE-CAPILLARY: 137 mg/dL — AB (ref 65–99)

## 2016-11-29 LAB — PREGNANCY, URINE: Preg Test, Ur: NEGATIVE

## 2016-11-29 MED ORDER — FENTANYL CITRATE (PF) 100 MCG/2ML IJ SOLN
INTRAMUSCULAR | Status: AC
  Filled 2016-11-29: qty 2

## 2016-11-29 MED ORDER — FENTANYL CITRATE (PF) 100 MCG/2ML IJ SOLN
INTRAMUSCULAR | Status: AC | PRN
  Administered 2016-11-29 (×2): 50 ug via INTRAVENOUS

## 2016-11-29 MED ORDER — MIDAZOLAM HCL 2 MG/2ML IJ SOLN
INTRAMUSCULAR | Status: AC
  Filled 2016-11-29: qty 2

## 2016-11-29 MED ORDER — SODIUM CHLORIDE 0.9 % IV SOLN: INTRAVENOUS | Status: DC

## 2016-11-29 MED ORDER — MIDAZOLAM HCL 2 MG/2ML IJ SOLN
INTRAMUSCULAR | Status: AC | PRN
  Administered 2016-11-29: 2 mg via INTRAVENOUS

## 2016-11-29 MED ORDER — LIDOCAINE HCL 1 % IJ SOLN
INTRAMUSCULAR | Status: AC
  Filled 2016-11-29: qty 20

## 2016-11-29 NOTE — H&P (Signed)
Chief Complaint: Patient was seen in consultation today for pre sacral collection aspiration/biopsy at the request of Gosai,Puja  Referring Physician(s): Gosai,Puja Dr Edythe Lynn  Supervising Physician: Oley Balm  Patient Status: [facility] - Out-pt  History of Present Illness: [name] is a 46 y.o. female   Pt developed Rt buttock abscess 10/2015 Started with pain and continued Antibiotic treatment without relief Abscess irrigation and debridement 10/18/2015--Dr Derrell Lolling Has done well until last few weeks Noted pain again- but "different " than before  CT 10/5: IMPRESSION: Indeterminate soft tissue or fluid collection in the posterior subcutaneous tissues. This area could be better characterized with ultrasound or post-contrast CT. Band of soft tissue thickening that extends to the skin surface suggests there may have been a cutaneous fistula or postsurgical changes at this area.  [place] today has been reviewed with Dr Deanne Coffer--- He feels pt is candidate for aspiration/biopsy Scheduled now for same   Past Medical History:  Diagnosis Date  . Acute renal failure (ARF) (HCC)   . Allergy   . Depression   . Diabetes mellitus   . Morbid obesity (HCC)     Past Surgical History:  Procedure Laterality Date  . INCISION AND DRAINAGE ABSCESS N/A 10/21/2015   Procedure: dressing change to  PRESACRAL ABCESS;  Surgeon: Romie Levee, MD;  Location: [facility]S;  Service: General;  Laterality: N/A;  dressing to sacral area  . IR GENERIC HISTORICAL  10/27/2015   IR FLUORO GUIDE CV LINE RIGHT 10/27/2015 Gilmer Mor, DO [facility]INTER[facility]  . IR GENERIC HISTORICAL  10/27/2015   IR US GUIDE VASC ACCESS RIGHT 10/27/2015 Gilmer Mor, DO [facility]  . IRRIGATION AND DEBRIDEMENT ABSCESS N/A 10/19/2015   Procedure: IRRIGATION AND DEBRIDEMENT PRESACRAL ABSCESS;  Surgeon: Claud Kelp, MD;  Location: [facility] ORS;  Service: General;  Laterality: N/A;  . TUBAL LIGATION      Allergies: Codeine and  Contrast media [iodinated diagnostic agents]  Medications: Prior to Admission medications   Medication Sig Start Date End Date Taking? Authorizing Provider  acetaminophen (TYLENOL) 650 MG CR tablet Take 650-1,300 mg by mouth every 8 (eight) hours as needed for pain.    [provider]  albuterol (VENTOLIN HFA) 108 (90 Base) MCG/ACT inhaler Inhale 2 puffs into the lungs every 6 (six) hours as needed for wheezing or shortness of breath.  06/15/15 06/14/16  [provider]  cephALEXin (KEFLEX) 500 MG capsule Take 1 capsule (500 mg total) by mouth 4 (four) times daily. 05/31/16   Harris, Cammy Copa, PA-C  insulin aspart (NOVOLOG) 100 UNIT/ML injection Inject 5-10 Units into the skin 3 (three) times daily with meals as needed for high blood sugar (per sliding scale).  10/02/15   [provider]  Insulin Glargine (LANTUS SOLOSTAR) 100 UNIT/ML Solostar Pen Inject 25 Units into the skin at bedtime. Patient taking differently: Inject 20 Units into the skin at bedtime.  11/03/15   Penny Pia, MD  ondansetron (ZOFRAN) 4 MG tablet Take 1 tablet (4 mg total) by mouth every 8 (eight) hours as needed for nausea or vomiting. Patient not taking: Reported on 01/28/2016 11/03/15   Penny Pia, MD  traMADol (ULTRAM) 50 MG tablet Take 1 tablet (50 mg total) by mouth 2 (two) times daily as needed for moderate pain or severe pain. 01/28/16   Fayrene Helper, PA-C     Family History  Problem Relation Age of Onset  . Diabetes Mother   . Lymphoma Father   . Lymphoma Brother  Social History   Social History  . Marital status: Single    Spouse name: N/A  . Number of children: N/A  . Years of education: N/A   Social History Main Topics  . Smoking status: Never Smoker  . Smokeless tobacco: Never Used  . Alcohol use No  . Drug use: No  . Sexual activity: No   Other Topics Concern  . None   Social History Narrative  . None     Review of Systems: A 12 point ROS discussed and  pertinent positives are indicated in the HPI above.  All other systems are negative.  Review of Systems  Constitutional: Positive for activity change. Negative for fatigue and fever.  Respiratory: Negative for cough and shortness of breath.   Cardiovascular: Negative for chest pain.  Gastrointestinal: Negative for abdominal pain.  Musculoskeletal: Positive for back pain. Negative for gait problem.  Neurological: Negative for weakness.  Psychiatric/Behavioral: Negative for behavioral problems and confusion.    Vital Signs: BP (!) 173/90   Pulse 69   Temp 98.3 F (36.8 C) (Oral)   Resp 16   Ht 5\' 7"  (1.702 m)   Wt (!) 345 lb (156.5 kg)   SpO2 100%   BMI 54.03 kg/m   Physical Exam  Constitutional: She is oriented to person, place, and time.  Cardiovascular: Normal rate, regular rhythm and normal heart sounds.   Pulmonary/Chest: Effort normal and breath sounds normal.  Abdominal: Soft. Bowel sounds are normal.  Musculoskeletal: Normal range of motion.  Right buttock and low back pain  Neurological: She is alert and oriented to person, place, and time.  Skin: Skin is warm and dry.  Psychiatric: She has a normal mood and affect. Her behavior is normal. Thought content normal.  Nursing note and vitals reviewed.   Imaging: Ct Pelvis Wo Contrast  Result Date: 11/09/2016 CLINICAL DATA:  Postoperative pain. Right buttock abscess debridement in September 2017. EXAM: CT PELVIS WITHOUT CONTRAST TECHNIQUE: Multidetector CT imaging of the pelvis was performed following the standard protocol without intravenous contrast. COMPARISON:  Pelvic CT 10/13/2015 FINDINGS: Urinary Tract:  Urinary bladder is decompressed. Bowel: Bowel structures are unremarkable without significant bowel dilatation or focal inflammation. Vascular/Lymphatic: Again noted is a prominent lymph node just anterior to the distal IVC measuring roughly 1 cm in the short axis. Mildly prominent lymph nodes along the iliac chains  are similar to the prior examination. No significant abnormality to the visualize arterial or venous structures. Again noted are prominent lymph nodes in the upper thighs. Reproductive: Uterus is prominent with a central calcification and similar to the previous examination. Left ovary is mildly prominent and unchanged since 2017. No gross adnexal lesion. Uterus is large for size measuring greater than 15 cm in length. Other: No free fluid in the pelvis. Patient is morbidly obese with extensive subcutaneous fat. Band-like soft tissue thickening extending from the posterior skin surface into the deep subcutaneous tissues in the back. There is a focal soft tissue structure or collection at the lumbosacral level that measures 3.5 x 5.2 x 8.1 cm. This collection is low density and could represent fluid. Limited evaluation of this collection without intravenous contrast. Musculoskeletal: Both hips are located.  No acute bone abnormality. IMPRESSION: Indeterminate soft tissue or fluid collection in the posterior subcutaneous tissues. This area could be better characterized with ultrasound or post-contrast CT. Band of soft tissue thickening that extends to the skin surface suggests there may have been a cutaneous fistula or postsurgical changes at  this area. Electronically Signed   By: Richarda OverlieAdam  Henn M.D.   On: 11/09/2016 11:52    Labs:  CBC: No results for input(s): WBC, HGB, HCT, PLT in the last 8760 hours.  COAGS: No results for input(s): INR, APTT in the last 8760 hours.  BMP: No results for input(s): NA, K, CL, CO2, GLUCOSE, BUN, CALCIUM, CREATININE, GFRNONAA, GFRAA in the last 8760 hours.  Invalid input(s): CMP  LIVER FUNCTION TESTS: No results for input(s): BILITOT, AST, ALT, ALKPHOS, PROT, ALBUMIN in the last 8760 hours.  TUMOR MARKERS: No results for input(s): AFPTM, CEA, CA199, CHROMGRNA in the last 8760 hours.  Assessment and Plan:  Right buttock abscess debridement 10/2015 Has done well  until recent low back and buttock pain Imaging reveals collection at pre sacral area Scheduled now for aspiration/biopsy Risks and benefits discussed with the patient including, but not limited to bleeding, infection, damage to adjacent structures or low yield requiring additional tests. All of the patient's questions were answered, patient is agreeable to proceed. Consent signed and in chart.   Thank you for this interesting consult.  I greatly enjoyed meeting Tiffinie C Gorin and look forward to participating in their care.  A copy of this report was sent to the requesting provider on this date.  Electronically Signed: Robet LeuURPIN,Demetrus Pavao A, PA-C 11/29/2016, 10:45 AM   I spent a total of  30 Minutes   in face to face in clinical consultation, greater than 50% of which was counseling/coordinating care for pre sacral collection aspiration

## 2016-11-29 NOTE — Procedures (Signed)
  Procedure: CT aspiration and biopsy pelvic body wall mass 18g x4 Preprocedure diagnosis: Pelvic pain and mass Postprocedure diagnosis: same EBL:   minimal Complications:  none immediate  See full dictation in [facility] [employer] PACS.  Thora Lance. Sabena Winner MD Main # [phone] Pager  [phone]

## 2016-11-29 NOTE — Discharge Instructions (Signed)
Needle Biopsy, Care After  Moderate Conscious Sedation, Adult, Care After These instructions provide you with information about caring for yourself after your procedure. Your health care provider may also give you more specific instructions. Your treatment has been planned according to current medical practices, but problems sometimes occur. Call your health care provider if you have any problems or questions after your procedure. What can I expect after the procedure? After your procedure, it is common:  To feel sleepy for several hours.  To feel clumsy and have poor balance for several hours.  To have poor judgment for several hours.  To vomit if you eat too soon.  Follow these instructions at home: For at least 24 hours after the procedure:   Do not: ? Participate in activities where you could fall or become injured. ? Drive. ? Use heavy machinery. ? Drink alcohol. ? Take sleeping pills or medicines that cause drowsiness. ? Make important decisions or sign legal documents. ? Take care of children on your own.  Rest. Eating and drinking  Follow the diet recommended by your health care provider.  If you vomit: ? Drink water, juice, or soup when you can drink without vomiting. ? Make sure you have little or no nausea before eating solid foods. General instructions  Have a responsible adult stay with you until you are awake and alert.  Take over-the-counter and prescription medicines only as told by your health care provider.  If you smoke, do not smoke without supervision.  Keep all follow-up visits as told by your health care provider. This is important. Contact a health care provider if:  You keep feeling nauseous or you keep vomiting.  You feel light-headed.  You develop a rash.  You have a fever. Get help right away if:  You have trouble breathing. This information is not intended to replace advice given to you by your health care provider. Make sure you  discuss any questions you have with your health care provider. Document Released: 11/11/2012 Document Revised: 06/26/2015 Document Reviewed: 05/13/2015 Elsevier Interactive Patient Education  [employer]2018 [employer]. These instructions give you information about caring for yourself after your procedure. Your doctor may also give you more specific instructions. Call your doctor if you have any problems or questions after your procedure. Follow these instructions at home:  Rest as told by your doctor.  Take medicines only as told by your doctor.  There are many different ways to close and cover the biopsy site, including stitches (sutures), skin glue, and adhesive strips. Follow instructions from your doctor about: ? How to take care of your biopsy site. ? When and how you should change your bandage (dressing). ? When you should remove your dressing. ? Removing whatever was used to close your biopsy site.  Check your biopsy site every day for signs of infection. Watch for: ? Redness, swelling, or pain. ? Fluid, blood, or pus. Contact a doctor if:  You have a fever.  You have redness, swelling, or pain at the biopsy site, and it lasts longer than a few days.  You have fluid, blood, or pus coming from the biopsy site.  You feel sick to your stomach (nauseous).  You throw up (vomit). Get help right away if:  You are short of breath.  You have trouble breathing.  Your chest hurts.  You feel dizzy or you pass out (faint).  You have bleeding that does not stop with pressure or a bandage.  You cough up blood.  Your belly (abdomen) hurts. This information is not intended to replace advice given to you by your health care provider. Make sure you discuss any questions you have with your health care provider. Document Released: 01/04/2008 Document Revised: 06/29/2015 Document Reviewed: 01/17/2014 Elsevier Interactive Patient Education  [employer].

## 2016-11-29 NOTE — Sedation Documentation (Signed)
Patient is resting comfortably. 

## 2017-05-05 ENCOUNTER — Emergency Department (HOSPITAL_COMMUNITY): Payer: Self-pay

## 2017-05-05 ENCOUNTER — Emergency Department (HOSPITAL_COMMUNITY)
Admission: EM | Admit: 2017-05-05 | Discharge: 2017-05-05 | Disposition: A | Discharge status: Home / Self Care | Payer: Self-pay | Attending: Emergency Medicine | Admitting: Emergency Medicine

## 2017-05-05 ENCOUNTER — Encounter (HOSPITAL_COMMUNITY): Payer: Self-pay | Admitting: Emergency Medicine

## 2017-05-05 DIAGNOSIS — Z794 Long term (current) use of insulin: Secondary | ICD-10-CM | POA: Insufficient documentation

## 2017-05-05 DIAGNOSIS — J069 Acute upper respiratory infection, unspecified: Secondary | ICD-10-CM | POA: Insufficient documentation

## 2017-05-05 DIAGNOSIS — R509 Fever, unspecified: Secondary | ICD-10-CM | POA: Insufficient documentation

## 2017-05-05 DIAGNOSIS — Z79899 Other long term (current) drug therapy: Secondary | ICD-10-CM | POA: Insufficient documentation

## 2017-05-05 DIAGNOSIS — R0981 Nasal congestion: Secondary | ICD-10-CM | POA: Insufficient documentation

## 2017-05-05 DIAGNOSIS — E119 Type 2 diabetes mellitus without complications: Secondary | ICD-10-CM | POA: Insufficient documentation

## 2017-05-05 DIAGNOSIS — R05 Cough: Secondary | ICD-10-CM | POA: Insufficient documentation

## 2017-05-05 LAB — RAPID STREP SCREEN (MED CTR MEBANE ONLY): STREPTOCOCCUS, GROUP A SCREEN (DIRECT): NEGATIVE

## 2017-05-05 MED ORDER — BENZONATATE 100 MG PO CAPS: 100.0000 mg | ORAL_CAPSULE | Freq: Three times a day (TID) | ORAL | 0 refills | Status: DC

## 2017-05-05 NOTE — Discharge Instructions (Signed)
Your chest x-ray was reassuring.  No pneumonia.  Strep test negative.  I have prescribed you medicine for cough.  For your sore throat you can take ibuprofen, warm liquids, over-the-counter throat lozenges, salt water gargles.  Please drink plenty of fluids and get rest.  Schedule an appointment with your regular doctor to recheck your blood pressure which was elevated in the ER today.  Return if you have any trouble breathing, develop chest pain, have vomiting that will not stop or of any new or concerning symptoms.

## 2017-05-05 NOTE — ED Provider Notes (Addendum)
[facility]-EMERGENCY DEPT Provider Note   CSN: [id number] Arrival date & time: 05/05/17  1644     History   Chief Complaint Chief Complaint  Patient presents with  . Sore Throat  . Nasal Congestion    HPI [name] is a 47 y.o. female.  HPI  [name] is a 47 year old female with a history of type 2 diabetes, depression and allergies who presents to the emergency department for evaluation of cough, congestion, sore throat and fever.  Patient reports that her symptoms began 6 days ago.  Reports her cough is productive of a green/yellow sputum.  She occasionally has shortness of breath with cough attacks, but denies shortness of breath at rest.  Denies associated chest pain.  She states that she had a fever of 101 F 5 days ago, but has not had one since.  States that her sore throat is 9/10 in severity and worsened with swallowing.  She has tried taking over-the-counter Mucinex, DayQuil and NyQuil without significant improvement.  She denies headache, body aches, neck pain/stiffness, dysphagia, voice change, abdominal pain, nausea/vomiting.  She denies close contacts with similar.  She is otherwise been able to eat and drink normally.  Past Medical History:  Diagnosis Date  . Acute renal failure (ARF) (HCC)   . Allergy   . Depression   . Diabetes mellitus   . Morbid obesity [facility](HCC)     Patient Active Problem List   Diagnosis Date Noted  . ATN (acute tubular necrosis) (HCC) 10/31/2015  . Cellulitis 10/19/2015  . Necrotizing soft tissue infection 10/19/2015  . Acute sinus infection 07/07/2013  . Gall stones 07/07/2013  . Osteoarthritis 03/09/2013  . Transaminitis 01/26/2013  . Depression 01/26/2013  . DM type 2 goal A1C below 7.5 01/26/2013  . Diabetes mellitus (HCC) 07/15/2012  . Disorder of bone and cartilage 07/15/2012  . Morbid obesity (HCC) 07/15/2012  . Other malaise and fatigue 07/15/2012    Past Surgical History:  Procedure  Laterality Date  . INCISION AND DRAINAGE ABSCESS N/A 10/21/2015   Procedure: dressing change to  PRESACRAL ABCESS;  Surgeon: Romie LeveeAlicia Thomas, MD;  Location: [facility] ORS;  Service: General;  Laterality: N/A;  dressing to sacral area  . IR GENERIC HISTORICAL  10/27/2015   IR FLUORO GUIDE CV LINE RIGHT 10/27/2015 Gilmer MorJaime Wagner, DO [facility]-INTERV RAD  . IR GENERIC HISTORICAL  10/27/2015   IR US GUIDE VASC ACCESS RIGHT 10/27/2015 Gilmer MorJaime Wagner, DO [facility]  . IRRIGATION AND DEBRIDEMENT ABSCESS N/A 10/19/2015   Procedure: IRRIGATION AND DEBRIDEMENT PRESACRAL ABSCESS;  Surgeon: Claud KelpHaywood Ingram, MD;  Location: [facility] ORS;  Service: General;  Laterality: N/A;  . TUBAL LIGATION       OB History   None      Home Medications    Prior to Admission medications   Medication Sig Start Date End Date Taking? Authorizing Provider  acetaminophen (TYLENOL) 650 MG CR tablet Take 650-1,300 mg by mouth every 8 (eight) hours as needed for pain.    [provider]  albuterol (VENTOLIN HFA) 108 (90 Base) MCG/ACT inhaler Inhale 2 puffs into the lungs every 6 (six) hours as needed for wheezing or shortness of breath.  06/15/15 06/14/16  [provider]  cephALEXin (KEFLEX) 500 MG capsule Take 1 capsule (500 mg total) by mouth 4 (four) times daily. 05/31/16   Harris, Cammy CopaAbigail, PA-C  insulin aspart (NOVOLOG) 100 UNIT/ML injection Inject 5-10 Units into the skin 3 (three) times daily with meals as needed for  high blood sugar (per sliding scale).  10/02/15   [provider]  Insulin Glargine (LANTUS SOLOSTAR) 100 UNIT/ML Solostar Pen Inject 25 Units into the skin at bedtime. Patient taking differently: Inject 20 Units into the skin at bedtime.  11/03/15   Penny Pia, MD  ondansetron (ZOFRAN) 4 MG tablet Take 1 tablet (4 mg total) by mouth every 8 (eight) hours as needed for nausea or vomiting. Patient not taking: Reported on 01/28/2016 11/03/15   Penny Pia, MD  traMADol (ULTRAM) 50 MG tablet Take 1 tablet (50 mg  total) by mouth 2 (two) times daily as needed for moderate pain or severe pain. 01/28/16   Fayrene Helper, PA-C    Family History Family History  Problem Relation Age of Onset  . Diabetes Mother   . Lymphoma Father   . Lymphoma Brother     Social History Social History   Tobacco Use  . Smoking status: Never Smoker  . Smokeless tobacco: Never Used  Substance Use Topics  . Alcohol use: No  . Drug use: No     Allergies   Codeine and Contrast media [iodinated diagnostic agents]   Review of Systems Review of Systems  Constitutional: Positive for fever.  HENT: Positive for congestion and sore throat. Negative for rhinorrhea and trouble swallowing.   Respiratory: Positive for cough. Negative for shortness of breath and wheezing.   Cardiovascular: Negative for chest pain.  Gastrointestinal: Negative for abdominal pain, nausea and vomiting.  Musculoskeletal: Negative for myalgias.  Skin: Negative for rash.  Neurological: Negative for headaches.     Physical Exam Updated Vital Signs BP 140/79 (BP Location: Left Arm)   Pulse 77   Temp 98.4 F (36.9 C) (Oral)   Resp 18   Ht 5\' 6"  (1.676 m)   Wt (!) 150.6 kg (332 lb)   SpO2 95%   BMI 53.59 kg/m   Physical Exam  Constitutional: She is oriented to person, place, and time. She appears well-developed and well-nourished. No distress.  Sitting at bedside in no apparent distress.  HENT:  Head: Normocephalic and atraumatic.  Mucous membranes moist.  Posterior oropharynx appears mildly erythematous.  No tonsillar edema or exudate.  Uvula midline.  Airway patent and able to handle oral secretions.  No tenderness over maxillary or frontal sinuses.  Bilateral anterior chain cervical adenopathy.  Eyes: Pupils are equal, round, and reactive to light. Conjunctivae are normal. Right eye exhibits no discharge. Left eye exhibits no discharge.  Neck: Normal range of motion.  Cardiovascular: Normal rate, regular rhythm and intact distal  pulses. Exam reveals no friction rub.  No murmur heard. Pulmonary/Chest: Effort normal. No respiratory distress.  No respiratory distress, speaking in full sentences.  Lungs clear to auscultation.  Neurological: She is alert and oriented to person, place, and time. Coordination normal.  Skin: She is not diaphoretic.  Psychiatric: She has a normal mood and affect. Her behavior is normal.  Nursing note and vitals reviewed.    ED Treatments / Results  Labs (all labs ordered are listed, but only abnormal results are displayed) Labs Reviewed  RAPID STREP SCREEN (NOT AT [facility])  CULTURE, GROUP A STREP [facility])    EKG None  Radiology Dg Chest 2 View  Result Date: 05/05/2017 CLINICAL DATA:  Productive cough, fever EXAM: CHEST - 2 VIEW COMPARISON:  03/16/2015 FINDINGS: Lungs are clear.  No pleural effusion or pneumothorax. The heart is normal in size. Visualized osseous structures are within normal limits. IMPRESSION: Normal chest radiographs. Electronically Signed  By: Charline Bills M.D.   On: 05/05/2017 20:24    Procedures Procedures (including critical care time)  Medications Ordered in ED Medications - No data to display   Initial Impression / Assessment and Plan / ED Course  I have reviewed the triage vital signs and the nursing notes.  Pertinent labs & imaging results that were available during my care of the patient were reviewed by me and considered in my medical decision making (see chart for details).     Pt CXR negative for acute infiltrate. Rapid strep test negative. Patients symptoms are consistent with URI, likely viral etiology. Discussed that antibiotics are not indicated for viral infections. Pt will be discharged with symptomatic treatment.  Her blood pressure is elevated in the ER today, counseled her to have this rechecked by her primary care doctor.  She is able to tolerate PO fluids at the bedside. She verbalizes understanding and is agreeable with plan. Pt is  hemodynamically stable & in NAD prior to dc.  Final Clinical Impressions(s) / ED Diagnoses   Final diagnoses:  Viral upper respiratory tract infection    ED Discharge Orders        Ordered    benzonatate (TESSALON) 100 MG capsule  Every 8 hours     05/05/17 2041       Kellie Shropshire, PA-C 05/05/17 2040    Kellie Shropshire, PA-C 05/05/17 2041    Wynetta Fines, MD 05/05/17 2358

## 2017-05-05 NOTE — ED Triage Notes (Signed)
Patient c/o cough, congestion, sore throat and fever x6 days. Denies N/V/D. Speaking in full sentences without difficulty. Afebrile in triage.

## 2017-05-08 ENCOUNTER — Emergency Department (HOSPITAL_COMMUNITY)
Admission: EM | Admit: 2017-05-08 | Discharge: 2017-05-08 | Disposition: A | Discharge status: Home / Self Care | Payer: Self-pay | Attending: Emergency Medicine | Admitting: Emergency Medicine

## 2017-05-08 ENCOUNTER — Encounter (HOSPITAL_COMMUNITY): Payer: Self-pay | Admitting: *Deleted

## 2017-05-08 DIAGNOSIS — Z79899 Other long term (current) drug therapy: Secondary | ICD-10-CM | POA: Insufficient documentation

## 2017-05-08 DIAGNOSIS — E119 Type 2 diabetes mellitus without complications: Secondary | ICD-10-CM | POA: Insufficient documentation

## 2017-05-08 DIAGNOSIS — H6123 Impacted cerumen, bilateral: Secondary | ICD-10-CM | POA: Insufficient documentation

## 2017-05-08 DIAGNOSIS — Z794 Long term (current) use of insulin: Secondary | ICD-10-CM | POA: Insufficient documentation

## 2017-05-08 DIAGNOSIS — H6690 Otitis media, unspecified, unspecified ear: Secondary | ICD-10-CM | POA: Insufficient documentation

## 2017-05-08 LAB — CBC
HCT: 37.6 % (ref 36.0–46.0)
HEMOGLOBIN: 12.5 g/dL (ref 12.0–15.0)
MCH: 27.4 pg (ref 26.0–34.0)
MCHC: 33.2 g/dL (ref 30.0–36.0)
MCV: 82.5 fL (ref 78.0–100.0)
Platelets: 236 10*3/uL (ref 150–400)
RBC: 4.56 MIL/uL (ref 3.87–5.11)
RDW: 14 % (ref 11.5–15.5)
WBC: 6.1 10*3/uL (ref 4.0–10.5)

## 2017-05-08 LAB — BASIC METABOLIC PANEL
ANION GAP: 13 (ref 5–15)
BUN: 12 mg/dL (ref 6–20)
CALCIUM: 9.7 mg/dL (ref 8.9–10.3)
CO2: 23 mmol/L (ref 22–32)
Chloride: 102 mmol/L (ref 101–111)
Creatinine, Ser: 0.67 mg/dL (ref 0.44–1.00)
GFR calc Af Amer: >60 mL/min (ref >60)
Glucose, Bld: 158 mg/dL — ABNORMAL HIGH (ref 65–99)
Potassium: 3.9 mmol/L (ref 3.5–5.1)
Sodium: 138 mmol/L (ref 135–145)

## 2017-05-08 LAB — URINALYSIS, ROUTINE W REFLEX MICROSCOPIC
BILIRUBIN URINE: NEGATIVE
Glucose, UA: >=500 mg/dL — AB
HGB URINE DIPSTICK: NEGATIVE
Ketones, ur: 80 mg/dL — AB
Leukocytes, UA: NEGATIVE
NITRITE: NEGATIVE
PROTEIN: NEGATIVE mg/dL
Specific Gravity, Urine: 1.036 — ABNORMAL HIGH (ref 1.005–1.030)
pH: 6 (ref 5.0–8.0)

## 2017-05-08 LAB — CBG MONITORING, ED: Glucose-Capillary: 145 mg/dL — ABNORMAL HIGH (ref 65–99)

## 2017-05-08 LAB — CULTURE, GROUP A STREP (THRC)

## 2017-05-08 LAB — I-STAT BETA HCG BLOOD, ED (MC, WL, AP ONLY)

## 2017-05-08 MED ORDER — AMOXICILLIN-POT CLAVULANATE 875-125 MG PO TABS: 1.0000 | ORAL_TABLET | Freq: Two times a day (BID) | ORAL | 0 refills | Status: DC

## 2017-05-08 NOTE — Discharge Instructions (Signed)
Please read attached information. If you experience any new or worsening signs or symptoms please return to the emergency room for evaluation. Please follow-up with your primary care provider or specialist as discussed. Please use medication prescribed only as directed and discontinue taking if you have any concerning signs or symptoms.   °

## 2017-05-08 NOTE — ED Notes (Signed)
Patient able to ambulate independently  

## 2017-05-08 NOTE — ED Provider Notes (Addendum)
[facility] Provider Note   CSN: [id number] Arrival date & time: 05/08/17  [id number]     History   Chief Complaint Chief Complaint  Patient presents with  . Hyperglycemia    HPI [name] is a 47 y.o. female.  HPI   47 year old female presented today with complaints of upper respiratory infection.  Patient notes that 9 days ago she developed rhinorrhea nasal congestion and dry nonproductive cough.  She notes that her right ear has been painful.  She also notes that when she tries to eat solid foods she feels it as if it is not passing appropriately.  She denies any difficulty swallowing, she denies any drooling, change in voice, swelling, or inability to tolerate p.o.  She denies any pain in her throat at rest or with swallowing.  Patient denies any indigestion.  She also reports that her blood sugar was slightly elevated that she not take her insulin today.  Patient denies any fever, neck stiffness, shortness of breath or productive cough.  No other complaints here today.    Past Medical History:  Diagnosis Date  . Acute renal failure (ARF) (HCC)   . Allergy   . Depression   . Diabetes mellitus   . Morbid obesity [facility](HCC)     Patient Active Problem List   Diagnosis Date Noted  . ATN (acute tubular necrosis) (HCC) 10/31/2015  . Cellulitis 10/19/2015  . Necrotizing soft tissue infection 10/19/2015  . Acute sinus infection 07/07/2013  . Gall stones 07/07/2013  . Osteoarthritis 03/09/2013  . Transaminitis 01/26/2013  . Depression 01/26/2013  . DM type 2 goal A1C below 7.5 01/26/2013  . Diabetes mellitus (HCC) 07/15/2012  . Disorder of bone and cartilage 07/15/2012  . Morbid obesity (HCC) 07/15/2012  . Other malaise and fatigue 07/15/2012    Past Surgical History:  Procedure Laterality Date  . INCISION AND DRAINAGE ABSCESS N/A 10/21/2015   Procedure: dressing change to  PRESACRAL ABCESS;  Surgeon: Romie LeveeAlicia Thomas, MD;  Location: [facility]  ORS;  Service: General;  Laterality: N/A;  dressing to sacral area  . IR GENERIC HISTORICAL  10/27/2015   IR FLUORO GUIDE CV LINE RIGHT 10/27/2015 Gilmer MorJaime Wagner, DO [facility]-INTER[facility]  . IR GENERIC HISTORICAL  10/27/2015   IR US GUIDE VASC ACCESS RIGHT 10/27/2015 Gilmer MorJaime Wagner, DO [facility]  . IRRIGATION AND DEBRIDEMENT ABSCESS N/A 10/19/2015   Procedure: IRRIGATION AND DEBRIDEMENT PRESACRAL ABSCESS;  Surgeon: Claud KelpHaywood Ingram, MD;  Location: [facility] ORS;  Service: General;  Laterality: N/A;  . TUBAL LIGATION       OB History   None      Home Medications    Prior to Admission medications   Medication Sig Start Date End Date Taking? Authorizing Provider  acetaminophen (TYLENOL) 650 MG CR tablet Take 650-1,300 mg by mouth every 8 (eight) hours as needed for pain.    [provider]  albuterol (VENTOLIN HFA) 108 (90 Base) MCG/ACT inhaler Inhale 2 puffs into the lungs every 6 (six) hours as needed for wheezing or shortness of breath.  06/15/15 06/14/16  [provider]  amoxicillin-clavulanate (AUGMENTIN) 875-125 MG tablet Take 1 tablet by mouth every 12 (twelve) hours. 05/08/17   Philippa Vessey, Tinnie GensJeffrey, PA-C  benzonatate (TESSALON) 100 MG capsule Take 1 capsule (100 mg total) by mouth every 8 (eight) hours. 05/05/17   Kellie ShropshireShrosbree, Emily J, PA-C  cephALEXin (KEFLEX) 500 MG capsule Take 1 capsule (500 mg total) by mouth 4 (four) times daily. 05/31/16  Harris, Abigail, PA-C  insulin aspart (NOVOLOG) 100 UNIT/ML injection Inject 5-10 Units into the skin 3 (three) times daily with meals as needed for high blood sugar (per sliding scale).  10/02/15   [provider]  Insulin Glargine (LANTUS SOLOSTAR) 100 UNIT/ML Solostar Pen Inject 25 Units into the skin at bedtime. Patient taking differently: Inject 20 Units into the skin at bedtime.  11/03/15   Penny Pia, MD  ondansetron (ZOFRAN) 4 MG tablet Take 1 tablet (4 mg total) by mouth every 8 (eight) hours as needed for nausea or vomiting. Patient not  taking: Reported on 01/28/2016 11/03/15   Penny Pia, MD  traMADol (ULTRAM) 50 MG tablet Take 1 tablet (50 mg total) by mouth 2 (two) times daily as needed for moderate pain or severe pain. 01/28/16   Fayrene Helper, PA-C    Family History Family History  Problem Relation Age of Onset  . Diabetes Mother   . Lymphoma Father   . Lymphoma Brother     Social History Social History   Tobacco Use  . Smoking status: Never Smoker  . Smokeless tobacco: Never Used  Substance Use Topics  . Alcohol use: No  . Drug use: No     Allergies   Codeine and Contrast media [iodinated diagnostic agents]   Review of Systems Review of Systems  All other systems reviewed and are negative.    Physical Exam Updated Vital Signs BP (!) 195/99 (BP Location: Right Arm)   Pulse 81   Temp 98.1 F (36.7 C) (Oral)   Resp 18   SpO2 100%   Physical Exam  Constitutional: She is oriented to person, place, and time. She appears well-developed and well-nourished.  HENT:  Head: Normocephalic and atraumatic.  Bilateral cerumen impaction-status post disimpaction right TM erythematous and bulging, left TM normal external auditory mouth without erythema  Uvula midline rises with phonation no edema, no enlargement of tonsils, no swelling or edema, no exudate, no signs of PTA RPA or pooling of secretion neck is nontender supple with no swelling full active range of motion  Eyes: Pupils are equal, round, and reactive to light. Conjunctivae are normal. Right eye exhibits no discharge. Left eye exhibits no discharge. No scleral icterus.  Neck: Normal range of motion. No JVD present. No tracheal deviation present.  Cardiovascular: Normal rate, regular rhythm and normal heart sounds. Exam reveals no friction rub.  No murmur heard. Pulmonary/Chest: Effort normal. No stridor. No respiratory distress. She has no wheezes. She has no rales. She exhibits no tenderness.  Neurological: She is alert and oriented to person,  place, and time. Coordination normal.  Psychiatric: She has a normal mood and affect. Her behavior is normal. Judgment and thought content normal.  Nursing note and vitals reviewed.    ED Treatments / Results  Labs (all labs ordered are listed, but only abnormal results are displayed) Labs Reviewed  BASIC METABOLIC PANEL - Abnormal; Notable for the following components:      Result Value   Glucose, Bld 158 (*)    All other components within normal limits  URINALYSIS, ROUTINE W REFLEX MICROSCOPIC - Abnormal; Notable for the following components:   Specific Gravity, Urine 1.036 (*)    Glucose, UA >=500 (*)    Ketones, ur 80 (*)    Bacteria, UA RARE (*)    Squamous Epithelial / LPF 0-5 (*)    All other components within normal limits  CBG MONITORING, ED - Abnormal; Notable for the following components:  Glucose-Capillary 145 (*)    All other components within normal limits  CBC  CBG MONITORING, ED  I-STAT BETA HCG BLOOD, ED ([facility], [facility], [facility] ONLY)    EKG None  Radiology No results found.  Procedures .Ear Cerumen Removal Date/Time: 05/08/2017 12:30 PM Performed by: Eyvonne Mechanic, PA-C Authorized by: Eyvonne Mechanic, PA-C   Consent:    Consent obtained:  Verbal   Consent given by:  Patient   Risks discussed:  Dizziness, incomplete removal, infection, bleeding, pain and TM perforation   Alternatives discussed:  No treatment and alternative treatment Procedure details:    Location:  L ear and R ear   Procedure type: curette   Post-procedure details:    Inspection:  TM intact   Hearing quality:  Improved   Patient tolerance of procedure:  Tolerated well, no immediate complications   (including critical care time)  Medications Ordered in ED Medications - No data to display   Initial Impression / Assessment and Plan / ED Course  I have reviewed the triage vital signs and the nursing notes.  Pertinent labs & imaging results that were available during my care of the  patient were reviewed by me and considered in my medical decision making (see chart for details).      Final Clinical Impressions(s) / ED Diagnoses   Final diagnoses:  Acute otitis media, unspecified otitis media type    Labs: UA, i-STAT beta-hCG, BMP CBC, CBG  Imaging:  Consults:  Therapeutics:  Discharge Meds: augmentin   Assessment/Plan: 47 year old female presents today with otitis media.  Patient complains of difficulty swallowing, she is very well-appearing no acute distress.  She is tolerating secretions tolerating p.o., with no discomfort or signs of swelling I have low suspicion for deep space infection.  Patient will be treated with antibiotics for acute otitis media.  Patient also hypertensive here, she reports that primary care has been monitoring her blood pressure as it has been fluctuating, no signs of endorgan damage here, patient will follow up with primary care as soon as possible for recheck of blood pressure and ongoing management.  Slightly hyperglycemic here patient did not use her insulin today she will use it as directed.  She is given strict return precautions, she verbalized understanding and agreement to today's plan had no further questions or concerns      ED Discharge Orders        Ordered    amoxicillin-clavulanate (AUGMENTIN) 875-125 MG tablet  Every 12 hours     05/08/17 1234       Eyvonne Mechanic, PA-C 05/08/17 1235    Quenna Doepke, Tinnie Gens, PA-C 05/08/17 1235    Pricilla Loveless, MD 05/08/17 2253

## 2017-05-08 NOTE — ED Triage Notes (Signed)
Pt in stating that Sunday night she thinks she got something stuck in her throat, since that time she is unable to get any solid foods down or medication, because of that her glucose level has increased and she tested moderate ketones in her urine this morning, no distress noted

## 2018-03-31 ENCOUNTER — Other Ambulatory Visit: Payer: Self-pay

## 2018-03-31 ENCOUNTER — Encounter: Payer: Self-pay | Admitting: Emergency Medicine

## 2018-03-31 ENCOUNTER — Emergency Department
Admission: EM | Admit: 2018-03-31 | Discharge: 2018-03-31 | Disposition: A | Discharge status: Home / Self Care | Payer: PRIVATE HEALTH INSURANCE | Attending: Emergency Medicine | Admitting: Emergency Medicine

## 2018-03-31 DIAGNOSIS — E119 Type 2 diabetes mellitus without complications: Secondary | ICD-10-CM | POA: Insufficient documentation

## 2018-03-31 DIAGNOSIS — R509 Fever, unspecified: Secondary | ICD-10-CM | POA: Diagnosis present

## 2018-03-31 DIAGNOSIS — Z794 Long term (current) use of insulin: Secondary | ICD-10-CM | POA: Diagnosis not present

## 2018-03-31 DIAGNOSIS — J111 Influenza due to unidentified influenza virus with other respiratory manifestations: Secondary | ICD-10-CM | POA: Insufficient documentation

## 2018-03-31 DIAGNOSIS — Z79899 Other long term (current) drug therapy: Secondary | ICD-10-CM | POA: Diagnosis not present

## 2018-03-31 MED ORDER — BENZONATATE 100 MG PO CAPS: 100.0000 mg | ORAL_CAPSULE | Freq: Four times a day (QID) | ORAL | 0 refills | Status: DC | PRN

## 2018-03-31 NOTE — ED Triage Notes (Signed)
PT c/o cough, fever, body aches xfew days. PT is being seen with another family member with same symptoms. NAD noted

## 2018-03-31 NOTE — ED Provider Notes (Signed)
[facility] Emergency Department Provider Note  ____________________________________________  Time seen: Approximately 3:30 PM  I have reviewed the triage vital signs and the nursing notes.   HISTORY  Chief Complaint Cough and Fever    HPI [name] is a 48 y.o. female who presents the emergency department complaining of sudden onset of fevers, chills, body aches, nasal congestion, sore throat, cough.  Patient reports that symptoms began 2 days ago.  She has a family member with similar symptoms also being seen.  Patient has taken Advil for her symptoms but no other medications.  Patient denies any headache, visual changes, neck pain or stiffness, chest pain, shortness of breath, abdominal pain, nausea vomiting, diarrhea or constipation.  Patient does have a history of renal failure, diabetes but states no chronic medical problems or complaints.    Past Medical History:  Diagnosis Date  . Acute renal failure (ARF) (HCC)   . Allergy   . Depression   . Diabetes mellitus   . Morbid obesity [facility])     Patient Active Problem List   Diagnosis Date Noted  . ATN (acute tubular necrosis) (HCC) 10/31/2015  . Cellulitis 10/19/2015  . Necrotizing soft tissue infection 10/19/2015  . Acute sinus infection 07/07/2013  . Gall stones 07/07/2013  . Osteoarthritis 03/09/2013  . Transaminitis 01/26/2013  . Depression 01/26/2013  . DM type 2 goal A1C below 7.5 01/26/2013  . Diabetes mellitus (HCC) 07/15/2012  . Disorder of bone and cartilage 07/15/2012  . Morbid obesity (HCC) 07/15/2012  . Other malaise and fatigue 07/15/2012    Past Surgical History:  Procedure Laterality Date  . INCISION AND DRAINAGE ABSCESS N/A 10/21/2015   Procedure: dressing change to  PRESACRAL ABCESS;  Surgeon: Romie Levee, MD;  Location: [facility];  Service: General;  Laterality: N/A;  dressing to sacral area  . IR GENERIC HISTORICAL  10/27/2015   IR FLUORO GUIDE CV LINE RIGHT  10/27/2015 Gilmer Mor, DO [facility]  . IR GENERIC HISTORICAL  10/27/2015   IR US GUIDE VASC ACCESS RIGHT 10/27/2015 Gilmer Mor, DO [facility]  . IRRIGATION AND DEBRIDEMENT ABSCESS N/A 10/19/2015   Procedure: IRRIGATION AND DEBRIDEMENT PRESACRAL ABSCESS;  Surgeon: Claud Kelp, MD;  Location: [facility];  Service: General;  Laterality: N/A;  . TUBAL LIGATION      Prior to Admission medications   Medication Sig Start Date End Date Taking? Authorizing Provider  acetaminophen (TYLENOL) 650 MG CR tablet Take 650-1,300 mg by mouth every 8 (eight) hours as needed for pain.    [provider]  albuterol (VENTOLIN HFA) 108 (90 Base) MCG/ACT inhaler Inhale 2 puffs into the lungs every 6 (six) hours as needed for wheezing or shortness of breath.  06/15/15 06/14/16  [provider]  benzonatate (TESSALON PERLES) 100 MG capsule Take 1 capsule (100 mg total) by mouth every 6 (six) hours as needed for cough. 03/31/18 03/31/19  Cuthriell, Delorise Royals, PA-C  insulin aspart (NOVOLOG) 100 UNIT/ML injection Inject 5-10 Units into the skin 3 (three) times daily with meals as needed for high blood sugar (per sliding scale).  10/02/15   [provider]  Insulin Glargine (LANTUS SOLOSTAR) 100 UNIT/ML Solostar Pen Inject 25 Units into the skin at bedtime. Patient taking differently: Inject 20 Units into the skin at bedtime.  11/03/15   Penny Pia, MD    Allergies Codeine and Contrast media [iodinated diagnostic agents]  Family History  Problem Relation Age of Onset  . Diabetes Mother   .  Lymphoma Father   . Lymphoma Brother     Social History Social History   Tobacco Use  . Smoking status: Never Smoker  . Smokeless tobacco: Never Used  Substance Use Topics  . Alcohol use: No  . Drug use: No     Review of Systems  Constitutional: Positive fever/chills.  Positive for body aches Eyes: No visual changes. No discharge ENT: Positive for nasal congestion and sore  throat. Cardiovascular: no chest pain. Respiratory: Positive cough. No SOB. Gastrointestinal: No abdominal pain.  No nausea, no vomiting.  No diarrhea.  No constipation. Musculoskeletal: Negative for musculoskeletal pain. Skin: Negative for rash, abrasions, lacerations, ecchymosis. Neurological: Negative for headaches, focal weakness or numbness. 10-point ROS otherwise negative.  ____________________________________________   PHYSICAL EXAM:  VITAL SIGNS: ED Triage Vitals  Enc Vitals Group     BP 03/31/18 1428 118/69     Pulse Rate 03/31/18 1428 77     Resp 03/31/18 1427 16     Temp 03/31/18 1427 100 F (37.8 C)     Temp Source 03/31/18 1427 Oral     SpO2 03/31/18 1427 98 %     Weight --      Height --      Head Circumference --      Peak Flow --      Pain Score 03/31/18 1427 9     Pain Loc --      Pain Edu? --      Excl. in GC? --      Constitutional: Alert and oriented. Well appearing and in no acute distress. Eyes: Conjunctivae are normal. PERRL. EOMI. Head: Atraumatic. ENT:      Ears: EACs and TMs unremarkable bilaterally.      Nose: Mild congestion/rhinnorhea.      Mouth/Throat: Mucous membranes are moist.  Oropharynx is minimally erythematous but nonedematous.  Tonsils are symmetrical.  Uvula is midline. Neck: No stridor.  Neck is supple full range of motion Hematological/Lymphatic/Immunilogical: Scattered, mobile, nontender anterior cervical lymphadenopathy. Cardiovascular: Normal rate, regular rhythm. Normal S1 and S2.  Good peripheral circulation. Respiratory: Normal respiratory effort without tachypnea or retractions. Lungs CTAB. Good air entry to the bases with no decreased or absent breath sounds. Musculoskeletal: Full range of motion to all extremities. No gross deformities appreciated. Neurologic:  Normal speech and language. No gross focal neurologic deficits are appreciated.  Skin:  Skin is warm, dry and intact. No rash noted. Psychiatric: Mood and  affect are normal. Speech and behavior are normal. Patient exhibits appropriate insight and judgement.   ____________________________________________   LABS (all labs ordered are listed, but only abnormal results are displayed)  Labs Reviewed - No data to display ____________________________________________  EKG   ____________________________________________  RADIOLOGY   No results found.  ____________________________________________    PROCEDURES  Procedure(s) performed:    Procedures    Medications - No data to display   ____________________________________________   INITIAL IMPRESSION / ASSESSMENT AND PLAN / ED COURSE  Pertinent labs & imaging results that were available during my care of the patient were reviewed by me and considered in my medical decision making (see chart for details).  Review of the [place] CSRS was performed in accordance of the [facility] prior to dispensing any controlled drugs.      Patient's diagnosis is consistent with influenza.  Patient presents emergency department with sudden onset of flulike symptoms.  Exam was overall reassuring with no indication for labs or imaging at this time.  Patient will be diagnosed  based off his symptoms.  I offered Tamiflu and patient declines.  Tylenol Motrin for symptoms.  I will prescribe Tessalon Perles for her cough.  Follow-up with primary care as needed..  Patient is given ED precautions to return to the ED for any worsening or new symptoms.     ____________________________________________  FINAL CLINICAL IMPRESSION(S) / ED DIAGNOSES  Final diagnoses:  Influenza      NEW MEDICATIONS STARTED DURING THIS VISIT:  ED Discharge Orders         Ordered    benzonatate (TESSALON PERLES) 100 MG capsule  Every 6 hours PRN     03/31/18 1534              This chart was dictated using voice recognition software/Dragon. Despite best efforts to proofread, errors can occur which can change the  meaning. Any change was purely unintentional.    Racheal Patches, PA-C 03/31/18 1535    Phineas Semen, MD 03/31/18 [phone]

## 2018-07-02 ENCOUNTER — Ambulatory Visit: Payer: PRIVATE HEALTH INSURANCE | Admitting: Dietician

## 2018-07-22 ENCOUNTER — Other Ambulatory Visit: Payer: Self-pay

## 2018-07-22 ENCOUNTER — Ambulatory Visit: Payer: PRIVATE HEALTH INSURANCE | Admitting: Internal Medicine

## 2018-07-22 ENCOUNTER — Encounter: Payer: Self-pay | Admitting: Internal Medicine

## 2018-07-22 VITALS — BP 108/80 | HR 63 | Temp 97.8°F | Ht 66.75 in | Wt 333.8 lb

## 2018-07-22 DIAGNOSIS — E1142 Type 2 diabetes mellitus with diabetic polyneuropathy: Secondary | ICD-10-CM

## 2018-07-22 DIAGNOSIS — Z794 Long term (current) use of insulin: Secondary | ICD-10-CM

## 2018-07-22 LAB — POCT GLYCOSYLATED HEMOGLOBIN (HGB A1C): Hemoglobin A1C: 10.8 % — AB (ref 4.0–5.6)

## 2018-07-22 NOTE — Progress Notes (Signed)
Name: Madeline PattenVonee C Boies  MRN/ DOB: [id number], May 22, 1970   Age/ Sex: 48 y.o., female    PCP: Verlon AuBoyd, Tammy Lamonica, MD   Reason for Endocrinology Evaluation: Type 2 Diabetes Mellitus     Date of Initial Endocrinology Visit: 07/22/2018     PATIENT IDENTIFIER: [name] is a 48 y.o. female with a past medical history of T2DM, Hx of acute renal failure S/P temporary dialysis in 2017. The patient presented for initial endocrinology clinic visit on 07/22/2018 for consultative assistance with her diabetes management.    HPI: [name] was    Diagnosed with T2DM ~ 2000 Prior Medications tried/Intolerance: Ozempic and novolog - cost issues. Has been on insulin for years.  Currently checking blood sugars every now and then Hypoglycemia episodes : no                Hemoglobin A1c has ranged from 9.8% in 2014, peaking at 12.9% in 2011. Patient required assistance for hypoglycemia: no Patient has required hospitalization within the last 1 year from hyper or hypoglycemia: no  In terms of diet, the patient drinks sugar sweetened beverages, eats 1-2 meals a day, snacks a lot , likes sweets.  Works 3 pm to 11 PM . Wakes up 1 pm   HOME DIABETES REGIMEN: Metformin ER 2 tabs BID Jardiance 25 mg daily  Basaglar 20 units    Statin: no ACE-I/ARB: no Prior Diabetic Education:  Scheduled 07/24/2018   METER DOWNLOAD SUMMARY: Did not bring    DIABETIC COMPLICATIONS: Microvascular complications:   Neuropathy  Denies: CKD, retinopathy   Last eye exam: Completed  2019  Macrovascular complications:    Denies: CAD, PVD, CVA   PAST HISTORY: Past Medical History:  Past Medical History:  Diagnosis Date  . Acute renal failure (ARF) (HCC)   . Allergy   . Depression   . Diabetes mellitus   . Morbid obesity (HCC)    Past Surgical History:  Past Surgical History:  Procedure Laterality Date  . INCISION AND DRAINAGE ABSCESS N/A 10/21/2015   Procedure: dressing change  to  PRESACRAL ABCESS;  Surgeon: Romie LeveeAlicia Thomas, MD;  Location[facility];  Service: General;  Laterality: N/A;  dressing to sacral area  . IR GENERIC HISTORICAL  10/27/2015   IR FLUORO GUIDE CV LINE RIGHT 10/27/2015 Gilmer MorJaime Wagner, DO [facility]  . IR GENERIC HISTORICAL  10/27/2015   IR US GUIDE VASC ACCESS RIGHT 10/27/2015 Gilmer MorJaime Wagner, DO [facility]  . IRRIGATION AND DEBRIDEMENT ABSCESS N/A 10/19/2015   Procedure: IRRIGATION AND DEBRIDEMENT PRESACRAL ABSCESS;  Surgeon: Claud KelpHaywood Ingram, MD;  Location: [facility];  Service: General;  Laterality: N/A;  . TUBAL LIGATION        Social History:  reports that she has never smoked. She has never used smokeless tobacco. She reports that she does not drink alcohol or use drugs. Family History:  Family History  Problem Relation Age of Onset  . Diabetes Mother   . Lymphoma Father   . Lymphoma Brother      HOME MEDICATIONS: Allergies as of 07/22/2018      Reactions   Codeine Shortness Of Breath   Contrast Media [iodinated Diagnostic Agents] Other (See Comments)   Kidney shutdown      Medication List       Accurate as of July 22, 2018 11:25 AM. If you have any questions, ask your nurse or doctor.        STOP taking these medications   benzonatate 100  MG capsule Commonly known as: Best boy Stopped by: Dorita Sciara, MD   NovoLOG 100 UNIT/ML injection Generic drug: insulin aspart Stopped by: Dorita Sciara, MD     TAKE these medications   acetaminophen 650 MG CR tablet Commonly known as: TYLENOL Take 650-1,300 mg by mouth every 8 (eight) hours as needed for pain.   Basaglar KwikPen 100 UNIT/ML Sopn Inject 36 Units into the skin daily. What changed: Another medication with the same name was removed. Continue taking this medication, and follow the directions you see here. Changed by: Dorita Sciara, MD   gabapentin 100 MG capsule Commonly known as: NEURONTIN Take 200 mg by mouth 2 (two) times a day.    Jardiance 25 MG Tabs tablet Generic drug: empagliflozin Take 25 mg by mouth daily.   metFORMIN 500 MG tablet Commonly known as: GLUCOPHAGE Take 1,000 mg by mouth 2 (two) times daily with a meal.   sertraline 25 MG tablet Commonly known as: ZOLOFT Take 25 mg by mouth daily.   Ventolin HFA 108 (90 Base) MCG/ACT inhaler Generic drug: albuterol Inhale 2 puffs into the lungs every 6 (six) hours as needed for wheezing or shortness of breath.        ALLERGIES: Allergies  Allergen Reactions  . Codeine Shortness Of Breath  . Contrast Media [Iodinated Diagnostic Agents] Other (See Comments)    Kidney shutdown     REVIEW OF SYSTEMS: A comprehensive ROS was conducted with the patient and is negative except as per HPI and below:  Review of Systems  Constitutional: Negative for chills and fever.  HENT: Negative for congestion and sore throat.   Eyes: Negative for blurred vision and pain.  Respiratory: Negative for cough and shortness of breath.   Cardiovascular: Negative for chest pain and palpitations.  Gastrointestinal: Negative for diarrhea and nausea.  Genitourinary: Negative for frequency.  Skin: Negative.   Neurological: Positive for tingling.  Endo/Heme/Allergies: Negative for polydipsia.  Psychiatric/Behavioral: Positive for depression. The patient is nervous/anxious.       OBJECTIVE:   VITAL SIGNS: BP 108/80 (BP Location: Left Arm, Patient Position: Sitting, Cuff Size: Large)   Pulse 63   Temp 97.8 F (36.6 C) (Oral)   Ht 5' 6.75" (1.695 m)   Wt (!) 333 lb 12.8 oz (151.4 kg)   SpO2 99%   BMI 52.67 kg/m    PHYSICAL EXAM:  General: Pt appears well and is in NAD  Hydration: Well-hydrated with moist mucous membranes and good skin turgor  HEENT: Head: Unremarkable with good dentition. Oropharynx clear without exudate.  Eyes: External eye exam normal without stare, lid lag or exophthalmos.  EOM intact.  PERRL.  Neck: General: Supple without adenopathy or carotid  bruits. Thyroid: Thyroid size normal.  No goiter or nodules appreciated. No thyroid bruit.  Lungs: Clear with good BS bilat with no rales, rhonchi, or wheezes  Heart: RRR with normal S1 and S2 and no gallops; no murmurs; no rub  Abdomen: Normoactive bowel sounds, soft, nontender, without masses or organomegaly palpable  Extremities:  Lower extremities - No pretibial edema. No lesions.  Skin: Normal texture and temperature to palpation. No rash noted. No Acanthosis nigricans/skin tags. No lipohypertrophy.  Neuro: MS is good with appropriate affect, pt is alert and Ox3    DM foot exam: 07/22/2018  The skin of the feet is intact without sores or ulcerations. The pedal pulses are 2+ on right and 2+ on left. The sensation is absent on the right ,  decreased on the left  to a screening 5.07, 10 gram monofilament.   DATA REVIEWED: 11/05/2017  A1c 10.4 %  Cr. 0.67  LDL 96            ASSESSMENT / PLAN / RECOMMENDATIONS:   1) Type 2 Diabetes Mellitus, Poorly controlled, With neuropathic complications - Most recent A1c of 10.8 %. Goal A1c < 7.0 %.    Plan: GENERAL:  Poorly controlled diabetes is due to continued dietary indiscretions I have discussed with the patient the pathophysiology of diabetes. We went over the natural progression of the disease. We talked about both insulin resistance and insulin deficiency. We stressed the importance of lifestyle changes including diet and exercise. I explained the complications associated with diabetes including retinopathy, nephropathy, neuropathy as well as increased risk of cardiovascular disease. We went over the benefit seen with glycemic control.   I explained to the patient that diabetic patients are at higher than normal risk for amputations. The patient was informed that diabetes is the number one cause of non-traumatic amputations in [place].  Discussed pharmacokinetics of basal/bolus insulin and the importance of taking prandial insulin  with meals.   We also discussed avoiding sugar-sweetened beverages and snacks, when possible.   She will stay off Ozempic and prandial insulin at this time   MEDICATIONS: - Increase Basaglar to 36 units daily  - Continue Metformin 4 tablets daily  - Continue Jardiance 25 mg daily  EDUCATION / INSTRUCTIONS:  BG monitoring instructions: Patient is instructed to check her blood sugars 2 times a day, fasting and bedtime.  Call  Endocrinology clinic if: BG persistently < 70 or > 300. . I reviewed the Rule of 15 for the treatment of hypoglycemia in detail with the patient. Literature supplied.   2) Diabetic complications:   Eye: Does not have known diabetic retinopathy.   Neuro/ Feet: Does have known diabetic peripheral neuropathy.  Renal: Patient does not have known baseline CKD. She is not on an ACEI/ARB at present.Check urine albumin/creatinine ratio yearly. If albuminuria is positive, treatment is geared toward better glucose, blood pressure control and use of ACE inhibitors or ARBs. Monitor electrolytes and creatinine once to twice yearly.   3) Lipids: Patient is not on a statin. Per ADA recommendations, diabetics between the ages 2640-75 need to be on a statin , will defer this to PCP      F/u in 8 weeks     Signed electronically by: Lyndle HerrlichAbby Jaralla Jama Krichbaum, MD  [facility]  [facility] [address] [place], [place] [address] Phone: [phone] FAX: [phone]   CC: Verlon AuBoyd, Tammy Lamonica, MD [address] [place] [address] Phone: [phone]  Fax: [phone]    Return to Endocrinology clinic as below: Future Appointments  Date Time Provider Department Center  07/24/2018  9:00 AM Bonnita LevanJobe, Laura L, RD [facility]  09/16/2018  9:10 AM Marcel Sorter, Konrad DoloresIbtehal Jaralla, MD [facility] None

## 2018-07-22 NOTE — Patient Instructions (Addendum)
-   Continue Metformin 4 tablets daily  - Continue Jardiance 25 mg daily  - Increase basaglar to 36 units daily   Choose healthy, lower carb lower calorie snacks: toss salad, cooked vegetables, cottage cheese, peanut butter, low fat cheese / string cheese, lower sodium deli meat, tuna salad or chicken salad     HOW TO TREAT LOW BLOOD SUGARS (Blood sugar LESS THAN 70 MG/DL)  Please follow the RULE OF 15 for the treatment of hypoglycemia treatment (when your (blood sugars are less than 70 mg/dL)    STEP 1: Take 15 grams of carbohydrates when your blood sugar is low, which includes:   3-4 GLUCOSE TABS  OR  3-4 OZ OF JUICE OR REGULAR SODA OR  ONE TUBE OF GLUCOSE GEL     STEP 2: RECHECK blood sugar in 15 MINUTES STEP 3: If your blood sugar is still low at the 15 minute recheck --> then, go back to STEP 1 and treat AGAIN with another 15 grams of carbohydrates.

## 2018-07-24 ENCOUNTER — Ambulatory Visit: Payer: PRIVATE HEALTH INSURANCE | Admitting: Dietician

## 2018-09-14 ENCOUNTER — Other Ambulatory Visit: Payer: Self-pay

## 2018-09-16 ENCOUNTER — Encounter: Payer: Self-pay | Admitting: Internal Medicine

## 2018-09-16 ENCOUNTER — Ambulatory Visit (INDEPENDENT_AMBULATORY_CARE_PROVIDER_SITE_OTHER): Payer: PRIVATE HEALTH INSURANCE | Admitting: Internal Medicine

## 2018-09-16 ENCOUNTER — Other Ambulatory Visit: Payer: Self-pay

## 2018-09-16 VITALS — BP 118/68 | HR 69 | Temp 98.0°F | Ht 67.0 in | Wt 337.4 lb

## 2018-09-16 DIAGNOSIS — Z794 Long term (current) use of insulin: Secondary | ICD-10-CM | POA: Diagnosis not present

## 2018-09-16 DIAGNOSIS — E1142 Type 2 diabetes mellitus with diabetic polyneuropathy: Secondary | ICD-10-CM | POA: Diagnosis not present

## 2018-09-16 LAB — GLUCOSE, POCT (MANUAL RESULT ENTRY): POC Glucose: 205 mg/dl — AB (ref 70–99)

## 2018-09-16 MED ORDER — OZEMPIC (0.25 OR 0.5 MG/DOSE) 2 MG/1.5ML ~~LOC~~ SOPN: 0.5000 mg | PEN_INJECTOR | SUBCUTANEOUS | 3 refills | Status: DC

## 2018-09-16 MED ORDER — TRESIBA FLEXTOUCH 100 UNIT/ML ~~LOC~~ SOPN: 32.0000 [IU] | PEN_INJECTOR | Freq: Every day | SUBCUTANEOUS | 6 refills | Status: DC

## 2018-09-16 NOTE — Patient Instructions (Signed)
-   Will switch Basaglar to [place] , it may be cheaper. Take it at 32  units daily ( if you are going to use Basaglar, also take it at 32 units daily )  - Start Ozempic at 0.25 mg weekly for 6 weeks, if no nausea or diarrhea, increase to 0.5 mg weekly  - Continue Metformin 4 tablets daily - Continue Jardiance 25 mg daily      HOW TO TREAT LOW BLOOD SUGARS (Blood sugar LESS THAN 70 MG/DL)  Please follow the RULE OF 15 for the treatment of hypoglycemia treatment (when your (blood sugars are less than 70 mg/dL)    STEP 1: Take 15 grams of carbohydrates when your blood sugar is low, which includes:   3-4 GLUCOSE TABS  OR  3-4 OZ OF JUICE OR REGULAR SODA OR  ONE TUBE OF GLUCOSE GEL     STEP 2: RECHECK blood sugar in 15 MINUTES STEP 3: If your blood sugar is still low at the 15 minute recheck --> then, go back to STEP 1 and treat AGAIN with another 15 grams of carbohydrates.

## 2018-09-16 NOTE — Progress Notes (Signed)
Name: [name]  Age/ Sex: 48 y.o., female   MRN/ DOB: [id number], 03/24/1970     PCP: Verlon AuBoyd, Tammy Lamonica, MD   Reason for Endocrinology Evaluation: Type 2 Diabetes Mellitus  Initial Endocrine Consultative Visit: 07/22/2018    PATIENT IDENTIFIER: [name] [name] is a 48 y.o. female with a past medical history of T2DM, Hx of acute renal failure S/P temporary dialysis in 2017 . The patient has followed with Endocrinology clinic since 07/22/2018 for consultative assistance with management of her diabetes.  DIABETIC HISTORY:  [name] was diagnosed with T2DM in ~ 2000. She was on Ozempic and Novolog but cost was prohibitive, Has been on insulin for years. Her hemoglobin A1c has ranged from 9.8 % in 2014, peaking at 12.9%  in 2011.  Works 3 pm to 11 PM . Wakes up 1 pm    On her initial visit to our clinic her A1c was 10.8%. She was on basal insulin, Metformin and Jardiance  SUBJECTIVE:   During the last visit (07/22/2018): A1c 10.8% , we increased basaglar dose, continued Metformin and Jardiance.   Today (09/16/2018): [name] is here for a 2 month follow up.  She checks her blood sugars 3 times daily, preprandial. The patient has  had hypoglycemic episodes since the last clinic visit.. Otherwise, the patient has not required any recent emergency interventions for hypoglycemia and has not had recent hospitalizations secondary to hyper or hypoglycemic episodes.   Has changed her snacks   Found to have ozempic at home    ROS: As per HPI and as detailed below: Review of Systems  Gastrointestinal: Negative for abdominal pain and nausea.  Genitourinary: Negative for frequency.  Endo/Heme/Allergies: Negative for polydipsia.      HOME DIABETES REGIMEN:  Basaglar to 36 units daily  Metformin 4 tablets daily  Jardiance 25 mg daily    METER DOWNLOAD SUMMARY: Did not bring   During the afternoon 120's Evening glucose 180's   Morning glucose  200's         HISTORY:  Past Medical History:  Past Medical History:  Diagnosis Date  . Acute renal failure (ARF) (HCC)   . Allergy   . Depression   . Diabetes mellitus   . Morbid obesity (HCC)    Past Surgical History:  Past Surgical History:  Procedure Laterality Date  . INCISION AND DRAINAGE ABSCESS N/A 10/21/2015   Procedure: dressing change to  PRESACRAL ABCESS;  Surgeon: Romie LeveeAlicia Thomas, MD;  Location: [facility];  Service: General;  Laterality: N/A;  dressing to sacral area  . IR GENERIC HISTORICAL  10/27/2015   IR FLUORO GUIDE CV LINE RIGHT 10/27/2015 Gilmer MorJaime Wagner, DO [facility]  . IR GENERIC HISTORICAL  10/27/2015   IR US GUIDE VASC ACCESS RIGHT 10/27/2015 Gilmer MorJaime Wagner, DO [facility]  . IRRIGATION AND DEBRIDEMENT ABSCESS N/A 10/19/2015   Procedure: IRRIGATION AND DEBRIDEMENT PRESACRAL ABSCESS;  Surgeon: Claud KelpHaywood Ingram, MD;  Location: [facility];  Service: General;  Laterality: N/A;  . TUBAL LIGATION      Social History:  reports that she has never smoked. She has never used smokeless tobacco. She reports that she does not drink alcohol or use drugs. Family History:  Family History  Problem Relation Age of Onset  . Diabetes Mother   . Lymphoma Father   . Lymphoma Brother      HOME MEDICATIONS: Allergies as of 09/16/2018      Reactions   Codeine Shortness Of Breath   Contrast  Media [iodinated Diagnostic Agents] Other (See Comments)   Kidney shutdown      Medication List       Accurate as of September 16, 2018 11:26 AM. If you have any questions, ask your nurse or doctor.        STOP taking these medications   Basaglar KwikPen 100 UNIT/ML Sopn Stopped by: Dorita Sciara, MD     TAKE these medications   acetaminophen 650 MG CR tablet Commonly known as: TYLENOL Take 650-1,300 mg by mouth every 8 (eight) hours as needed for pain.   gabapentin 100 MG capsule Commonly known as:  NEURONTIN Take 200 mg by mouth 2 (two) times a day.   Jardiance 25 MG Tabs tablet Generic drug: empagliflozin Take 25 mg by mouth daily.   metFORMIN 500 MG tablet Commonly known as: GLUCOPHAGE Take 1,000 mg by mouth 2 (two) times daily with a meal.   Ozempic (0.25 or 0.5 MG/DOSE) 2 MG/1.5ML Sopn Generic drug: Semaglutide(0.25 or 0.5MG /DOS) Inject 0.5 mg into the skin once a week. Started by: Dorita Sciara, MD   sertraline 25 MG tablet Commonly known as: ZOLOFT Take 25 mg by mouth daily.   Tyler Aas FlexTouch 100 UNIT/ML Sopn FlexTouch Pen Generic drug: insulin degludec Inject 0.32 mLs (32 Units total) into the skin daily. To replace basaglar if cheaper with the coupon Started by: Dorita Sciara, MD   Ventolin HFA 108 (90 Base) MCG/ACT inhaler Generic drug: albuterol Inhale 2 puffs into the lungs every 6 (six) hours as needed for wheezing or shortness of breath.        OBJECTIVE:   Vital Signs: BP 118/68 (BP Location: Left Arm, Patient Position: Sitting, Cuff Size: Large)   Pulse 69   Temp 98 F (36.7 Price)   Ht 5\' 7"  (1.702 m)   Wt (!) 337 lb 6.4 oz (153 kg)   SpO2 99%   BMI 52.84 kg/m   Wt Readings from Last 3 Encounters:  09/16/18 (!) 337 lb 6.4 oz (153 kg)  07/22/18 (!) 333 lb 12.8 oz (151.4 kg)  05/05/17 (!) 332 lb (150.6 kg)     Exam: General: Pt appears well and is in NAD  Neck: General: Supple without adenopathy. Thyroid: Thyroid size normal.  No goiter or nodules appreciated. No thyroid bruit.  Lungs: Clear with good BS bilat with no rales, rhonchi, or wheezes  Heart: RRR with normal S1 and S2 and no gallops; no murmurs; no rub  Abdomen: Normoactive bowel sounds, soft, nontender, without masses or organomegaly palpable  Extremities: No pretibial edema. No tremor.  Skin: Normal texture and temperature to palpation.   Neuro: MS is good with appropriate affect, pt is alert and Ox3       DM foot exam: 07/22/2018  The skin of the feet is  intact without sores or ulcerations. The pedal pulses are 2+ on right and 2+ on left. The sensation is absent on the right , decreased on the left  to a screening 5.07, 10 gram monofilament.       DATA REVIEWED:  Lab Results  Component Value Date   HGBA1C 10.8 (A) 07/22/2018   HGBA1C 11.7 (H) 10/19/2015   HGBA1C 11.1 (H) 05/05/2013   Lab Results  Component Value Date   LDLCALC 73 07/14/2012   CREATININE 0.67 05/08/2017   Lab Results  Component Value Date   MICRALBCREAT <4.1 09/08/2012     Lab Results  Component Value Date   CHOL 179 07/14/2012   HDL  95 07/14/2012   LDLCALC 73 07/14/2012   TRIG 57 07/14/2012   CHOLHDL 1.9 07/14/2012       In-Office BG 205 mg/dL   ASSESSMENT / PLAN / RECOMMENDATIONS:   1) Type 2 Diabetes Mellitus, Poorly controlled, With neuropathic complications - Most recent A1c of 10.8 %. Goal A1c < 7.0 %.  - Pt did not bring a meter and is not due for repeat A1c yet. Her glucose readings based on memory recall seem to be improving, in the office her BG was 205 mg/dL, she drank Vitamin water only, she is not sure if it has glucose, otherwise she has been only drinking diet drinks and water, she also changed her snacks in to sugar-free options,I have advised her to be careful with this as sugar-free will contain other form of carbohydrates and will increase her glucose. - She also found that she has Ozempic left over and would like to start using it.  - Basaglar costs her $85 for a box, will see if Evaristo Buryresiba will be a better price with a coupon that was provided today.  - She was also provided with a coupon for Ozempic   MEDICATIONS:  - Reduce Basal Insulin (tresiba/basaglar) to 32 units daily  - Start Ozempic 0.25 mg weekly for 6 weeks, if no GI side effects, she could increase to 0.5 mg weekly - Continue Metformin 500 mg 4 tabs daily - Continue Jardiance 25 mg daily   EDUCATION / INSTRUCTIONS:  BG monitoring instructions: Patient is instructed to  check her blood sugars 2 times a day, before breakfast and Supper.  Call [facility] if: BG persistently < 70 or > 300. . I reviewed the Rule of 15 for the treatment of hypoglycemia in detail with the patient. Literature supplied.     F/U in 3 months    Signed electronically by: Lyndle HerrlichAbby Jaralla Rane Dumm, MD  [facility]  [facility] [address] [place], [place] [address] Phone: [phone] FAX: [phone]   CC: Verlon AuBoyd, Tammy Lamonica, MD [address]  [place] [address] Phone: [phone]  Fax: [phone]  Return to Endocrinology clinic as below: Future Appointments  Date Time Provider Department Center  12/16/2018  9:10 AM Jacquese Cassarino, Konrad DoloresIbtehal Jaralla, MD [facility] None

## 2018-09-17 ENCOUNTER — Other Ambulatory Visit: Payer: Self-pay | Admitting: Internal Medicine

## 2018-09-17 MED ORDER — TRULICITY 1.5 MG/0.5ML ~~LOC~~ SOAJ: 1.5000 mg | SUBCUTANEOUS | 6 refills | Status: DC

## 2018-09-17 MED ORDER — FARXIGA 10 MG PO TABS: 10.0000 mg | ORAL_TABLET | Freq: Every day | ORAL | 6 refills | Status: DC

## 2018-09-17 NOTE — Progress Notes (Signed)
INsurance won't cover ozempic nor jardiance  Will try trulicity and farxiga

## 2018-09-18 ENCOUNTER — Telehealth: Payer: Self-pay

## 2018-09-18 ENCOUNTER — Other Ambulatory Visit: Payer: Self-pay

## 2018-09-18 MED ORDER — JARDIANCE 25 MG PO TABS: 25.0000 mg | ORAL_TABLET | Freq: Every day | ORAL | 1 refills | Status: DC

## 2018-09-18 NOTE — Telephone Encounter (Signed)
Insurance will not cover both jardiance and ozempic. Dr. Kelton Pillar would like jardicance and trulicity so I will complete a PA for trulicity and added jardiance to med list and d/c'ed ozempic.

## 2018-09-18 NOTE — Telephone Encounter (Signed)
PA for trulicity started, last OV notes and A1C faxed to [phone] was informed that it will take 2-5 business days for approval/denial

## 2018-09-30 ENCOUNTER — Telehealth: Payer: Self-pay

## 2018-09-30 NOTE — Telephone Encounter (Signed)
PA for trulicity approved until 10/04/2019 via [employer] benefits

## 2018-10-15 ENCOUNTER — Other Ambulatory Visit: Payer: Self-pay

## 2018-10-15 ENCOUNTER — Telehealth: Payer: Self-pay | Admitting: Internal Medicine

## 2018-10-15 MED ORDER — TRESIBA FLEXTOUCH 100 UNIT/ML ~~LOC~~ SOPN: 32.0000 [IU] | PEN_INJECTOR | Freq: Every day | SUBCUTANEOUS | 6 refills | Status: DC

## 2018-10-15 NOTE — Telephone Encounter (Signed)
Patient is calling for you, states she is returning your call  Her number for a return call is [phone]

## 2018-10-15 NOTE — Telephone Encounter (Signed)
Spoke to pt to clarify medications that are being taken, pt currently taking metformin,jardiance,tresiba and trulicity. Contacted pharmacy to have previous medications D/c'ed (ozempic,farxiga,basaglar)

## 2018-11-05 ENCOUNTER — Ambulatory Visit
Admission: RE | Admit: 2018-11-05 | Discharge: 2018-11-05 | Disposition: A | Discharge status: Home / Self Care | Payer: PRIVATE HEALTH INSURANCE | Source: Ambulatory Visit | Attending: Family Medicine | Admitting: Family Medicine

## 2018-11-05 ENCOUNTER — Other Ambulatory Visit: Payer: Self-pay | Admitting: Family Medicine

## 2018-11-05 DIAGNOSIS — M25561 Pain in right knee: Secondary | ICD-10-CM

## 2018-11-05 DIAGNOSIS — G8929 Other chronic pain: Secondary | ICD-10-CM

## 2018-11-05 DIAGNOSIS — M170 Bilateral primary osteoarthritis of knee: Secondary | ICD-10-CM

## 2018-12-16 ENCOUNTER — Ambulatory Visit: Payer: PRIVATE HEALTH INSURANCE | Admitting: Internal Medicine

## 2019-02-16 ENCOUNTER — Telehealth: Payer: Self-pay

## 2019-02-16 NOTE — Telephone Encounter (Signed)
There is not a recent PA in pt chart so I am not sure what a PA is needed or has been placed for.

## 2019-02-16 NOTE — Telephone Encounter (Signed)
He didn't give me the names of the medications he just said that because it's a new year that her medications that needed PA would need new ones this year

## 2019-02-16 NOTE — Telephone Encounter (Signed)
Spoke to [place] who works with [facility] and they needed PA'S for trulicity,tresiba,jardiance and metformin. However after checking pt chart she has not been seen since 09/16/2018 so scheduled pt follow up appt and told Heidi and pt that medications and PA's will be addressed after that appointment.

## 2019-02-16 NOTE — Telephone Encounter (Signed)
[facility] called in needing OV notes for patients PA. Most recent Office Visit Notes and most recent A1C done also.    Fax Number: [phone]

## 2019-02-19 ENCOUNTER — Other Ambulatory Visit: Payer: Self-pay

## 2019-02-22 NOTE — Progress Notes (Deleted)
Name: [name]  Age/ Sex: 49 y.o., female   MRN/ DOB: [id number], May 26, 1970     PCP: Verlon Au, MD   Reason for Endocrinology Evaluation: Type 2 Diabetes Mellitus  Initial Endocrine Consultative Visit: 07/22/2018    PATIENT IDENTIFIER: [name] is a 49 y.o. female with a past medical history of T2DM, Hx of acute renal failure S/P temporary dialysis in 2017 . The patient has followed with Endocrinology clinic since 07/22/2018 for consultative assistance with management of her diabetes.  DIABETIC HISTORY:  [name] was diagnosed with T2DM in ~ 2000. She was on Ozempic and Novolog but cost was prohibitive, Has been on insulin for years. Her hemoglobin A1c has ranged from 9.8 % in 2014, peaking at 12.9%  in 2011.  Works 3 pm to 11 PM . Wakes up 1 pm    On her initial visit to our clinic her A1c was 10.8%. She was on basal insulin, Metformin and Jardiance  SUBJECTIVE:   During the last visit (09/16/2018): A1c 10.8% , we reduced basaglar dose,started Ozempic,  continued Metformin and Jardiance.   Today (02/22/2019): [name] is here for a  follow up on her diabetes management.  She checks her blood sugars 3 times daily, preprandial. The patient has  had hypoglycemic episodes since the last clinic visit.. Otherwise, the patient has not required any recent emergency interventions for hypoglycemia and has not had recent hospitalizations secondary to hyper or hypoglycemic episodes.     ROS: As per HPI and as detailed below: Review of Systems  Gastrointestinal: Negative for abdominal pain and nausea.  Genitourinary: Negative for frequency.  Endo/Heme/Allergies: Negative for polydipsia.      HOME DIABETES REGIMEN:  Basaglar 32 units daily  Metformin 500 mg, 4 tablets daily  Jardiance 25 mg daily Ozempic 0.5 mg weekly     METER DOWNLOAD SUMMARY: Did not bring    During the afternoon 120's Evening glucose 180's   Morning glucose  200's         HISTORY:  Past Medical History:  Past Medical History:  Diagnosis Date  . Acute renal failure (ARF) (HCC)   . Allergy   . Depression   . Diabetes mellitus   . Morbid obesity (HCC)    Past Surgical History:  Past Surgical History:  Procedure Laterality Date  . INCISION AND DRAINAGE ABSCESS N/A 10/21/2015   Procedure: dressing change to  PRESACRAL ABCESS;  Surgeon: Romie Levee, MD;  Location: [facility];  Service: General;  Laterality: N/A;  dressing to sacral area  . IR GENERIC HISTORICAL  10/27/2015   IR FLUORO GUIDE CV LINE RIGHT 10/27/2015 Gilmer Mor, DO [facility]  . IR GENERIC HISTORICAL  10/27/2015   IR US GUIDE VASC ACCESS RIGHT 10/27/2015 Gilmer Mor, DO [facility]  . IRRIGATION AND DEBRIDEMENT ABSCESS N/A 10/19/2015   Procedure: IRRIGATION AND DEBRIDEMENT PRESACRAL ABSCESS;  Surgeon: Claud Kelp, MD;  Location: [facility];  Service: General;  Laterality: N/A;  . TUBAL LIGATION      Social History:  reports that she has never smoked. She has never used smokeless tobacco. She reports that she does not drink alcohol or use drugs. Family History:  Family History  Problem Relation Age of Onset  . Diabetes Mother   . Lymphoma Father   . Lymphoma Brother      HOME MEDICATIONS: Allergies as of 02/23/2019      Reactions   Codeine Shortness Of Breath   Contrast Media [  iodinated Diagnostic Agents] Other (See Comments)   Kidney shutdown      Medication List       Accurate as of February 22, 2019  1:43 PM. If you have any questions, ask your nurse or doctor.        acetaminophen 650 MG CR tablet Commonly known as: TYLENOL Take 650-1,300 mg by mouth every 8 (eight) hours as needed for pain.   gabapentin 100 MG capsule Commonly known as: NEURONTIN Take 200 mg by mouth 2 (two) times a day.   Jardiance 25 MG Tabs tablet Generic drug: empagliflozin Take 25 mg by mouth daily  before breakfast.   metFORMIN 500 MG tablet Commonly known as: GLUCOPHAGE Take 1,000 mg by mouth 2 (two) times daily with a meal.   sertraline 25 MG tablet Commonly known as: ZOLOFT Take 25 mg by mouth daily.   [name] FlexTouch 100 UNIT/ML Sopn FlexTouch Pen Generic drug: insulin degludec Inject 0.32 mLs (32 Units total) into the skin daily.   Trulicity 1.5 MG/0.5ML Sopn Generic drug: Dulaglutide Inject 1.5 mg into the skin once a week.   Ventolin HFA 108 (90 Base) MCG/ACT inhaler Generic drug: albuterol Inhale 2 puffs into the lungs every 6 (six) hours as needed for wheezing or shortness of breath.        OBJECTIVE:   Vital Signs: There were no vitals taken for this visit.  Wt Readings from Last 3 Encounters:  09/16/18 (!) 337 lb 6.4 oz (153 kg)  07/22/18 (!) 333 lb 12.8 oz (151.4 kg)  05/05/17 (!) 332 lb (150.6 kg)     Exam: General: Pt appears well and is in NAD  Neck: General: Supple without adenopathy. Thyroid: Thyroid size normal.  No goiter or nodules appreciated. No thyroid bruit.  Lungs: Clear with good BS bilat with no rales, rhonchi, or wheezes  Heart: RRR with normal S1 and S2 and no gallops; no murmurs; no rub  Abdomen: Normoactive bowel sounds, soft, nontender, without masses or organomegaly palpable  Extremities: No pretibial edema. No tremor.  Skin: Normal texture and temperature to palpation.   Neuro: MS is good with appropriate affect, pt is alert and Ox3       DM foot exam: 07/22/2018  The skin of the feet is intact without sores or ulcerations. The pedal pulses are 2+ on right and 2+ on left. The sensation is absent on the right , decreased on the left  to a screening 5.07, 10 gram monofilament.       DATA REVIEWED:  Lab Results  Component Value Date   HGBA1C 10.8 (A) 07/22/2018   HGBA1C 11.7 (H) 10/19/2015   HGBA1C 11.1 (H) 05/05/2013   Lab Results  Component Value Date   LDLCALC 73 07/14/2012   CREATININE 0.67 05/08/2017    Lab Results  Component Value Date   MICRALBCREAT <4.1 09/08/2012     Lab Results  Component Value Date   CHOL 179 07/14/2012   HDL 95 07/14/2012   LDLCALC 73 07/14/2012   TRIG 57 07/14/2012   CHOLHDL 1.9 07/14/2012       In-Office BG 205 mg/dL   ASSESSMENT / PLAN / RECOMMENDATIONS:   1) Type 2 Diabetes Mellitus, Poorly controlled, With neuropathic complications - Most recent A1c of 10.8 %. Goal A1c < 7.0 %.  - Pt did not bring a meter and is not due for repeat A1c yet. Her glucose readings based on memory recall seem to be improving, in the office her BG was 205  mg/dL, she drank Vitamin water only, she is not sure if it has glucose, otherwise she has been only drinking diet drinks and water, she also changed her snacks in to sugar-free options,I have advised her to be careful with this as sugar-free will contain other form of carbohydrates and will increase her glucose. - She also found that she has Ozempic left over and would like to start using it.  - Basaglar costs her $85 for a box, will see if Tyler Aas will be a better price with a coupon that was provided today.  - She was also provided with a coupon for Ozempic   MEDICATIONS:  - Reduce Basal Insulin (tresiba/basaglar) to 32 units daily  - Start Ozempic 0.25 mg weekly for 6 weeks, if no GI side effects, she could increase to 0.5 mg weekly - Continue Metformin 500 mg 4 tabs daily - Continue Jardiance 25 mg daily   EDUCATION / INSTRUCTIONS:  BG monitoring instructions: Patient is instructed to check her blood sugars 2 times a day, before breakfast and Supper.  Call [facility] if: BG persistently < 70 or > 300. . I reviewed the Rule of 15 for the treatment of hypoglycemia in detail with the patient. Literature supplied.     F/U in 3 months    Signed electronically by: Mack Guise, MD  [facility]  [facility] [address] [place], [place]  [address] Phone: [phone] FAX: [phone]   CC: [name], [place] [address] Phone: [phone]  Fax: [phone]  Return to Endocrinology clinic as below: Future Appointments  Date Time Provider [place]  02/23/2019  8:10 AM Nikitas Davtyan, Melanie Crazier, MD [facility] None

## 2019-02-23 ENCOUNTER — Ambulatory Visit: Payer: PRIVATE HEALTH INSURANCE | Admitting: Internal Medicine

## 2019-03-09 NOTE — Telephone Encounter (Signed)
Scheduled pt already see previous phone note.

## 2019-03-09 NOTE — Telephone Encounter (Signed)
Received fax from [facility] requesting last OV and most recent A1C for tresiba PA, please advise.

## 2019-03-09 NOTE — Telephone Encounter (Signed)
Spoke to [facility] and they stated that all medications will be denied due to pt not having any recent office notes.

## 2019-04-29 ENCOUNTER — Other Ambulatory Visit: Payer: Self-pay

## 2019-04-29 ENCOUNTER — Emergency Department
Admission: EM | Admit: 2019-04-29 | Discharge: 2019-04-29 | Disposition: A | Discharge status: Home / Self Care | Payer: No Typology Code available for payment source | Attending: Emergency Medicine | Admitting: Emergency Medicine

## 2019-04-29 DIAGNOSIS — Z794 Long term (current) use of insulin: Secondary | ICD-10-CM | POA: Diagnosis not present

## 2019-04-29 DIAGNOSIS — N390 Urinary tract infection, site not specified: Secondary | ICD-10-CM | POA: Insufficient documentation

## 2019-04-29 DIAGNOSIS — Z79899 Other long term (current) drug therapy: Secondary | ICD-10-CM | POA: Diagnosis not present

## 2019-04-29 DIAGNOSIS — R350 Frequency of micturition: Secondary | ICD-10-CM | POA: Diagnosis present

## 2019-04-29 DIAGNOSIS — E1165 Type 2 diabetes mellitus with hyperglycemia: Secondary | ICD-10-CM

## 2019-04-29 LAB — CBC WITH DIFFERENTIAL/PLATELET
Abs Immature Granulocytes: 0.03 10*3/uL (ref 0.00–0.07)
Basophils Absolute: 0 10*3/uL (ref 0.0–0.1)
Basophils Relative: 0 %
Eosinophils Absolute: 0 10*3/uL (ref 0.0–0.5)
Eosinophils Relative: 0 %
HCT: 35.4 % — ABNORMAL LOW (ref 36.0–46.0)
Hemoglobin: 11.8 g/dL — ABNORMAL LOW (ref 12.0–15.0)
Immature Granulocytes: 0 %
Lymphocytes Relative: 18 %
Lymphs Abs: 1.5 10*3/uL (ref 0.7–4.0)
MCH: 28.2 pg (ref 26.0–34.0)
MCHC: 33.3 g/dL (ref 30.0–36.0)
MCV: 84.7 fL (ref 80.0–100.0)
Monocytes Absolute: 0.6 10*3/uL (ref 0.1–1.0)
Monocytes Relative: 7 %
Neutro Abs: 6.4 10*3/uL (ref 1.7–7.7)
Neutrophils Relative %: 75 %
Platelets: 189 10*3/uL (ref 150–400)
RBC: 4.18 MIL/uL (ref 3.87–5.11)
RDW: 12.2 % (ref 11.5–15.5)
WBC: 8.6 10*3/uL (ref 4.0–10.5)
nRBC: 0 % (ref 0.0–0.2)

## 2019-04-29 LAB — COMPREHENSIVE METABOLIC PANEL
ALT: 25 U/L (ref 0–44)
AST: 25 U/L (ref 15–41)
Albumin: 3.1 g/dL — ABNORMAL LOW (ref 3.5–5.0)
Alkaline Phosphatase: 160 U/L — ABNORMAL HIGH (ref 38–126)
Anion gap: 9 (ref 5–15)
BUN: 16 mg/dL (ref 6–20)
CO2: 25 mmol/L (ref 22–32)
Calcium: 9.3 mg/dL (ref 8.9–10.3)
Chloride: 98 mmol/L (ref 98–111)
Creatinine, Ser: 0.81 mg/dL (ref 0.44–1.00)
GFR calc Af Amer: >60 mL/min (ref >60)
GFR calc non Af Amer: >60 mL/min (ref >60)
Glucose, Bld: 357 mg/dL — ABNORMAL HIGH (ref 70–99)
Potassium: 4 mmol/L (ref 3.5–5.1)
Sodium: 132 mmol/L — ABNORMAL LOW (ref 135–145)
Total Bilirubin: 1 mg/dL (ref 0.3–1.2)
Total Protein: 8.7 g/dL — ABNORMAL HIGH (ref 6.5–8.1)

## 2019-04-29 LAB — URINALYSIS, COMPLETE (UACMP) WITH MICROSCOPIC
Bilirubin Urine: NEGATIVE
Glucose, UA: >=500 mg/dL — AB
Ketones, ur: NEGATIVE mg/dL
Nitrite: POSITIVE — AB
Protein, ur: 100 mg/dL — AB
RBC / HPF: >50 RBC/hpf (ref 0–5)
Specific Gravity, Urine: 1.024 (ref 1.005–1.030)
Squamous Epithelial / LPF: NONE SEEN (ref 0–5)
WBC, UA: >50 WBC/hpf (ref 0–5)
pH: 6 (ref 5.0–8.0)

## 2019-04-29 MED ORDER — FLUCONAZOLE 150 MG PO TABS: 150.0000 mg | ORAL_TABLET | Freq: Every day | ORAL | 0 refills | Status: DC

## 2019-04-29 MED ORDER — SODIUM CHLORIDE 0.9 % IV SOLN
1.0000 g | Freq: Once | INTRAVENOUS | Status: AC
  Administered 2019-04-29: 1 g via INTRAVENOUS
  Filled 2019-04-29: qty 10

## 2019-04-29 MED ORDER — CEPHALEXIN 500 MG PO CAPS: 500.0000 mg | ORAL_CAPSULE | Freq: Three times a day (TID) | ORAL | 0 refills | Status: DC

## 2019-04-29 NOTE — Discharge Instructions (Addendum)
Call make an appointment with your primary care provider to recheck your urine and also better control of your diabetes.  Today in the emergency department your blood sugar was 357.  Discontinue drinking regular sodas as they contain a a lot of sugar.  Read over the information about diabetic nutrition.  Take all of the medication for your urinary tract infection.  Also a pill (Diflucan) was sent to the pharmacy should you develop a yeast infection from the antibiotics.

## 2019-04-29 NOTE — ED Notes (Signed)
Pt informed provider that she has has chills and some nausea    Denies any fever and is currently afebrile

## 2019-04-29 NOTE — ED Triage Notes (Signed)
Pt c/o increased urinary frequency with painful urination for the past 2 days, states she has been taking cranberry pills and juice trying to self medicate .

## 2019-04-29 NOTE — ED Notes (Signed)
See triage note  Presents with some urinary freq and pain since Tuesday

## 2019-04-29 NOTE — ED Provider Notes (Signed)
[facility] Emergency Department Provider Note  ____________________________________________   First MD Initiated Contact with Patient 04/29/19 [phone]     (approximate)  I have reviewed the triage vital signs and the nursing notes.   HISTORY  Chief Complaint Urinary Frequency   HPI [name] is a 49 y.o. female presents to the ED with complaint of urinary frequency and suprapubic pain for the last 2 days.  Patient states that she has been taking cranberry pills and cranberry juice trying to prevent a urinary tract infection.  Patient denies any fever but reports chills with some nausea without vomiting.  Patient also states that she has a history of diabetes but has been drinking regular soft drinks for some time.  She is unaware of what her blood sugar has been running.  She rates her pain as an 8 out of 10.       Past Medical History:  Diagnosis Date  . Acute renal failure (ARF) (HCC)   . Allergy   . Depression   . Diabetes mellitus   . Morbid obesity [facility])     Patient Active Problem List   Diagnosis Date Noted  . ATN (acute tubular necrosis) (HCC) 10/31/2015  . Cellulitis 10/19/2015  . Necrotizing soft tissue infection 10/19/2015  . Acute sinus infection 07/07/2013  . Gall stones 07/07/2013  . Osteoarthritis 03/09/2013  . Transaminitis 01/26/2013  . Depression 01/26/2013  . DM type 2 goal A1C below 7.5 01/26/2013  . Diabetes mellitus (HCC) 07/15/2012  . Disorder of bone and cartilage 07/15/2012  . Morbid obesity (HCC) 07/15/2012  . Other malaise and fatigue 07/15/2012    Past Surgical History:  Procedure Laterality Date  . INCISION AND DRAINAGE ABSCESS N/A 10/21/2015   Procedure: dressing change to  PRESACRAL ABCESS;  Surgeon: Romie Levee, MD;  Location: [facility];  Service: General;  Laterality: N/A;  dressing to sacral area  . IR GENERIC HISTORICAL  10/27/2015   IR FLUORO GUIDE CV LINE RIGHT 10/27/2015 Gilmer Mor, DO [facility]-INTERV  RAD  . IR GENERIC HISTORICAL  10/27/2015   IR US GUIDE VASC ACCESS RIGHT 10/27/2015 Gilmer Mor, DO [facility]  . IRRIGATION AND DEBRIDEMENT ABSCESS N/A 10/19/2015   Procedure: IRRIGATION AND DEBRIDEMENT PRESACRAL ABSCESS;  Surgeon: Claud Kelp, MD;  Location: [facility];  Service: General;  Laterality: N/A;  . TUBAL LIGATION      Prior to Admission medications   Medication Sig Start Date End Date Taking? Authorizing Provider  acetaminophen (TYLENOL) 650 MG CR tablet Take 650-1,300 mg by mouth every 8 (eight) hours as needed for pain.    [provider]  albuterol (VENTOLIN HFA) 108 (90 Base) MCG/ACT inhaler Inhale 2 puffs into the lungs every 6 (six) hours as needed for wheezing or shortness of breath.  06/15/15 06/14/16  [provider]  cephALEXin (KEFLEX) 500 MG capsule Take 1 capsule (500 mg total) by mouth 3 (three) times daily. 04/29/19   Tommi Rumps, PA-C  Dulaglutide (TRULICITY) 1.5 MG/0.5ML SOPN Inject 1.5 mg into the skin once a week. 09/17/18   Shamleffer, Konrad Dolores, MD  empagliflozin (JARDIANCE) 25 MG TABS tablet Take 25 mg by mouth daily before breakfast. 09/18/18   Shamleffer, Konrad Dolores, MD  fluconazole (DIFLUCAN) 150 MG tablet Take 1 tablet (150 mg total) by mouth daily. If needed for yeast infection 04/29/19   Bridget Hartshorn L, PA-C  gabapentin (NEURONTIN) 100 MG capsule Take 200 mg by mouth 2 (two) times a day.  [provider]  insulin degludec (TRESIBA FLEXTOUCH) 100 UNIT/ML SOPN FlexTouch Pen Inject 0.32 mLs (32 Units total) into the skin daily. 10/15/18   Shamleffer, Konrad Dolores, MD  metFORMIN (GLUCOPHAGE) 500 MG tablet Take 1,000 mg by mouth 2 (two) times daily with a meal.     [provider]  sertraline (ZOLOFT) 25 MG tablet Take 25 mg by mouth daily.    [provider]    Allergies Codeine and Contrast media [iodinated diagnostic agents]  Family History  Problem Relation Age of Onset  . Diabetes Mother    . Lymphoma Father   . Lymphoma Brother     Social History Social History   Tobacco Use  . Smoking status: Never Smoker  . Smokeless tobacco: Never Used  Substance Use Topics  . Alcohol use: No  . Drug use: No    Review of Systems Constitutional: No fever/positive chills Eyes: No visual changes. ENT: No sore throat. Cardiovascular: Denies chest pain. Respiratory: Denies shortness of breath. Gastrointestinal: Positive suprapubic pain.  Positive nausea, no vomiting.  No diarrhea.  No constipation. Genitourinary: Positive dysuria. Musculoskeletal: Negative for back pain. Skin: Negative for rash. Neurological: Negative for headaches, focal weakness or numbness. ____________________________________________   PHYSICAL EXAM:  VITAL SIGNS: ED Triage Vitals  Enc Vitals Group     BP 04/29/19 0952 116/64     Pulse Rate 04/29/19 0952 83     Resp 04/29/19 0952 17     Temp 04/29/19 0952 98.4 F (36.9 C)     Temp Source 04/29/19 0952 Oral     SpO2 04/29/19 0952 95 %     Weight 04/29/19 0947 (!) 320 lb (145.2 kg)     Height 04/29/19 0947 5\' 7"  (1.702 m)     Head Circumference --      Peak Flow --      Pain Score 04/29/19 0946 8     Pain Loc --      Pain Edu? --      Excl. in GC? --     Constitutional: Alert and oriented. Well appearing and in no acute distress. Eyes: Conjunctivae are normal. PERRL. EOMI. Head: Atraumatic. Neck: No stridor.   Cardiovascular: Normal rate, regular rhythm. Grossly normal heart sounds.  Good peripheral circulation. Respiratory: Normal respiratory effort.  No retractions. Lungs CTAB. Gastrointestinal: Soft and nontender. No distention.  No CVA tenderness. Musculoskeletal: Moves upper and lower extremities without any difficulty.  Neurologic:  Normal speech and language. No gross focal neurologic deficits are appreciated.  Skin:  Skin is warm, dry and intact. No rash noted. Psychiatric: Mood and affect are normal. Speech and behavior are  normal.  ____________________________________________   LABS (all labs ordered are listed, but only abnormal results are displayed)  Labs Reviewed  URINALYSIS, COMPLETE (UACMP) WITH MICROSCOPIC - Abnormal; Notable for the following components:      Result Value   Color, Urine YELLOW (*)    APPearance TURBID (*)    Glucose, UA >=500 (*)    Hgb urine dipstick LARGE (*)    Protein, ur 100 (*)    Nitrite POSITIVE (*)    Leukocytes,Ua MODERATE (*)    Bacteria, UA FEW (*)    All other components within normal limits  CBC WITH DIFFERENTIAL/PLATELET - Abnormal; Notable for the following components:   Hemoglobin 11.8 (*)    HCT 35.4 (*)    All other components within normal limits  COMPREHENSIVE METABOLIC PANEL - Abnormal; Notable for the following components:  Sodium 132 (*)    Glucose, Bld 357 (*)    Total Protein 8.7 (*)    Albumin 3.1 (*)    Alkaline Phosphatase 160 (*)    All other components within normal limits  URINE CULTURE     PROCEDURES  Procedure(s) performed (including Critical Care):  Procedures   ____________________________________________   INITIAL IMPRESSION / ASSESSMENT AND PLAN / ED COURSE  As part of my medical decision making, I reviewed the following data within the electronic MEDICAL RECORD NUMBER Notes from prior ED visits and [place] Controlled Substance Database  49 year old female presents to the ED with complaint of chills with nausea but no vomiting.  She began noticing some urinary frequency 2 days ago and has been medicating herself with cranberry pills.  Patient has had urinary tract infections in the past.  She is unaware of any fever.  Urinalysis was consistent with a UTI.  Patient also has diabetes and has not been adhering to her diabetic diet.  Glucose was 357.  Patient admits she has been drinking a lot of regular sodas instead of diet soda and not watching what she is eating.  Patient was encouraged to follow-up with her PCP for recheck of her  urine as well as better management of her diabetes.  ____________________________________________   FINAL CLINICAL IMPRESSION(S) / ED DIAGNOSES  Final diagnoses:  Acute urinary tract infection  Poorly controlled diabetes mellitus [facility])     ED Discharge Orders         Ordered    cephALEXin (KEFLEX) 500 MG capsule  3 times daily     04/29/19 1229    fluconazole (DIFLUCAN) 150 MG tablet  Daily     04/29/19 1229           Note:  This document was prepared using Dragon voice recognition software and may include unintentional dictation errors.    Johnn Hai, PA-C 04/29/19 1233    Duffy Bruce, MD 04/30/19 1005

## 2019-05-01 LAB — URINE CULTURE
Culture: >=100000 — AB
Special Requests: NORMAL

## 2019-05-25 ENCOUNTER — Other Ambulatory Visit: Payer: Self-pay

## 2019-05-27 ENCOUNTER — Encounter: Payer: Self-pay | Admitting: Internal Medicine

## 2019-05-27 ENCOUNTER — Telehealth: Payer: Self-pay | Admitting: Internal Medicine

## 2019-05-27 ENCOUNTER — Ambulatory Visit (INDEPENDENT_AMBULATORY_CARE_PROVIDER_SITE_OTHER): Payer: PRIVATE HEALTH INSURANCE | Admitting: Internal Medicine

## 2019-05-27 ENCOUNTER — Other Ambulatory Visit: Payer: Self-pay

## 2019-05-27 VITALS — BP 118/68 | HR 71 | Temp 98.2°F | Ht 67.0 in | Wt 334.4 lb

## 2019-05-27 DIAGNOSIS — E1165 Type 2 diabetes mellitus with hyperglycemia: Secondary | ICD-10-CM

## 2019-05-27 DIAGNOSIS — E1142 Type 2 diabetes mellitus with diabetic polyneuropathy: Secondary | ICD-10-CM | POA: Diagnosis not present

## 2019-05-27 DIAGNOSIS — Z794 Long term (current) use of insulin: Secondary | ICD-10-CM | POA: Insufficient documentation

## 2019-05-27 MED ORDER — INSULIN PEN NEEDLE 31G X 5 MM MISC: 1.0000 | 11 refills | Status: DC

## 2019-05-27 MED ORDER — NOVOLIN R FLEXPEN 100 UNIT/ML IJ SOPN: 12.0000 [IU] | PEN_INJECTOR | Freq: Three times a day (TID) | INTRAMUSCULAR | 6 refills | Status: DC

## 2019-05-27 MED ORDER — METFORMIN HCL 500 MG PO TABS: 1000.0000 mg | ORAL_TABLET | Freq: Two times a day (BID) | ORAL | 3 refills | Status: DC

## 2019-05-27 MED ORDER — TRESIBA FLEXTOUCH 100 UNIT/ML ~~LOC~~ SOPN: 38.0000 [IU] | PEN_INJECTOR | Freq: Every day | SUBCUTANEOUS | 3 refills | Status: DC

## 2019-05-27 NOTE — Patient Instructions (Addendum)
-   Continue Metformin 500 mg 2 tablets twice a day - Increase Tresiba to 38 units daily  - Start Novolin- R 12 units BEFORE  each meal ( preferably 20-30 minutes before a meal)     HOW TO TREAT LOW BLOOD SUGARS (Blood sugar LESS THAN 70 MG/DL)  Please follow the RULE OF 15 for the treatment of hypoglycemia treatment (when your (blood sugars are less than 70 mg/dL)    STEP 1: Take 15 grams of carbohydrates when your blood sugar is low, which includes:   3-4 GLUCOSE TABS  OR  3-4 OZ OF JUICE OR REGULAR SODA OR  ONE TUBE OF GLUCOSE GEL     STEP 2: RECHECK blood sugar in 15 MINUTES STEP 3: If your blood sugar is still low at the 15 minute recheck --> then, go back to STEP 1 and treat AGAIN with another 15 grams of carbohydrates.

## 2019-05-27 NOTE — Telephone Encounter (Signed)
[name] with [employer] requests to be called at ph# [phone] (you may speak with whoever answers the phone) re: PA and Formulary for insulin RX's

## 2019-05-27 NOTE — Progress Notes (Signed)
Name: [name]  Age/ Sex: 49 y.o., female   MRN/ DOB: [id number], [birth year]     PCP: Verlon Au, MD   Reason for Endocrinology Evaluation: Type 2 Diabetes Mellitus  Initial Endocrine Consultative Visit: 07/22/2018    PATIENT IDENTIFIER: [name] is a 49 y.o. female with a past medical history of T2DM, Hx of acute renal failure S/P temporary dialysis in 2017 . The patient has followed with Endocrinology clinic since 07/22/2018 for consultative assistance with management of her diabetes.  DIABETIC HISTORY:  [name] was diagnosed with T2DM in ~ 2000. She was on Ozempic and Novolog but cost was prohibitive, Has been on insulin for years. Her hemoglobin A1c has ranged from 9.8 % in 2014, peaking at 12.9%  in 2011.  Works 3 pm to 11 PM . Wakes up 1 pm    On her initial visit to our clinic her A1c was 10.8%. She was on basal insulin, Metformin and Jardiance  SUBJECTIVE:   During the last visit (09/16/2018): A1c 10.8% , we reduced basal insulin,started Ozempic, continued Metformin and Jardiance.   Today (05/27/2019): [name] is here for a follow up on diabetes management.  She has not been to our office since 09/2018  She checks her blood sugars 2 times daily, preprandial. The patient has not had hypoglycemic episodes since the last clinic visit.Marland Kitchen    Pt presented to the ED 04/29/2019 with UTI.     ROS: As per HPI and as detailed below: Review of Systems  Gastrointestinal: Negative for abdominal pain and nausea.  Genitourinary: Negative for frequency.  Neurological: Positive for tingling.  Endo/Heme/Allergies: Negative for polydipsia.      HOME DIABETES REGIMEN:  Tresiba 32 units daily  Metformin 500 mg 2  tablets BID  Jardiance 25 mg daily- not taking  Trulicity - not taking     METER DOWNLOAD SUMMARY: Did not bring  194-411 mg/dL     DIABETIC COMPLICATIONS: Microvascular complications:   Neuropathy  Denies: CKD,  retinopathy   Last eye exam: Completed  2020  Macrovascular complications:    Denies: CAD, PVD, CVA    HISTORY:  Past Medical History:  Past Medical History:  Diagnosis Date  . Acute renal failure (ARF) (HCC)   . Allergy   . Depression   . Diabetes mellitus   . Morbid obesity (HCC)    Past Surgical History:  Past Surgical History:  Procedure Laterality Date  . INCISION AND DRAINAGE ABSCESS N/A 10/21/2015   Procedure: dressing change to  PRESACRAL ABCESS;  Surgeon: Romie Levee, MD;  Location: [facility];  Service: General;  Laterality: N/A;  dressing to sacral area  . IR GENERIC HISTORICAL  10/27/2015   IR FLUORO GUIDE CV LINE RIGHT 10/27/2015 Gilmer Mor, DO [facility]  . IR GENERIC HISTORICAL  10/27/2015   IR US GUIDE VASC ACCESS RIGHT 10/27/2015 Gilmer Mor, DO [facility]  . IRRIGATION AND DEBRIDEMENT ABSCESS N/A 10/19/2015   Procedure: IRRIGATION AND DEBRIDEMENT PRESACRAL ABSCESS;  Surgeon: Claud Kelp, MD;  Location: [facility];  Service: General;  Laterality: N/A;  . TUBAL LIGATION      Social History:  reports that she has never smoked. She has never used smokeless tobacco. She reports that she does not drink alcohol or use drugs. Family History:  Family History  Problem Relation Age of Onset  . Diabetes Mother   . Lymphoma Father   . Lymphoma Brother      HOME MEDICATIONS: Allergies  as of 05/27/2019      Reactions   Codeine Shortness Of Breath   Contrast Media [iodinated Diagnostic Agents] Other (See Comments)   Kidney shutdown      Medication List       Accurate as of May 27, 2019  8:29 AM. If you have any questions, ask your nurse or doctor.        acetaminophen 650 MG CR tablet Commonly known as: TYLENOL Take 650-1,300 mg by mouth every 8 (eight) hours as needed for pain.   cephALEXin 500 MG capsule Commonly known as: KEFLEX Take 1 capsule (500 mg total) by mouth 3 (three) times daily.   fluconazole 150 MG tablet Commonly known as:  Diflucan Take 1 tablet (150 mg total) by mouth daily. If needed for yeast infection   gabapentin 100 MG capsule Commonly known as: NEURONTIN Take 200 mg by mouth 2 (two) times a day.   Jardiance 25 MG Tabs tablet Generic drug: empagliflozin Take 25 mg by mouth daily before breakfast.   metFORMIN 500 MG tablet Commonly known as: GLUCOPHAGE Take 1,000 mg by mouth 2 (two) times daily with a meal.   sertraline 25 MG tablet Commonly known as: ZOLOFT Take 25 mg by mouth daily.   Tyler Aas FlexTouch 100 UNIT/ML FlexTouch Pen Generic drug: insulin degludec Inject 0.32 mLs (32 Units total) into the skin daily.   Trulicity 1.5 QV/9.5GL Sopn Generic drug: Dulaglutide Inject 1.5 mg into the skin once a week.   Ventolin HFA 108 (90 Base) MCG/ACT inhaler Generic drug: albuterol Inhale 2 puffs into the lungs every 6 (six) hours as needed for wheezing or shortness of breath.        OBJECTIVE:   Vital Signs: BP 118/68 (BP Location: Left Arm, Patient Position: Sitting, Cuff Size: Large)   Pulse 71   Temp 98.2 F (36.8 C)   Ht 5\' 7"  (1.702 m)   Wt (!) 334 lb 6.4 oz (151.7 kg)   SpO2 97%   BMI 52.37 kg/m   Wt Readings from Last 3 Encounters:  05/27/19 (!) 334 lb 6.4 oz (151.7 kg)  04/29/19 (!) 320 lb (145.2 kg)  09/16/18 (!) 337 lb 6.4 oz (153 kg)     Exam: General: Pt appears well and is in NAD  Lungs: Clear with good BS bilat with no rales, rhonchi, or wheezes  Heart: RRR with normal S1 and S2 and no gallops; no murmurs; no rub  Extremities: No pretibial edema.   Neuro: MS is good with appropriate affect, pt is alert and Ox3       DM foot exam: 05/27/2019  The skin of the feet is intact without sores or ulcerations. The pedal pulses are 2+ on right and 2+ on left. The sensation is absent at the great toes to a screening 5.07, 10 gram monofilament.       DATA REVIEWED:  Lab Results  Component Value Date   HGBA1C 10.8 (A) 07/22/2018   HGBA1C 11.7 (H) 10/19/2015    HGBA1C 11.1 (H) 05/05/2013   Lab Results  Component Value Date   LDLCALC 73 07/14/2012   CREATININE 0.81 04/29/2019   Lab Results  Component Value Date   MICRALBCREAT <4.1 09/08/2012     Lab Results  Component Value Date   CHOL 179 07/14/2012   HDL 95 07/14/2012   LDLCALC 73 07/14/2012   TRIG 57 07/14/2012   CHOLHDL 1.9 07/14/2012         ASSESSMENT / PLAN / RECOMMENDATIONS:  1) Type 2 Diabetes Mellitus, Poorly controlled, With neuropathic complications - Most recent A1c of > 15.0 %. ( Per pt) Goal A1c < 7.0 %.  -Over the past year the patient had continuous difficulty obtaining GLP-1 agonist and SGLT2 inhibitors due to insurance requirements. -We have opted to hold these for now.  I have recommended starting a prandial insulin. -I am going to start her on a regular insulin versus the insulin analogs, may be that will give her better coverage and pricing through her insurance, patient advised to take this insulin 20 to 30 minutes before each meal.  -In the meantime she has been asked to call her insurance company and request the formulary, she was advised to bring the formulary on the next visit.  MEDICATIONS:  -Increase Tresiba to 38 units daily - Continue Metformin 500 mg 4 tabs daily -Start Novolin-R at 12 units before each meal   EDUCATION / INSTRUCTIONS:  BG monitoring instructions: Patient is instructed to check her blood sugars 2 times a day, before breakfast and Supper.  Call [facility] if: BG persistently < 70 or > 300. . I reviewed the Rule of 15 for the treatment of hypoglycemia in detail with the patient. Literature supplied.     F/U in 3 months    Signed electronically by: Lyndle Herrlich, MD  [facility] Endocrinology  [facility] [address] [place], [place] [address] Phone: [phone] FAX: [phone]   CC: Verlon Au, MD [address] [place]  [address] Phone: [phone]  Fax: [phone]  Return to Endocrinology clinic as below: No future appointments.

## 2019-05-28 NOTE — Telephone Encounter (Signed)
Requested information was faxed to [employer] on 05/27/2019 with confirmation received.

## 2019-05-31 ENCOUNTER — Other Ambulatory Visit: Payer: Self-pay | Admitting: Internal Medicine

## 2019-05-31 MED ORDER — INSULIN LISPRO (1 UNIT DIAL) 100 UNIT/ML (KWIKPEN): 12.0000 [IU] | PEN_INJECTOR | Freq: Three times a day (TID) | SUBCUTANEOUS | 11 refills | Status: DC

## 2019-05-31 NOTE — Telephone Encounter (Signed)
Spoke to [employer] and asked her to fax information to our office.

## 2019-05-31 NOTE — Telephone Encounter (Signed)
[name] is calling re [name]  DOB [id number] - needs verification regarding the PA they recieved back from [place]  [place] with [place] [phone]

## 2019-06-02 NOTE — Telephone Encounter (Signed)
Torrie informed.

## 2019-06-02 NOTE — Telephone Encounter (Signed)
Spoke to [name] and she wanted to know if you wanted pt back on Trulicity. I had already faxed them her last OV notes and informed her that the medications that you wanted to continue/start were included and trulicity was not on her list. Please advise.

## 2019-08-09 ENCOUNTER — Other Ambulatory Visit: Payer: Self-pay | Admitting: Internal Medicine

## 2019-08-26 ENCOUNTER — Ambulatory Visit: Payer: PRIVATE HEALTH INSURANCE | Admitting: Internal Medicine

## 2019-09-01 ENCOUNTER — Emergency Department: Payer: No Typology Code available for payment source

## 2019-09-01 ENCOUNTER — Other Ambulatory Visit: Payer: Self-pay

## 2019-09-01 DIAGNOSIS — X501XXA Overexertion from prolonged static or awkward postures, initial encounter: Secondary | ICD-10-CM | POA: Diagnosis not present

## 2019-09-01 DIAGNOSIS — Z79899 Other long term (current) drug therapy: Secondary | ICD-10-CM | POA: Insufficient documentation

## 2019-09-01 DIAGNOSIS — Y929 Unspecified place or not applicable: Secondary | ICD-10-CM | POA: Insufficient documentation

## 2019-09-01 DIAGNOSIS — R2242 Localized swelling, mass and lump, left lower limb: Secondary | ICD-10-CM | POA: Diagnosis not present

## 2019-09-01 DIAGNOSIS — E119 Type 2 diabetes mellitus without complications: Secondary | ICD-10-CM | POA: Insufficient documentation

## 2019-09-01 DIAGNOSIS — Y999 Unspecified external cause status: Secondary | ICD-10-CM | POA: Diagnosis not present

## 2019-09-01 DIAGNOSIS — Y9389 Activity, other specified: Secondary | ICD-10-CM | POA: Diagnosis not present

## 2019-09-01 DIAGNOSIS — S86002A Unspecified injury of left Achilles tendon, initial encounter: Secondary | ICD-10-CM | POA: Diagnosis not present

## 2019-09-01 DIAGNOSIS — Z794 Long term (current) use of insulin: Secondary | ICD-10-CM | POA: Insufficient documentation

## 2019-09-01 LAB — BASIC METABOLIC PANEL
Anion gap: 9 (ref 5–15)
BUN: 24 mg/dL — ABNORMAL HIGH (ref 6–20)
CO2: 26 mmol/L (ref 22–32)
Calcium: 9.2 mg/dL (ref 8.9–10.3)
Chloride: 98 mmol/L (ref 98–111)
Creatinine, Ser: 0.77 mg/dL (ref 0.44–1.00)
GFR calc Af Amer: >60 mL/min (ref >60)
GFR calc non Af Amer: >60 mL/min (ref >60)
Glucose, Bld: 271 mg/dL — ABNORMAL HIGH (ref 70–99)
Potassium: 4.1 mmol/L (ref 3.5–5.1)
Sodium: 133 mmol/L — ABNORMAL LOW (ref 135–145)

## 2019-09-01 LAB — CBC
HCT: 32 % — ABNORMAL LOW (ref 36.0–46.0)
Hemoglobin: 10.5 g/dL — ABNORMAL LOW (ref 12.0–15.0)
MCH: 27.7 pg (ref 26.0–34.0)
MCHC: 32.8 g/dL (ref 30.0–36.0)
MCV: 84.4 fL (ref 80.0–100.0)
Platelets: 178 10*3/uL (ref 150–400)
RBC: 3.79 MIL/uL — ABNORMAL LOW (ref 3.87–5.11)
RDW: 13.2 % (ref 11.5–15.5)
WBC: 4.7 10*3/uL (ref 4.0–10.5)
nRBC: 0 % (ref 0.0–0.2)

## 2019-09-01 NOTE — ED Triage Notes (Signed)
Pt ambulatory to triage.  Pt has swelling and pain to left lower leg with red rash x 2 days.  Pt tripped and fell last night in the house.  Pt alert.

## 2019-09-02 ENCOUNTER — Emergency Department
Admission: EM | Admit: 2019-09-02 | Discharge: 2019-09-02 | Disposition: A | Discharge status: Home / Self Care | Payer: No Typology Code available for payment source | Attending: Emergency Medicine | Admitting: Emergency Medicine

## 2019-09-02 ENCOUNTER — Emergency Department: Payer: No Typology Code available for payment source

## 2019-09-02 DIAGNOSIS — S86002A Unspecified injury of left Achilles tendon, initial encounter: Secondary | ICD-10-CM | POA: Diagnosis not present

## 2019-09-02 MED ORDER — CEPHALEXIN 500 MG PO CAPS: 500.0000 mg | ORAL_CAPSULE | Freq: Four times a day (QID) | ORAL | 0 refills | Status: DC

## 2019-09-02 MED ORDER — TRAMADOL HCL 50 MG PO TABS: 100.0000 mg | ORAL_TABLET | Freq: Four times a day (QID) | ORAL | 0 refills | Status: DC | PRN

## 2019-09-02 NOTE — Discharge Instructions (Addendum)
As we discussed, I think it is likely that you injured your Achilles tendon which is led to the pain, difficulty with walking, and swelling in your lower leg.  We have placed you in a splint and encourage you to not bear weight on that leg and use the crutches, rest, ice, and elevation as much as possible.  Call orthopedics for a follow-up appointment as listed above.  As we discussed, I think it is very unlikely that you have an infection in your lower leg.  However, because you have diabetes and because we are putting a splint on you, I am treating you with antibiotics just to make sure that if you do have a mild infection it does not get worse while the splint is in place.  Please complete the full course of antibiotics as prescribed.   Take over-the-counter ibuprofen and/or Tylenol according to label instructions for pain.  Take Tramadol as prescribed for severe pain. Do not drink alcohol, drive or participate in any other potentially dangerous activities while taking this medication as it may make you sleepy. Do not take this medication with any other sedating medications, either prescription or over-the-counter. If you were prescribed Percocet or Vicodin, do not take these with acetaminophen (Tylenol) as it is already contained within these medications.   This medication is an opiate (or narcotic) pain medication and can be habit forming.  Use it as little as possible to achieve adequate pain control.  Do not use or use it with extreme caution if you have a history of opiate abuse or dependence.  If you are on a pain contract with your primary care doctor or a pain specialist, be sure to let them know you were prescribed this medication today from the [facility] Emergency Department.  This medication is intended for your use only - do not give any to anyone else and keep it in a secure place where nobody else, especially children, have access to it.  It will also cause or worsen constipation, so  you may want to consider taking an over-the-counter stool softener while you are taking this medication.    Return to the emergency department if you develop new or worsening symptoms that concern you.

## 2019-09-02 NOTE — ED Provider Notes (Addendum)
[facility] Emergency Department Provider Note  ____________________________________________   First MD Initiated Contact with Patient 09/02/19 [phone]     (approximate)  I have reviewed the triage vital signs and the nursing notes.   HISTORY  Chief Complaint Cellulitis    HPI [name] is a 49 y.o. female with medical history as listed below who presents for evaluation  of acute onset pain in her left lower leg about 6 days ago which is gradually gotten worse and now is accompanied with some redness and swelling.  She said that she was standing at her door and somehow turned or stepped wrong and felt a pop in the lower part of her left leg.  She had acute onset of pain which got better with time and she was initially able to walk but that has become more difficult of the last few days.  She has had a couple of falls as a result.  She noticed that she has some redness at the bottom of her leg as well as some swelling to her foot and ankle so she came in for further evaluation.  The pain has become severe and pushing on it or bearing weight makes the pain worse, resting it makes it better.  She has had no fever, sore throat, chest pain, shortness of breath, nausea, vomiting, nor abdominal pain.  She has no other injuries.  She has no numbness nor tingling.  She did not sustain a laceration or break in her skin and there was no traumatic injury, just the "pop" that occurred while bearing weight.  She has no history of blood clots in her legs or her lungs.        Past Medical History:  Diagnosis Date  . Acute renal failure (ARF) (HCC)   . Allergy   . Depression   . Diabetes mellitus   . Morbid obesity [facility])     Patient Active Problem List   Diagnosis Date Noted  . Type 2 diabetes mellitus with hyperglycemia, with long-term current use of insulin (HCC) 05/27/2019  . ATN (acute tubular necrosis) (HCC) 10/31/2015  . Cellulitis 10/19/2015  . Necrotizing  soft tissue infection 10/19/2015  . Acute sinus infection 07/07/2013  . Gall stones 07/07/2013  . Osteoarthritis 03/09/2013  . Transaminitis 01/26/2013  . Depression 01/26/2013  . DM type 2 goal A1C below 7.5 01/26/2013  . Diabetes mellitus (HCC) 07/15/2012  . Disorder of bone and cartilage 07/15/2012  . Morbid obesity (HCC) 07/15/2012  . Other malaise and fatigue 07/15/2012    Past Surgical History:  Procedure Laterality Date  . INCISION AND DRAINAGE ABSCESS N/A 10/21/2015   Procedure: dressing change to  PRESACRAL ABCESS;  Surgeon: Romie Levee, MD;  Location: [facility] ORS;  Service: General;  Laterality: N/A;  dressing to sacral area  . IR GENERIC HISTORICAL  10/27/2015   IR FLUORO GUIDE CV LINE RIGHT 10/27/2015 Gilmer Mor, DO [facility]INTER[facility]  . IR GENERIC HISTORICAL  10/27/2015   IR US GUIDE VASC ACCESS RIGHT 10/27/2015 Gilmer Mor, DO [facility]  . IRRIGATION AND DEBRIDEMENT ABSCESS N/A 10/19/2015   Procedure: IRRIGATION AND DEBRIDEMENT PRESACRAL ABSCESS;  Surgeon: Claud Kelp, MD;  Location: [facility] ORS;  Service: General;  Laterality: N/A;  . TUBAL LIGATION      Prior to Admission medications   Medication Sig Start Date End Date Taking? Authorizing Provider  acetaminophen (TYLENOL) 650 MG CR tablet Take 650-1,300 mg by mouth every 8 (eight) hours as needed for pain.  [provider]  albuterol (VENTOLIN HFA) 108 (90 Base) MCG/ACT inhaler Inhale 2 puffs into the lungs every 6 (six) hours as needed for wheezing or shortness of breath.  06/15/15 06/14/16  [provider]  cephALEXin (KEFLEX) 500 MG capsule Take 1 capsule (500 mg total) by mouth 4 (four) times daily. 09/02/19   Loleta Rose, MD  gabapentin (NEURONTIN) 100 MG capsule Take 200 mg by mouth 2 (two) times a day.    [provider]  insulin lispro (HUMALOG) 100 UNIT/ML KwikPen Inject 0.12 mLs (12 Units total) into the skin 3 (three) times daily. 05/31/19   Shamleffer, Konrad Dolores, MD  Insulin Pen  Needle 31G X 5 MM MISC 1 Device by Does not apply route as directed. 05/27/19   Shamleffer, Konrad Dolores, MD  metFORMIN (GLUCOPHAGE) 500 MG tablet Take 2 tablets (1,000 mg total) by mouth 2 (two) times daily with a meal. 05/27/19   Shamleffer, Konrad Dolores, MD  sertraline (ZOLOFT) 25 MG tablet Take 25 mg by mouth daily.    [provider]  traMADol (ULTRAM) 50 MG tablet Take 2 tablets (100 mg total) by mouth every 6 (six) hours as needed for moderate pain or severe pain. 09/02/19   Loleta Rose, MD  TRESIBA FLEXTOUCH 100 UNIT/ML FlexTouch Pen INJECT 0.32 MLS (32 UNITS TOTAL) INTO THE SKIN DAILY. TO REPLACE BASAGLAR IF CHEAPER WITH THE COUPON 08/10/19   Shamleffer, Konrad Dolores, MD    Allergies Codeine and Contrast media [iodinated diagnostic agents]  Family History  Problem Relation Age of Onset  . Diabetes Mother   . Lymphoma Father   . Lymphoma Brother     Social History Social History   Tobacco Use  . Smoking status: Never Smoker  . Smokeless tobacco: Never Used  Substance Use Topics  . Alcohol use: No  . Drug use: No    Review of Systems Constitutional: No fever/chills Eyes: No visual changes. ENT: No sore throat. Cardiovascular: Denies chest pain. Respiratory: Denies shortness of breath. Gastrointestinal: No abdominal pain.  No nausea, no vomiting.  No diarrhea.  No constipation. Genitourinary: Negative for dysuria. Musculoskeletal: Left lower leg pain, swelling, and redness. Integumentary: Redness in the posterior left lower leg. Neurological: Negative for headaches, focal weakness or numbness.   ____________________________________________   PHYSICAL EXAM:  VITAL SIGNS: ED Triage Vitals  Enc Vitals Group     BP 09/01/19 2151 (!) 126/58     Pulse Rate 09/01/19 2151 77     Resp 09/01/19 2151 20     Temp 09/01/19 2151 98.6 F (37 C)     Temp Source 09/01/19 2151 Oral     SpO2 09/01/19 2151 96 %     Weight 09/01/19 2152 (!) 145.2 kg (320 lb)      Height 09/01/19 2152 1.676 m (5\' 6" )     Head Circumference --      Peak Flow --      Pain Score 09/01/19 2152 9     Pain Loc --      Pain Edu? --      Excl. in GC? --     Constitutional: Alert and oriented.  Eyes: Conjunctivae are normal.  Head: Atraumatic. Nose: No congestion/rhinnorhea. Mouth/Throat: Patient is wearing a mask. Neck: No stridor.  No meningeal signs.   Cardiovascular: Normal rate, regular rhythm. Good peripheral circulation. Grossly normal heart sounds. Respiratory: Normal respiratory effort.  No retractions. Gastrointestinal: Morbid obesity.  Soft and nontender. No distention.  Musculoskeletal: The patient has some  isolated erythema and possibly some ecchymosis of the posterior left lower leg just above the ankle.  There is no obvious discoloration of the ankle itself but she has some swelling on both the posterior and lateral malleolus.  She is able to plantarflex and dorsiflex against my hand to at least some degree although the plantar flexion seems to cause some reproducible pain.  The erythema does not appear consistent with vasculitis or cellulitis.  It is isolated to a relatively small area around the area of the Achilles tendon. Neurologic:  Normal speech and language. No gross focal neurologic deficits are appreciated.  Skin:  Skin is warm, dry and intact. Psychiatric: Mood and affect are normal. Speech and behavior are normal.  ____________________________________________   LABS (all labs ordered are listed, but only abnormal results are displayed)  Labs Reviewed  BASIC METABOLIC PANEL - Abnormal; Notable for the following components:      Result Value   Sodium 133 (*)    Glucose, Bld 271 (*)    BUN 24 (*)    All other components within normal limits  CBC - Abnormal; Notable for the following components:   RBC 3.79 (*)    Hemoglobin 10.5 (*)    HCT 32.0 (*)    All other components within normal limits    ____________________________________________  EKG  No indication for emergent EKG ____________________________________________  RADIOLOGY I, Loleta Rose, personally viewed and evaluated these images (plain radiographs) as part of my medical decision making, as well as reviewing the written report by the radiologist.  ED MD interpretation: No evidence of DVT on lower extremity ultrasound.  Official radiology report(s): DG Ankle 2 Views Left  Result Date: 09/02/2019 CLINICAL DATA:  Pain and swelling. EXAM: LEFT ANKLE - 2 VIEW COMPARISON:  Left foot series report 10/15/1997. FINDINGS: Patient is in a splint. Diffuse soft tissue swelling. Diffuse degenerative change left ankle and foot. Prominent calcaneal spurring. No acute bony or joint abnormality identified. Venous calcification. No radiopaque foreign body. IMPRESSION: 1.  Patient is in a splint.  Diffuse soft tissue swelling. 2. Diffuse degenerative change left ankle and foot. Calcaneal spurring. No acute bony or joint abnormality identified. Electronically Signed   By: Maisie Fus  Register   On: 09/02/2019 05:50   US Venous Img Lower Unilateral Left  Result Date: 09/01/2019 CLINICAL DATA:  Left leg pain and swelling for 2 days EXAM: LEFT LOWER EXTREMITY VENOUS DOPPLER ULTRASOUND TECHNIQUE: Gray-scale sonography with graded compression, as well as color Doppler and duplex ultrasound were performed to evaluate the lower extremity deep venous systems from the level of the common femoral vein and including the common femoral, femoral, profunda femoral, popliteal and calf veins including the posterior tibial, peroneal and gastrocnemius veins when visible. The superficial great saphenous vein was also interrogated. Spectral Doppler was utilized to evaluate flow at rest and with distal augmentation maneuvers in the common femoral, femoral and popliteal veins. COMPARISON:  None. FINDINGS: Contralateral Common Femoral Vein: Respiratory phasicity is  normal and symmetric with the symptomatic side. No evidence of thrombus. Normal compressibility. Common Femoral Vein: No evidence of thrombus. Normal compressibility, respiratory phasicity and response to augmentation. Saphenofemoral Junction: No evidence of thrombus. Normal compressibility and flow on color Doppler imaging. Profunda Femoral Vein: No evidence of thrombus. Normal compressibility and flow on color Doppler imaging. Femoral Vein: No evidence of thrombus. Normal compressibility, respiratory phasicity and response to augmentation. Popliteal Vein: No evidence of thrombus. Normal compressibility, respiratory phasicity and response to augmentation. Calf Veins: No  evidence of thrombus. Normal compressibility and flow on color Doppler imaging. Superficial Great Saphenous Vein: No evidence of thrombus. Normal compressibility. Venous Reflux:  None. Other Findings:  None. IMPRESSION: No evidence of deep venous thrombosis. Electronically Signed   By: Alcide CleverMark  Lukens M.D.   On: 09/01/2019 22:45    ____________________________________________   PROCEDURES   Procedure(s) performed (including Critical Care):  .Ortho Injury Treatment  Date/Time: 09/02/2019 5:11 AM Performed by: Loleta RoseForbach, Fidelis Loth, MD Authorized by: Loleta RoseForbach, Kamsiyochukwu Buist, MD   Consent:    Consent obtained:  Verbal   Consent given by:  Patient   Risks discussed:  Restricted joint movement and stiffness   Alternatives discussed:  No treatmentInjury location: lower leg Location details: left lower leg Injury type: soft tissue Pre-procedure neurovascular assessment: neurovascularly intact Pre-procedure distal perfusion: normal Pre-procedure neurological function: normal Pre-procedure range of motion: normal  Anesthesia: Local anesthesia used: no  Patient sedated: NoImmobilization: splint Splint type: short leg Supplies used: Ortho-Glass Post-procedure neurovascular assessment: post-procedure neurovascularly intact Post-procedure distal  perfusion: normal Post-procedure neurological function: normal Post-procedure range of motion: unchanged Patient tolerance: patient tolerated the procedure well with no immediate complications      ____________________________________________   INITIAL IMPRESSION / MDM / ASSESSMENT AND PLAN / ED COURSE  As part of my medical decision making, I reviewed the following data within the electronic MEDICAL RECORD NUMBER Nursing notes reviewed and incorporated, Labs reviewed , Old chart reviewed, Notes from prior ED visits and  Controlled Substance Database   Differential diagnosis includes, but is not limited to, Achilles tendon injury, other nonspecific musculoskeletal injury, DVT, fracture, dislocation, cellulitis.  Based on the patient's mechanism and description of injury, I think she sustained at least a partial tear to her left Achilles tendon which is gradually worsened as she continues to bear weight on it over the last 6 days.  I believe that the erythema is most likely some internal bleeding from the tendon rupture which is also collecting causing some swelling in the ankle.  She was initially ambulatory but it has become painful to bear weight over time.  I think that the probability of a malleolar fracture is very low.  Given her current presentation I think she would benefit from a splint, nonweightbearing, and outpatient follow-up with orthopedics.  I explained all of this to the patient and she understands and agrees with the plan.  She may benefit from additional outpatient imaging with orthopedics including either radiographs or MRI at their discretion when she follows up.  Given that she is an insulin-dependent diabetic and that I cannot completely rule out cellulitis although her lab results are reassuring and she has no leukocytosis and no fever, I am going to give her a course of Keflex since she will have the affected area within a splint and will not be able to see if it  progresses.  She also understands the importance of taking her antibiotics.  I gave my usual and customary return precautions.       Clinical Course as of Sep 02 555  Thu Sep 02, 2019  [id number] Prior to discharge I obtained radiographs of the ankle just to make sure there is no gross abnormality of the bone.  The radiographs were reassuring and I reviewed them myself as well as reviewing the radiologist report.  DG Ankle 2 Views Left [CF]    Clinical Course User Index [CF] Loleta RoseForbach, Stana Bayon, MD     ____________________________________________  FINAL CLINICAL IMPRESSION(S) / ED DIAGNOSES  Final diagnoses:  Achilles tendon injury, left, initial encounter     MEDICATIONS GIVEN DURING THIS VISIT:  Medications - No data to display   ED Discharge Orders         Ordered    cephALEXin (KEFLEX) 500 MG capsule  4 times daily     Discontinue  Reprint     09/02/19 0459    traMADol (ULTRAM) 50 MG tablet  Every 6 hours PRN     Discontinue  Reprint     09/02/19 0459          *Please note:  Amyria C Cavell was evaluated in Emergency Department on 09/02/2019 for the symptoms described in the history of present illness. She was evaluated in the context of the global COVID-19 pandemic, which necessitated consideration that the patient might be at risk for infection with the SARS-CoV-2 virus that causes COVID-19. Institutional protocols and algorithms that pertain to the evaluation of patients at risk for COVID-19 are in a state of rapid change based on information released by regulatory bodies including the CDC and federal and state organizations. These policies and algorithms were followed during the patient's care in the ED.  Some ED evaluations and interventions may be delayed as a result of limited staffing during and after the pandemic.*  Note:  This document was prepared using Dragon voice recognition software and may include unintentional dictation errors.   Loleta Rose, MD 09/02/19  1610    Loleta Rose, MD 09/02/19 [phone]

## 2019-10-01 ENCOUNTER — Other Ambulatory Visit: Payer: Self-pay

## 2019-10-01 MED ORDER — TRESIBA FLEXTOUCH 100 UNIT/ML ~~LOC~~ SOPN: 38.0000 [IU] | PEN_INJECTOR | Freq: Every day | SUBCUTANEOUS | 1 refills | Status: DC

## 2019-10-06 NOTE — Telephone Encounter (Signed)
[employer] (Candace) called stating she cannot move forward with the PA for trulicity for this patient without a recent A1C. Needs to be at least within 6 months.

## 2019-10-07 NOTE — Telephone Encounter (Signed)
Would you like to move up appt to have a1c done so PA can proceed?

## 2019-10-08 NOTE — Telephone Encounter (Signed)
Contacted pt and made her aware of our efforts to get Trulicity approved. Pt stated that she no longer has this insurance, and she is currently without insurance. Her new insurance will kick in in 90 days, but she did also state that she has plenty of medication to get her through.

## 2019-10-13 NOTE — Telephone Encounter (Signed)
Sona called to advise that is the preferred insulin is Semglee if the doctor felt that it would be if that was an acceptable substitute for Trulicity.  Copay changes from $15 per month to $0 per month.  They will still need A1C done within 6 months. Advised them of projected date for A1C and they are going to keep PA for Trulicity open until after patient next appointment.

## 2019-10-31 NOTE — Progress Notes (Deleted)
Name: [name]  Age/ Sex: 50 y.o., female   MRN/ DOB: [id number], [birth year]     PCP: Verlon Au, MD   Reason for Endocrinology Evaluation: Type 2 Diabetes Mellitus  Initial Endocrine Consultative Visit: 07/22/2018    PATIENT IDENTIFIER: [name] is a 49 y.o. female with a past medical history of T2DM, Hx of acute renal failure S/P temporary dialysis in 2017 . The patient has followed with Endocrinology clinic since 07/22/2018 for consultative assistance with management of her diabetes.  DIABETIC HISTORY:  [name] was diagnosed with T2DM in ~ 2000. She was on Ozempic and Novolog but cost was prohibitive, Has been on insulin for years. Her hemoglobin A1c has ranged from 9.8 % in 2014, peaking at 12.9%  in 2011.  Works 3 pm to 11 PM . Wakes up 1 pm    On her initial visit to our clinic her A1c was 10.8%. She was on basal insulin, Metformin and Jardiance  SUBJECTIVE:   During the last visit (05/27/2019): A1c > 15.0% , we increased basal insulin, started Novolin-R and continued metformin   Today (10/31/2019): [name] is here for a follow up on diabetes management.  She has not been to our clinic in 5 months. She has not been to our office since 09/2018  She checks her blood sugars 2 times daily, preprandial. The patient has not had hypoglycemic episodes since the last clinic visit.Marland Kitchen    Pt presented to the ED 04/29/2019 with UTI.       HOME DIABETES REGIMEN:  Tresiba 38 units daily  Metformin 500 mg 4  tablets daily  Novolin-R 12 units TIDQAC    METER DOWNLOAD SUMMARY: Did not bring  194-411 mg/dL     DIABETIC COMPLICATIONS: Microvascular complications:   Neuropathy  Denies: CKD, retinopathy   Last eye exam: Completed  2020  Macrovascular complications:    Denies: CAD, PVD, CVA    HISTORY:  Past Medical History:  Past Medical History:  Diagnosis Date  . Acute renal failure (ARF) (HCC)   . Allergy   .  Depression   . Diabetes mellitus   . Morbid obesity (HCC)    Past Surgical History:  Past Surgical History:  Procedure Laterality Date  . INCISION AND DRAINAGE ABSCESS N/A 10/21/2015   Procedure: dressing change to  PRESACRAL ABCESS;  Surgeon: Romie Levee, MD;  Location: [facility];  Service: General;  Laterality: N/A;  dressing to sacral area  . IR GENERIC HISTORICAL  10/27/2015   IR FLUORO GUIDE CV LINE RIGHT 10/27/2015 Gilmer Mor, DO [facility]  . IR GENERIC HISTORICAL  10/27/2015   IR US GUIDE VASC ACCESS RIGHT 10/27/2015 Gilmer Mor, DO [facility]  . IRRIGATION AND DEBRIDEMENT ABSCESS N/A 10/19/2015   Procedure: IRRIGATION AND DEBRIDEMENT PRESACRAL ABSCESS;  Surgeon: Claud Kelp, MD;  Location: [facility];  Service: General;  Laterality: N/A;  . TUBAL LIGATION      Social History:  reports that she has never smoked. She has never used smokeless tobacco. She reports that she does not drink alcohol and does not use drugs. Family History:  Family History  Problem Relation Age of Onset  . Diabetes Mother   . Lymphoma Father   . Lymphoma Brother      HOME MEDICATIONS: Allergies as of 11/01/2019      Reactions   Codeine Shortness Of Breath   Contrast Media [iodinated Diagnostic Agents] Other (See Comments)   Kidney shutdown  Medication List       Accurate as of October 31, 2019  7:56 PM. If you have any questions, ask your nurse or doctor.        acetaminophen 650 MG CR tablet Commonly known as: TYLENOL Take 650-1,300 mg by mouth every 8 (eight) hours as needed for pain.   cephALEXin 500 MG capsule Commonly known as: KEFLEX Take 1 capsule (500 mg total) by mouth 4 (four) times daily.   gabapentin 100 MG capsule Commonly known as: NEURONTIN Take 200 mg by mouth 2 (two) times a day.   insulin lispro 100 UNIT/ML KwikPen Commonly known as: HUMALOG Inject 0.12 mLs (12 Units total) into the skin 3 (three) times daily.   Insulin Pen Needle 31G X 5 MM Misc 1  Device by Does not apply route as directed.   metFORMIN 500 MG tablet Commonly known as: GLUCOPHAGE Take 2 tablets (1,000 mg total) by mouth 2 (two) times daily with a meal.   sertraline 25 MG tablet Commonly known as: ZOLOFT Take 25 mg by mouth daily.   traMADol 50 MG tablet Commonly known as: Ultram Take 2 tablets (100 mg total) by mouth every 6 (six) hours as needed for moderate pain or severe pain.   Evaristo Bury FlexTouch 100 UNIT/ML FlexTouch Pen Generic drug: insulin degludec Inject 0.38 mLs (38 Units total) into the skin daily.   Ventolin HFA 108 (90 Base) MCG/ACT inhaler Generic drug: albuterol Inhale 2 puffs into the lungs every 6 (six) hours as needed for wheezing or shortness of breath.        OBJECTIVE:   Vital Signs: There were no vitals taken for this visit.  Wt Readings from Last 3 Encounters:  09/01/19 (!) 320 lb (145.2 kg)  05/27/19 (!) 334 lb 6.4 oz (151.7 kg)  04/29/19 (!) 320 lb (145.2 kg)     Exam: General: Pt appears well and is in NAD  Lungs: Clear with good BS bilat with no rales, rhonchi, or wheezes  Heart: RRR with normal S1 and S2 and no gallops; no murmurs; no rub  Extremities: No pretibial edema.   Neuro: MS is good with appropriate affect, pt is alert and Ox3       DM foot exam: 05/27/2019  The skin of the feet is intact without sores or ulcerations. The pedal pulses are 2+ on right and 2+ on left. The sensation is absent at the great toes to a screening 5.07, 10 gram monofilament.       DATA REVIEWED:  Lab Results  Component Value Date   HGBA1C 10.8 (A) 07/22/2018   HGBA1C 11.7 (H) 10/19/2015   HGBA1C 11.1 (H) 05/05/2013   Lab Results  Component Value Date   LDLCALC 73 07/14/2012   CREATININE 0.77 09/01/2019   Lab Results  Component Value Date   MICRALBCREAT <4.1 09/08/2012     Lab Results  Component Value Date   CHOL 179 07/14/2012   HDL 95 07/14/2012   LDLCALC 73 07/14/2012   TRIG 57 07/14/2012   CHOLHDL 1.9  07/14/2012         ASSESSMENT / PLAN / RECOMMENDATIONS:   1) Type 2 Diabetes Mellitus, Poorly controlled, With neuropathic complications - Most recent A1c of > 15.0 %. ( Per pt) Goal A1c < 7.0 %.  -Over the past year the patient had continuous difficulty obtaining GLP-1 agonist and SGLT2 inhibitors due to insurance requirements. -We have opted to hold these for now.  I have recommended starting a prandial insulin. -  I am going to start her on a regular insulin versus the insulin analogs, may be that will give her better coverage and pricing through her insurance, patient advised to take this insulin 20 to 30 minutes before each meal.  -In the meantime she has been asked to call her insurance company and request the formulary, she was advised to bring the formulary on the next visit.  MEDICATIONS:  -Increase Tresiba to 38 units daily - Continue Metformin 500 mg 4 tabs daily -Start Novolin-R at 12 units before each meal   EDUCATION / INSTRUCTIONS:  BG monitoring instructions: Patient is instructed to check her blood sugars 2 times a day, before breakfast and Supper.  Call [place] [facility] clinic if: BG persistently < 70 or > 300. . I reviewed the Rule of 15 for the treatment of hypoglycemia in detail with the patient. Literature supplied.     F/U in 3 months    Signed electronically by: Lyndle Herrlich, MD  [facility] Endocrinology  [facility] [address] [place] [address] [place], [place] [address] Phone: [phone] FAX: [phone]   CC: Verlon Au, MD [address] [place] [address] Phone: [phone]  Fax: [phone]  Return to Endocrinology clinic as below: Future Appointments  Date Time Provider Department Center  11/01/2019  9:40 AM Wynette Jersey, Konrad Dolores, MD [facility] None

## 2019-11-01 ENCOUNTER — Ambulatory Visit: Payer: PRIVATE HEALTH INSURANCE | Admitting: Internal Medicine

## 2020-04-06 ENCOUNTER — Encounter: Payer: Self-pay | Admitting: Internal Medicine

## 2020-04-06 ENCOUNTER — Other Ambulatory Visit: Payer: Self-pay

## 2020-04-06 ENCOUNTER — Ambulatory Visit (INDEPENDENT_AMBULATORY_CARE_PROVIDER_SITE_OTHER): Payer: Commercial Managed Care - PPO | Admitting: Internal Medicine

## 2020-04-06 VITALS — BP 136/82 | HR 88 | Ht 67.0 in | Wt 309.2 lb

## 2020-04-06 DIAGNOSIS — Z794 Long term (current) use of insulin: Secondary | ICD-10-CM | POA: Diagnosis not present

## 2020-04-06 DIAGNOSIS — E1165 Type 2 diabetes mellitus with hyperglycemia: Secondary | ICD-10-CM

## 2020-04-06 LAB — LIPID PANEL
Cholesterol: 173 mg/dL (ref 0–200)
HDL: 67.6 mg/dL (ref >39.00)
LDL Cholesterol: 87 mg/dL (ref 0–99)
NonHDL: 105.88
Total CHOL/HDL Ratio: 3
Triglycerides: 95 mg/dL (ref 0.0–149.0)
VLDL: 19 mg/dL (ref 0.0–40.0)

## 2020-04-06 LAB — POCT GLYCOSYLATED HEMOGLOBIN (HGB A1C): Hemoglobin A1C: 14 % — AB (ref 4.0–5.6)

## 2020-04-06 LAB — BASIC METABOLIC PANEL
BUN: 16 mg/dL (ref 6–23)
CO2: 27 mEq/L (ref 19–32)
Calcium: 9.8 mg/dL (ref 8.4–10.5)
Chloride: 100 mEq/L (ref 96–112)
Creatinine, Ser: 0.67 mg/dL (ref 0.40–1.20)
GFR: 101.25 mL/min (ref >60.00)
Glucose, Bld: 363 mg/dL — ABNORMAL HIGH (ref 70–99)
Potassium: 4.2 mEq/L (ref 3.5–5.1)
Sodium: 132 mEq/L — ABNORMAL LOW (ref 135–145)

## 2020-04-06 LAB — POCT GLUCOSE (DEVICE FOR HOME USE): POC Glucose: 367 mg/dl — AB (ref 70–99)

## 2020-04-06 LAB — MICROALBUMIN / CREATININE URINE RATIO
Creatinine,U: 59.3 mg/dL
Microalb Creat Ratio: 8.1 mg/g (ref 0.0–30.0)
Microalb, Ur: 4.8 mg/dL — ABNORMAL HIGH (ref 0.0–1.9)

## 2020-04-06 MED ORDER — DAPAGLIFLOZIN PROPANEDIOL 5 MG PO TABS: 5.0000 mg | ORAL_TABLET | Freq: Every day | ORAL | 6 refills | Status: DC

## 2020-04-06 MED ORDER — DEXCOM G6 TRANSMITTER MISC: 1.0000 | 3 refills | Status: AC

## 2020-04-06 MED ORDER — DEXCOM G6 SENSOR MISC: 1.0000 | 11 refills | Status: DC

## 2020-04-06 MED ORDER — TOUJEO MAX SOLOSTAR 300 UNIT/ML ~~LOC~~ SOPN: 45.0000 [IU] | PEN_INJECTOR | Freq: Every day | SUBCUTANEOUS | 3 refills | Status: DC

## 2020-04-06 NOTE — Progress Notes (Signed)
Name: [name]  Age/ Sex: 50 y.o., female   MRN/ DOB: [id number], 28-Mar-1970     PCP: Verlon Au, MD   Reason for Endocrinology Evaluation: Type 2 Diabetes Mellitus  Initial Endocrine Consultative Visit: 07/22/2018    PATIENT IDENTIFIER: [name] is a 50 y.o. female with a past medical history of T2DM, Hx of acute renal failure S/P temporary dialysis in 2017 . The patient has followed with Endocrinology clinic since 07/22/2018 for consultative assistance with management of her diabetes.  DIABETIC HISTORY:  [name] was diagnosed with T2DM in ~ 2000. She was on Ozempic and Novolog but cost was prohibitive, Has been on insulin for years. Her hemoglobin A1c has ranged from 9.8 % in 2014, peaking at 12.9%  in 2011.  Works 3 pm to 11 PM . Wakes up 1 pm    On her initial visit to our clinic her A1c was 10.8%. She was on basal insulin, Metformin and Jardiance  GLP-1 agonists and SGLT-2 inhibitors have been cost prohibitive   SUBJECTIVE:   During the last visit (05/27/2019): A1c >15.0 % , we reduced basal insulin,started Ozempic, continued Metformin and Jardiance.   Today (04/06/2020): [name] is here for a follow up on diabetes management.  She has not been to our office in 11 months.   She checks her blood sugars 3-4 times daily, preprandial. The patient has not had hypoglycemic episodes since the last clinic visit.. She did not bring her meter today     She had lost insurance and just started a new job  She is taking her Metformin regularly  Rarely forgets to take insulin   She ate burger and french fries last night but did not take regular insulin with it  Avoids sugar- sweetened beverages   HOME DIABETES REGIMEN:  Tresiba 38 units daily  Novolin-R at 12 units before each meal Metformin 500 mg 2  tablets BID     METER DOWNLOAD SUMMARY: Did not bring     DIABETIC COMPLICATIONS: Microvascular complications:    Neuropathy  Denies: CKD, retinopathy   Last eye exam: Completed  2020- hs an appointment 04/11/2020  Macrovascular complications:    Denies: CAD, PVD, CVA    HISTORY:  Past Medical History:  Past Medical History:  Diagnosis Date  . Acute renal failure (ARF) (HCC)   . Allergy   . Depression   . Diabetes mellitus   . Morbid obesity (HCC)    Past Surgical History:  Past Surgical History:  Procedure Laterality Date  . INCISION AND DRAINAGE ABSCESS N/A 10/21/2015   Procedure: dressing change to  PRESACRAL ABCESS;  Surgeon: Romie Levee, MD;  Location: [facility];  Service: General;  Laterality: N/A;  dressing to sacral area  . IR GENERIC HISTORICAL  10/27/2015   IR FLUORO GUIDE CV LINE RIGHT 10/27/2015 Gilmer Mor, DO [facility]  . IR GENERIC HISTORICAL  10/27/2015   IR US GUIDE VASC ACCESS RIGHT 10/27/2015 Gilmer Mor, DO [facility]  . IRRIGATION AND DEBRIDEMENT ABSCESS N/A 10/19/2015   Procedure: IRRIGATION AND DEBRIDEMENT PRESACRAL ABSCESS;  Surgeon: Claud Kelp, MD;  Location: [facility];  Service: General;  Laterality: N/A;  . TUBAL LIGATION      Social History:  reports that she has never smoked. She has never used smokeless tobacco. She reports that she does not drink alcohol and does not use drugs. Family History:  Family History  Problem Relation Age of Onset  . Diabetes Mother   .  Lymphoma Father   . Lymphoma Brother      HOME MEDICATIONS: Allergies as of 04/06/2020      Reactions   Codeine Shortness Of Breath   Atorvastatin Other (See Comments)   DIFFUSE MYALGIA, RESOLVED AFTER STOPPING RX; SHE TRIED IT AGAIN BUT DEVELOPED DIFFUSE MYALGIA AND REFUSES A DIFFERENT STATIN TRIAL DIFFUSE MYALGIA, RESOLVED AFTER STOPPING RX; SHE TRIED IT AGAIN BUT DEVELOPED DIFFUSE MYALGIA AND REFUSES A DIFFERENT STATIN TRIAL   Iodinated Diagnostic Agents Other (See Comments)   Kidney shutdown Other reaction(s): Other (See Comments) ACUTE TUBULAR NECROSIS AFTER IV CONTRAST  ADMINISTRATION FOR CT ACUTE TUBULAR NECROSIS AFTER IV CONTRAST ADMINISTRATION FOR CT   Metrizamide Other (See Comments)   Kidney shutdown Kidney shutdown      Medication List       Accurate as of April 06, 2020  1:04 PM. If you have any questions, ask your nurse or doctor.        STOP taking these medications   cephALEXin 500 MG capsule Commonly known as: KEFLEX Stopped by: Scarlette Shorts, MD   insulin lispro 100 UNIT/ML KwikPen Commonly known as: HUMALOG Stopped by: Scarlette Shorts, MD   traMADol 50 MG tablet Commonly known as: Ultram Stopped by: Scarlette Shorts, MD   Evaristo Bury FlexTouch 100 UNIT/ML FlexTouch Pen Generic drug: insulin degludec Stopped by: Scarlette Shorts, MD     TAKE these medications   acetaminophen 650 MG CR tablet Commonly known as: TYLENOL Take 650-1,300 mg by mouth every 8 (eight) hours as needed for pain.   dapagliflozin propanediol 5 MG Tabs tablet Commonly known as: Farxiga Take 1 tablet (5 mg total) by mouth daily before breakfast. Started by: Scarlette Shorts, MD   Dexcom G6 Sensor Misc 1 Device by Does not apply route as directed. Started by: Scarlette Shorts, MD   Dexcom G6 Transmitter Misc 1 Device by Does not apply route as directed. Started by: Scarlette Shorts, MD   gabapentin 100 MG capsule Commonly known as: NEURONTIN Take 200 mg by mouth 2 (two) times a day.   Insulin Pen Needle 31G X 5 MM Misc 1 Device by Does not apply route as directed.   metFORMIN 500 MG tablet Commonly known as: GLUCOPHAGE Take 2 tablets (1,000 mg total) by mouth 2 (two) times daily with a meal.   sertraline 25 MG tablet Commonly known as: ZOLOFT Take 25 mg by mouth daily.   Toujeo Max SoloStar 300 UNIT/ML Solostar Pen Generic drug: insulin glargine (2 Unit Dial) Inject 46 Units into the skin daily. Started by: Scarlette Shorts, MD   Ventolin HFA 108 (90 Base) MCG/ACT inhaler Generic drug:  albuterol Inhale 2 puffs into the lungs every 6 (six) hours as needed for wheezing or shortness of breath.        OBJECTIVE:   Vital Signs: BP 136/82   Pulse 88   Ht 5\' 7"  (1.702 m)   Wt (!) 309 lb 4 oz (140.3 kg)   SpO2 98%   BMI 48.44 kg/m   Wt Readings from Last 3 Encounters:  04/06/20 (!) 309 lb 4 oz (140.3 kg)  09/01/19 (!) 320 lb (145.2 kg)  05/27/19 (!) 334 lb 6.4 oz (151.7 kg)     Exam: General: Pt appears well and is in NAD  Lungs: Clear with good BS bilat with no rales, rhonchi, or wheezes  Heart: RRR with normal S1 and S2 and no gallops; no murmurs; no rub  Extremities: No pretibial  edema.   Neuro: MS is good with appropriate affect, pt is alert and Ox3       DM foot exam: 05/27/2019  The skin of the feet is intact without sores or ulcerations. The pedal pulses are 2+ on right and 2+ on left. The sensation is absent at the great toes to a screening 5.07, 10 gram monofilament.       DATA REVIEWED:  Lab Results  Component Value Date   HGBA1C 14.0 (A) 04/06/2020   HGBA1C 10.8 (A) 07/22/2018   HGBA1C 11.7 (H) 10/19/2015   Lab Results  Component Value Date   MICROALBUR 4.8 (H) 04/06/2020   LDLCALC 87 04/06/2020   CREATININE 0.67 04/06/2020   Lab Results  Component Value Date   MICRALBCREAT 8.1 04/06/2020     Lab Results  Component Value Date   CHOL 173 04/06/2020   HDL 67.60 04/06/2020   LDLCALC 87 04/06/2020   TRIG 95.0 04/06/2020   CHOLHDL 3 04/06/2020         ASSESSMENT / PLAN / RECOMMENDATIONS:   1) Type 2 Diabetes Mellitus, Poorly controlled, With neuropathic complications - Most recent A1c 14.0% ,  Goal A1c < 7.0 %.  - Pt with sporadic office visits, we discussed annual visit is not sufficient to control or manage her diabetes  - I suspect medication non-adherence as well as dietary indiscretions  - She has new insurance, she would like to try SGLT-2 inhibitors , we discussed risk of genital infections.   - Encouraged  compliance and low carb diet and to avoid snacks    MEDICATIONS:  - Continue Metformin 500 mg 2 tablets twice a day - Increase Tresiba to 45 units daily  - Humalog 16 units BEFORE  each meal ( preferably 20-30 minutes before a meal) - Start Farxiga 5 mg , 1 tablet daily in the morning     EDUCATION / INSTRUCTIONS:  BG monitoring instructions: Patient is instructed to check her blood sugars 2 times a day, before breakfast and Supper.  Call [place] [facility] if: BG persistently < 70 . I reviewed the Rule of 15 for the treatment of hypoglycemia in detail with the patient. Literature supplied.     F/U in 3 months    Signed electronically by: Lyndle Herrlich, MD  [facility] Endocrinology  [facility] [address] [place] [place], [place] [address] Phone: [phone] FAX: [phone]   CC: Verlon Au, MD [address] [place] [address] Phone: [phone]  Fax: [phone]  Return to Endocrinology clinic as below: Future Appointments  Date Time Provider Department Center  08/09/2020  9:30 AM Amazing Cowman, Konrad Dolores, MD [facility] None

## 2020-04-06 NOTE — Patient Instructions (Addendum)
-   Continue Metformin 500 mg 2 tablets twice a day - Increase Tresiba to 45 units daily  - Humalog 16 units BEFORE  each meal ( preferably 20-30 minutes before a meal) - Start Farxiga 5 mg , 1 tablet daily in the morning      HOW TO TREAT LOW BLOOD SUGARS (Blood sugar LESS THAN 70 MG/DL)  Please follow the RULE OF 15 for the treatment of hypoglycemia treatment (when your (blood sugars are less than 70 mg/dL)    STEP 1: Take 15 grams of carbohydrates when your blood sugar is low, which includes:   3-4 GLUCOSE TABS  OR  3-4 OZ OF JUICE OR REGULAR SODA OR  ONE TUBE OF GLUCOSE GEL     STEP 2: RECHECK blood sugar in 15 MINUTES STEP 3: If your blood sugar is still low at the 15 minute recheck --> then, go back to STEP 1 and treat AGAIN with another 15 grams of carbohydrates.

## 2020-04-19 ENCOUNTER — Telehealth: Payer: Self-pay | Admitting: Internal Medicine

## 2020-04-19 NOTE — Telephone Encounter (Signed)
Dexcom start program called regarding a PA that was sent on 3/9 for a Dexcom G6 transmitter and sensors. They have called the pt's insurance and it has not been innidiated yet. I informed them PA takes 7-10 business days to complete.

## 2020-04-20 NOTE — Telephone Encounter (Signed)
PA have been submitted for [facility] transmitter and sensor

## 2020-04-20 NOTE — Telephone Encounter (Signed)
Please advise. PA for Farxiga 5 mg was denied.

## 2020-04-21 NOTE — Telephone Encounter (Signed)
Let her know about farxiga denies. She needs to contact insurance and see what they cover alternatively

## 2020-04-21 NOTE — Telephone Encounter (Signed)
Message left for patient to return my call.  

## 2020-04-25 NOTE — Telephone Encounter (Signed)
LVM for pt to contact her insurance company and see what is an alternative to [place] and let us know.

## 2020-05-01 NOTE — Telephone Encounter (Signed)
DEXCOM called and requested that PA be re-faxed to [phone]

## 2020-08-09 ENCOUNTER — Ambulatory Visit: Payer: Commercial Managed Care - PPO | Admitting: Internal Medicine

## 2020-08-09 NOTE — Progress Notes (Deleted)
Name: [name]  Age/ Sex: 50 y.o., female   MRN/ DOB: [id number], 08-08-1970     PCP: Verlon Au, MD   Reason for Endocrinology Evaluation: Type 2 Diabetes Mellitus  Initial Endocrine Consultative Visit: 07/22/2018    PATIENT IDENTIFIER: [name] is a 50 y.o. female with a past medical history of T2DM, Hx of acute renal failure S/P temporary dialysis in 2017 . The patient has followed with Endocrinology clinic since 07/22/2018 for consultative assistance with management of her diabetes.  DIABETIC HISTORY:  [name] was diagnosed with T2DM in ~ 2000. She was on Ozempic and Novolog but cost was prohibitive, Has been on insulin for years. Her hemoglobin A1c has ranged from 9.8 % in 2014, peaking at 12.9%  in 2011.  Works 3 pm to 11 PM . Wakes up 1 pm    On her initial visit to our clinic her A1c was 10.8%. She was on basal insulin, Metformin and Jardiance  GLP-1 agonists and SGLT-2 inhibitors have been cost prohibitive   SUBJECTIVE:   During the last visit (04/06/2020): A1c 14.0 % , We adjusted MDI regimen, continued Metformin and started [place]   Today (08/09/2020): [name] is here for a follow up on diabetes management.  She checks her blood sugars 3-4 times daily, preprandial. The patient has not had hypoglycemic episodes since the last clinic visit.. She did not bring her meter today     HOME DIABETES REGIMEN:  Metformin 500 mg 2 tablets twice a day Tresiba  45 units daily Humalog 16 units BEFORE  each meal  Farxiga 5 mg , 1 tablet daily in the morning      METER DOWNLOAD SUMMARY: Did not bring     DIABETIC COMPLICATIONS: Microvascular complications:  Neuropathy Denies: CKD, retinopathy  Last eye exam: Completed  2020- hs an appointment 04/11/2020   Macrovascular complications:    Denies: CAD, PVD, CVA    HISTORY:  Past Medical History:  Past Medical History:  Diagnosis Date   Acute renal failure (ARF) (HCC)     Allergy    Depression    Diabetes mellitus    Morbid obesity (HCC)    Past Surgical History:  Past Surgical History:  Procedure Laterality Date   INCISION AND DRAINAGE ABSCESS N/A 10/21/2015   Procedure: dressing change to  PRESACRAL ABCESS;  Surgeon: Romie Levee, MD;  Location: [facility];  Service: General;  Laterality: N/A;  dressing to sacral area   IR GENERIC HISTORICAL  10/27/2015   IR FLUORO GUIDE CV LINE RIGHT 10/27/2015 Gilmer Mor, DO [facility]   IR GENERIC HISTORICAL  10/27/2015   IR US GUIDE VASC ACCESS RIGHT 10/27/2015 Gilmer Mor, DO [facility]   IRRIGATION AND DEBRIDEMENT ABSCESS N/A 10/19/2015   Procedure: IRRIGATION AND DEBRIDEMENT PRESACRAL ABSCESS;  Surgeon: Claud Kelp, MD;  Location: [facility];  Service: General;  Laterality: N/A;   TUBAL LIGATION     Social History:  reports that she has never smoked. She has never used smokeless tobacco. She reports that she does not drink alcohol and does not use drugs. Family History:  Family History  Problem Relation Age of Onset   Diabetes Mother    Lymphoma Father    Lymphoma Brother      HOME MEDICATIONS: Allergies as of 08/09/2020       Reactions   Codeine Shortness Of Breath   Atorvastatin Other (See Comments)   DIFFUSE MYALGIA, RESOLVED AFTER STOPPING RX; SHE TRIED IT  AGAIN BUT DEVELOPED DIFFUSE MYALGIA AND REFUSES A DIFFERENT STATIN TRIAL DIFFUSE MYALGIA, RESOLVED AFTER STOPPING RX; SHE TRIED IT AGAIN BUT DEVELOPED DIFFUSE MYALGIA AND REFUSES A DIFFERENT STATIN TRIAL   Iodinated Diagnostic Agents Other (See Comments)   Kidney shutdown Other reaction(s): Other (See Comments) ACUTE TUBULAR NECROSIS AFTER IV CONTRAST ADMINISTRATION FOR CT ACUTE TUBULAR NECROSIS AFTER IV CONTRAST ADMINISTRATION FOR CT   Metrizamide Other (See Comments)   Kidney shutdown Kidney shutdown        Medication List        Accurate as of August 09, 2020  7:19 AM. If you have any questions, ask your nurse or doctor.           acetaminophen 650 MG CR tablet Commonly known as: TYLENOL Take 650-1,300 mg by mouth every 8 (eight) hours as needed for pain.   dapagliflozin propanediol 5 MG Tabs tablet Commonly known as: Farxiga Take 1 tablet (5 mg total) by mouth daily before breakfast.   Dexcom G6 Sensor Misc 1 Device by Does not apply route as directed.   Dexcom G6 Transmitter Misc 1 Device by Does not apply route as directed.   gabapentin 100 MG capsule Commonly known as: NEURONTIN Take 200 mg by mouth 2 (two) times a day.   Insulin Pen Needle 31G X 5 MM Misc 1 Device by Does not apply route as directed.   metFORMIN 500 MG tablet Commonly known as: GLUCOPHAGE Take 2 tablets (1,000 mg total) by mouth 2 (two) times daily with a meal.   sertraline 25 MG tablet Commonly known as: ZOLOFT Take 25 mg by mouth daily.   Toujeo Max SoloStar 300 UNIT/ML Solostar Pen Generic drug: insulin glargine (2 Unit Dial) Inject 46 Units into the skin daily.   Ventolin HFA 108 (90 Base) MCG/ACT inhaler Generic drug: albuterol Inhale 2 puffs into the lungs every 6 (six) hours as needed for wheezing or shortness of breath.         OBJECTIVE:   Vital Signs: There were no vitals taken for this visit.  Wt Readings from Last 3 Encounters:  04/06/20 (!) 309 lb 4 oz (140.3 kg)  09/01/19 (!) 320 lb (145.2 kg)  05/27/19 (!) 334 lb 6.4 oz (151.7 kg)     Exam: General: Pt appears well and is in NAD  Lungs: Clear with good BS bilat with no rales, rhonchi, or wheezes  Heart: RRR with normal S1 and S2 and no gallops; no murmurs; no rub  Extremities: No pretibial edema.   Neuro: MS is good with appropriate affect, pt is alert and Ox3       DM foot exam: 05/27/2019   The skin of the feet is intact without sores or ulcerations. The pedal pulses are 2+ on right and 2+ on left. The sensation is absent at the great toes to a screening 5.07, 10 gram monofilament.       DATA REVIEWED:  Lab Results  Component  Value Date   HGBA1C 14.0 (A) 04/06/2020   HGBA1C 10.8 (A) 07/22/2018   HGBA1C 11.7 (H) 10/19/2015   Lab Results  Component Value Date   MICROALBUR 4.8 (H) 04/06/2020   LDLCALC 87 04/06/2020   CREATININE 0.67 04/06/2020   Lab Results  Component Value Date   MICRALBCREAT 8.1 04/06/2020     Lab Results  Component Value Date   CHOL 173 04/06/2020   HDL 67.60 04/06/2020   LDLCALC 87 04/06/2020   TRIG 95.0 04/06/2020   CHOLHDL 3 04/06/2020  ASSESSMENT / PLAN / RECOMMENDATIONS:   1) Type 2 Diabetes Mellitus, Poorly controlled, With neuropathic complications - Most recent A1c 14.0% ,  Goal A1c < 7.0 %.  - Pt with sporadic office visits, we discussed annual visit is not sufficient to control or manage her diabetes  - I suspect medication non-adherence as well as dietary indiscretions  - She has new insurance, she would like to try SGLT-2 inhibitors , we discussed risk of genital infections.   - Encouraged compliance and low carb diet and to avoid snacks    MEDICATIONS:  - Continue Metformin 500 mg 2 tablets twice a day - Increase Tresiba to 45 units daily  - Humalog 16 units BEFORE  each meal ( preferably 20-30 minutes before a meal) - Start Farxiga 5 mg , 1 tablet daily in the morning     EDUCATION / INSTRUCTIONS: BG monitoring instructions: Patient is instructed to check her blood sugars 2 times a day, before breakfast and Supper. Call [facility] if: BG persistently < 70 I reviewed the Rule of 15 for the treatment of hypoglycemia in detail with the patient. Literature supplied.     F/U in 3 months    Signed electronically by: Lyndle Herrlich, MD  [facility] Endocrinology  [facility] [address] [place], [place] [address] Phone: [phone] FAX: [phone]   CC: Verlon Au, MD [address] [place] [address] Phone: [phone]  Fax: [phone]  Return to  Endocrinology clinic as below: Future Appointments  Date Time Provider Department Center  08/09/2020  9:30 AM [name], Konrad Dolores, MD [id number] None

## 2020-08-23 ENCOUNTER — Emergency Department (HOSPITAL_BASED_OUTPATIENT_CLINIC_OR_DEPARTMENT_OTHER): Payer: Commercial Managed Care - PPO | Admitting: Radiology

## 2020-08-23 ENCOUNTER — Encounter (HOSPITAL_BASED_OUTPATIENT_CLINIC_OR_DEPARTMENT_OTHER): Payer: Self-pay | Admitting: Emergency Medicine

## 2020-08-23 ENCOUNTER — Emergency Department (HOSPITAL_BASED_OUTPATIENT_CLINIC_OR_DEPARTMENT_OTHER)
Admission: EM | Admit: 2020-08-23 | Discharge: 2020-08-23 | Disposition: A | Discharge status: Home / Self Care | Payer: Commercial Managed Care - PPO | Attending: Emergency Medicine | Admitting: Emergency Medicine

## 2020-08-23 ENCOUNTER — Other Ambulatory Visit: Payer: Self-pay

## 2020-08-23 DIAGNOSIS — Z7984 Long term (current) use of oral hypoglycemic drugs: Secondary | ICD-10-CM | POA: Insufficient documentation

## 2020-08-23 DIAGNOSIS — Z794 Long term (current) use of insulin: Secondary | ICD-10-CM | POA: Diagnosis not present

## 2020-08-23 DIAGNOSIS — I739 Peripheral vascular disease, unspecified: Secondary | ICD-10-CM | POA: Insufficient documentation

## 2020-08-23 DIAGNOSIS — E1169 Type 2 diabetes mellitus with other specified complication: Secondary | ICD-10-CM | POA: Diagnosis not present

## 2020-08-23 DIAGNOSIS — Z20822 Contact with and (suspected) exposure to covid-19: Secondary | ICD-10-CM | POA: Diagnosis not present

## 2020-08-23 DIAGNOSIS — Z79899 Other long term (current) drug therapy: Secondary | ICD-10-CM | POA: Diagnosis not present

## 2020-08-23 DIAGNOSIS — M79675 Pain in left toe(s): Secondary | ICD-10-CM | POA: Diagnosis present

## 2020-08-23 LAB — CBC WITH DIFFERENTIAL/PLATELET
Abs Immature Granulocytes: 0 10*3/uL (ref 0.00–0.07)
Basophils Absolute: 0 10*3/uL (ref 0.0–0.1)
Basophils Relative: 1 %
Eosinophils Absolute: 0.1 10*3/uL (ref 0.0–0.5)
Eosinophils Relative: 3 %
HCT: 35.4 % — ABNORMAL LOW (ref 36.0–46.0)
Hemoglobin: 11 g/dL — ABNORMAL LOW (ref 12.0–15.0)
Immature Granulocytes: 0 %
Lymphocytes Relative: 38 %
Lymphs Abs: 1.4 10*3/uL (ref 0.7–4.0)
MCH: 23.6 pg — ABNORMAL LOW (ref 26.0–34.0)
MCHC: 31.1 g/dL (ref 30.0–36.0)
MCV: 76 fL — ABNORMAL LOW (ref 80.0–100.0)
Monocytes Absolute: 0.3 10*3/uL (ref 0.1–1.0)
Monocytes Relative: 7 %
Neutro Abs: 1.9 10*3/uL (ref 1.7–7.7)
Neutrophils Relative %: 51 %
Platelets: 233 10*3/uL (ref 150–400)
RBC: 4.66 MIL/uL (ref 3.87–5.11)
RDW: 20.9 % — ABNORMAL HIGH (ref 11.5–15.5)
WBC: 3.7 10*3/uL — ABNORMAL LOW (ref 4.0–10.5)
nRBC: 0 % (ref 0.0–0.2)

## 2020-08-23 LAB — COMPREHENSIVE METABOLIC PANEL
ALT: 17 U/L (ref 0–44)
AST: 27 U/L (ref 15–41)
Albumin: 3.6 g/dL (ref 3.5–5.0)
Alkaline Phosphatase: 140 U/L — ABNORMAL HIGH (ref 38–126)
Anion gap: 7 (ref 5–15)
BUN: 19 mg/dL (ref 6–20)
CO2: 27 mmol/L (ref 22–32)
Calcium: 9.2 mg/dL (ref 8.9–10.3)
Chloride: 100 mmol/L (ref 98–111)
Creatinine, Ser: 0.8 mg/dL (ref 0.44–1.00)
GFR, Estimated: >60 mL/min (ref >60)
Glucose, Bld: 293 mg/dL — ABNORMAL HIGH (ref 70–99)
Potassium: 4.4 mmol/L (ref 3.5–5.1)
Sodium: 134 mmol/L — ABNORMAL LOW (ref 135–145)
Total Bilirubin: 0.4 mg/dL (ref 0.3–1.2)
Total Protein: 8.2 g/dL — ABNORMAL HIGH (ref 6.5–8.1)

## 2020-08-23 LAB — PROTIME-INR
INR: 0.9 (ref 0.8–1.2)
Prothrombin Time: 12.5 seconds (ref 11.4–15.2)

## 2020-08-23 LAB — RESP PANEL BY RT-PCR (FLU A&B, COVID) ARPGX2
Influenza A by PCR: NEGATIVE
Influenza B by PCR: NEGATIVE
SARS Coronavirus 2 by RT PCR: NEGATIVE

## 2020-08-23 NOTE — ED Provider Notes (Signed)
[facility] EMERGENCY DEPT Provider Note   CSN: [id number] Arrival date & time: 08/23/20  1532     History Chief Complaint  Patient presents with   Toe Injury    [name] is a 50 y.o. female.  She has neuropathy but is unaware of any injury to this toe.  The toe has become more painful and discolored throughout the week.  The history is provided by the patient.  Toe Pain This is a new problem. Episode onset: 4 days. The problem occurs constantly. The problem has not changed since onset.Pertinent negatives include no chest pain, no abdominal pain, no headaches and no shortness of breath. Nothing aggravates the symptoms. Nothing relieves the symptoms. She has tried nothing for the symptoms. The treatment provided no relief.      Past Medical History:  Diagnosis Date   Acute renal failure (ARF) (HCC)    Allergy    Depression    Diabetes mellitus    Morbid obesity (HCC)     Patient Active Problem List   Diagnosis Date Noted   Type 2 diabetes mellitus with hyperglycemia, with long-term current use of insulin (HCC) 05/27/2019   ATN (acute tubular necrosis) (HCC) 10/31/2015   Cellulitis 10/19/2015   Necrotizing soft tissue infection 10/19/2015   Acute sinus infection 07/07/2013   Gall stones 07/07/2013   Osteoarthritis 03/09/2013   Transaminitis 01/26/2013   Depression 01/26/2013   DM type 2 goal A1C below 7.5 01/26/2013   Diabetes mellitus (HCC) 07/15/2012   Disorder of bone and cartilage 07/15/2012   Morbid obesity (HCC) 07/15/2012   Other malaise and fatigue 07/15/2012    Past Surgical History:  Procedure Laterality Date   INCISION AND DRAINAGE ABSCESS N/A 10/21/2015   Procedure: dressing change to  PRESACRAL ABCESS;  Surgeon: Romie Levee, MD;  Location: [facility];  Service: General;  Laterality: N/A;  dressing to sacral area   IR GENERIC HISTORICAL  10/27/2015   IR FLUORO GUIDE CV LINE RIGHT 10/27/2015 Gilmer Mor, DO [facility]   IR  GENERIC HISTORICAL  10/27/2015   IR US GUIDE VASC ACCESS RIGHT 10/27/2015 Gilmer Mor, DO [facility]   IRRIGATION AND DEBRIDEMENT ABSCESS N/A 10/19/2015   Procedure: IRRIGATION AND DEBRIDEMENT PRESACRAL ABSCESS;  Surgeon: Claud Kelp, MD;  Location: [facility];  Service: General;  Laterality: N/A;   TUBAL LIGATION       OB History   No obstetric history on file.     Family History  Problem Relation Age of Onset   Diabetes Mother    Lymphoma Father    Lymphoma Brother     Social History   Tobacco Use   Smoking status: Never   Smokeless tobacco: Never  Substance Use Topics   Alcohol use: No   Drug use: No    Home Medications Prior to Admission medications   Medication Sig Start Date End Date Taking? Authorizing Provider  acetaminophen (TYLENOL) 650 MG CR tablet Take 650-1,300 mg by mouth every 8 (eight) hours as needed for pain.    [provider]  albuterol (VENTOLIN HFA) 108 (90 Base) MCG/ACT inhaler Inhale 2 puffs into the lungs every 6 (six) hours as needed for wheezing or shortness of breath.  06/15/15 06/14/16  [provider]  Continuous Blood Gluc Sensor (DEXCOM G6 SENSOR) MISC 1 Device by Does not apply route as directed. 04/06/20   Shamleffer, Konrad Dolores, MD  Continuous Blood Gluc Transmit (DEXCOM G6 TRANSMITTER) MISC 1 Device by Does not apply route  as directed. 04/06/20   Shamleffer, Konrad Dolores, MD  dapagliflozin propanediol (FARXIGA) 5 MG TABS tablet Take 1 tablet (5 mg total) by mouth daily before breakfast. 04/06/20   Shamleffer, Konrad Dolores, MD  gabapentin (NEURONTIN) 100 MG capsule Take 200 mg by mouth 2 (two) times a day.    [provider]  insulin glargine, 2 Unit Dial, (TOUJEO MAX SOLOSTAR) 300 UNIT/ML Solostar Pen Inject 46 Units into the skin daily. 04/06/20   Shamleffer, Konrad Dolores, MD  Insulin Pen Needle 31G X 5 MM MISC 1 Device by Does not apply route as directed. 05/27/19   Shamleffer, Konrad Dolores, MD  metFORMIN  (GLUCOPHAGE) 500 MG tablet Take 2 tablets (1,000 mg total) by mouth 2 (two) times daily with a meal. 05/27/19   Shamleffer, Konrad Dolores, MD  sertraline (ZOLOFT) 25 MG tablet Take 25 mg by mouth daily.    [provider]    Allergies    Codeine, Atorvastatin, Iodinated diagnostic agents, and Metrizamide  Review of Systems   Review of Systems  Constitutional:  Negative for chills and fever.  HENT:  Negative for ear pain and sore throat.   Eyes:  Negative for pain and visual disturbance.  Respiratory:  Negative for cough and shortness of breath.   Cardiovascular:  Negative for chest pain and palpitations.  Gastrointestinal:  Negative for abdominal pain and vomiting.  Genitourinary:  Negative for dysuria and hematuria.  Musculoskeletal:  Negative for arthralgias and back pain.  Skin:  Positive for color change. Negative for rash.  Neurological:  Negative for seizures, syncope and headaches.  All other systems reviewed and are negative.  Physical Exam Updated Vital Signs BP (!) 169/90 (BP Location: Right Arm)   Pulse 70   Temp 98 F (36.7 C) (Oral)   Resp 16   Ht 5\' 7"  (1.702 m)   Wt 132 kg   SpO2 100%   BMI 45.58 kg/m   Physical Exam Vitals and nursing note reviewed.  HENT:     Head: Normocephalic and atraumatic.  Eyes:     General: No scleral icterus. Pulmonary:     Effort: Pulmonary effort is normal. No respiratory distress.  Musculoskeletal:     Cervical back: Normal range of motion.     Comments: The left third toe has some dark, stippled discoloration especially on the plantar aspect.  Mild, diffuse tenderness to palpation. Cap refill is slightly delayed but not significantly so when compared to the other toes.  Temperature is similar to the other toes.  Dorsalis pedis pulses are palpable, but posterior tibialis pulses are stronger.  Sensation is intact.  Skin:    General: Skin is warm and dry.  Neurological:     General: No focal deficit present.      Mental Status: She is alert and oriented to person, place, and time.  Psychiatric:        Mood and Affect: Mood normal.    ED Results / Procedures / Treatments   Labs (all labs ordered are listed, but only abnormal results are displayed) Labs Reviewed  COMPREHENSIVE METABOLIC PANEL - Abnormal; Notable for the following components:      Result Value   Sodium 134 (*)    Glucose, Bld 293 (*)    Total Protein 8.2 (*)    Alkaline Phosphatase 140 (*)    All other components within normal limits  CBC WITH DIFFERENTIAL/PLATELET - Abnormal; Notable for the following components:   WBC 3.7 (*)    Hemoglobin 11.0 (*)  HCT 35.4 (*)    MCV 76.0 (*)    MCH 23.6 (*)    RDW 20.9 (*)    All other components within normal limits  RESP PANEL BY RT-PCR (FLU A&B, COVID) ARPGX2  PROTIME-INR    EKG None  Radiology DG Toe 3rd Left  Result Date: 08/23/2020 CLINICAL DATA:  Atraumatic pain. , limited ROM to middle toe on left foot. NKI. Pt reports that the toe has been painful all week. EXAM: LEFT THIRD TOE COMPARISON:  None. FINDINGS: No evidence of fracture or dislocation. Degenerative changes the proximal interphalangeal joints which appears most severe at third digit. No aggressive appearing focal bone abnormality. Soft tissues are unremarkable. IMPRESSION: 1. Poorly visualized degenerative changes of the proximal interphalangeal joint of the third digit. 2.  No acute displaced fracture or dislocation. Electronically Signed   By: Tish Frederickson M.D.   On: 08/23/2020 21:00    Procedures Procedures   Medications Ordered in ED Medications - No data to display  ED Course  I have reviewed the triage vital signs and the nursing notes.  Pertinent labs & imaging results that were available during my care of the patient were reviewed by me and considered in my medical decision making (see chart for details).  Clinical Course as of 08/23/20 2259  Wed Aug 23, 2020  2104 Dr. Randie Heinz with vascular will  arrange follow-up likely on Friday. [AW]    Clinical Course User Index [AW] Koleen Distance, MD   MDM Rules/Calculators/A&P                           Suezanne Jacquet Muldrew presented with a dark, painful left third toe.  I was concerned about peripheral artery disease/vascular occlusion.  She did have palpable distal pulses.  She has a contrast allergy dye and cannot receive a CT scan.  [place], where she is, we have no access to arterial studies.  At this point, and in order to provide good care for the patient as well as conservative system resources, I talked to vascular surgery for recommendations and possibly close clinic follow-up.  The patient can be seen in clinic.  She will be discharged home.  Other possibilities on the differential included trauma, soft tissue infection. Final Clinical Impression(s) / ED Diagnoses Final diagnoses:  Peripheral artery disease [facility])    Rx / DC Orders ED Discharge Orders     None        Koleen Distance, MD 08/23/20 2303

## 2020-08-23 NOTE — ED Triage Notes (Signed)
Pt arrives to ED with c/o of injury to middle toe on left foot. Pt reports that the toe has been painful all week. Pt denies injury to it, however has hx of diabetic neuropathy.

## 2020-08-24 ENCOUNTER — Other Ambulatory Visit (HOSPITAL_COMMUNITY): Payer: Self-pay | Admitting: Vascular Surgery

## 2020-08-24 DIAGNOSIS — I739 Peripheral vascular disease, unspecified: Secondary | ICD-10-CM

## 2020-08-25 ENCOUNTER — Encounter: Payer: Self-pay | Admitting: Vascular Surgery

## 2020-08-25 ENCOUNTER — Ambulatory Visit (HOSPITAL_COMMUNITY)
Admission: RE | Admit: 2020-08-25 | Discharge: 2020-08-25 | Disposition: A | Discharge status: Home / Self Care | Payer: Commercial Managed Care - PPO | Source: Ambulatory Visit | Attending: Vascular Surgery | Admitting: Vascular Surgery

## 2020-08-25 ENCOUNTER — Ambulatory Visit (INDEPENDENT_AMBULATORY_CARE_PROVIDER_SITE_OTHER): Payer: Commercial Managed Care - PPO | Admitting: Vascular Surgery

## 2020-08-25 ENCOUNTER — Other Ambulatory Visit: Payer: Self-pay

## 2020-08-25 VITALS — BP 136/83 | HR 70 | Temp 97.9°F | Resp 20 | Ht 67.0 in | Wt 301.0 lb

## 2020-08-25 DIAGNOSIS — I739 Peripheral vascular disease, unspecified: Secondary | ICD-10-CM | POA: Diagnosis not present

## 2020-08-25 DIAGNOSIS — I75022 Atheroembolism of left lower extremity: Secondary | ICD-10-CM | POA: Diagnosis not present

## 2020-08-25 NOTE — Progress Notes (Signed)
Patient ID: [name], female   DOB: [birth year], 50 y.o.   MRN: [id number]  Reason for Consult: New Patient (Initial Visit)   Referred by Verlon Au, MD  Subjective:     HPI:  [name] is a 50 y.o. female without previous vascular disease.  She does have diabetes mellitus as a risk factor.  This Wednesday she developed pain in her left middle toe.  She noted that it was discolored.  She presented to droppage where she was evaluated noted to have palpable pulses.  A CT scan cannot be performed due to listed contrast allergy with history of acute renal failure after contrast.  She now follows up stating that the pain is somewhat better but that her tire continues to darken.  She denies any history of injury and is really not have any other toe involvement.  She has not noted any funny heartbeats recently.  She has no new back or abdominal pain.  Past Medical History:  Diagnosis Date   Acute renal failure (ARF) (HCC)    Allergy    Depression    Diabetes mellitus    Morbid obesity (HCC)    Family History  Problem Relation Age of Onset   Diabetes Mother    Lymphoma Father    Lymphoma Brother    Past Surgical History:  Procedure Laterality Date   INCISION AND DRAINAGE ABSCESS N/A 10/21/2015   Procedure: dressing change to  PRESACRAL ABCESS;  Surgeon: Romie Levee, MD;  Location: [facility];  Service: General;  Laterality: N/A;  dressing to sacral area   IR GENERIC HISTORICAL  10/27/2015   IR FLUORO GUIDE CV LINE RIGHT 10/27/2015 Gilmer Mor, DO [facility]   IR GENERIC HISTORICAL  10/27/2015   IR US GUIDE VASC ACCESS RIGHT 10/27/2015 Gilmer Mor, DO [facility]   IRRIGATION AND DEBRIDEMENT ABSCESS N/A 10/19/2015   Procedure: IRRIGATION AND DEBRIDEMENT PRESACRAL ABSCESS;  Surgeon: Claud Kelp, MD;  Location: [facility];  Service: General;  Laterality: N/A;   TUBAL LIGATION      Short Social History:  Social History   Tobacco Use   Smoking status: Never    Smokeless tobacco: Never  Substance Use Topics   Alcohol use: No    Allergies  Allergen Reactions   Codeine Shortness Of Breath   Atorvastatin Other (See Comments)    DIFFUSE MYALGIA, RESOLVED AFTER STOPPING RX; SHE TRIED IT AGAIN BUT DEVELOPED DIFFUSE MYALGIA AND REFUSES A DIFFERENT STATIN TRIAL DIFFUSE MYALGIA, RESOLVED AFTER STOPPING RX; SHE TRIED IT AGAIN BUT DEVELOPED DIFFUSE MYALGIA AND REFUSES A DIFFERENT STATIN TRIAL    Iodinated Diagnostic Agents Other (See Comments)    Kidney shutdown Other reaction(s): Other (See Comments) ACUTE TUBULAR NECROSIS AFTER IV CONTRAST ADMINISTRATION FOR CT ACUTE TUBULAR NECROSIS AFTER IV CONTRAST ADMINISTRATION FOR CT    Metrizamide Other (See Comments)    Kidney shutdown Kidney shutdown     Current Outpatient Medications  Medication Sig Dispense Refill   acetaminophen (TYLENOL) 650 MG CR tablet Take 650-1,300 mg by mouth every 8 (eight) hours as needed for pain.     Continuous Blood Gluc Sensor (DEXCOM G6 SENSOR) MISC 1 Device by Does not apply route as directed. 3 each 11   Continuous Blood Gluc Transmit (DEXCOM G6 TRANSMITTER) MISC 1 Device by Does not apply route as directed. 1 each 3   dapagliflozin propanediol (FARXIGA) 5 MG TABS tablet Take 1 tablet (5 mg total) by mouth daily before breakfast. 30 tablet 6  gabapentin (NEURONTIN) 100 MG capsule Take 200 mg by mouth 2 (two) times a day.     insulin glargine, 2 Unit Dial, (TOUJEO MAX SOLOSTAR) 300 UNIT/ML Solostar Pen Inject 46 Units into the skin daily. 15 mL 3   Insulin Pen Needle 31G X 5 MM MISC 1 Device by Does not apply route as directed. 150 each 11   metFORMIN (GLUCOPHAGE) 500 MG tablet Take 2 tablets (1,000 mg total) by mouth 2 (two) times daily with a meal. 360 tablet 3   sertraline (ZOLOFT) 25 MG tablet Take 25 mg by mouth daily.     albuterol (VENTOLIN HFA) 108 (90 Base) MCG/ACT inhaler Inhale 2 puffs into the lungs every 6 (six) hours as needed for wheezing or shortness  of breath.      No current facility-administered medications for this visit.    Review of Systems  Constitutional:  Constitutional negative. HENT: HENT negative.  Eyes: Eyes negative.  Respiratory: Respiratory negative.  Cardiovascular: Cardiovascular negative.  GI: Gastrointestinal negative.  Musculoskeletal:       Toe pain Skin: Skin negative.  Neurological: Neurological negative. Hematologic: Hematologic/lymphatic negative.  Psychiatric: Psychiatric negative.       Objective:  Objective   Vitals:   08/25/20 1441  BP: 136/83  Pulse: 70  Resp: 20  Temp: 97.9 F (36.6 C)  SpO2: 96%  Weight: (!) 301 lb (136.5 kg)  Height: 5\' 7"  (1.702 m)   Body mass index is 47.14 kg/m.  Physical Exam HENT:     Head: Normocephalic.     Nose:     Comments: Wearing a mask Neck:     Vascular: No carotid bruit.  Cardiovascular:     Rate and Rhythm: Normal rate.     Pulses:          Radial pulses are 2+ on the right side and 2+ on the left side.       Dorsalis pedis pulses are 2+ on the right side and 2+ on the left side.       Posterior tibial pulses are 2+ on the right side and 2+ on the left side.     Heart sounds: Normal heart sounds.  Pulmonary:     Effort: Pulmonary effort is normal.     Breath sounds: Normal breath sounds.  Abdominal:     General: Abdomen is flat.     Palpations: Abdomen is soft.  Musculoskeletal:        General: Normal range of motion.     Comments: There is purple discoloration of the left middle toe with dark discoloration on the plantar aspect of the toe  Skin:    General: Skin is warm.     Capillary Refill: Capillary refill takes less than 2 seconds.  Neurological:     General: No focal deficit present.     Mental Status: She is alert.  Psychiatric:        Mood and Affect: Mood normal.        Behavior: Behavior normal.        Thought Content: Thought content normal.    Data: ABI Findings:   +---------+------------------+-----+---------+--------+  Right    Rt Pressure (mmHg)IndexWaveform Comment   +---------+------------------+-----+---------+--------+  Brachial 145                                       +---------+------------------+-----+---------+--------+  PTA      207  1.33 triphasic          +---------+------------------+-----+---------+--------+  DP       210               1.35 triphasic          +---------+------------------+-----+---------+--------+  Great Toe135               0.87 Normal             +---------+------------------+-----+---------+--------+   +---------+------------------+-----+---------+-------+  Left     Lt Pressure (mmHg)IndexWaveform Comment  +---------+------------------+-----+---------+-------+  Brachial 156                                      +---------+------------------+-----+---------+-------+  PTA      186               1.19 triphasic         +---------+------------------+-----+---------+-------+  DP       212               1.36 triphasic         +---------+------------------+-----+---------+-------+  Great Toe171               1.10 Normal            +---------+------------------+-----+---------+-------+   +-------+-----------+-----------+------------+------------+  ABI/TBIToday's ABIToday's TBIPrevious ABIPrevious TBI  +-------+-----------+-----------+------------+------------+  Right  1.35       0.87                                 +-------+-----------+-----------+------------+------------+  Left   1.36       1.10                                 +-------+-----------+-----------+------------+------------+          Assessment/Plan:     50 year old female with discoloration of left middle toe unrelated to trauma.  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  Patient has had acute renal failure requiring dialysis in the past and is  adamant she not get contrast dye for CT scan.  We will refer to cardiology for embolic work-up from a cardiac source we will also get aortoiliac duplex although her size may limit the visualization and we will get left lower extremity duplex.  I have asked her to begin taking baby aspirin as well which she is agreeable to.  I discussed with her this may not be vascular in etiology however work-up is more than likely going to be negative.  If she does have progressive changes to other toes or other concern for embolic phenomena we would need to hospitalize her to hydrate her and get CT scan for full evaluation.  She will otherwise follow-up in 2 to 3 weeks with the above-noted studies.     Maeola Harman MD [facility]

## 2020-08-26 ENCOUNTER — Other Ambulatory Visit: Payer: Self-pay | Admitting: Internal Medicine

## 2020-08-28 ENCOUNTER — Other Ambulatory Visit: Payer: Self-pay | Admitting: *Deleted

## 2020-08-28 DIAGNOSIS — I75022 Atheroembolism of left lower extremity: Secondary | ICD-10-CM

## 2020-09-22 ENCOUNTER — Ambulatory Visit: Payer: Commercial Managed Care - PPO | Admitting: Vascular Surgery

## 2020-09-22 ENCOUNTER — Encounter: Payer: Self-pay | Admitting: *Deleted

## 2020-09-22 ENCOUNTER — Other Ambulatory Visit: Payer: Self-pay

## 2020-09-22 ENCOUNTER — Ambulatory Visit (INDEPENDENT_AMBULATORY_CARE_PROVIDER_SITE_OTHER)
Admission: RE | Admit: 2020-09-22 | Discharge: 2020-09-22 | Disposition: A | Discharge status: Home / Self Care | Payer: Commercial Managed Care - PPO | Source: Ambulatory Visit | Attending: Vascular Surgery | Admitting: Vascular Surgery

## 2020-09-22 ENCOUNTER — Ambulatory Visit (HOSPITAL_COMMUNITY)
Admission: RE | Admit: 2020-09-22 | Discharge: 2020-09-22 | Disposition: A | Discharge status: Home / Self Care | Payer: Commercial Managed Care - PPO | Source: Ambulatory Visit | Attending: Vascular Surgery | Admitting: Vascular Surgery

## 2020-09-22 ENCOUNTER — Encounter: Payer: Self-pay | Admitting: Vascular Surgery

## 2020-09-22 VITALS — BP 158/92 | HR 68 | Temp 97.7°F | Resp 20 | Ht 67.0 in | Wt 303.0 lb

## 2020-09-22 DIAGNOSIS — I75022 Atheroembolism of left lower extremity: Secondary | ICD-10-CM

## 2020-09-22 NOTE — Progress Notes (Signed)
Patient ID: [name], female   DOB: [birth year], 50 y.o.   MRN: [id number]  Reason for Consult: Follow-up   Referred by Verlon AuBoyd, Tammy Lamonica, MD  Subjective:     HPI:  [name] is a 50 y.o. female without significant previous vascular disease.  She does have risk factors of diabetes.  I first saw her 4 weeks ago with pain in her left middle toe and discoloration.  She cannot have a CT scan due to severe allergic reaction with acute renal failure.  She now represents with continued left middle toe pain.  We have chosen for duplex scanning.  She has initiated aspirin and is taking this religiously.  She has no other toes affected.  Again she remembers no trauma of the left foot.  Past Medical History:  Diagnosis Date   Acute renal failure (ARF) (HCC)    Allergy    Depression    Diabetes mellitus    Morbid obesity (HCC)    Family History  Problem Relation Age of Onset   Diabetes Mother    Lymphoma Father    Lymphoma Brother    Past Surgical History:  Procedure Laterality Date   INCISION AND DRAINAGE ABSCESS N/A 10/21/2015   Procedure: dressing change to  PRESACRAL ABCESS;  Surgeon: Romie LeveeAlicia Thomas, MD;  Location: [facility];  Service: General;  Laterality: N/A;  dressing to sacral area   IR GENERIC HISTORICAL  10/27/2015   IR FLUORO GUIDE CV LINE RIGHT 10/27/2015 Gilmer MorJaime Wagner, DO [facility]   IR GENERIC HISTORICAL  10/27/2015   IR US GUIDE VASC ACCESS RIGHT 10/27/2015 Gilmer MorJaime Wagner, DO [facility]   IRRIGATION AND DEBRIDEMENT ABSCESS N/A 10/19/2015   Procedure: IRRIGATION AND DEBRIDEMENT PRESACRAL ABSCESS;  Surgeon: Claud KelpHaywood Ingram, MD;  Location: [facility];  Service: General;  Laterality: N/A;   TUBAL LIGATION      Short Social History:  Social History   Tobacco Use   Smoking status: Never   Smokeless tobacco: Never  Substance Use Topics   Alcohol use: No    Allergies  Allergen Reactions   Codeine Shortness Of Breath   Atorvastatin Other (See Comments)     DIFFUSE MYALGIA, RESOLVED AFTER STOPPING RX; SHE TRIED IT AGAIN BUT DEVELOPED DIFFUSE MYALGIA AND REFUSES A DIFFERENT STATIN TRIAL DIFFUSE MYALGIA, RESOLVED AFTER STOPPING RX; SHE TRIED IT AGAIN BUT DEVELOPED DIFFUSE MYALGIA AND REFUSES A DIFFERENT STATIN TRIAL    Iodinated Diagnostic Agents Other (See Comments)    Kidney shutdown Other reaction(s): Other (See Comments) ACUTE TUBULAR NECROSIS AFTER IV CONTRAST ADMINISTRATION FOR CT ACUTE TUBULAR NECROSIS AFTER IV CONTRAST ADMINISTRATION FOR CT    Metrizamide Other (See Comments)    Kidney shutdown Kidney shutdown     Current Outpatient Medications  Medication Sig Dispense Refill   acetaminophen (TYLENOL) 650 MG CR tablet Take 650-1,300 mg by mouth every 8 (eight) hours as needed for pain.     Continuous Blood Gluc Sensor (DEXCOM G6 SENSOR) MISC 1 Device by Does not apply route as directed. 3 each 11   Continuous Blood Gluc Transmit (DEXCOM G6 TRANSMITTER) MISC 1 Device by Does not apply route as directed. 1 each 3   dapagliflozin propanediol (FARXIGA) 5 MG TABS tablet Take 1 tablet (5 mg total) by mouth daily before breakfast. 30 tablet 6   gabapentin (NEURONTIN) 100 MG capsule Take 200 mg by mouth 2 (two) times a day.     insulin glargine, 2 Unit Dial, (TOUJEO MAX SOLOSTAR) 300 UNIT/ML Solostar  Pen Inject 46 Units into the skin daily. 15 mL 3   Insulin Pen Needle 31G X 5 MM MISC 1 Device by Does not apply route as directed. 150 each 11   metFORMIN (GLUCOPHAGE) 500 MG tablet TAKE 2 TABLETS (1,000 MG TOTAL) BY MOUTH 2 (TWO) TIMES DAILY WITH A MEAL. 360 tablet 3   sertraline (ZOLOFT) 25 MG tablet Take 25 mg by mouth daily.     albuterol (VENTOLIN HFA) 108 (90 Base) MCG/ACT inhaler Inhale 2 puffs into the lungs every 6 (six) hours as needed for wheezing or shortness of breath.      No current facility-administered medications for this visit.    Review of Systems  Constitutional:  Constitutional negative. HENT: HENT negative.  Eyes:  Eyes negative.  Respiratory: Respiratory negative.  Cardiovascular: Cardiovascular negative.  GI: Gastrointestinal negative.  Musculoskeletal:       Left middle toe pain and discoloration Skin: Skin negative.  Neurological: Neurological negative. Hematologic: Hematologic/lymphatic negative.  Psychiatric: Psychiatric negative.       Objective:  Objective   Vitals:   09/22/20 0913  BP: (!) 158/92  Pulse: 68  Resp: 20  Temp: 97.7 F (36.5 C)  SpO2: 97%  Weight: (!) 303 lb (137.4 kg)  Height: 5\' 7"  (1.702 m)   Body mass index is 47.46 kg/m.  Physical Exam HENT:     Head: Normocephalic.     Nose: Nose normal.  Cardiovascular:     Pulses: Normal pulses.  Abdominal:     General: Abdomen is flat.     Palpations: Abdomen is soft. There is no mass.  Musculoskeletal:        General: Normal range of motion.     Comments: Left middle toe has dark discoloration and is tender to palpation  Skin:    General: Skin is warm.     Capillary Refill: Capillary refill takes 2 to 3 seconds.  Neurological:     General: No focal deficit present.     Mental Status: She is alert.  Psychiatric:        Mood and Affect: Mood normal.        Behavior: Behavior normal.        Thought Content: Thought content normal.        Judgment: Judgment normal.    Data: Abdominal Aorta Findings:  +-------------+-------+----------+----------+---------+--------+--------+  Location     AP (cm)Trans (cm)PSV (cm/s)Waveform ThrombusComments  +-------------+-------+----------+----------+---------+--------+--------+  Proximal     1.80   1.92      121                                  +-------------+-------+----------+----------+---------+--------+--------+  Mid          1.80   1.97      120                                  +-------------+-------+----------+----------+---------+--------+--------+  Distal       1.64   1.62      90                                    +-------------+-------+----------+----------+---------+--------+--------+  RT CIA Prox  1.1    1.1       93        triphasic                  +-------------+-------+----------+----------+---------+--------+--------+  RT EIA Distal                 179       triphasic                  +-------------+-------+----------+----------+---------+--------+--------+  LT CIA Prox  1.1    1.2       145       triphasic                  +-------------+-------+----------+----------+---------+--------+--------+  LT EIA Distal                 149       triphasic                  +-------------+-------+----------+----------+---------+--------+--------+     Summary:  Abdominal Aorta: No evidence of an abdominal aortic aneurysm was  visualized. No obvious stenosis or emboli source seen in aorto - iliac  arteries.   ABI Findings:  +---------+------------------+-----+---------+--------+  Right    Rt Pressure (mmHg)IndexWaveform Comment   +---------+------------------+-----+---------+--------+  Brachial 138                                       +---------+------------------+-----+---------+--------+  PTA      160               1.10 triphasic          +---------+------------------+-----+---------+--------+  DP       178               1.23 triphasic          +---------+------------------+-----+---------+--------+  Great Toe144               0.99                    +---------+------------------+-----+---------+--------+   +---------+------------------+-----+---------+-------+  Left     Lt Pressure (mmHg)IndexWaveform Comment  +---------+------------------+-----+---------+-------+  Brachial 145                                      +---------+------------------+-----+---------+-------+  PTA      162               1.12 biphasic          +---------+------------------+-----+---------+-------+  DP       178               1.23  triphasic         +---------+------------------+-----+---------+-------+  Great Toe118               0.81                   +---------+------------------+-----+---------+-------+   +-------+-----------+-----------+------------+------------+  ABI/TBIToday's ABIToday's TBIPrevious ABIPrevious TBI  +-------+-----------+-----------+------------+------------+  Right  1.23       0.99       1.35        0.87          +-------+-----------+-----------+------------+------------+  Left   1.23       0.81       1.36        1.10          +-------+-----------+-----------+------------+------------+   +---------------+-------+-----------+--------+--------+-----+--------+  Right PoplitealAP (cm)Transv (cm)WaveformStenosisShapeComments  +---------------+-------+-----------+--------+--------+-----+--------+  Proximal       0.73   0.62                                      +---------------+-------+-----------+--------+--------+-----+--------+   +--------------+-------+-----------+--------+--------+-----+--------+  Left PoplitealAP (cm)Transv (cm)WaveformStenosisShapeComments  +--------------+-------+-----------+--------+--------+-----+--------+  Proximal      0.52   0.57                                      +--------------+-------+-----------+--------+--------+-----+--------+       +----------+--------+-----+--------+---------+------------+  LEFT      PSV cm/sRatioStenosisWaveform Comments                                                                                                    Max diameter  +----------+--------+-----+--------+---------+------------+  CFA [id number]                  triphasic0.876 cm      +----------+--------+-----+--------+---------+------------+  SFA Mid   115                  triphasic0.759 cm      +----------+--------+-----+--------+---------+------------+  POP Prox  82                    triphasic              +----------+--------+-----+--------+---------+------------+  ATA Distal85                   triphasic              +----------+--------+-----+--------+---------+------------+  PTA Distal78                   triphasic              +----------+--------+-----+--------+---------+------------+   Irregular calcified plaque seen in distal anterior tibial and dorsalis  pedis arteries.     Summary:  Right: No obvious aneurysm or hemodynamically significant stenosis.   Left: No obvious aneurysm or hemodynamically significant stenosis.   Irregular calcified plaque is anterior tibial artery noted which may be a  source of emboli.            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  Nothing irregular to suggest embolic disease on ultrasound although her chest was not evaluated patient is adamant she not get contrast.  I recommended continued aspirin therapy.  I have offered for amputation of the middle toe if she has severe pain at this time she is not interested.  She can follow-up with me on an as-needed basis.     Maeola Harman MD [facility]

## 2020-10-01 ENCOUNTER — Emergency Department (HOSPITAL_BASED_OUTPATIENT_CLINIC_OR_DEPARTMENT_OTHER): Payer: Commercial Managed Care - PPO | Admitting: Radiology

## 2020-10-01 ENCOUNTER — Other Ambulatory Visit: Payer: Self-pay

## 2020-10-01 ENCOUNTER — Emergency Department (HOSPITAL_BASED_OUTPATIENT_CLINIC_OR_DEPARTMENT_OTHER)
Admission: EM | Admit: 2020-10-01 | Discharge: 2020-10-01 | Disposition: A | Discharge status: Home / Self Care | Payer: Commercial Managed Care - PPO | Attending: Emergency Medicine | Admitting: Emergency Medicine

## 2020-10-01 ENCOUNTER — Encounter (HOSPITAL_BASED_OUTPATIENT_CLINIC_OR_DEPARTMENT_OTHER): Payer: Self-pay | Admitting: Urology

## 2020-10-01 DIAGNOSIS — M79675 Pain in left toe(s): Secondary | ICD-10-CM | POA: Diagnosis present

## 2020-10-01 DIAGNOSIS — E1152 Type 2 diabetes mellitus with diabetic peripheral angiopathy with gangrene: Secondary | ICD-10-CM | POA: Insufficient documentation

## 2020-10-01 DIAGNOSIS — Z794 Long term (current) use of insulin: Secondary | ICD-10-CM | POA: Diagnosis not present

## 2020-10-01 DIAGNOSIS — Z7984 Long term (current) use of oral hypoglycemic drugs: Secondary | ICD-10-CM | POA: Insufficient documentation

## 2020-10-01 DIAGNOSIS — E1142 Type 2 diabetes mellitus with diabetic polyneuropathy: Secondary | ICD-10-CM | POA: Insufficient documentation

## 2020-10-01 DIAGNOSIS — I96 Gangrene, not elsewhere classified: Secondary | ICD-10-CM

## 2020-10-01 MED ORDER — OXYCODONE-ACETAMINOPHEN 5-325 MG PO TABS
1.0000 | ORAL_TABLET | Freq: Once | ORAL | Status: AC
  Administered 2020-10-01: 1 via ORAL
  Filled 2020-10-01: qty 1

## 2020-10-01 MED ORDER — OXYCODONE HCL 5 MG PO TABS: 5.0000 mg | ORAL_TABLET | Freq: Four times a day (QID) | ORAL | 0 refills | Status: DC | PRN

## 2020-10-01 NOTE — ED Provider Notes (Signed)
[facility] EMERGENCY DEPT Provider Note   CSN: [id number] Arrival date & time: 10/01/20  1701     History Chief Complaint  Patient presents with   Toe Pain    [name] is a 50 y.o. female.  I saw her on August 23, 2020.  I was concerned about a necrotic toe, and she followed up with vascular surgery.  They told her that she could have the toe amputated if it bothered her.  She has been referred to cardiology, and she actually has a podiatry appointment for an unrelated issue tomorrow.  She has continued to have severe pain.  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  She denies any systemic symptoms but does state that the foot itself feels cold.  The history is provided by the patient.  Toe Pain This is a new problem. Episode onset: more than one month ago. The problem occurs constantly. The problem has not changed since onset.Pertinent negatives include no chest pain, no abdominal pain, no headaches and no shortness of breath. Exacerbated by: anything that touches the toe, walking. Nothing relieves the symptoms. She has tried rest for the symptoms. The treatment provided no relief.      Past Medical History:  Diagnosis Date   Acute renal failure (ARF) (HCC)    Allergy    Depression    Diabetes mellitus    Morbid obesity (HCC)     Patient Active Problem List   Diagnosis Date Noted   Type 2 diabetes mellitus with hyperglycemia, with long-term current use of insulin (HCC) 05/27/2019   ATN (acute tubular necrosis) (HCC) 10/31/2015   Cellulitis 10/19/2015   Necrotizing soft tissue infection 10/19/2015   Acute sinus infection 07/07/2013   Gall stones 07/07/2013   Osteoarthritis 03/09/2013   Transaminitis 01/26/2013   Depression 01/26/2013   DM type 2 goal A1C below 7.5 01/26/2013   Diabetes mellitus (HCC) 07/15/2012   Disorder of bone and cartilage 07/15/2012   Morbid obesity (HCC) 07/15/2012   Other malaise  and fatigue 07/15/2012    Past Surgical History:  Procedure Laterality Date   INCISION AND DRAINAGE ABSCESS N/A 10/21/2015   Procedure: dressing change to  PRESACRAL ABCESS;  Surgeon: Romie Levee, MD;  Location: [facility];  Service: General;  Laterality: N/A;  dressing to sacral area   IR GENERIC HISTORICAL  10/27/2015   IR FLUORO GUIDE CV LINE RIGHT 10/27/2015 Gilmer Mor, DO [facility]   IR GENERIC HISTORICAL  10/27/2015   IR US GUIDE VASC ACCESS RIGHT 10/27/2015 Gilmer Mor, DO [facility]   IRRIGATION AND DEBRIDEMENT ABSCESS N/A 10/19/2015   Procedure: IRRIGATION AND DEBRIDEMENT PRESACRAL ABSCESS;  Surgeon: Claud Kelp, MD;  Location: [facility];  Service: General;  Laterality: N/A;   TUBAL LIGATION       OB History   No obstetric history on file.     Family History  Problem Relation Age of Onset   Diabetes Mother    Lymphoma Father    Lymphoma Brother     Social History   Tobacco Use   Smoking status: Never   Smokeless tobacco: Never  Vaping Use   Vaping Use: Never used  Substance Use Topics   Alcohol use: No   Drug use: No    Home Medications Prior to Admission medications   Medication Sig Start Date End Date Taking? Authorizing Provider  oxyCODONE (ROXICODONE) 5 MG immediate release tablet Take  1 tablet (5 mg total) by mouth every 6 (six) hours as needed for up to 8 doses for severe pain. 10/01/20  Yes Koleen Distance, MD  acetaminophen (TYLENOL) 650 MG CR tablet Take 650-1,300 mg by mouth every 8 (eight) hours as needed for pain.    [provider]  albuterol (VENTOLIN HFA) 108 (90 Base) MCG/ACT inhaler Inhale 2 puffs into the lungs every 6 (six) hours as needed for wheezing or shortness of breath.  06/15/15 06/14/16  [provider]  Continuous Blood Gluc Sensor (DEXCOM G6 SENSOR) MISC 1 Device by Does not apply route as directed. 04/06/20   Shamleffer, Konrad Dolores, MD  Continuous Blood Gluc Transmit (DEXCOM G6 TRANSMITTER) MISC 1 Device by Does  not apply route as directed. 04/06/20   Shamleffer, Konrad Dolores, MD  dapagliflozin propanediol (FARXIGA) 5 MG TABS tablet Take 1 tablet (5 mg total) by mouth daily before breakfast. 04/06/20   Shamleffer, Konrad Dolores, MD  gabapentin (NEURONTIN) 100 MG capsule Take 200 mg by mouth 2 (two) times a day.    [provider]  insulin glargine, 2 Unit Dial, (TOUJEO MAX SOLOSTAR) 300 UNIT/ML Solostar Pen Inject 46 Units into the skin daily. 04/06/20   Shamleffer, Konrad Dolores, MD  Insulin Pen Needle 31G X 5 MM MISC 1 Device by Does not apply route as directed. 05/27/19   Shamleffer, Konrad Dolores, MD  metFORMIN (GLUCOPHAGE) 500 MG tablet TAKE 2 TABLETS (1,000 MG TOTAL) BY MOUTH 2 (TWO) TIMES DAILY WITH A MEAL. 08/28/20   Shamleffer, Konrad Dolores, MD  sertraline (ZOLOFT) 25 MG tablet Take 25 mg by mouth daily.    [provider]    Allergies    Codeine, Atorvastatin, Iodinated diagnostic agents, and Metrizamide  Review of Systems   Review of Systems  Constitutional:  Negative for chills and fever.  HENT:  Negative for ear pain and sore throat.   Eyes:  Negative for pain and visual disturbance.  Respiratory:  Negative for cough and shortness of breath.   Cardiovascular:  Negative for chest pain and palpitations.  Gastrointestinal:  Negative for abdominal pain and vomiting.  Genitourinary:  Negative for dysuria and hematuria.  Musculoskeletal:  Negative for arthralgias and back pain.  Skin:  Positive for color change and wound. Negative for rash.  Neurological:  Negative for seizures, syncope and headaches.  All other systems reviewed and are negative.  Physical Exam Updated Vital Signs BP 137/79 (BP Location: Right Arm)   Pulse 79   Temp 98.3 F (36.8 C) (Oral)   Resp 18   Ht 5\' 7"  (1.702 m)   Wt (!) 137.4 kg   SpO2 99%   BMI 47.46 kg/m   Physical Exam Vitals and nursing note reviewed.  HENT:     Head: Normocephalic and atraumatic.  Eyes:     General: No  scleral icterus. Pulmonary:     Effort: Pulmonary effort is normal. No respiratory distress.  Musculoskeletal:     Cervical back: Normal range of motion.     Comments: Left third toe is discolored and bluish.  She has an ulcer beginning on the plantar aspect of the distal phalanx and wrapping around the lateral aspect of the nail fold.  There is some purulent crusting.  The toe itself is cool.  Dorsalis pedis pulse is within normal limits.  Skin:    General: Skin is warm and dry.  Neurological:     General: No focal deficit present.     Mental Status:  She is alert and oriented to person, place, and time.  Psychiatric:        Mood and Affect: Mood normal.    ED Results / Procedures / Treatments   Labs (all labs ordered are listed, but only abnormal results are displayed) Labs Reviewed - No data to display  EKG None  Radiology DG Toe 3rd Left  Result Date: 10/01/2020 CLINICAL DATA:  Osteomyelitis. Left middle toe infection pain for 1 month. EXAM: LEFT THIRD TOE COMPARISON:  None. FINDINGS: No cortical erosion or destruction. There is no evidence of fracture or dislocation. Degenerative changes of the proximal interphalangeal joint. There is no evidence of severe arthropathy or other focal bone abnormality. Subcutaneus soft tissue edema. IMPRESSION: 1. No definite radiographic findings of osteomyelitis. If clinical concern persists, recommend MRI for further evaluation (with intravenous contrast if GFR greater than 30). 2.  No acute displaced fracture or dislocation. Electronically Signed   By: Tish Frederickson M.D.   On: 10/01/2020 20:04    Procedures Procedures   Medications Ordered in ED Medications - No data to display  ED Course  I have reviewed the triage vital signs and the nursing notes.  Pertinent labs & imaging results that were available during my care of the patient were reviewed by me and considered in my medical decision making (see chart for details).    MDM  Rules/Calculators/A&P                           Jimmye C Lamons resents with ongoing symptoms of a gangrenous left toe.  It has progressed since I last saw her.  She was advised to seek further follow-up with vascular surgery to inquire about whether or not she would benefit from immediate amputation.  She has cardiology follow-up for secondary prevention.  I placed her in a postop shoe for comfort so that this toe remains free from pressure.  I also gave her Percocet and counseled her carefully on their use.  She understands the ER will not be an ongoing source of this medication. Final Clinical Impression(s) / ED Diagnoses Final diagnoses:  Gangrene (HCC)  Type 2 diabetes mellitus with diabetic polyneuropathy, with long-term current use of insulin (HCC)    Rx / DC Orders ED Discharge Orders          Ordered    oxyCODONE (ROXICODONE) 5 MG immediate release tablet  Every 6 hours PRN        10/01/20 2110             Koleen Distance, MD 10/01/20 2118

## 2020-10-01 NOTE — ED Triage Notes (Signed)
Left middle toe infection and pain x 1 month. H/o DM

## 2020-10-19 ENCOUNTER — Other Ambulatory Visit: Payer: Self-pay | Admitting: Family Medicine

## 2020-10-19 DIAGNOSIS — Z1231 Encounter for screening mammogram for malignant neoplasm of breast: Secondary | ICD-10-CM

## 2020-11-13 ENCOUNTER — Ambulatory Visit: Payer: Commercial Managed Care - PPO | Admitting: Interventional Cardiology

## 2020-11-13 DIAGNOSIS — I739 Peripheral vascular disease, unspecified: Secondary | ICD-10-CM

## 2020-11-13 DIAGNOSIS — E1121 Type 2 diabetes mellitus with diabetic nephropathy: Secondary | ICD-10-CM

## 2020-11-13 DIAGNOSIS — I75022 Atheroembolism of left lower extremity: Secondary | ICD-10-CM

## 2021-01-28 ENCOUNTER — Other Ambulatory Visit: Payer: Self-pay

## 2021-01-28 ENCOUNTER — Emergency Department
Admission: EM | Admit: 2021-01-28 | Discharge: 2021-01-28 | Disposition: A | Discharge status: Home / Self Care | Payer: Commercial Managed Care - PPO | Attending: Emergency Medicine | Admitting: Emergency Medicine

## 2021-01-28 DIAGNOSIS — Z794 Long term (current) use of insulin: Secondary | ICD-10-CM | POA: Insufficient documentation

## 2021-01-28 DIAGNOSIS — Z7984 Long term (current) use of oral hypoglycemic drugs: Secondary | ICD-10-CM | POA: Diagnosis not present

## 2021-01-28 DIAGNOSIS — L03213 Periorbital cellulitis: Secondary | ICD-10-CM | POA: Diagnosis not present

## 2021-01-28 DIAGNOSIS — E119 Type 2 diabetes mellitus without complications: Secondary | ICD-10-CM | POA: Insufficient documentation

## 2021-01-28 DIAGNOSIS — R220 Localized swelling, mass and lump, head: Secondary | ICD-10-CM | POA: Diagnosis present

## 2021-01-28 MED ORDER — AMOXICILLIN-POT CLAVULANATE 875-125 MG PO TABS: 1.0000 | ORAL_TABLET | Freq: Two times a day (BID) | ORAL | 0 refills | Status: AC

## 2021-01-28 MED ORDER — AMOXICILLIN-POT CLAVULANATE 875-125 MG PO TABS
1.0000 | ORAL_TABLET | Freq: Once | ORAL | Status: AC
  Administered 2021-01-28: 16:00:00 1 via ORAL
  Filled 2021-01-28: qty 1

## 2021-01-28 NOTE — ED Notes (Signed)
Pt to ED for L facial pain and swelling. Pt believes may have sinus infx.

## 2021-01-28 NOTE — ED Notes (Signed)
Drew light green and lavender tubes and sent to lab

## 2021-01-28 NOTE — ED Triage Notes (Signed)
Pt via POV c/o right-sided facial swelling and pain since this morning. She states that she woke up and noticed that her face was swollen and since then her pain has progressively worsened. Currently reports 8/10 pain around right eye socket and right nare. She is a diabetic with allergy to codeine but denies any recent exposure to the drug. No additional complaints or concerns.

## 2021-01-28 NOTE — ED Provider Notes (Signed)
[facility] Emergency Department Provider Note    ____________________________________________   Event Date/Time   First MD Initiated Contact with Patient 01/28/21 1501     (approximate)  I have reviewed the triage vital signs and the nursing notes.   HISTORY  Chief Complaint Facial Swelling    HPI [name] Credit is a 50 y.o. female, history of renal failure, diabetes, and obesity, presents emergency department for evaluation of facial swelling.  Patient states that she woke up with swelling on the right side of her face particular around the sinus, nose, and under the eye.  She states that she may have had an episode of chills in the day, but no documented fever.  She states that recently over the past 3 days she has been using tweezers inside of her nose to remove "sores".  She believes this may be causing her symptoms.  Denies blurred vision, pain with extraocular movements, sore throat, cough, neck pain, chest pain, shortness of breath, abdominal pain, or urinary symptoms.   History limited by: No limitations.  Past Medical History:  Diagnosis Date   Acute renal failure (ARF) ([place])    Allergy    Depression    Diabetes mellitus    Morbid obesity ([place])     Patient Active Problem List   Diagnosis Date Noted   Type 2 diabetes mellitus with hyperglycemia, with long-term current use of insulin ([place]) 05/27/2019   ATN (acute tubular necrosis) ([place]) 10/31/2015   Cellulitis 10/19/2015   Necrotizing soft tissue infection 10/19/2015   Acute sinus infection 07/07/2013   Gall stones 07/07/2013   Osteoarthritis 03/09/2013   Transaminitis 01/26/2013   Depression 01/26/2013   DM type 2 goal A1C below 7.5 01/26/2013   Diabetes mellitus ([place]) 07/15/2012   Disorder of bone and cartilage 07/15/2012   Morbid obesity ([place]) 07/15/2012   Other malaise and fatigue 07/15/2012    Past Surgical History:  Procedure Laterality Date   INCISION AND DRAINAGE ABSCESS  N/A 10/21/2015   Procedure: dressing change to  PRESACRAL ABCESS;  Surgeon: Leighton Ruff, MD;  Location: [facility];  Service: General;  Laterality: N/A;  dressing to sacral area   IR GENERIC HISTORICAL  10/27/2015   IR FLUORO GUIDE CV LINE RIGHT 10/27/2015 Corrie Mckusick, DO [facility]   IR GENERIC HISTORICAL  10/27/2015   IR US GUIDE VASC ACCESS RIGHT 10/27/2015 Corrie Mckusick, DO [facility]   IRRIGATION AND DEBRIDEMENT ABSCESS N/A 10/19/2015   Procedure: IRRIGATION AND DEBRIDEMENT PRESACRAL ABSCESS;  Surgeon: Fanny Skates, MD;  Location: [facility];  Service: General;  Laterality: N/A;   TUBAL LIGATION      Prior to Admission medications   Medication Sig Start Date End Date Taking? Authorizing Provider  amoxicillin-clavulanate (AUGMENTIN) 875-125 MG tablet Take 1 tablet by mouth 2 (two) times daily for 10 days. 01/28/21 02/07/21 Yes Teodoro Spray, PA  acetaminophen (TYLENOL) 650 MG CR tablet Take 650-1,300 mg by mouth every 8 (eight) hours as needed for pain.    [provider]  albuterol (VENTOLIN HFA) 108 (90 Base) MCG/ACT inhaler Inhale 2 puffs into the lungs every 6 (six) hours as needed for wheezing or shortness of breath.  06/15/15 06/14/16  [provider]  Continuous Blood Gluc Sensor (DEXCOM G6 SENSOR) MISC 1 Device by Does not apply route as directed. 04/06/20   Shamleffer, Melanie Crazier, MD  Continuous Blood Gluc Transmit (DEXCOM G6 TRANSMITTER) MISC 1 Device by Does not apply route as directed. 04/06/20  Shamleffer, Melanie Crazier, MD  dapagliflozin propanediol (FARXIGA) 5 MG TABS tablet Take 1 tablet (5 mg total) by mouth daily before breakfast. 04/06/20   Shamleffer, Melanie Crazier, MD  gabapentin (NEURONTIN) 100 MG capsule Take 200 mg by mouth 2 (two) times a day.    [provider]  insulin glargine, 2 Unit Dial, (TOUJEO MAX SOLOSTAR) 300 UNIT/ML Solostar Pen Inject 46 Units into the skin daily. 04/06/20   Shamleffer, Melanie Crazier, MD  Insulin Pen Needle  31G X 5 MM MISC 1 Device by Does not apply route as directed. 05/27/19   Shamleffer, Melanie Crazier, MD  metFORMIN (GLUCOPHAGE) 500 MG tablet TAKE 2 TABLETS (1,000 MG TOTAL) BY MOUTH 2 (TWO) TIMES DAILY WITH A MEAL. 08/28/20   Shamleffer, Melanie Crazier, MD  oxyCODONE (ROXICODONE) 5 MG immediate release tablet Take 1 tablet (5 mg total) by mouth every 6 (six) hours as needed for up to 8 doses for severe pain. 10/01/20   Arnaldo Natal, MD  sertraline (ZOLOFT) 25 MG tablet Take 25 mg by mouth daily.    [provider]    Allergies Codeine, Atorvastatin, Iodinated contrast media, and Metrizamide  Family History  Problem Relation Age of Onset   Diabetes Mother    Lymphoma Father    Lymphoma Brother     Social History Social History   Tobacco Use   Smoking status: Never   Smokeless tobacco: Never  Vaping Use   Vaping Use: Never used  Substance Use Topics   Alcohol use: No   Drug use: No    Review of Systems Review of Systems  Constitutional:  Positive for chills.  HENT:  Positive for sinus pain. Negative for ear pain and sore throat.   Eyes:  Negative for blurred vision, pain, discharge and redness.  Respiratory:  Negative for sputum production and shortness of breath.   Cardiovascular:  Negative for chest pain and leg swelling.  Gastrointestinal:  Negative for abdominal pain and vomiting.  Genitourinary:  Negative for dysuria, flank pain and hematuria.  Musculoskeletal:  Negative for myalgias.  Skin:  Negative for rash.  Neurological:  Negative for weakness and headaches.    10-point ROS otherwise negative. ____________________________________________   PHYSICAL EXAM:  VITAL SIGNS: ED Triage Vitals  Enc Vitals Group     BP 01/28/21 1434 (!) 155/83     Pulse Rate 01/28/21 1434 77     Resp 01/28/21 1434 15     Temp 01/28/21 1434 98.5 F (36.9 C)     Temp Source 01/28/21 1434 Oral     SpO2 01/28/21 1434 99 %     Weight 01/28/21 1435 289 lb (131.1 kg)      Height 01/28/21 1435 5\' 7"  (1.702 m)     Head Circumference --      Peak Flow --      Pain Score 01/28/21 1435 8     Pain Loc --      Pain Edu? --      Excl. in [place]? --     Physical Exam Constitutional:      General: She is not in acute distress.    Appearance: Normal appearance. She is not ill-appearing.  HENT:     Head: Normocephalic.     Right Ear: Tympanic membrane, ear canal and external ear normal.     Left Ear: Tympanic membrane, ear canal and external ear normal.     Nose: Nose normal.     Mouth/Throat:     Mouth:  Mucous membranes are moist.     Pharynx: Oropharynx is clear.  Eyes:     Extraocular Movements: Extraocular movements intact.     Conjunctiva/sclera: Conjunctivae normal.     Pupils: Pupils are equal, round, and reactive to light.     Comments: Erythema present around the periorbital and maxillary sinus region, as well as the right side of the nose.  Tenderness with palpation.  No active bleeding or discharge.  No pain with EOMI.   Cardiovascular:     Rate and Rhythm: Normal rate and regular rhythm.  Pulmonary:     Effort: Pulmonary effort is normal. No respiratory distress.     Breath sounds: Normal breath sounds. No wheezing, rhonchi or rales.  Abdominal:     General: Abdomen is flat. There is no distension.     Palpations: Abdomen is soft.  Musculoskeletal:        General: Normal range of motion.     Cervical back: Normal range of motion and neck supple.  Lymphadenopathy:     Cervical: No cervical adenopathy.  Skin:    General: Skin is warm and dry.  Neurological:     General: No focal deficit present.     Mental Status: She is alert and oriented to person, place, and time. Mental status is at baseline.  Psychiatric:        Mood and Affect: Mood normal.        Behavior: Behavior normal.        Thought Content: Thought content normal.        Judgment: Judgment normal.     ____________________________________________    LABS  (all labs  ordered are listed, but only abnormal results are displayed)  Labs Reviewed - No data to display   ____________________________________________   EKG Not applicable.   ____________________________________________    RADIOLOGY I personally viewed and evaluated these images as part of my medical decision making, as well as reviewing the written report by the radiologist.  ED Provider Interpretation: Not applicable.  No results found.  ____________________________________________   PROCEDURES  Procedures   Medications  amoxicillin-clavulanate (AUGMENTIN) 875-125 MG per tablet 1 tablet (1 tablet Oral Given 01/28/21 1542)    Critical Care performed: No  ____________________________________________   INITIAL IMPRESSION / ASSESSMENT AND PLAN / ED COURSE  Pertinent labs & imaging results that were available during my care of the patient were reviewed by me and considered in my medical decision making (see chart for details).       [name] is a 50 y.o. female, history of renal failure, diabetes, and obesity, presents emergency department for evaluation of facial swelling.  Patient states that she woke up with swelling on the right side of her face, particularly around the sinus, nose, and under the eye.  She states that she may have had an episode of chills in the day, but no documented fever.  She states that recently over the past 3 days she has been using tweezers inside of her nose to remove "sores".  She believes this may be causing her symptoms.  Denies blurred vision, pain with extraocular movements, sore throat, cough, neck pain, chest pain, shortness of breath, abdominal pain, or urinary symptoms.   Upon entering the room, patient appears well.  She is sitting upright in her chair comfortably.  No apparent distress.  Physical exam notable for erythema along the right nare and under the right eye.  Notable tenderness with moderate palpation.  Gross visual acuity  intact.  EOMI intact. No pain with ocular movements.  Patient is afebrile.  Vital signs are within normal limits.  Given the patient's history and physical exam, I suspect that the patient is likely experiencing periorbital cellulitis of the right lower eyelid versus sinusitis.  Given the patient's normal vital signs and lack of pain with extraocular movements, I have a low suspicion for orbital cellulitis.  We will plan to give her a dose of Augmentin here, and then discharge this patient with a 10-day prescription, as well as instructions for follow-up with her established ENT provider.  Patient was given anticipatory guidance and strict return precautions.  Encouraged the patient to return to the emergency department anytime if she begins to experience new or worsening symptoms.        ____________________________________________   FINAL CLINICAL IMPRESSION(S) / ED DIAGNOSES  Final diagnoses:  Preseptal cellulitis of right lower eyelid     NEW MEDICATIONS STARTED DURING THIS VISIT:  ED Discharge Orders          Ordered    amoxicillin-clavulanate (AUGMENTIN) 875-125 MG tablet  2 times daily        01/28/21 1536             Note:  This document was prepared using Dragon voice recognition software and may include unintentional dictation errors.    Varney Daily, [place] 01/28/21 1549    Delton Pra[place], MD 01/28/21 2109

## 2021-01-28 NOTE — Discharge Instructions (Addendum)
-  Call and schedule an appointment with your regular ENT provider as discussed. -Return to the emergency department at any time if you begin to experience new or worsening symptoms. -Take Tylenol and ibuprofen as needed for pain.

## 2021-03-08 ENCOUNTER — Other Ambulatory Visit: Payer: Self-pay

## 2021-03-08 ENCOUNTER — Emergency Department: Payer: Commercial Managed Care - PPO

## 2021-03-08 DIAGNOSIS — M7989 Other specified soft tissue disorders: Secondary | ICD-10-CM | POA: Diagnosis present

## 2021-03-08 DIAGNOSIS — E119 Type 2 diabetes mellitus without complications: Secondary | ICD-10-CM | POA: Diagnosis not present

## 2021-03-08 NOTE — ED Triage Notes (Signed)
Pt states she is having right wrist pain that started two weeks ago. Denies any injury and believes it may be arthritis.

## 2021-03-09 ENCOUNTER — Emergency Department
Admission: EM | Admit: 2021-03-09 | Discharge: 2021-03-09 | Disposition: A | Discharge status: Home / Self Care | Payer: Commercial Managed Care - PPO | Attending: Emergency Medicine | Admitting: Emergency Medicine

## 2021-03-09 ENCOUNTER — Ambulatory Visit: Payer: Commercial Managed Care - PPO | Admitting: Internal Medicine

## 2021-03-09 ENCOUNTER — Encounter: Payer: Self-pay | Admitting: Internal Medicine

## 2021-03-09 DIAGNOSIS — M25431 Effusion, right wrist: Secondary | ICD-10-CM

## 2021-03-09 MED ORDER — DICLOFENAC SODIUM 1 % EX GEL: 2.0000 g | Freq: Four times a day (QID) | CUTANEOUS | 1 refills | Status: DC

## 2021-03-09 NOTE — Progress Notes (Deleted)
Name: [name]  Age/ Sex: 51 y.o., female   MRN/ DOB: [id number], [birth year]     PCP: [name], MD   Reason for Endocrinology Evaluation: Type 2 Diabetes Mellitus  Initial Endocrine Consultative Visit: 07/22/2018    PATIENT IDENTIFIER: [name] is a 51 y.o. female with a past medical history of T2DM, Hx of acute renal failure S/P temporary dialysis in 2017 . The patient has followed with Endocrinology clinic since 07/22/2018 for consultative assistance with management of her diabetes.  DIABETIC HISTORY:  [name] was diagnosed with T2DM in ~ 2000. She was on Ozempic and Novolog but cost was prohibitive, Has been on insulin for years. Her hemoglobin A1c has ranged from 9.8 % in 2014, peaking at 12.9%  in 2011.  Works 3 pm to 11 PM . Wakes up 1 pm    On her initial visit to our clinic her A1c was 10.8%. She was on basal insulin, Metformin and Jardiance  GLP-1 agonists and SGLT-2 inhibitors have been cost prohibitive   SUBJECTIVE:   During the last visit (04/06/2020): A1c 14.0% , we reduced basal insulin,started Ozempic, continued Metformin and Jardiance.   Today (03/09/2021): [name] is here for a follow up on diabetes management.  She has not been to our office in 11 months.   She checks her blood sugars 3-4 times daily, preprandial. The patient has not had hypoglycemic episodes since the last clinic visit.. 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  Denies: CKD, retinopathy  Last eye exam: Completed  2020- hs an appointment 04/11/2020   Macrovascular  complications:    Denies: CAD, PVD, CVA    HISTORY:  Past Medical History:  Past Medical History:  Diagnosis Date   Acute renal failure (ARF) ([place])    Allergy    Depression    Diabetes mellitus    Morbid obesity ([place])    Past Surgical History:  Past Surgical History:  Procedure Laterality Date   INCISION AND DRAINAGE ABSCESS N/A 10/21/2015   Procedure: dressing change to  PRESACRAL ABCESS;  Surgeon: Leighton Ruff, MD;  Location: [facility];  Service: General;  Laterality: N/A;  dressing to sacral area   IR GENERIC HISTORICAL  10/27/2015   IR FLUORO GUIDE CV LINE RIGHT 10/27/2015 Corrie Mckusick, DO [facility]   IR GENERIC HISTORICAL  10/27/2015   IR US GUIDE VASC ACCESS RIGHT 10/27/2015 Corrie Mckusick, DO [facility]   IRRIGATION AND DEBRIDEMENT ABSCESS N/A 10/19/2015   Procedure: IRRIGATION AND DEBRIDEMENT PRESACRAL ABSCESS;  Surgeon: Fanny Skates, MD;  Location: [facility];  Service: General;  Laterality: N/A;   TUBAL LIGATION     Social History:  reports that she has never smoked. She has never used smokeless tobacco. She reports that she does not drink alcohol and does not use drugs. Family History:  Family History  Problem Relation Age of Onset   Diabetes Mother    Lymphoma Father    Lymphoma Brother      HOME MEDICATIONS: Allergies as of 03/09/2021  Reactions   Codeine Shortness Of Breath   Atorvastatin Other (See Comments)   DIFFUSE MYALGIA, RESOLVED AFTER STOPPING RX; SHE TRIED IT AGAIN BUT DEVELOPED DIFFUSE MYALGIA AND REFUSES A DIFFERENT STATIN TRIAL DIFFUSE MYALGIA, RESOLVED AFTER STOPPING RX; SHE TRIED IT AGAIN BUT DEVELOPED DIFFUSE MYALGIA AND REFUSES A DIFFERENT STATIN TRIAL   Iodinated Contrast Media Other (See Comments)   Kidney shutdown Other reaction(s): Other (See Comments) ACUTE TUBULAR NECROSIS AFTER IV CONTRAST ADMINISTRATION FOR CT ACUTE TUBULAR NECROSIS AFTER IV CONTRAST ADMINISTRATION FOR CT   Metrizamide Other (See Comments)   Kidney  shutdown Kidney shutdown        Medication List        Accurate as of March 09, 2021  7:22 AM. If you have any questions, ask your nurse or doctor.          acetaminophen 650 MG CR tablet Commonly known as: TYLENOL Take 650-1,300 mg by mouth every 8 (eight) hours as needed for pain.   dapagliflozin propanediol 5 MG Tabs tablet Commonly known as: Farxiga Take 1 tablet (5 mg total) by mouth daily before breakfast.   Dexcom G6 Sensor Misc 1 Device by Does not apply route as directed.   Dexcom G6 Transmitter Misc 1 Device by Does not apply route as directed.   diclofenac Sodium 1 % Gel Commonly known as: Voltaren Apply 2 g topically 4 (four) times daily.   gabapentin 100 MG capsule Commonly known as: NEURONTIN Take 200 mg by mouth 2 (two) times a day.   Insulin Pen Needle 31G X 5 MM Misc 1 Device by Does not apply route as directed.   metFORMIN 500 MG tablet Commonly known as: GLUCOPHAGE TAKE 2 TABLETS (1,000 MG TOTAL) BY MOUTH 2 (TWO) TIMES DAILY WITH A MEAL.   oxyCODONE 5 MG immediate release tablet Commonly known as: Roxicodone Take 1 tablet (5 mg total) by mouth every 6 (six) hours as needed for up to 8 doses for severe pain.   sertraline 25 MG tablet Commonly known as: ZOLOFT Take 25 mg by mouth daily.   Toujeo Max SoloStar 300 UNIT/ML Solostar Pen Generic drug: insulin glargine (2 Unit Dial) Inject 46 Units into the skin daily.   Ventolin HFA 108 (90 Base) MCG/ACT inhaler Generic drug: albuterol Inhale 2 puffs into the lungs every 6 (six) hours as needed for wheezing or shortness of breath.         OBJECTIVE:   Vital Signs: There were no vitals taken for this visit.  Wt Readings from Last 3 Encounters:  03/08/21 298 lb (135.2 kg)  01/28/21 289 lb (131.1 kg)  10/01/20 (!) 303 lb (137.4 kg)     Exam: General: Pt appears well and is in NAD  Lungs: Clear with good BS bilat with no rales, rhonchi, or wheezes  Heart: RRR with normal S1 and  S2 and no gallops; no murmurs; no rub  Extremities: No pretibial edema.   Neuro: MS is good with appropriate affect, pt is alert and Ox3       DM foot exam: 05/27/2019   The skin of the feet is intact without sores or ulcerations. The pedal pulses are 2+ on right and 2+ on left. The sensation is absent at the great toes to a screening 5.07, 10 gram monofilament.       DATA REVIEWED:  Lab Results  Component Value Date   HGBA1C 14.0 (A) 04/06/2020   HGBA1C 10.8 (A) 07/22/2018   HGBA1C 11.7 (H) 10/19/2015  Lab Results  Component Value Date   MICROALBUR 4.8 (H) 04/06/2020   LDLCALC 87 04/06/2020   CREATININE 0.80 08/23/2020   Lab Results  Component Value Date   MICRALBCREAT 8.1 04/06/2020     Lab Results  Component Value Date   CHOL 173 04/06/2020   HDL 67.60 04/06/2020   LDLCALC 87 04/06/2020   TRIG 95.0 04/06/2020   CHOLHDL 3 04/06/2020         ASSESSMENT / PLAN / RECOMMENDATIONS:   1) Type 2 Diabetes Mellitus, Poorly controlled, With neuropathic complications - Most recent A1c 14.0% ,  Goal A1c < 7.0 %.  - Pt with sporadic office visits, we discussed annual visit is not sufficient to control or manage her diabetes  - I suspect medication non-adherence as well as dietary indiscretions  - She has new insurance, she would like to try SGLT-2 inhibitors , we discussed risk of genital infections.   - Encouraged compliance and low carb diet and to avoid snacks    MEDICATIONS:  - Continue Metformin 500 mg 2 tablets twice a day - Increase Tresiba to 45 units daily  - Humalog 16 units BEFORE  each meal ( preferably 20-30 minutes before a meal) - Start Farxiga 5 mg , 1 tablet daily in the morning     EDUCATION / INSTRUCTIONS: BG monitoring instructions: Patient is instructed to check her blood sugars 2 times a day, before breakfast and Supper. Call [facility] if: BG persistently < 70 I reviewed the Rule of 15 for the treatment of hypoglycemia in  detail with the patient. Literature supplied.     F/U in 3 months    Signed electronically by: Mack Guise, MD  [facility]  [facility] [address] [place], [place] [address] Phone: [phone] FAX: [phone]   CC: [name], [place] [place] [address] Phone: [phone]  Fax: [phone]  Return to Endocrinology clinic as below: Future Appointments  Date Time Provider [place]  03/09/2021  8:50 AM Kihanna Kamiya, Melanie Crazier, MD [id number] None

## 2021-03-09 NOTE — ED Notes (Signed)
Pt eval & d/c by provider in triage 

## 2021-03-09 NOTE — ED Provider Notes (Signed)
[facility] Provider Note    Event Date/Time   First MD Initiated Contact with Patient 03/09/21 0036     (approximate)   History   Wrist Pain   HPI  [name] is a 51 y.o. female with a history of diabetes and obesity who presents for evaluation of right-sided wrist pain.  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  Reports that the pain started 3 weeks ago.  She has noticed swelling of the dorsal aspect of the right wrist which has been constant.  The pain is worse with movement of the wrist.  She denies any known trauma, no redness, no fever.  Symptoms have been stable.  She has been taking over-the-counter ibuprofen and Tylenol, arthritis creams, and even a tramadol with no significant improvement of the pain.  No history of gout.     Past Medical History:  Diagnosis Date   Acute renal failure (ARF) ([facility])    Allergy    Depression    Diabetes mellitus    Morbid obesity ([facility])     Past Surgical History:  Procedure Laterality Date   INCISION AND DRAINAGE ABSCESS N/A 10/21/2015   Procedure: dressing change to  PRESACRAL ABCESS;  Surgeon: Romie Levee, MD;  [facility];  Service: General;  Laterality: N/A;  dressing to sacral area   IR GENERIC HISTORICAL  10/27/2015   IR FLUORO GUIDE CV LINE RIGHT 10/27/2015 Gilmer Mor, DO [facility]   IR GENERIC HISTORICAL  10/27/2015   IR US GUIDE VASC ACCESS RIGHT 10/27/2015 Gilmer Mor, DO [facility]   IRRIGATION AND DEBRIDEMENT ABSCESS N/A 10/19/2015   Procedure: IRRIGATION AND DEBRIDEMENT PRESACRAL ABSCESS;  Surgeon: Claud Kelp, MD;  [facility];  Service: General;  Laterality: N/A;   TUBAL LIGATION       Physical Exam   Triage Vital Signs: ED Triage Vitals [03/08/21 2347]  Enc Vitals Group     BP (!) 161/85     Pulse Rate 73     Resp 20     Temp 98.4 F (36.9 C)     Temp src      SpO2 100 %     Weight 298 lb (135.2 kg)     Height 5\' 7"  (1.702 m)     Head  Circumference      Peak Flow      Pain Score 10     Pain Loc      Pain Edu?      Excl. in GC?     Most recent vital signs: Vitals:   03/08/21 2347  BP: (!) 161/85  Pulse: 73  Resp: 20  Temp: 98.4 F (36.9 C)  SpO2: 100%     Constitutional: Alert and oriented. Well appearing and in no apparent distress. HEENT:      Head: Normocephalic and atraumatic.         Eyes: Conjunctivae are normal. Sclera is non-icteric.       Mouth/Throat: Mucous membranes are moist.       Neck: Supple with no signs of meningismus. Cardiovascular: Regular rate and rhythm.  Respiratory: Normal respiratory effort.  Musculoskeletal:  There is swelling of the dorsal aspect of the R wrist mostly on the ulnar side, no warmth or erythema, reduced ROM due to pain Neurologic: Normal speech and language. Face is symmetric. Moving all extremities. No gross focal neurologic deficits are appreciated. Skin: Skin is warm, dry and intact. No rash  noted. Psychiatric: Mood and affect are normal. Speech and behavior are normal.  ED Results / Procedures / Treatments   Labs (all labs ordered are listed, but only abnormal results are displayed) Labs Reviewed - No data to display   EKG  none   RADIOLOGY I, Nita Sickle, attending MD, have personally viewed and interpreted the images obtained during this visit as below:  X-ray of the wrist with no signs of fracture or dislocation   ___________________________________________________ Interpretation by Radiologist:  DG Wrist Complete Right  Result Date: 03/09/2021 CLINICAL DATA:  Wrist pain for 2 weeks, no known injury, initial encounter EXAM: RIGHT WRIST - COMPLETE 3+ VIEW COMPARISON:  None. FINDINGS: There is no evidence of fracture or dislocation. There is no evidence of arthropathy or other focal bone abnormality. Soft tissues are unremarkable. IMPRESSION: No acute abnormality noted. Electronically Signed   By: Alcide Clever M.D.   On: 03/09/2021 00:05       PROCEDURES:  Critical Care performed: No  Procedures    IMPRESSION / MDM / ASSESSMENT AND PLAN / ED COURSE  I reviewed the triage vital signs and the nursing notes.  51 y.o. female with a history of diabetes and obesity who presents for evaluation of right-sided wrist pain.  Patient has some swelling in the dorsal aspect of the wrist mostly on the ulnar aspect with no warmth or erythema.  Ddx: Contusion versus fracture versus dislocation versus arthritis versus gout.  Doubt septic arthritis with no fever, no erythema, no warmth, no signs of sepsis in spite of 3 weeks of symptoms   Plan: X-ray   MEDICATIONS GIVEN IN ED: Medications - No data to display   ED COURSE: X-ray with no signs of fracture dislocation or air.  Patient be referred to orthopedics for further evaluation.  We will give a Velcro wrist splint for comfort.  Discussed rice therapy.  Voltaren gel topically 4 times a day.  Continue Tylenol 1000 mg 3 times daily.   Consults: None   EMR reviewed none    FINAL CLINICAL IMPRESSION(S) / ED DIAGNOSES   Final diagnoses:  Swelling of right wrist     Rx / DC Orders   ED Discharge Orders          Ordered    AMB referral to orthopedics       Comments: Right wrist swelling and pain   03/09/21 0044    diclofenac Sodium (VOLTAREN) 1 % GEL  4 times daily        03/09/21 0046             Note:  This document was prepared using Dragon voice recognition software and may include unintentional dictation errors.   Please note:  Patient was evaluated in Emergency Department today for the symptoms described in the history of present illness. Patient was evaluated in the context of the global COVID-19 pandemic, which necessitated consideration that the patient might be at risk for infection with the SARS-CoV-2 virus that causes COVID-19. Institutional protocols and algorithms that pertain to the evaluation of patients at risk for COVID-19 are in a state of  rapid change based on information released by regulatory bodies including the CDC and federal and state organizations. These policies and algorithms were followed during the patient's care in the ED.  Some ED evaluations and interventions may be delayed as a result of limited staffing during the pandemic.       Don Perking, [place], MD 03/09/21 [phone]

## 2021-04-10 ENCOUNTER — Ambulatory Visit: Payer: Commercial Managed Care - PPO

## 2021-04-10 ENCOUNTER — Ambulatory Visit
Admission: RE | Admit: 2021-04-10 | Discharge: 2021-04-10 | Disposition: A | Discharge status: Home / Self Care | Payer: Commercial Managed Care - PPO | Source: Ambulatory Visit | Attending: Family Medicine | Admitting: Family Medicine

## 2021-04-10 ENCOUNTER — Other Ambulatory Visit: Payer: Self-pay

## 2021-04-10 DIAGNOSIS — Z1231 Encounter for screening mammogram for malignant neoplasm of breast: Secondary | ICD-10-CM

## 2021-04-17 ENCOUNTER — Encounter: Payer: Self-pay | Admitting: Internal Medicine

## 2021-04-17 ENCOUNTER — Other Ambulatory Visit: Payer: Self-pay

## 2021-04-17 ENCOUNTER — Ambulatory Visit (INDEPENDENT_AMBULATORY_CARE_PROVIDER_SITE_OTHER): Payer: Commercial Managed Care - PPO | Admitting: Internal Medicine

## 2021-04-17 VITALS — BP 126/80 | HR 84 | Ht 67.0 in | Wt 286.4 lb

## 2021-04-17 DIAGNOSIS — E1165 Type 2 diabetes mellitus with hyperglycemia: Secondary | ICD-10-CM

## 2021-04-17 DIAGNOSIS — E038 Other specified hypothyroidism: Secondary | ICD-10-CM | POA: Diagnosis not present

## 2021-04-17 DIAGNOSIS — E1142 Type 2 diabetes mellitus with diabetic polyneuropathy: Secondary | ICD-10-CM

## 2021-04-17 DIAGNOSIS — Z794 Long term (current) use of insulin: Secondary | ICD-10-CM | POA: Diagnosis not present

## 2021-04-17 LAB — POCT GLYCOSYLATED HEMOGLOBIN (HGB A1C): HbA1c POC (<> result, manual entry): >15 % (ref 4.0–5.6)

## 2021-04-17 LAB — BASIC METABOLIC PANEL
BUN: 17 mg/dL (ref 6–23)
CO2: 28 mEq/L (ref 19–32)
Calcium: 9.9 mg/dL (ref 8.4–10.5)
Chloride: 93 mEq/L — ABNORMAL LOW (ref 96–112)
Creatinine, Ser: 0.89 mg/dL (ref 0.40–1.20)
GFR: 74.56 mL/min (ref >60.00)
Glucose, Bld: 496 mg/dL — ABNORMAL HIGH (ref 70–99)
Potassium: 4.1 mEq/L (ref 3.5–5.1)
Sodium: 129 mEq/L — ABNORMAL LOW (ref 135–145)

## 2021-04-17 LAB — TSH: TSH: 6.95 u[IU]/mL — ABNORMAL HIGH (ref 0.35–5.50)

## 2021-04-17 LAB — POCT GLUCOSE (DEVICE FOR HOME USE): Glucose Fasting, POC: 483 mg/dL — AB (ref 70–99)

## 2021-04-17 LAB — LIPID PANEL
Cholesterol: 175 mg/dL (ref 0–200)
HDL: 70.1 mg/dL (ref >39.00)
LDL Cholesterol: 87 mg/dL (ref 0–99)
NonHDL: 105.37
Total CHOL/HDL Ratio: 3
Triglycerides: 92 mg/dL (ref 0.0–149.0)
VLDL: 18.4 mg/dL (ref 0.0–40.0)

## 2021-04-17 LAB — T4, FREE: Free T4: 0.92 ng/dL (ref 0.60–1.60)

## 2021-04-17 MED ORDER — FREESTYLE LIBRE 2 SENSOR MISC: 1.0000 | 2 refills | Status: AC

## 2021-04-17 MED ORDER — METFORMIN HCL 500 MG PO TABS: 1000.0000 mg | ORAL_TABLET | Freq: Two times a day (BID) | ORAL | 1 refills | Status: AC

## 2021-04-17 MED ORDER — BD PEN NEEDLE MINI U/F 31G X 5 MM MISC: 1.0000 | Freq: Four times a day (QID) | 2 refills | Status: AC

## 2021-04-17 MED ORDER — DAPAGLIFLOZIN PROPANEDIOL 5 MG PO TABS: 5.0000 mg | ORAL_TABLET | Freq: Every day | ORAL | 1 refills | Status: DC

## 2021-04-17 MED ORDER — TOUJEO MAX SOLOSTAR 300 UNIT/ML ~~LOC~~ SOPN
45.0000 [IU] | PEN_INJECTOR | Freq: Every day | SUBCUTANEOUS | 1 refills | Status: DC
Start: 2021-04-17 — End: 2021-05-09

## 2021-04-17 MED ORDER — INSULIN LISPRO (1 UNIT DIAL) 100 UNIT/ML (KWIKPEN): PEN_INJECTOR | SUBCUTANEOUS | 6 refills | Status: AC

## 2021-04-17 NOTE — Patient Instructions (Signed)
?-   Continue Metformin 500 mg 2 tablets twice a day ?- Start  Toujeo 45 units daily  ?- Humalog 12 units BEFORE  each meal ?- Start Farxiga 5 mg , 1 tablet daily in the morning  ?-Humalog correctional insulin: ADD extra units on insulin to your meal-time Humalog dose if your blood sugars are higher than 160. Use the scale below to help guide you:  ? ?Blood sugar before meal Number of units to inject  ?Less than 160 0 unit  ?161 -  190 1 units  ?191 -  220 2 units  ?221 -  250 3 units  ?251 -  280 4 units  ?281 -  310 5 units  ?311 -  340 6 units  ?341 -  370 7 units  ?371 -  400 8 units  ? ? ? ? ? ?HOW TO TREAT LOW BLOOD SUGARS (Blood sugar LESS THAN 70 MG/DL) ?Please follow the RULE OF 15 for the treatment of hypoglycemia treatment (when your (blood sugars are less than 70 mg/dL)  ? ?STEP 1: Take 15 grams of carbohydrates when your blood sugar is low, which includes:  ?3-4 GLUCOSE TABS  OR ?3-4 OZ OF JUICE OR REGULAR SODA OR ?ONE TUBE OF GLUCOSE GEL   ? ?STEP 2: RECHECK blood sugar in 15 MINUTES ?STEP 3: If your blood sugar is still low at the 15 minute recheck --> then, go back to STEP 1 and treat AGAIN with another 15 grams of carbohydrates. ? ?

## 2021-04-17 NOTE — Progress Notes (Signed)
? ?Name: [name]  ?Age/ Sex: 51 y.o., female   ?MRN/ DOB: [id number], July 05, 1970    ? ?PCP: Verlon Au, MD   ?Reason for Endocrinology Evaluation: Type 2 Diabetes Mellitus  ?Initial Endocrine Consultative Visit: 07/22/2018  ? ? ?PATIENT IDENTIFIER: [name] is a 51 y.o. female with a past medical history of T2DM, Hx of acute renal failure S/P temporary dialysis in 2017 . The patient has followed with Endocrinology clinic since 07/22/2018 for consultative assistance with management of her diabetes. ? ?DIABETIC HISTORY:  ?[name] was diagnosed with T2DM in ~ 2000. She was on Ozempic and Novolog but cost was prohibitive, Has been on insulin for years. Her hemoglobin A1c has ranged from 9.8 % in 2014, peaking at 12.9%  in 2011. ? ?Works 3 pm to 11 PM . Wakes up 1 pm  ? ? ?On her initial visit to our clinic her A1c was 10.8%. She was on basal insulin, Metformin and Jardiance ? ?GLP-1 agonists and SGLT-2 inhibitors have been cost prohibitive  ? ?SUBJECTIVE:  ? ?During the last visit (04/06/2020): A1c 14 % , She has not been to our clinic in 11 months. we adjusted insulin and started farxiga and continued metformin  ? ?Today (04/17/2021): [name] is here for a follow up on diabetes management.  She has not been to our office in 12 months.   She checks her blood sugars 0 times daily. The patient has not had hypoglycemic episodes since the last clinic visit.. She did not bring her meter today  ?  ?Since her last visit here she had gangrene of the foot , S/P left lesser toe amputation  ? ?She had sweet tea this morning but did not eat yet.  ? ?She has been taking Metformin and Humalog but not the farxiga and toujeo  ?Dexcom was cost prohibitive  ? ?Denies nausea, vomiting or diarrhea  ? ? ? ?HOME DIABETES REGIMEN:  ?Toujeo 46 units daily - not taking  ?Humalog 16 units with each meal - takes 11 units with each meal  ?Metformin 500 mg 2  tablets BID  ?Farxiga 5 mg daily - not  taking  ? ? ? ?METER DOWNLOAD SUMMARY: Did not bring  ? ? ? 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  ?Macrovascular complications:  ?  ?Denies: CAD, PVD, CVA ? ? ? ?HISTORY:  ?Past Medical History:  ?Past Medical History:  ?Diagnosis Date  ? Acute renal failure (ARF) ([facility])   ? Allergy   ? Depression   ? Diabetes mellitus   ? Morbid obesity ([facility])   ? ?Past Surgical History:  ?Past Surgical History:  ?Procedure Laterality Date  ? INCISION AND DRAINAGE ABSCESS N/A 10/21/2015  ? Procedure: dressing change to  PRESACRAL ABCESS;  Surgeon: Romie Levee, MD;  [facility]: [facility];  Service: General;  Laterality: N/A;  dressing to sacral area  ? IR GENERIC HISTORICAL  10/27/2015  ? IR FLUORO GUIDE CV LINE RIGHT 10/27/2015 Gilmer Mor, DO [facility]  ? IR GENERIC HISTORICAL  10/27/2015  ? IR US GUIDE VASC ACCESS RIGHT 10/27/2015 Gilmer Mor, DO [facility]  ? IRRIGATION AND DEBRIDEMENT ABSCESS N/A 10/19/2015  ? Procedure: IRRIGATION AND DEBRIDEMENT PRESACRAL ABSCESS;  Surgeon: Claud Kelp, MD;  Location: [facility];  Service: General;  Laterality: N/A;  ? TUBAL LIGATION    ? ?Social History:  reports  that she has never smoked. She has never used smokeless tobacco. She reports that she does not drink alcohol and does not use drugs. ?Family History:  ?Family History  ?Problem Relation Age of Onset  ? Diabetes Mother   ? Lymphoma Father   ? Lymphoma Brother   ? ? ? ?HOME MEDICATIONS: ?Allergies as of 04/17/2021   ? ?   Reactions  ? Codeine Shortness Of Breath  ? Atorvastatin Other (See Comments)  ? DIFFUSE MYALGIA, RESOLVED AFTER STOPPING RX; SHE TRIED IT AGAIN BUT DEVELOPED DIFFUSE MYALGIA AND REFUSES A DIFFERENT STATIN TRIAL ?DIFFUSE MYALGIA, RESOLVED AFTER STOPPING RX; SHE TRIED IT AGAIN BUT DEVELOPED DIFFUSE MYALGIA AND REFUSES A DIFFERENT STATIN TRIAL  ? Iodinated Contrast Media Other (See  Comments)  ? Kidney shutdown ?Other reaction(s): Other (See Comments) ?ACUTE TUBULAR NECROSIS AFTER IV CONTRAST ADMINISTRATION FOR CT ?ACUTE TUBULAR NECROSIS AFTER IV CONTRAST ADMINISTRATION FOR CT  ? Metrizamide Other (See Comments)  ? Kidney shutdown ?Kidney shutdown  ? ?  ? ?  ?Medication List  ?  ? ?  ? Accurate as of April 17, 2021  9:33 AM. If you have any questions, ask your nurse or doctor.  ?  ?  ? ?  ? ?acetaminophen 650 MG CR tablet ?Commonly known as: TYLENOL ?Take 650-1,300 mg by mouth every 8 (eight) hours as needed for pain. ?  ?B-D UF III MINI PEN NEEDLES 31G X 5 MM Misc ?Generic drug: Insulin Pen Needle ?1 Device by Does not apply route in the morning, at noon, in the evening, and at bedtime. ?What changed: when to take this ?Changed by: Scarlette ShortsIbtehal J Chau Savell, MD ?  ?dapagliflozin propanediol 5 MG Tabs tablet ?Commonly known as: [place]xiga ?Take 1 tablet (5 mg total) by mouth daily before breakfast. ?  ?Dexcom G6 Transmitter Misc ?1 Device by Does not apply route as directed. ?  ?diclofenac Sodium 1 % Gel ?Commonly known as: Voltaren ?Apply 2 g topically 4 (four) times daily. ?  ?FreeStyle Libre 2 Sensor Misc ?1 Device by Does not apply route every 14 (fourteen) days. ?What changed: when to take this ?Changed by: Scarlette ShortsIbtehal J Floreen Teegarden, MD ?  ?gabapentin 100 MG capsule ?Commonly known as: NEURONTIN ?Take 200 mg by mouth 2 (two) times a day. ?  ?gabapentin 300 MG capsule ?Commonly known as: NEURONTIN ?Take 300 mg by mouth 2 (two) times daily. ?  ?insulin lispro 100 UNIT/ML KwikPen ?Commonly known as: HumaLOG KwikPen ?Max daily 70 units ?Started by: Scarlette ShortsIbtehal J Amarissa Koerner, MD ?  ?metFORMIN 500 MG tablet ?Commonly known as: GLUCOPHAGE ?Take 2 tablets (1,000 mg total) by mouth 2 (two) times daily with a meal. ?  ?oxyCODONE 5 MG immediate release tablet ?Commonly known as: Roxicodone ?Take 1 tablet (5 mg total) by mouth every 6 (six) hours as needed for up to 8 doses for severe pain. ?  ?sertraline 25 MG  tablet ?Commonly known as: ZOLOFT ?Take 25 mg by mouth daily. ?  ?Toujeo Max SoloStar 300 UNIT/ML Solostar Pen ?Generic drug: insulin glargine (2 Unit Dial) ?Inject 45 Units into the skin daily. ?What changed: how much to take ?Changed by: Scarlette ShortsIbtehal J Hortencia Martire, MD ?  ?Ventolin HFA 108 (90 Base) MCG/ACT inhaler ?Generic drug: albuterol ?Inhale 2 puffs into the lungs every 6 (six) hours as needed for wheezing or shortness of breath. ?  ? ?  ? ? ? ?OBJECTIVE:  ? ?Vital Signs: BP 126/80 (BP Location: Left Arm, Patient Position: Sitting, Cuff Size: Large)   Pulse  84   Ht 5\' 7"  (1.702 m)   Wt 286 lb 6.4 oz (129.9 kg)   SpO2 96%   BMI 44.86 kg/m?   ?Wt Readings from Last 3 Encounters:  ?04/17/21 286 lb 6.4 oz (129.9 kg)  ?03/08/21 298 lb (135.2 kg)  ?01/28/21 289 lb (131.1 kg)  ? ? ? ?Exam: ?General: Pt appears well and is in NAD  ?Lungs: Clear with good BS bilat with no rales, rhonchi, or wheezes  ?Heart: RRR with normal S1 and S2 and no gallops; no murmurs; no rub  ?Extremities: No pretibial edema.   ?Neuro: MS is good with appropriate affect, pt is alert and Ox3  ? ? ? ?  DM foot exam: 04/17/2021 ?  ?The skin of the feet is without sores or ulcerations, S/P left 3rd toe amputation ?The pedal pulses are 2+ on right and 2+ on left. ?The sensation is decreased  to a screening 5.07, 10 gram monofilament. ?  ?   ? ?DATA REVIEWED: ? ?Lab Results  ?Component Value Date  ? HGBA1C >15.0 04/17/2021  ? HGBA1C 14.0 (A) 04/06/2020  ? HGBA1C 10.8 (A) 07/22/2018  ? ? Latest Reference Range & Units 04/17/21 09:51  ?BASIC METABOLIC PANEL  Rpt !  ?Sodium 135 - 145 mEq/L 129 (L)  ?Potassium 3.5 - 5.1 mEq/L 4.1  ?Chloride 96 - 112 mEq/L 93 (L)  ?CO2 19 - 32 mEq/L 28  ?Glucose 70 - 99 mg/dL 04/19/21 (H)  ?BUN 6 - 23 mg/dL 17  ?Creatinine 0.40 - 1.20 mg/dL 709  ?Calcium 8.4 - 10.5 mg/dL 9.9  ?GFR >60.00 mL/min 74.56  ? ? Latest Reference Range & Units 04/17/21 09:51  ?Total CHOL/HDL Ratio  3  ?Cholesterol 0 - 200 mg/dL 04/19/21  ?HDL Cholesterol  >39.00 mg/dL 366  ?LDL (calc) 0 - 99 mg/dL 87  ?NonHDL  105.37  ?Triglycerides 0.0 - 149.0 mg/dL 29.47  ?VLDL 0.0 - 40.0 mg/dL 65.4  ? ? Latest Reference Range & Units 04/17/21 09:51  ?TSH 0.35 - 5.50 uIU/mL 6.95 (H)

## 2021-04-18 DIAGNOSIS — E038 Other specified hypothyroidism: Secondary | ICD-10-CM | POA: Insufficient documentation

## 2021-04-19 ENCOUNTER — Telehealth: Payer: Self-pay

## 2021-04-19 NOTE — Telephone Encounter (Addendum)
Patient states that the Toujeo was $300. She can't afford that and would like something cheaper. ?

## 2021-04-20 ENCOUNTER — Other Ambulatory Visit: Payer: Self-pay | Admitting: Internal Medicine

## 2021-04-20 MED ORDER — INSULIN GLARGINE-YFGN 100 UNIT/ML ~~LOC~~ SOLN: 45.0000 [IU] | Freq: Every day | SUBCUTANEOUS | 6 refills | Status: DC

## 2021-04-20 NOTE — Telephone Encounter (Signed)
Patient notified and will contact pharmacy to get medication  ?

## 2021-04-24 ENCOUNTER — Telehealth: Payer: Self-pay | Admitting: Pharmacy Technician

## 2021-04-24 ENCOUNTER — Other Ambulatory Visit (HOSPITAL_COMMUNITY): Payer: Self-pay

## 2021-04-24 NOTE — Telephone Encounter (Signed)
Patient Advocate Encounter ? ?Received notification from [facility] that prior authorization for [facility] is required. ?  ?PA submitted on 3.21.23 ?PA EOC ID [id number] ?Status is pending ?  ?[facility] will continue to follow ? ?Garhett Bernhard R Shanta Dorvil, CPhT ?Patient Advocate ?[facility] ?Phone: [phone] ?Fax:  [phone] ? ?

## 2021-04-26 ENCOUNTER — Other Ambulatory Visit (HOSPITAL_COMMUNITY): Payer: Self-pay

## 2021-04-26 NOTE — Telephone Encounter (Signed)
CORRECTION: ?PA was submitted for 10mg . Pt is taking 5mg  [place]. It still will process under the same PA.  ?

## 2021-04-26 NOTE — Telephone Encounter (Signed)
Patient Advocate Encounter ? ?Prior Authorization for Farxiga 10mg  tabs has been approved.   ? ?PA# ? ?Effective dates: 04/26/21 through 04/26/22 ? ?Per Test Claim Patients co-pay is $38.46.  ? ?Spoke with Pharmacy to Process. ? ?Patient Advocate ?Fax: [phone]  ?

## 2021-05-08 ENCOUNTER — Telehealth: Payer: Self-pay

## 2021-05-08 NOTE — Telephone Encounter (Signed)
Left vm for patient to callback regarding medication questions  ?

## 2021-05-09 ENCOUNTER — Other Ambulatory Visit: Payer: Self-pay | Admitting: Internal Medicine

## 2021-05-09 ENCOUNTER — Telehealth: Payer: Self-pay

## 2021-05-09 MED ORDER — LANTUS SOLOSTAR 100 UNIT/ML ~~LOC~~ SOPN: 45.0000 [IU] | PEN_INJECTOR | Freq: Every day | SUBCUTANEOUS | 3 refills | Status: DC

## 2021-05-09 NOTE — Telephone Encounter (Signed)
Patient states that the insurance will cover the Lantus. 86month supply for $123.00 ?

## 2021-05-16 ENCOUNTER — Other Ambulatory Visit: Payer: Self-pay

## 2021-05-16 MED ORDER — INSULIN GLARGINE 100 UNIT/ML ~~LOC~~ SOLN: SUBCUTANEOUS | 3 refills | Status: DC

## 2021-05-16 NOTE — Telephone Encounter (Signed)
Patient requested vials because they are cheaper  ?

## 2021-05-18 ENCOUNTER — Telehealth: Payer: Self-pay | Admitting: Internal Medicine

## 2021-05-18 NOTE — Telephone Encounter (Signed)
Patient dropped off FMLA application for Dr. Lonzo Cloud to complete and requests once completed be faxed to the fax# on the application. FMLA application has been placed in Dr. Harvel Ricks mail box at front desk. ?

## 2021-05-21 ENCOUNTER — Telehealth: Payer: Self-pay

## 2021-05-21 NOTE — Telephone Encounter (Signed)
Patient states that her job in requesting to have this fill out for her diabetes. Patient has had to be out of work because of high sugars.  ?

## 2021-05-23 NOTE — Telephone Encounter (Signed)
Vm left to let patient know paperwork has be completed and ready for pick up. There is a $20 dollar charge  ?

## 2021-05-24 NOTE — Telephone Encounter (Signed)
Patient advised and would like paperwork faxed to number on paperwork.  ?

## 2021-07-23 ENCOUNTER — Encounter: Payer: Self-pay | Admitting: Internal Medicine

## 2021-07-23 ENCOUNTER — Ambulatory Visit: Payer: Commercial Managed Care - PPO | Admitting: Internal Medicine

## 2021-07-26 ENCOUNTER — Telehealth: Payer: Self-pay | Admitting: Internal Medicine

## 2021-07-26 NOTE — Telephone Encounter (Signed)
Patient dismissed from [facility] by Lesly Dukes, MD, effective 07/23/21. Dismissal Letter sent out by 1st class mail. [employer]

## 2021-08-30 ENCOUNTER — Other Ambulatory Visit: Payer: Self-pay

## 2021-08-30 ENCOUNTER — Encounter (HOSPITAL_BASED_OUTPATIENT_CLINIC_OR_DEPARTMENT_OTHER): Payer: Self-pay

## 2021-08-30 ENCOUNTER — Emergency Department (HOSPITAL_BASED_OUTPATIENT_CLINIC_OR_DEPARTMENT_OTHER)
Admission: EM | Admit: 2021-08-30 | Discharge: 2021-08-31 | Disposition: A | Discharge status: Home / Self Care | Payer: Commercial Managed Care - PPO | Attending: Emergency Medicine | Admitting: Emergency Medicine

## 2021-08-30 DIAGNOSIS — E119 Type 2 diabetes mellitus without complications: Secondary | ICD-10-CM | POA: Diagnosis not present

## 2021-08-30 DIAGNOSIS — Z20822 Contact with and (suspected) exposure to covid-19: Secondary | ICD-10-CM | POA: Diagnosis not present

## 2021-08-30 DIAGNOSIS — Z7984 Long term (current) use of oral hypoglycemic drugs: Secondary | ICD-10-CM | POA: Insufficient documentation

## 2021-08-30 DIAGNOSIS — Z794 Long term (current) use of insulin: Secondary | ICD-10-CM | POA: Diagnosis not present

## 2021-08-30 DIAGNOSIS — J020 Streptococcal pharyngitis: Secondary | ICD-10-CM | POA: Diagnosis not present

## 2021-08-30 DIAGNOSIS — R509 Fever, unspecified: Secondary | ICD-10-CM | POA: Diagnosis present

## 2021-08-30 LAB — RESP PANEL BY RT-PCR (FLU A&B, COVID) ARPGX2
Influenza A by PCR: NEGATIVE
Influenza B by PCR: NEGATIVE
SARS Coronavirus 2 by RT PCR: NEGATIVE

## 2021-08-30 LAB — GROUP A STREP BY PCR: Group A Strep by PCR: DETECTED — AB

## 2021-08-30 NOTE — ED Triage Notes (Signed)
Patient here POV from Home with Family.  Endorses Multiple Symptoms since this AM including Headache, Congestion, Generalized Body Aches, and Fever.  102 Temperature earlier Treated with Tylenol.   No Discernable Cough. Mild Nausea. No Emesis. No Diarrhea.   NAD Noted during Triage. A&Ox4. GCS 15. BIB Wheelchair.

## 2021-08-31 MED ORDER — ACETAMINOPHEN 500 MG PO TABS
1000.0000 mg | ORAL_TABLET | Freq: Once | ORAL | Status: AC
Start: 2021-08-31 — End: 2021-08-31
  Administered 2021-08-31: 1000 mg via ORAL
  Filled 2021-08-31: qty 2

## 2021-08-31 MED ORDER — PENICILLIN G BENZATHINE 1200000 UNIT/2ML IM SUSY
2.4000 10*6.[IU] | PREFILLED_SYRINGE | Freq: Once | INTRAMUSCULAR | Status: AC
  Administered 2021-08-31: 2.4 10*6.[IU] via INTRAMUSCULAR
  Filled 2021-08-31: qty 4

## 2021-08-31 NOTE — ED Provider Notes (Signed)
[facility] EMERGENCY DEPT  Provider Note  CSN: [id number] Arrival date & time: 08/30/21 2128  History Chief Complaint  Patient presents with   Fever    [name] is a 51 y.o. female with history of DM reports onset of fever, body aches and nasal congestion earlier today. She has not had a cough, sore throat, N/V/D or dysuria. She previously had a foot ulcer which has healed, no other new rashes.    Home Medications Prior to Admission medications   Medication Sig Start Date End Date Taking? Authorizing Provider  acetaminophen (TYLENOL) 650 MG CR tablet Take 650-1,300 mg by mouth every 8 (eight) hours as needed for pain.    [provider]  albuterol (VENTOLIN HFA) 108 (90 Base) MCG/ACT inhaler Inhale 2 puffs into the lungs every 6 (six) hours as needed for wheezing or shortness of breath.  06/15/15 06/14/16  [provider]  Continuous Blood Gluc Sensor (FREESTYLE LIBRE 2 SENSOR) MISC 1 Device by Does not apply route every 14 (fourteen) days. 04/17/21   Shamleffer, Konrad Dolores, MD  Continuous Blood Gluc Transmit (DEXCOM G6 TRANSMITTER) MISC 1 Device by Does not apply route as directed. Patient not taking: Reported on 04/17/2021 04/06/20   Shamleffer, Konrad Dolores, MD  dapagliflozin propanediol (FARXIGA) 5 MG TABS tablet Take 1 tablet (5 mg total) by mouth daily before breakfast. 04/17/21   Shamleffer, Konrad Dolores, MD  diclofenac Sodium (VOLTAREN) 1 % GEL Apply 2 g topically 4 (four) times daily. 03/09/21   Nita Sickle, MD  gabapentin (NEURONTIN) 100 MG capsule Take 200 mg by mouth 2 (two) times a day.    [provider]  gabapentin (NEURONTIN) 300 MG capsule Take 300 mg by mouth 2 (two) times daily. 01/21/21   [provider]  insulin glargine (LANTUS) 100 UNIT/ML injection Inject 45 Units into the skin daily. 05/16/21   Shamleffer, Konrad Dolores, MD  insulin glargine-yfgn (SEMGLEE, YFGN,) 100 UNIT/ML injection Inject  0.45 mLs (45 Units total) into the skin daily. 04/20/21   Shamleffer, Konrad Dolores, MD  insulin lispro (HUMALOG KWIKPEN) 100 UNIT/ML KwikPen Max daily 70 units 04/17/21   Shamleffer, Konrad Dolores, MD  Insulin Pen Needle (B-D UF III MINI PEN NEEDLES) 31G X 5 MM MISC 1 Device by Does not apply route in the morning, at noon, in the evening, and at bedtime. 04/17/21   Shamleffer, Konrad Dolores, MD  metFORMIN (GLUCOPHAGE) 500 MG tablet Take 2 tablets (1,000 mg total) by mouth 2 (two) times daily with a meal. 04/17/21   Shamleffer, Konrad Dolores, MD  oxyCODONE (ROXICODONE) 5 MG immediate release tablet Take 1 tablet (5 mg total) by mouth every 6 (six) hours as needed for up to 8 doses for severe pain. 10/01/20   Koleen Distance, MD  sertraline (ZOLOFT) 25 MG tablet Take 25 mg by mouth daily.    [provider]     Allergies    Codeine, Atorvastatin, Iodinated contrast media, and Metrizamide   Review of Systems   Review of Systems Please see HPI for pertinent positives and negatives  Physical Exam BP (!) 147/73   Pulse 92   Temp 99.6 F (37.6 C)   Resp 18   Ht 5\' 7"  (1.702 m)   Wt 129.9 kg   SpO2 98%   BMI 44.85 kg/m   Physical Exam Vitals and nursing note reviewed.  Constitutional:      Appearance: Normal appearance.  HENT:     Head: Normocephalic and atraumatic.  Nose: Congestion present.     Mouth/Throat:     Mouth: Mucous membranes are moist.     Pharynx: Posterior oropharyngeal erythema present.  Eyes:     Extraocular Movements: Extraocular movements intact.     Conjunctiva/sclera: Conjunctivae normal.  Cardiovascular:     Rate and Rhythm: Normal rate.  Pulmonary:     Effort: Pulmonary effort is normal.     Breath sounds: Normal breath sounds. No wheezing or rales.  Abdominal:     General: Abdomen is flat.     Palpations: Abdomen is soft.     Tenderness: There is no abdominal tenderness.  Musculoskeletal:        General: No swelling. Normal range of  motion.     Cervical back: Neck supple.  Skin:    General: Skin is warm and dry.     Findings: No rash.  Neurological:     General: No focal deficit present.     Mental Status: She is alert.  Psychiatric:        Mood and Affect: Mood normal.     ED Results / Procedures / Treatments   EKG None  Procedures Procedures  Medications Ordered in the ED Medications  penicillin g benzathine (BICILLIN LA) 1200000 UNIT/2ML injection 2.4 Million Units (has no administration in time range)  acetaminophen (TYLENOL) tablet 1,000 mg (has no administration in time range)    Initial Impression and Plan  Patient here with fever and mild URI symptoms. Otherwise looks well with normal vitals and a benign exam. Labs done in triage showed neg Covid but positive strep. Will treat with IM PCN, continued symptomatic care at home and PCP follow up.   ED Course       MDM Rules/Calculators/A&P Medical Decision Making Problems Addressed: Strep pharyngitis: acute illness or injury  Amount and/or Complexity of Data Reviewed Labs: ordered. Decision-making details documented in ED Course.  Risk OTC drugs. Prescription drug management.    Final Clinical Impression(s) / ED Diagnoses Final diagnoses:  Strep pharyngitis    Rx / DC Orders ED Discharge Orders     None        Pollyann Savoy, MD 08/31/21 [phone]

## 2021-09-29 ENCOUNTER — Other Ambulatory Visit: Payer: Self-pay | Admitting: Internal Medicine

## 2021-12-18 ENCOUNTER — Other Ambulatory Visit: Payer: Self-pay

## 2021-12-18 ENCOUNTER — Emergency Department (HOSPITAL_BASED_OUTPATIENT_CLINIC_OR_DEPARTMENT_OTHER): Payer: Commercial Managed Care - PPO | Admitting: Radiology

## 2021-12-18 ENCOUNTER — Encounter (HOSPITAL_BASED_OUTPATIENT_CLINIC_OR_DEPARTMENT_OTHER): Payer: Self-pay

## 2021-12-18 ENCOUNTER — Emergency Department (HOSPITAL_BASED_OUTPATIENT_CLINIC_OR_DEPARTMENT_OTHER)
Admission: EM | Admit: 2021-12-18 | Discharge: 2021-12-18 | Disposition: A | Discharge status: Home / Self Care | Payer: Commercial Managed Care - PPO | Attending: Emergency Medicine | Admitting: Emergency Medicine

## 2021-12-18 DIAGNOSIS — Z1152 Encounter for screening for COVID-19: Secondary | ICD-10-CM | POA: Diagnosis not present

## 2021-12-18 DIAGNOSIS — Z794 Long term (current) use of insulin: Secondary | ICD-10-CM | POA: Diagnosis not present

## 2021-12-18 DIAGNOSIS — J069 Acute upper respiratory infection, unspecified: Secondary | ICD-10-CM | POA: Diagnosis not present

## 2021-12-18 DIAGNOSIS — Z7984 Long term (current) use of oral hypoglycemic drugs: Secondary | ICD-10-CM | POA: Insufficient documentation

## 2021-12-18 DIAGNOSIS — J029 Acute pharyngitis, unspecified: Secondary | ICD-10-CM | POA: Diagnosis present

## 2021-12-18 LAB — RESP PANEL BY RT-PCR (FLU A&B, COVID) ARPGX2
Influenza A by PCR: NEGATIVE
Influenza B by PCR: NEGATIVE
SARS Coronavirus 2 by RT PCR: NEGATIVE

## 2021-12-18 MED ORDER — ALBUTEROL SULFATE HFA 108 (90 BASE) MCG/ACT IN AERS: 2.0000 | INHALATION_SPRAY | RESPIRATORY_TRACT | Status: DC | PRN

## 2021-12-18 NOTE — ED Provider Notes (Signed)
[place] EMERGENCY DEPT  Provider Note  CSN: [id number] Arrival date & time: 12/18/21 0014  History Chief Complaint  Patient presents with   Sore Throat    [name] is a 51 y.o. female reports three days of cough, nasal congestion, post-nasal drip and sore throat. She has had a hoarse voice. No reported fevers. She sometimes feels SOB.    Home Medications Prior to Admission medications   Medication Sig Start Date End Date Taking? Authorizing Provider  acetaminophen (TYLENOL) 650 MG CR tablet Take 650-1,300 mg by mouth every 8 (eight) hours as needed for pain.    [provider]  albuterol (VENTOLIN HFA) 108 (90 Base) MCG/ACT inhaler Inhale 2 puffs into the lungs every 6 (six) hours as needed for wheezing or shortness of breath.  06/15/15 06/14/16  [provider]  Continuous Blood Gluc Sensor (FREESTYLE LIBRE 2 SENSOR) MISC 1 Device by Does not apply route every 14 (fourteen) days. 04/17/21   Shamleffer, Melanie Crazier, MD  Continuous Blood Gluc Transmit (DEXCOM G6 TRANSMITTER) MISC 1 Device by Does not apply route as directed. Patient not taking: Reported on 04/17/2021 04/06/20   Shamleffer, Melanie Crazier, MD  dapagliflozin propanediol (FARXIGA) 5 MG TABS tablet Take 1 tablet (5 mg total) by mouth daily before breakfast. 04/17/21   Shamleffer, Melanie Crazier, MD  diclofenac Sodium (VOLTAREN) 1 % GEL Apply 2 g topically 4 (four) times daily. 03/09/21   Rudene Re, MD  gabapentin (NEURONTIN) 100 MG capsule Take 200 mg by mouth 2 (two) times a day.    [provider]  gabapentin (NEURONTIN) 300 MG capsule Take 300 mg by mouth 2 (two) times daily. 01/21/21   [provider]  insulin glargine (LANTUS) 100 UNIT/ML injection Inject 45 Units into the skin daily. 05/16/21   Shamleffer, Melanie Crazier, MD  insulin glargine-yfgn (SEMGLEE, YFGN,) 100 UNIT/ML injection Inject 0.45 mLs (45 Units total) into the skin daily. 04/20/21    Shamleffer, Melanie Crazier, MD  insulin lispro (HUMALOG KWIKPEN) 100 UNIT/ML KwikPen Max daily 70 units 04/17/21   Shamleffer, Melanie Crazier, MD  Insulin Pen Needle (B-D UF III MINI PEN NEEDLES) 31G X 5 MM MISC 1 Device by Does not apply route in the morning, at noon, in the evening, and at bedtime. 04/17/21   Shamleffer, Melanie Crazier, MD  metFORMIN (GLUCOPHAGE) 500 MG tablet Take 2 tablets (1,000 mg total) by mouth 2 (two) times daily with a meal. 04/17/21   Shamleffer, Melanie Crazier, MD  oxyCODONE (ROXICODONE) 5 MG immediate release tablet Take 1 tablet (5 mg total) by mouth every 6 (six) hours as needed for up to 8 doses for severe pain. 10/01/20   Arnaldo Natal, MD  sertraline (ZOLOFT) 25 MG tablet Take 25 mg by mouth daily.    [provider]     Allergies    Codeine, Atorvastatin, Iodinated contrast media, and Metrizamide   Review of Systems   Review of Systems Please see HPI for pertinent positives and negatives  Physical Exam BP (!) 154/71   Pulse 70   Temp 98 F (36.7 C) (Oral)   Resp 18   Ht 5\' 7"  (1.702 m)   Wt (!) 142 kg   SpO2 99%   BMI 49.02 kg/m   Physical Exam Vitals and nursing note reviewed.  Constitutional:      Appearance: Normal appearance.  HENT:     Head: Normocephalic and atraumatic.     Nose: Nose normal.  Mouth/Throat:     Mouth: Mucous membranes are moist.     Comments: Post-nasal drip, mucous in posterior pharynx, no exudates Eyes:     Extraocular Movements: Extraocular movements intact.     Conjunctiva/sclera: Conjunctivae normal.  Cardiovascular:     Rate and Rhythm: Normal rate.  Pulmonary:     Effort: Pulmonary effort is normal.     Breath sounds: Normal breath sounds.  Abdominal:     General: Abdomen is flat.     Palpations: Abdomen is soft.     Tenderness: There is no abdominal tenderness.  Musculoskeletal:        General: No swelling. Normal range of motion.     Cervical back: Neck supple.  Skin:    General:  Skin is warm and dry.     Findings: No rash (on exposed skin).  Neurological:     General: No focal deficit present.     Mental Status: She is alert and oriented to person, place, and time.  Psychiatric:        Mood and Affect: Mood normal.     ED Results / Procedures / Treatments   EKG EKG Interpretation  Date/Time:  Tuesday December 18 2021 00:37:08 EST Ventricular Rate:  75 PR Interval:  170 QRS Duration: 94 QT Interval:  390 QTC Calculation: 435 R Axis:   2 Text Interpretation: Normal sinus rhythm Septal infarct , age undetermined Abnormal ECG When compared with ECG of 19-Oct-2015 10:43, PREVIOUS ECG IS PRESENT Since last tracing Non specific T wave changes Confirmed by Susy Frizzle [phone]) on 12/18/2021 12:50:03 AM  Procedures Procedures  Medications Ordered in the ED Medications  albuterol (VENTOLIN HFA) 108 (90 Base) MCG/ACT inhaler 2 puff (has no administration in time range)    Initial Impression and Plan  Patient well appearing with URI symptoms x 3 days. Has not had any OTC decongestants, but has been using throat lozenges and a humidifier. I personally viewed the images from radiology studies and agree with radiologist interpretation: CXR is clear. Will check Covid/flu swab.   ED Course   Clinical Course as of 12/18/21 0605  Tue Dec 18, 2021  0603 Covid is neg. Patient advised to continue with OTC symptomatic meds including decongestants to help with her post-nasal drip. PCP follow up.RTED for any other concerns.  [CS]    Clinical Course User Index [CS] Pollyann Savoy, MD     MDM Rules/Calculators/A&P Medical Decision Making Problems Addressed: Viral URI with cough: acute illness or injury  Amount and/or Complexity of Data Reviewed Labs: ordered. Decision-making details documented in ED Course. Radiology: ordered and independent interpretation performed. Decision-making details documented in ED Course. ECG/medicine tests: ordered and  independent interpretation performed. Decision-making details documented in ED Course.  Risk Prescription drug management.    Final Clinical Impression(s) / ED Diagnoses Final diagnoses:  Viral URI with cough    Rx / DC Orders ED Discharge Orders     None        Pollyann Savoy, MD 12/18/21 [phone]

## 2021-12-18 NOTE — ED Triage Notes (Signed)
Pt reports SHOB, "lots of mucous", and cough for a few days.

## 2021-12-19 ENCOUNTER — Other Ambulatory Visit: Payer: Self-pay | Admitting: Internal Medicine

## 2022-01-31 ENCOUNTER — Other Ambulatory Visit: Payer: Self-pay | Admitting: Internal Medicine

## 2022-03-10 ENCOUNTER — Other Ambulatory Visit: Payer: Self-pay

## 2022-03-10 DIAGNOSIS — Z7984 Long term (current) use of oral hypoglycemic drugs: Secondary | ICD-10-CM | POA: Insufficient documentation

## 2022-03-10 DIAGNOSIS — I10 Essential (primary) hypertension: Secondary | ICD-10-CM | POA: Diagnosis not present

## 2022-03-10 DIAGNOSIS — L03011 Cellulitis of right finger: Secondary | ICD-10-CM | POA: Insufficient documentation

## 2022-03-10 DIAGNOSIS — Z79899 Other long term (current) drug therapy: Secondary | ICD-10-CM | POA: Insufficient documentation

## 2022-03-10 DIAGNOSIS — Z794 Long term (current) use of insulin: Secondary | ICD-10-CM | POA: Insufficient documentation

## 2022-03-10 DIAGNOSIS — E1165 Type 2 diabetes mellitus with hyperglycemia: Secondary | ICD-10-CM | POA: Diagnosis not present

## 2022-03-10 DIAGNOSIS — M79644 Pain in right finger(s): Secondary | ICD-10-CM | POA: Diagnosis present

## 2022-03-10 NOTE — ED Triage Notes (Signed)
Reports infection of right thumb at base of nail. Unsure of source-denies trauma. Started 2 weeks ago. Nail is blanchable and sensation still intact. Has had discharge from wound and discoloration to the area. HX diabetes.

## 2022-03-11 ENCOUNTER — Emergency Department (HOSPITAL_BASED_OUTPATIENT_CLINIC_OR_DEPARTMENT_OTHER)
Admission: EM | Admit: 2022-03-11 | Discharge: 2022-03-11 | Disposition: A | Discharge status: Home / Self Care | Payer: Commercial Managed Care - PPO | Attending: Emergency Medicine | Admitting: Emergency Medicine

## 2022-03-11 DIAGNOSIS — I10 Essential (primary) hypertension: Secondary | ICD-10-CM

## 2022-03-11 DIAGNOSIS — R739 Hyperglycemia, unspecified: Secondary | ICD-10-CM

## 2022-03-11 DIAGNOSIS — L03011 Cellulitis of right finger: Secondary | ICD-10-CM

## 2022-03-11 LAB — CBG MONITORING, ED: Glucose-Capillary: 275 mg/dL — ABNORMAL HIGH (ref 70–99)

## 2022-03-11 MED ORDER — SULFAMETHOXAZOLE-TRIMETHOPRIM 800-160 MG PO TABS
1.0000 | ORAL_TABLET | Freq: Once | ORAL | Status: AC
  Administered 2022-03-11: 1 via ORAL
  Filled 2022-03-11: qty 1

## 2022-03-11 MED ORDER — SULFAMETHOXAZOLE-TRIMETHOPRIM 800-160 MG PO TABS: 1.0000 | ORAL_TABLET | Freq: Two times a day (BID) | ORAL | 0 refills | Status: DC

## 2022-03-11 MED ORDER — SULFAMETHOXAZOLE-TRIMETHOPRIM 800-160 MG PO TABS: 1.0000 | ORAL_TABLET | Freq: Two times a day (BID) | ORAL | 0 refills | Status: AC

## 2022-03-11 NOTE — Discharge Instructions (Signed)
You are seen in the emergency department for your thumb pain.  You likely had a small abscess around your fingernail called a paronychia though appears to have started to drain on its own.  You do some skin discoloration around the site concerning for skin infection and we have started you on antibiotics.  You should complete these as prescribed.  You can follow-up with your primary doctor in the next few days to have your wound rechecked.  Your blood pressure and blood sugar were also elevated in the emergency department and you should have these rechecked by your primary doctor as well to see if you need any medication changes.  You should return to the emergency department if you are having streaking redness up your arm, you have swelling of your entire thumb, you are unable to bend or move your thumb or if you have any other new or concerning symptoms.

## 2022-03-11 NOTE — ED Provider Notes (Signed)
[facility] Provider Note   CSN: [id number] Arrival date & time: 03/10/22  2048     History  Chief Complaint  Patient presents with   Wound Infection    [name] is a 52 y.o. female.  Patient is a 52 year old female with a past medical history of diabetes presenting to the emergency department with finger pain.  The patient states that she initially noticed some swelling along the nail of her right thumb about 2 weeks ago.  She states that it started to drain pus however since then she has continued to have pain along the nail.  She states that she has been cleaning the wound daily and applying a topical antibiotic.  She denies any fevers, numbness or weakness and reports normal range of motion of her thumb.  She states that she is right-handed.  The history is provided by the patient.       Home Medications Prior to Admission medications   Medication Sig Start Date End Date Taking? Authorizing Provider  sulfamethoxazole-trimethoprim (BACTRIM DS) 800-160 MG tablet Take 1 tablet by mouth 2 (two) times daily for 7 days. 03/11/22 03/18/22 Yes Leanord Asal K, DO  acetaminophen (TYLENOL) 650 MG CR tablet Take 650-1,300 mg by mouth every 8 (eight) hours as needed for pain.    [provider]  albuterol (VENTOLIN HFA) 108 (90 Base) MCG/ACT inhaler Inhale 2 puffs into the lungs every 6 (six) hours as needed for wheezing or shortness of breath.  06/15/15 06/14/16  [provider]  Continuous Blood Gluc Sensor (FREESTYLE LIBRE 2 SENSOR) MISC 1 Device by Does not apply route every 14 (fourteen) days. 04/17/21   Shamleffer, Melanie Crazier, MD  Continuous Blood Gluc Transmit (DEXCOM G6 TRANSMITTER) MISC 1 Device by Does not apply route as directed. Patient not taking: Reported on 04/17/2021 04/06/20   Shamleffer, Melanie Crazier, MD  dapagliflozin propanediol (FARXIGA) 5 MG TABS tablet Take 1 tablet (5 mg total) by mouth daily  before breakfast. 04/17/21   Shamleffer, Melanie Crazier, MD  diclofenac Sodium (VOLTAREN) 1 % GEL Apply 2 g topically 4 (four) times daily. 03/09/21   Rudene Re, MD  gabapentin (NEURONTIN) 100 MG capsule Take 200 mg by mouth 2 (two) times a day.    [provider]  gabapentin (NEURONTIN) 300 MG capsule Take 300 mg by mouth 2 (two) times daily. 01/21/21   [provider]  insulin glargine (LANTUS) 100 UNIT/ML injection Inject 45 Units into the skin daily. 05/16/21   Shamleffer, Melanie Crazier, MD  insulin glargine-yfgn (SEMGLEE, YFGN,) 100 UNIT/ML injection Inject 0.45 mLs (45 Units total) into the skin daily. 04/20/21   Shamleffer, Melanie Crazier, MD  insulin lispro (HUMALOG KWIKPEN) 100 UNIT/ML KwikPen Max daily 70 units 04/17/21   Shamleffer, Melanie Crazier, MD  Insulin Pen Needle (B-D UF III MINI PEN NEEDLES) 31G X 5 MM MISC 1 Device by Does not apply route in the morning, at noon, in the evening, and at bedtime. 04/17/21   Shamleffer, Melanie Crazier, MD  metFORMIN (GLUCOPHAGE) 500 MG tablet Take 2 tablets (1,000 mg total) by mouth 2 (two) times daily with a meal. 04/17/21   Shamleffer, Melanie Crazier, MD  oxyCODONE (ROXICODONE) 5 MG immediate release tablet Take 1 tablet (5 mg total) by mouth every 6 (six) hours as needed for up to 8 doses for severe pain. 10/01/20   Arnaldo Natal, MD  sertraline (ZOLOFT) 25 MG tablet Take 25 mg by mouth daily.  [provider]      Allergies    Codeine, Atorvastatin, Iodinated contrast media, and Metrizamide    Review of Systems   Review of Systems  Physical Exam Updated Vital Signs BP (!) 182/90   Pulse 69   Temp 98.2 F (36.8 C) (Oral)   Resp 18   Ht 5\' 7"  (1.702 m)   Wt (!) 141.5 kg   SpO2 99%   BMI 48.87 kg/m  Physical Exam Vitals and nursing note reviewed.  Constitutional:      General: She is not in acute distress.    Appearance: Normal appearance. She is obese.  HENT:     Head: Normocephalic and  atraumatic.     Nose: Nose normal.     Mouth/Throat:     Mouth: Mucous membranes are moist.     Pharynx: Oropharynx is clear.  Eyes:     Extraocular Movements: Extraocular movements intact.  Cardiovascular:     Rate and Rhythm: Normal rate.     Pulses: Normal pulses.  Pulmonary:     Effort: Pulmonary effort is normal.  Musculoskeletal:        General: Normal range of motion.     Cervical back: Normal range of motion and neck supple.     Comments: Tenderness to palpation along inferior aspect of nail of right thumb, full flexion/extension of DIP/MCP  Skin:    General: Skin is warm and dry.     Capillary Refill: Capillary refill takes less than 2 seconds.     Comments: small amount of purulence below nail of right thumb, no active drainage, mild surrounding erythema  Neurological:     General: No focal deficit present.     Mental Status: She is alert and oriented to person, place, and time.     Sensory: No sensory deficit.     Motor: No weakness.  Psychiatric:        Mood and Affect: Mood normal.        Behavior: Behavior normal.     ED Results / Procedures / Treatments   Labs (all labs ordered are listed, but only abnormal results are displayed) Labs Reviewed  CBG MONITORING, ED - Abnormal; Notable for the following components:      Result Value   Glucose-Capillary 275 (*)    All other components within normal limits    EKG None  Radiology No results found.  Procedures Procedures    Medications Ordered in ED Medications  sulfamethoxazole-trimethoprim (BACTRIM DS) 800-160 MG per tablet 1 tablet (has no administration in time range)    ED Course/ Medical Decision Making/ A&P                             Medical Decision Making This patient presents to the ED with chief complaint(s) of thumb wound with pertinent past medical history of diabetes which further complicates the presenting complaint. The complaint involves an extensive differential diagnosis and  also carries with it a high risk of complications and morbidity.    The differential diagnosis includes paronychia, no evidence of felon, cellulitis, no evidence of flexor tenosynovitis, no bony tenderness making fracture dislocation unlikely, no signs of sepsis on exam  Additional history obtained: Additional history obtained from N/A Records reviewed Care Everywhere/External Records  ED Course and Reassessment: Patient does have tenderness and erythema along the right nail, however there is no active purulent drainage with small amount of purulence under the nail.  Patient likely had a paronychia that has drained on its own and does not require I&D today.  Due to the prolonged purulence and surrounding erythema in the setting of diabetes she will be started on antibiotic for treatment of cellulitis secondary to the paronychia.  She was mildly hyperglycemic here though she does report eating candy prior to her blood sugar check and was recommended close primary care follow-up for repeat blood sugar and blood pressure check as well as wound recheck.  She was given strict return precautions.  Independent labs interpretation:  The following labs were independently interpreted: Mild hyperglycemia  Independent visualization of imaging: N/A  Consultation: - Consulted or discussed management/test interpretation w/ external professional: N/A  Consideration for admission or further workup: Patient has no emergent conditions requiring admission or further work-up at this time and is stable for discharge home with primary care follow-up  Social Determinants of health: N/A    Risk Prescription drug management.          Final Clinical Impression(s) / ED Diagnoses Final diagnoses:  Paronychia of finger, right  Cellulitis of finger of right hand  Hypertension, unspecified type  Hyperglycemia    Rx / DC Orders ED Discharge Orders          Ordered    sulfamethoxazole-trimethoprim  (BACTRIM DS) 800-160 MG tablet  2 times daily        03/11/22 0430              Kemper Durie, DO 03/11/22 [phone]

## 2022-08-31 ENCOUNTER — Observation Stay (HOSPITAL_COMMUNITY)
Admission: EM | Admit: 2022-08-31 | Discharge: 2022-09-03 | Disposition: A | Discharge status: Home / Self Care | Payer: PRIVATE HEALTH INSURANCE | Attending: Internal Medicine | Admitting: Internal Medicine

## 2022-08-31 ENCOUNTER — Other Ambulatory Visit: Payer: Self-pay

## 2022-08-31 ENCOUNTER — Emergency Department (HOSPITAL_COMMUNITY): Payer: PRIVATE HEALTH INSURANCE

## 2022-08-31 ENCOUNTER — Encounter (HOSPITAL_COMMUNITY): Payer: Self-pay

## 2022-08-31 DIAGNOSIS — Z794 Long term (current) use of insulin: Secondary | ICD-10-CM | POA: Diagnosis not present

## 2022-08-31 DIAGNOSIS — E119 Type 2 diabetes mellitus without complications: Secondary | ICD-10-CM | POA: Insufficient documentation

## 2022-08-31 DIAGNOSIS — K219 Gastro-esophageal reflux disease without esophagitis: Secondary | ICD-10-CM

## 2022-08-31 DIAGNOSIS — D649 Anemia, unspecified: Principal | ICD-10-CM

## 2022-08-31 DIAGNOSIS — Z79899 Other long term (current) drug therapy: Secondary | ICD-10-CM | POA: Diagnosis not present

## 2022-08-31 DIAGNOSIS — D500 Iron deficiency anemia secondary to blood loss (chronic): Secondary | ICD-10-CM | POA: Diagnosis not present

## 2022-08-31 DIAGNOSIS — R5383 Other fatigue: Secondary | ICD-10-CM | POA: Diagnosis present

## 2022-08-31 DIAGNOSIS — D62 Acute posthemorrhagic anemia: Secondary | ICD-10-CM | POA: Diagnosis present

## 2022-08-31 LAB — CBC
HCT: 23.6 % — ABNORMAL LOW (ref 36.0–46.0)
Hemoglobin: 6.8 g/dL — CL (ref 12.0–15.0)
MCH: 20.2 pg — ABNORMAL LOW (ref 26.0–34.0)
MCHC: 28.8 g/dL — ABNORMAL LOW (ref 30.0–36.0)
MCV: 70.2 fL — ABNORMAL LOW (ref 80.0–100.0)
Platelets: 264 10*3/uL (ref 150–400)
RBC: 3.36 MIL/uL — ABNORMAL LOW (ref 3.87–5.11)
RDW: 15.9 % — ABNORMAL HIGH (ref 11.5–15.5)
WBC: 6.9 10*3/uL (ref 4.0–10.5)
nRBC: 0 % (ref 0.0–0.2)

## 2022-08-31 LAB — BASIC METABOLIC PANEL
Anion gap: 11 (ref 5–15)
BUN: 17 mg/dL (ref 6–20)
CO2: 22 mmol/L (ref 22–32)
Calcium: 8.9 mg/dL (ref 8.9–10.3)
Chloride: 94 mmol/L — ABNORMAL LOW (ref 98–111)
Creatinine, Ser: 0.96 mg/dL (ref 0.44–1.00)
GFR, Estimated: >60 mL/min (ref >60)
Glucose, Bld: 407 mg/dL — ABNORMAL HIGH (ref 70–99)
Potassium: 3.4 mmol/L — ABNORMAL LOW (ref 3.5–5.1)
Sodium: 127 mmol/L — ABNORMAL LOW (ref 135–145)

## 2022-08-31 LAB — HCG, SERUM, QUALITATIVE: Preg, Serum: NEGATIVE

## 2022-08-31 LAB — CBG MONITORING, ED: Glucose-Capillary: 423 mg/dL — ABNORMAL HIGH (ref 70–99)

## 2022-08-31 NOTE — ED Triage Notes (Signed)
Pt. Arrives POV for generalized weakness x1 week. She states that she feels that her iron is low because she has been eating multiple cups of ice. She also states that she no longer gets a period but she has fibroids which causes her to bleed.

## 2022-08-31 NOTE — ED Provider Notes (Signed)
[place] EMERGENCY DEPARTMENT AT [facility] Provider Note   CSN: [id number] Arrival date & time: 08/31/22  2105     History {Add pertinent medical, surgical, social history, OB history to HPI:1} Chief Complaint  Patient presents with   Weakness    [name] is a 52 y.o. female.  52 year old female with a history of diabetes, morbid obesity, acute renal failure presents to the emergency department for evaluation of fatigue.  She reports progressive, profound fatigue which has been worsening for the past 2 weeks.  This is aggravated by activity with associated dyspnea on exertion.  She has been waking in the middle of the night to go to the nearby gas station to buy 2 large cups of ice to 2.  She feels that her iron may be low.  She has never required a blood transfusion, but reports that she has heavy vaginal bleeding associated with uterine fibroids.  She has no abdominal or pelvic pain.  Reports that her bleeding is currently stopped.  For the past 48 hours the bleeding was so heavy it required the use of 2 tampons, incontinence pads, as well as a maxi pad.  She would have to change these every 1-2 hours for bleeding management.  She has been passing large clots.  Is not on chronic anticoagulation.  The history is provided by the patient. No language interpreter was used.  Weakness Associated symptoms: no abdominal pain and no fever        Home Medications Prior to Admission medications   Medication Sig Start Date End Date Taking? Authorizing Provider  acetaminophen (TYLENOL) 650 MG CR tablet Take 650-1,300 mg by mouth every 8 (eight) hours as needed for pain.    [provider]  albuterol (VENTOLIN HFA) 108 (90 Base) MCG/ACT inhaler Inhale 2 puffs into the lungs every 6 (six) hours as needed for wheezing or shortness of breath.  06/15/15 08/31/22  [provider]  Continuous Blood Gluc Sensor (FREESTYLE LIBRE 2 SENSOR) MISC 1 Device by Does not  apply route every 14 (fourteen) days. 04/17/21   Shamleffer, Konrad Dolores, MD  Continuous Blood Gluc Transmit (DEXCOM G6 TRANSMITTER) MISC 1 Device by Does not apply route as directed. Patient not taking: Reported on 04/17/2021 04/06/20   Shamleffer, Konrad Dolores, MD  dapagliflozin propanediol (FARXIGA) 5 MG TABS tablet Take 1 tablet (5 mg total) by mouth daily before breakfast. 04/17/21   Shamleffer, Konrad Dolores, MD  diclofenac Sodium (VOLTAREN) 1 % GEL Apply 2 g topically 4 (four) times daily. 03/09/21   Nita Sickle, MD  gabapentin (NEURONTIN) 100 MG capsule Take 200 mg by mouth 2 (two) times a day.    [provider]  gabapentin (NEURONTIN) 300 MG capsule Take 300 mg by mouth 2 (two) times daily. 01/21/21   [provider]  insulin glargine (LANTUS) 100 UNIT/ML injection Inject 45 Units into the skin daily. 05/16/21   Shamleffer, Konrad Dolores, MD  insulin glargine-yfgn (SEMGLEE, YFGN,) 100 UNIT/ML injection Inject 0.45 mLs (45 Units total) into the skin daily. 04/20/21   Shamleffer, Konrad Dolores, MD  insulin lispro (HUMALOG KWIKPEN) 100 UNIT/ML KwikPen Max daily 70 units 04/17/21   Shamleffer, Konrad Dolores, MD  Insulin Pen Needle (B-D UF III MINI PEN NEEDLES) 31G X 5 MM MISC 1 Device by Does not apply route in the morning, at noon, in the evening, and at bedtime. 04/17/21   Shamleffer, Konrad Dolores, MD  metFORMIN (GLUCOPHAGE) 500 MG tablet Take 2 tablets (1,000  mg total) by mouth 2 (two) times daily with a meal. 04/17/21   Shamleffer, Konrad Dolores, MD  oxyCODONE (ROXICODONE) 5 MG immediate release tablet Take 1 tablet (5 mg total) by mouth every 6 (six) hours as needed for up to 8 doses for severe pain. 10/01/20   Koleen Distance, MD  sertraline (ZOLOFT) 25 MG tablet Take 25 mg by mouth daily.    [provider]      Allergies    Codeine, Atorvastatin, Iodinated contrast media, and Metrizamide    Review of Systems   Review of Systems   Constitutional:  Negative for fever.  Gastrointestinal:  Negative for abdominal pain.  Neurological:  Positive for weakness. Negative for syncope.  Ten systems reviewed and are negative for acute change, except as noted in the HPI.    Physical Exam Updated Vital Signs BP (!) 136/46 (BP Location: Right Arm)   Pulse 81   Temp 97.9 F (36.6 C) (Oral)   Resp 20   Ht 5\' 7"  (1.702 m)   Wt (!) 146.2 kg   SpO2 97%   BMI 50.48 kg/m   Physical Exam Vitals and nursing note reviewed.  Constitutional:      General: She is not in acute distress.    Appearance: She is well-developed. She is not diaphoretic.     Comments: Morbidly obese, pleasant AA female.  HENT:     Head: Normocephalic and atraumatic.  Eyes:     General: No scleral icterus.    Extraocular Movements: EOM normal.     Conjunctiva/sclera: Conjunctivae normal.  Pulmonary:     Effort: Pulmonary effort is normal. No respiratory distress.     Comments: Respirations even and unlabored Genitourinary:    Comments: Exam chaperoned by RN. Scant blood in vaginal vault without active bleeding. No evidence of vaginal trauma. No clots present. Musculoskeletal:        General: Normal range of motion.     Cervical back: Normal range of motion.  Skin:    General: Skin is warm and dry.     Coloration: Skin is not pale.     Findings: No erythema or rash.  Neurological:     Mental Status: She is alert and oriented to person, place, and time.     Coordination: Coordination normal.  Psychiatric:        Mood and Affect: Mood and affect normal.        Behavior: Behavior normal.     ED Results / Procedures / Treatments   Labs (all labs ordered are listed, but only abnormal results are displayed) Labs Reviewed  BASIC METABOLIC PANEL - Abnormal; Notable for the following components:      Result Value   Sodium 127 (*)    Potassium 3.4 (*)    Chloride 94 (*)    Glucose, Bld 407 (*)    All other components within normal limits  CBC  - Abnormal; Notable for the following components:   RBC 3.36 (*)    Hemoglobin 6.8 (*)    HCT 23.6 (*)    MCV 70.2 (*)    MCH 20.2 (*)    MCHC 28.8 (*)    RDW 15.9 (*)    All other components within normal limits  CBG MONITORING, ED - Abnormal; Notable for the following components:   Glucose-Capillary 423 (*)    All other components within normal limits  HCG, SERUM, QUALITATIVE  URINALYSIS, ROUTINE W REFLEX MICROSCOPIC  TYPE AND SCREEN  EKG EKG Interpretation Date/Time:  Saturday August 31 2022 21:18:52 EDT Ventricular Rate:  79 PR Interval:  181 QRS Duration:  110 QT Interval:  382 QTC Calculation: 438 R Axis:   7  Text Interpretation: Sinus rhythm Incomplete left bundle branch block Probable left ventricular hypertrophy Confirmed by Tilden Fossa [phone]) on 08/31/2022 11:23:25 PM  Radiology US Pelvis Complete  Result Date: 08/31/2022 CLINICAL DATA:  Vaginal bleeding for 1 month. EXAM: TRANSABDOMINAL ULTRASOUND OF PELVIS TECHNIQUE: Transabdominal ultrasound examination of the pelvis was performed including evaluation of the uterus, ovaries, adnexal regions, and pelvic cul-de-sac. COMPARISON:  CT pelvis 11/08/2016 FINDINGS: Uterus Measurements: 14.4 x 9.2 x 10.5 cm = volume: 733 mL. Submucosal masses likely representing fibroids measure 7.8 x 5.8 x 8.6 cm and 5.6 x 4.1 x 4.7 cm, respectively. Endometrium The endometrium is obscured by uterine masses. Right ovary Not visualized. Left ovary Not visualized. Other findings:  No abnormal free fluid. IMPRESSION: 1. Enlarged uterus with submucosal masses likely representing fibroids. 2. The endometrium is obscured by submucosal fibroids. In the setting of post-menopausal bleeding, consider gynecology consult and/or sonohysterogram for focal lesion work-up. 3. Nonvisualization of the ovaries. Electronically Signed   By: Minerva Fester M.D.   On: 08/31/2022 23:28    Procedures .Critical Care  Performed by: Antony Madura, PA-C Authorized  by: Antony Madura, PA-C   Critical care provider statement:    Critical care time (minutes):  30   Critical care time was exclusive of:  Separately billable procedures and treating other patients   Critical care was necessary to treat or prevent imminent or life-threatening deterioration of the following conditions:  Circulatory failure (symptomatic anemia)   Critical care was time spent personally by me on the following activities:  Development of treatment plan with patient or surrogate, discussions with consultants, evaluation of patient's response to treatment, examination of patient, ordering and review of laboratory studies, ordering and review of radiographic studies, ordering and performing treatments and interventions, pulse oximetry, re-evaluation of patient's condition and review of old charts   {Document cardiac monitor, telemetry assessment procedure when appropriate:1}  Medications Ordered in ED Medications - No data to display  ED Course/ Medical Decision Making/ A&P Clinical Course as of 08/31/22 2353  Sat Aug 31, 2022  2218 Hemoglobin, per Care Everywhere, noted to be 10.6 on November 30th. Her baseline is around 11. [KH]    Clinical Course User Index [KH] Antony Madura, PA-C   {   Click here for ABCD2, HEART and other calculatorsREFRESH Note before signing :1}                          Medical Decision Making Amount and/or Complexity of Data Reviewed Radiology: ordered.   ***  {Document critical care time when appropriate:1} {Document review of labs and clinical decision tools ie heart score, Chads2Vasc2 etc:1}  {Document your independent review of radiology images, and any outside records:1} {Document your discussion with family members, caretakers, and with consultants:1} {Document social determinants of health affecting pt's care:1} {Document your decision making why or why not admission, treatments were needed:1} Final Clinical Impression(s) / ED  Diagnoses Final diagnoses:  None    Rx / DC Orders ED Discharge Orders     None

## 2022-09-01 DIAGNOSIS — D62 Acute posthemorrhagic anemia: Secondary | ICD-10-CM | POA: Diagnosis not present

## 2022-09-01 LAB — BPAM RBC
Blood Product Expiration Date: 202408202359
Blood Product Expiration Date: 202408202359
ISSUE DATE / TIME: 202407280512
Unit Type and Rh: 6200
Unit Type and Rh: 6200

## 2022-09-01 LAB — GLUCOSE, CAPILLARY
Glucose-Capillary: 108 mg/dL — ABNORMAL HIGH (ref 70–99)
Glucose-Capillary: 115 mg/dL — ABNORMAL HIGH (ref 70–99)
Glucose-Capillary: 182 mg/dL — ABNORMAL HIGH (ref 70–99)
Glucose-Capillary: 185 mg/dL — ABNORMAL HIGH (ref 70–99)
Glucose-Capillary: 292 mg/dL — ABNORMAL HIGH (ref 70–99)

## 2022-09-01 LAB — URINALYSIS, ROUTINE W REFLEX MICROSCOPIC
Bacteria, UA: NONE SEEN
Bilirubin Urine: NEGATIVE
Glucose, UA: >=500 mg/dL — AB
Ketones, ur: NEGATIVE mg/dL
Leukocytes,Ua: NEGATIVE
Nitrite: NEGATIVE
Protein, ur: NEGATIVE mg/dL
Specific Gravity, Urine: 1.012 (ref 1.005–1.030)
pH: 6 (ref 5.0–8.0)

## 2022-09-01 LAB — PREPARE RBC (CROSSMATCH)

## 2022-09-01 LAB — BASIC METABOLIC PANEL
Anion gap: 9 (ref 5–15)
BUN: 12 mg/dL (ref 6–20)
CO2: 23 mmol/L (ref 22–32)
Calcium: 8.9 mg/dL (ref 8.9–10.3)
Chloride: 104 mmol/L (ref 98–111)
Creatinine, Ser: 0.65 mg/dL (ref 0.44–1.00)
GFR, Estimated: >60 mL/min (ref >60)
Glucose, Bld: 101 mg/dL — ABNORMAL HIGH (ref 70–99)
Potassium: 3.6 mmol/L (ref 3.5–5.1)
Sodium: 136 mmol/L (ref 135–145)

## 2022-09-01 LAB — CBC
HCT: 22.3 % — ABNORMAL LOW (ref 36.0–46.0)
Hemoglobin: 6.5 g/dL — CL (ref 12.0–15.0)
MCH: 20.4 pg — ABNORMAL LOW (ref 26.0–34.0)
MCHC: 29.1 g/dL — ABNORMAL LOW (ref 30.0–36.0)
MCV: 69.9 fL — ABNORMAL LOW (ref 80.0–100.0)
Platelets: 222 10*3/uL (ref 150–400)
RBC: 3.19 MIL/uL — ABNORMAL LOW (ref 3.87–5.11)
RDW: 15.8 % — ABNORMAL HIGH (ref 11.5–15.5)
WBC: 5.9 10*3/uL (ref 4.0–10.5)
nRBC: 0 % (ref 0.0–0.2)

## 2022-09-01 LAB — HEMOGLOBIN AND HEMATOCRIT, BLOOD
HCT: 27.7 % — ABNORMAL LOW (ref 36.0–46.0)
Hemoglobin: 8.2 g/dL — ABNORMAL LOW (ref 12.0–15.0)

## 2022-09-01 LAB — CBG MONITORING, ED
Glucose-Capillary: 378 mg/dL — ABNORMAL HIGH (ref 70–99)
Glucose-Capillary: 144 mg/dL — ABNORMAL HIGH (ref 70–99)

## 2022-09-01 LAB — ABO/RH: ABO/RH(D): A POS

## 2022-09-01 MED ORDER — INSULIN ASPART 100 UNIT/ML IJ SOLN
0.0000 [IU] | INTRAMUSCULAR | Status: DC
  Administered 2022-09-01: 2 [IU] via SUBCUTANEOUS
  Administered 2022-09-01: 20 [IU] via SUBCUTANEOUS
  Administered 2022-09-01: 4 [IU] via SUBCUTANEOUS
  Administered 2022-09-01: 12 [IU] via SUBCUTANEOUS
  Administered 2022-09-01 – 2022-09-02 (×2): 4 [IU] via SUBCUTANEOUS
  Administered 2022-09-02 (×2): 12 [IU] via SUBCUTANEOUS
  Administered 2022-09-02: 4 [IU] via SUBCUTANEOUS
  Administered 2022-09-02: 12 [IU] via SUBCUTANEOUS
  Administered 2022-09-03: 8 [IU] via SUBCUTANEOUS
  Administered 2022-09-03: 12 [IU] via SUBCUTANEOUS
  Administered 2022-09-03: 2 [IU] via SUBCUTANEOUS
  Administered 2022-09-03: 4 [IU] via SUBCUTANEOUS
  Filled 2022-09-01: qty 0.24

## 2022-09-01 MED ORDER — SODIUM CHLORIDE 0.9% IV SOLUTION: Freq: Once | INTRAVENOUS | Status: AC

## 2022-09-01 MED ORDER — ACETAMINOPHEN 650 MG RE SUPP: 650.0000 mg | Freq: Four times a day (QID) | RECTAL | Status: DC | PRN

## 2022-09-01 MED ORDER — ACETAMINOPHEN 325 MG PO TABS
650.0000 mg | ORAL_TABLET | Freq: Four times a day (QID) | ORAL | Status: DC | PRN
  Filled 2022-09-01: qty 2

## 2022-09-01 MED ORDER — SERTRALINE HCL 50 MG PO TABS
150.0000 mg | ORAL_TABLET | Freq: Every day | ORAL | Status: DC
  Administered 2022-09-01 – 2022-09-03 (×3): 150 mg via ORAL
  Filled 2022-09-01 (×3): qty 1

## 2022-09-01 MED ORDER — ONDANSETRON HCL 4 MG PO TABS: 4.0000 mg | ORAL_TABLET | Freq: Four times a day (QID) | ORAL | Status: DC | PRN

## 2022-09-01 MED ORDER — INSULIN DETEMIR 100 UNIT/ML ~~LOC~~ SOLN: 20.0000 [IU] | Freq: Every day | SUBCUTANEOUS | Status: DC

## 2022-09-01 MED ORDER — PANTOPRAZOLE SODIUM 20 MG PO TBEC
20.0000 mg | DELAYED_RELEASE_TABLET | Freq: Every day | ORAL | Status: DC
  Administered 2022-09-01 – 2022-09-02 (×2): 20 mg via ORAL
  Filled 2022-09-01 (×3): qty 1

## 2022-09-01 MED ORDER — ONDANSETRON HCL 4 MG/2ML IJ SOLN
4.0000 mg | Freq: Four times a day (QID) | INTRAMUSCULAR | Status: DC | PRN
  Administered 2022-09-01 – 2022-09-03 (×2): 4 mg via INTRAVENOUS
  Filled 2022-09-01 (×2): qty 2

## 2022-09-01 MED ORDER — NORETHINDRONE ACETATE 5 MG PO TABS
5.0000 mg | ORAL_TABLET | Freq: Every day | ORAL | Status: DC
  Administered 2022-09-01 – 2022-09-03 (×3): 5 mg via ORAL
  Filled 2022-09-01 (×4): qty 1

## 2022-09-01 MED ORDER — INSULIN DETEMIR 100 UNIT/ML ~~LOC~~ SOLN
35.0000 [IU] | Freq: Every day | SUBCUTANEOUS | Status: DC
  Administered 2022-09-01 – 2022-09-03 (×3): 35 [IU] via SUBCUTANEOUS
  Filled 2022-09-01 (×3): qty 0.35

## 2022-09-01 MED ORDER — POTASSIUM CHLORIDE 20 MEQ PO PACK
40.0000 meq | PACK | Freq: Two times a day (BID) | ORAL | Status: DC
  Administered 2022-09-01 – 2022-09-03 (×6): 40 meq via ORAL
  Filled 2022-09-01 (×6): qty 2

## 2022-09-01 MED ORDER — SODIUM CHLORIDE 0.9% IV SOLUTION: Freq: Once | INTRAVENOUS | Status: DC

## 2022-09-01 NOTE — ED Notes (Signed)
ED TO INPATIENT HANDOFF REPORT  Name/Age/Gender [name] 52 y.o. female  Code Status    Code Status Orders  (From admission, onward)           Start     Ordered   09/01/22 0048  Full code  Continuous       Question:  By:  Answer:  Consent: discussion documented in EHR   09/01/22 0052           Code Status History     Date Active Date Inactive Code Status Order ID Comments User Context   10/19/2015 1542 11/03/2015 2251 Full Code [id number]  Claud Kelp, MD Inpatient   10/19/2015 1538 10/19/2015 1542 Full Code [id number]  Joseph Art, DO Inpatient       Home/SNF/Other Home  Chief Complaint Acute blood loss anemia [D62]  Level of Care/Admitting Diagnosis ED Disposition     ED Disposition  Admit   Condition  --   Comment  Hospital Area: [facility] [[id number]]  Level of Care: Med-Surg [16]  May place patient in observation at [facility] or [facility] if equivalent level of care is available:: Yes  Covid Evaluation: Asymptomatic - no recent exposure (last 10 days) testing not required  Diagnosis: Acute blood loss anemia [629528]  Admitting Physician: Alan Mulder [[id number]]  Attending Physician: Alan Mulder [phone]          Medical History Past Medical History:  Diagnosis Date   Acute renal failure (ARF) (HCC)    Allergy    Depression    Diabetes mellitus    Morbid obesity (HCC)     Allergies Allergies  Allergen Reactions   Codeine Shortness Of Breath   Atorvastatin Other (See Comments)    DIFFUSE MYALGIA, RESOLVED AFTER STOPPING RX; SHE TRIED IT AGAIN BUT DEVELOPED DIFFUSE MYALGIA AND REFUSES A DIFFERENT STATIN TRIAL  Other Reaction(s): Other (See Comments)   Iodinated Contrast Media Other (See Comments)    Kidney shutdown  Other reaction(s): Other (See Comments)  ACUTE TUBULAR NECROSIS AFTER IV CONTRAST ADMINISTRATION FOR CT  Other Reaction(s): Other (See Comments)   Metrizamide Other (See  Comments)    Kidney shutdown Kidney shutdown     IV Location/Drains/Wounds Patient Lines/Drains/Airways Status     Active Line/Drains/Airways     Name Placement date Placement time Site Days   Peripheral IV 09/01/22 Anterior;Left;Proximal Forearm 09/01/22  0524  Forearm  less than 1            Labs/Imaging Results for orders placed or performed during the hospital encounter of 08/31/22 (from the past 48 hour(s))  CBG monitoring, ED     Status: Abnormal   Collection Time: 08/31/22  9:10 PM  Result Value Ref Range   Glucose-Capillary 423 (H) 70 - 99 mg/dL    Comment: Glucose reference range applies only to samples taken after fasting for at least 8 hours.  Urinalysis, Routine w reflex microscopic -Urine, Clean Catch     Status: Abnormal   Collection Time: 08/31/22  9:14 PM  Result Value Ref Range   Color, Urine STRAW (A) YELLOW   APPearance CLEAR CLEAR   Specific Gravity, Urine 1.012 1.005 - 1.030   pH 6.0 5.0 - 8.0   Glucose, UA >=500 (A) NEGATIVE mg/dL   Hgb urine dipstick MODERATE (A) NEGATIVE   Bilirubin Urine NEGATIVE NEGATIVE   Ketones, ur NEGATIVE NEGATIVE mg/dL   Protein, ur NEGATIVE NEGATIVE mg/dL   Nitrite NEGATIVE NEGATIVE   Leukocytes,Ua NEGATIVE  NEGATIVE   RBC / HPF 0-5 0 - 5 RBC/hpf   WBC, UA 0-5 0 - 5 WBC/hpf   Bacteria, UA NONE SEEN NONE SEEN   Squamous Epithelial / HPF 0-5 0 - 5 /HPF   Mucus PRESENT     Comment: Performed at [facility], [address], [place], [place] [address]  Basic metabolic panel     Status: Abnormal   Collection Time: 08/31/22  9:33 PM  Result Value Ref Range   Sodium 127 (L) 135 - 145 mmol/L   Potassium 3.4 (L) 3.5 - 5.1 mmol/L   Chloride 94 (L) 98 - 111 mmol/L   CO2 22 22 - 32 mmol/L   Glucose, Bld 407 (H) 70 - 99 mg/dL    Comment: Glucose reference range applies only to samples taken after fasting for at least 8 hours.   BUN 17 6 - 20 mg/dL   Creatinine, Ser 3.22 0.44 - 1.00 mg/dL   Calcium 8.9 8.9  - 02.5 mg/dL   GFR, Estimated >42 >70 mL/min    Comment: (NOTE) Calculated using the CKD-EPI Creatinine Equation (2021)    Anion gap 11 5 - 15    Comment: Performed at [facility], [address] [place], [place] [address]  CBC     Status: Abnormal   Collection Time: 08/31/22  9:33 PM  Result Value Ref Range   WBC 6.9 4.0 - 10.5 K/uL   RBC 3.36 (L) 3.87 - 5.11 MIL/uL   Hemoglobin 6.8 (LL) 12.0 - 15.0 g/dL    Comment: REPEATED TO VERIFY Reticulocyte Hemoglobin testing may be clinically indicated, consider ordering this additional test [id number] THIS CRITICAL RESULT HAS VERIFIED AND BEEN CALLED TO METCALF,A BY PATRICIA LUZOLO ON 07 27 2024 AT 2210, AND HAS BEEN READ BACK. CRITICAL RESULT VERIFIED    HCT 23.6 (L) 36.0 - 46.0 %   MCV 70.2 (L) 80.0 - 100.0 fL   MCH 20.2 (L) 26.0 - 34.0 pg   MCHC 28.8 (L) 30.0 - 36.0 g/dL   RDW 16.0 (H) 73.7 - 10.6 %   Platelets 264 150 - 400 K/uL   nRBC 0.0 0.0 - 0.2 %    Comment: Performed at [facility], [address], [place], [place] [address]  hCG, serum, qualitative     Status: None   Collection Time: 08/31/22  9:33 PM  Result Value Ref Range   Preg, Serum NEGATIVE NEGATIVE    Comment:        THE SENSITIVITY OF THIS METHODOLOGY IS >10 mIU/mL. Performed at [facility], [address], [place], [place] [address]   ABO/Rh     Status: None   Collection Time: 08/31/22  9:33 PM  Result Value Ref Range   ABO/RH(D)      A POS Performed at [facility], [address], [place], [place] [address]   CBG monitoring, ED     Status: Abnormal   Collection Time: 09/01/22  1:18 AM  Result Value Ref Range   Glucose-Capillary 378 (H) 70 - 99 mg/dL    Comment: Glucose reference range applies only to samples taken after fasting for at least 8 hours.  Type and screen [facility]     Status: None (Preliminary result)   Collection Time: 09/01/22  2:31 AM   Result Value Ref Range   ABO/RH(D) A POS    Antibody Screen NEG    Sample Expiration 09/04/2022,2359  Unit Number [id number]    Blood Component Type RED CELLS,LR    Unit division 00    Status of Unit ISSUED    Transfusion Status OK TO TRANSFUSE    Crossmatch Result      Compatible Performed at [facility], [address], [place], [place] [address]    Unit Number [id number]    Blood Component Type RED CELLS,LR    Unit division 00    Status of Unit ALLOCATED    Transfusion Status OK TO TRANSFUSE    Crossmatch Result Compatible   Prepare RBC (crossmatch)     Status: None   Collection Time: 09/01/22  2:31 AM  Result Value Ref Range   Order Confirmation      ORDER PROCESSED BY BLOOD BANK Performed at [facility], [address], [place], [place] [address]   CBC     Status: Abnormal   Collection Time: 09/01/22  3:52 AM  Result Value Ref Range   WBC 5.9 4.0 - 10.5 K/uL   RBC 3.19 (L) 3.87 - 5.11 MIL/uL   Hemoglobin 6.5 (LL) 12.0 - 15.0 g/dL    Comment: CRITICAL VALUE NOTED.  VALUE IS CONSISTENT WITH PREVIOUSLY REPORTED AND CALLED VALUE. REPEATED TO VERIFY Reticulocyte Hemoglobin testing may be clinically indicated, consider ordering this additional test [id number]    HCT 22.3 (L) 36.0 - 46.0 %   MCV 69.9 (L) 80.0 - 100.0 fL   MCH 20.4 (L) 26.0 - 34.0 pg   MCHC 29.1 (L) 30.0 - 36.0 g/dL   RDW 75.6 (H) 43.3 - 29.5 %   Platelets 222 150 - 400 K/uL   nRBC 0.0 0.0 - 0.2 %    Comment: Performed at [facility], [address], [place], [place] [address]  CBG monitoring, ED     Status: Abnormal   Collection Time: 09/01/22  5:52 AM  Result Value Ref Range   Glucose-Capillary 144 (H) 70 - 99 mg/dL    Comment: Glucose reference range applies only to samples taken after fasting for at least 8 hours.   US Pelvis Complete  Result Date: 08/31/2022 CLINICAL DATA:  Vaginal bleeding for 1 month. EXAM: TRANSABDOMINAL  ULTRASOUND OF PELVIS TECHNIQUE: Transabdominal ultrasound examination of the pelvis was performed including evaluation of the uterus, ovaries, adnexal regions, and pelvic cul-de-sac. COMPARISON:  CT pelvis 11/08/2016 FINDINGS: Uterus Measurements: 14.4 x 9.2 x 10.5 cm = volume: 733 mL. Submucosal masses likely representing fibroids measure 7.8 x 5.8 x 8.6 cm and 5.6 x 4.1 x 4.7 cm, respectively. Endometrium The endometrium is obscured by uterine masses. Right ovary Not visualized. Left ovary Not visualized. Other findings:  No abnormal free fluid. IMPRESSION: 1. Enlarged uterus with submucosal masses likely representing fibroids. 2. The endometrium is obscured by submucosal fibroids. In the setting of post-menopausal bleeding, consider gynecology consult and/or sonohysterogram for focal lesion work-up. 3. Nonvisualization of the ovaries. Electronically Signed   By: Minerva Fester M.D.   On: 08/31/2022 23:28    Pending Labs Unresulted Labs (From admission, onward)    None       Vitals/Pain Today's Vitals   08/31/22 2315 09/01/22 0354 09/01/22 0523 09/01/22 0541  BP:  (!) 107/53 (!) 117/48 (!) 112/43  Pulse:  70 79 72  Resp:  16 16 16   Temp:  98.4 F (36.9 C) 98.4 F (36.9 C) 98.3 F (36.8 C)  TempSrc: Oral Oral  Oral  SpO2: 97% 95% 100% 100%  Weight:  Height:      PainSc: 8        Isolation Precautions No active isolations  Medications Medications  norethindrone (AYGESTIN) tablet 5 mg (has no administration in time range)  insulin aspart (novoLOG) injection 0-24 Units (2 Units Subcutaneous Given 09/01/22 0553)  potassium chloride (KLOR-CON) packet 40 mEq (40 mEq Oral Given 09/01/22 0111)  acetaminophen (TYLENOL) tablet 650 mg (has no administration in time range)    Or  acetaminophen (TYLENOL) suppository 650 mg (has no administration in time range)  ondansetron (ZOFRAN) tablet 4 mg (has no administration in time range)    Or  ondansetron (ZOFRAN) injection 4 mg (has no  administration in time range)  sertraline (ZOLOFT) tablet 150 mg (has no administration in time range)  pantoprazole (PROTONIX) EC tablet 20 mg (has no administration in time range)  insulin detemir (LEVEMIR) injection 35 Units (35 Units Subcutaneous Given 09/01/22 0135)  0.9 %  sodium chloride infusion (Manually program via Guardrails IV Fluids) (has no administration in time range)  0.9 %  sodium chloride infusion (Manually program via Guardrails IV Fluids) ( Intravenous New Bag/Given 09/01/22 0559)    Mobility walks

## 2022-09-01 NOTE — Plan of Care (Signed)
  Problem: Education: Goal: Knowledge of General Education information will improve Description: Including pain rating scale, medication(s)/side effects and non-pharmacologic comfort measures Outcome: Progressing   Problem: Health Behavior/Discharge Planning: Goal: Ability to manage health-related needs will improve Outcome: Progressing   Problem: Clinical Measurements: Goal: Ability to maintain clinical measurements within normal limits will improve Outcome: Progressing   Problem: Coping: Goal: Level of anxiety will decrease Outcome: Progressing   Problem: Pain Managment: Goal: General experience of comfort will improve Outcome: Progressing   Problem: Safety: Goal: Ability to remain free from injury will improve Outcome: Progressing   Problem: Skin Integrity: Goal: Risk for impaired skin integrity will decrease Outcome: Progressing   Problem: Clinical Measurements: Goal: Will remain free from infection Outcome: Not Progressing Goal: Diagnostic test results will improve Outcome: Not Progressing

## 2022-09-01 NOTE — H&P (Signed)
History and Physical    [name] WGN:[id number] DOB: 1970/05/07 DOA: 08/31/2022  PCP: Verlon Au, MD   Chief Complaint: tiredness  HPI: [name] is a 52 y.o. female with medical history significant of depression, diabetes, obesity presents emergency department with 1 week of worsening weakness.  She is also been eating ice.  She presented to the emergency department where she was found to be afebrile and hemodynamically stable.  On arrival she was endorsing 2 weeks of progressively worsening profound weakness and shortness of breath.  She been waking up in the middle the night and having to chew on ice.  She is never had issues with anemia before however she has had heavy vaginal bleeding due to her fibroids.  She states that she would use tampons every hour and would continue to pass large clots.  In the emergency department labs were obtained which revealed hemoglobin of 6.8 from baseline between 10 and 11.  Remaining labs revealed sodium 127, potassium 3.4, glucose 407, urinalysis negative for infection.  Ultrasound of pelvis showed enlarged uterus with fibroids.   Review of Systems: Review of Systems  Constitutional: Negative.   Skin: Negative.   All other systems reviewed and are negative.    As per HPI otherwise 10 point review of systems negative.   Allergies  Allergen Reactions   Codeine Shortness Of Breath   Atorvastatin Other (See Comments)    DIFFUSE MYALGIA, RESOLVED AFTER STOPPING RX; SHE TRIED IT AGAIN BUT DEVELOPED DIFFUSE MYALGIA AND REFUSES A DIFFERENT STATIN TRIAL  Other Reaction(s): Other (See Comments)   Iodinated Contrast Media Other (See Comments)    Kidney shutdown  Other reaction(s): Other (See Comments)  ACUTE TUBULAR NECROSIS AFTER IV CONTRAST ADMINISTRATION FOR CT  Other Reaction(s): Other (See Comments)   Metrizamide Other (See Comments)    Kidney shutdown Kidney shutdown     Past Medical History:  Diagnosis Date    Acute renal failure (ARF) (HCC)    Allergy    Depression    Diabetes mellitus    Morbid obesity (HCC)     Past Surgical History:  Procedure Laterality Date   INCISION AND DRAINAGE ABSCESS N/A 10/21/2015   Procedure: dressing change to  PRESACRAL ABCESS;  Surgeon: Romie Levee, MD;  Location: [facility];  Service: General;  Laterality: N/A;  dressing to sacral area   IR GENERIC HISTORICAL  10/27/2015   IR FLUORO GUIDE CV LINE RIGHT 10/27/2015 Gilmer Mor, DO [facility]   IR GENERIC HISTORICAL  10/27/2015   IR US GUIDE VASC ACCESS RIGHT 10/27/2015 Gilmer Mor, DO [facility]   IRRIGATION AND DEBRIDEMENT ABSCESS N/A 10/19/2015   Procedure: IRRIGATION AND DEBRIDEMENT PRESACRAL ABSCESS;  Surgeon: Claud Kelp, MD;  Location: [facility];  Service: General;  Laterality: N/A;   TUBAL LIGATION       reports that she has never smoked. She has never used smokeless tobacco. She reports that she does not drink alcohol and does not use drugs.  Family History  Problem Relation Age of Onset   Diabetes Mother    Lymphoma Father    Lymphoma Brother     Prior to Admission medications   Medication Sig Start Date End Date Taking? Authorizing Provider  acetaminophen (TYLENOL) 650 MG CR tablet Take 650-1,300 mg by mouth every 8 (eight) hours as needed for pain.    [provider]  albuterol (VENTOLIN HFA) 108 (90 Base) MCG/ACT inhaler Inhale 2 puffs into the lungs every 6 (six) hours  as needed for wheezing or shortness of breath.  06/15/15 08/31/22  [provider]  Continuous Blood Gluc Sensor (FREESTYLE LIBRE 2 SENSOR) MISC 1 Device by Does not apply route every 14 (fourteen) days. 04/17/21   Price, Madeline Dolores, MD  Continuous Blood Gluc Transmit (DEXCOM G6 TRANSMITTER) MISC 1 Device by Does not apply route as directed. Patient not taking: Reported on 04/17/2021 04/06/20   Price, Madeline Dolores, MD  dapagliflozin propanediol (FARXIGA) 5 MG TABS tablet Take 1 tablet (5 mg total)  by mouth daily before breakfast. 04/17/21   Price, Madeline Dolores, MD  diclofenac Sodium (VOLTAREN) 1 % GEL Apply 2 g topically 4 (four) times daily. 03/09/21   Nita Sickle, MD  gabapentin (NEURONTIN) 100 MG capsule Take 200 mg by mouth 2 (two) times a day.    [provider]  gabapentin (NEURONTIN) 300 MG capsule Take 300 mg by mouth 2 (two) times daily. 01/21/21   [provider]  insulin glargine (LANTUS) 100 UNIT/ML injection Inject 45 Units into the skin daily. 05/16/21   Price, Madeline Dolores, MD  insulin glargine-yfgn (SEMGLEE, YFGN,) 100 UNIT/ML injection Inject 0.45 mLs (45 Units total) into the skin daily. 04/20/21   Price, Madeline Dolores, MD  insulin lispro (HUMALOG KWIKPEN) 100 UNIT/ML KwikPen Max daily 70 units 04/17/21   Price, Madeline Dolores, MD  Insulin Pen Needle (B-D UF III MINI PEN NEEDLES) 31G X 5 MM MISC 1 Device by Does not apply route in the morning, at noon, in the evening, and at bedtime. 04/17/21   Price, Madeline Dolores, MD  metFORMIN (GLUCOPHAGE) 500 MG tablet Take 2 tablets (1,000 mg total) by mouth 2 (two) times daily with a meal. 04/17/21   Price, Madeline Dolores, MD  oxyCODONE (ROXICODONE) 5 MG immediate release tablet Take 1 tablet (5 mg total) by mouth every 6 (six) hours as needed for up to 8 doses for severe pain. 10/01/20   Koleen Distance, MD  sertraline (ZOLOFT) 25 MG tablet Take 25 mg by mouth daily.    [provider]    Physical Exam: Vitals:   08/31/22 2112 08/31/22 2122 08/31/22 2315  BP: (!) 188/93  (!) 136/46  Pulse: 86  81  Resp: (!) 22  20  Temp: 97.6 F (36.4 C)  97.9 F (36.6 C)  TempSrc: Oral  Oral  SpO2: 100%  97%  Weight:  (!) 146.2 kg   Height:  5\' 7"  (1.702 m)    Physical Exam Constitutional:      Appearance: She is normal weight.  HENT:     Head: Normocephalic.     Nose: Nose normal.     Mouth/Throat:     Mouth: Mucous membranes are moist.     Pharynx: Oropharynx is  clear.  Eyes:     Pupils: Pupils are equal, round, and reactive to light.  Cardiovascular:     Rate and Rhythm: Normal rate and regular rhythm.     Pulses: Normal pulses.  Pulmonary:     Effort: Pulmonary effort is normal.  Abdominal:     General: Bowel sounds are normal.  Musculoskeletal:        General: Normal range of motion.     Cervical back: Normal range of motion.  Skin:    General: Skin is warm.     Capillary Refill: Capillary refill takes less than 2 seconds.  Neurological:     General: No focal deficit present.     Mental Status: She is alert. Mental status  is at baseline.  Psychiatric:        Mood and Affect: Mood normal.        Labs on Admission: I have personally reviewed the patients's labs and imaging studies.  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 Observation status is judged to be reasonable and necessary in order to provide the required intensity of service to ensure the patient's safety. The patient's presenting symptoms, physical exam findings, and initial radiographic and laboratory data in the context of their medical condition is felt to place them at decreased risk for further clinical deterioration. Furthermore, it is anticipated that the patient will be medically stable for discharge from the hospital within 2 midnights of admission.      Alan Mulder MD [facility] If 7PM-7AM, please contact  night-coverage [email or web address]  09/01/2022, 12:42 AM

## 2022-09-02 DIAGNOSIS — D649 Anemia, unspecified: Secondary | ICD-10-CM | POA: Diagnosis not present

## 2022-09-02 DIAGNOSIS — D62 Acute posthemorrhagic anemia: Secondary | ICD-10-CM | POA: Diagnosis not present

## 2022-09-02 LAB — GLUCOSE, CAPILLARY
Glucose-Capillary: 134 mg/dL — ABNORMAL HIGH (ref 70–99)
Glucose-Capillary: 187 mg/dL — ABNORMAL HIGH (ref 70–99)
Glucose-Capillary: 192 mg/dL — ABNORMAL HIGH (ref 70–99)
Glucose-Capillary: 272 mg/dL — ABNORMAL HIGH (ref 70–99)
Glucose-Capillary: 279 mg/dL — ABNORMAL HIGH (ref 70–99)
Glucose-Capillary: 279 mg/dL — ABNORMAL HIGH (ref 70–99)

## 2022-09-02 LAB — PREPARE RBC (CROSSMATCH)

## 2022-09-02 MED ORDER — SODIUM CHLORIDE 0.9% IV SOLUTION: Freq: Once | INTRAVENOUS | Status: AC

## 2022-09-02 MED ORDER — INSULIN ASPART 100 UNIT/ML IJ SOLN
5.0000 [IU] | Freq: Three times a day (TID) | INTRAMUSCULAR | Status: DC
  Administered 2022-09-03 (×2): 5 [IU] via SUBCUTANEOUS

## 2022-09-02 MED ORDER — PANTOPRAZOLE SODIUM 40 MG PO TBEC
40.0000 mg | DELAYED_RELEASE_TABLET | Freq: Every day | ORAL | Status: DC
  Administered 2022-09-03: 40 mg via ORAL
  Filled 2022-09-02: qty 1

## 2022-09-02 MED ORDER — ALUM & MAG HYDROXIDE-SIMETH 200-200-20 MG/5ML PO SUSP
30.0000 mL | Freq: Four times a day (QID) | ORAL | Status: DC | PRN
  Administered 2022-09-02 – 2022-09-03 (×2): 30 mL via ORAL
  Filled 2022-09-02 (×2): qty 30

## 2022-09-02 NOTE — Progress Notes (Signed)
Delay in giving blood transfusion. The patient's IV site (left arm) is hurting. Requested IV consult to change IV Site. (Requested Stat order due to blood transfusion)

## 2022-09-02 NOTE — Progress Notes (Signed)
   09/02/22 0858  TOC Brief Assessment  Insurance and Status Reviewed  Patient has primary care physician Yes  Home environment has been reviewed Resids in apartment  Prior level of function: Independent at baseline  Prior/Current Home Services No current home services  Social Determinants of Health Reivew SDOH reviewed no interventions necessary  Readmission risk has been reviewed Yes  Transition of care needs no transition of care needs at this time

## 2022-09-02 NOTE — Progress Notes (Signed)
[facility]                                                                               [employer], is a 52 y.o. female, DOB - Apr 22, 1970, VWU:[id number] Admit date - 08/31/2022    Outpatient Primary MD for the patient is Verlon Au, MD  LOS - 0  days    Brief summary    52 y.o. female with medical history significant of depression, diabetes, obesity presents emergency department with 1 week of worsening weakness.  On arrival she was endorsing 2 weeks of progressively worsening profound weakness and shortness of breath. She been waking up in the middle the night and having to chew on ice. She is never had issues with anemia before however she has had heavy vaginal bleeding due to her fibroids. She states that she would use tampons every hour and would continue to pass large clots . She was found to have symptomatic anemia and admitted for the same. Gyn consulted.    Assessment & Plan    Assessment and Plan:  Acute anemia of blood loss from menorrhagia:  S/p 2 units prbc transfusion, repeat H&h Is pending.  Gyn consulted , recommended outpatient follow up for fibroids.  She was started on Aygestin   GERD ON PPI.  Mylanta added.    Type 2 insulin dependent DM.  CBG (last 3)  Recent Labs    09/02/22 0336 09/02/22 0716 09/02/22 1137  GLUCAP 192* 187* 279*   Resume SSI.  Resume levemir 35 units daily , add novolog 5 units TIDAC.    Body mass index is 50.48 kg/m. OBESITY Recommend outpatient follow up with PCP for weight loss.     Hypokalemia Replaced.    Estimated body mass index is 50.48 kg/m as calculated from the following:   Height as of this encounter: 5\' 7"  (1.702 m).   Weight as of this encounter: 146.2 kg.  Code Status: full code.  DVT Prophylaxis:  SCDs Start: 09/01/22 0048   Level of Care: Level of care: Med-Surg Family Communication: Updated patient's family at bedside  Disposition Plan:     Remains inpatient  appropriate:  symptomatic anemia.   Procedures:  None.   Consultants:   Gyn   Antimicrobials:   Anti-infectives (From admission, onward)    None        Medications  Scheduled Meds:  sodium chloride   Intravenous Once   insulin aspart  0-24 Units Subcutaneous Q4H   insulin aspart  5 Units Subcutaneous TID WC   insulin detemir  35 Units Subcutaneous Daily   norethindrone  5 mg Oral Daily   [START ON 09/03/2022] pantoprazole  40 mg Oral Daily   potassium chloride  40 mEq Oral BID   sertraline  150 mg Oral Daily   Continuous Infusions: PRN Meds:.acetaminophen **OR** acetaminophen, alum & mag hydroxide-simeth, ondansetron **OR** ondansetron (ZOFRAN) IV    Subjective:   [name] was seen and examined today.  Bleeding has slowed down.   Objective:   Vitals:   09/01/22 2129 09/02/22 0114 09/02/22 0602 09/02/22 1138  BP: 136/67 123/71 (!) 151/77 (!) 148/71  Pulse: 86 77 69  73  Resp: 16 18 16 17   Temp: 98.8 F (37.1 C) 98.7 F (37.1 C) 98.5 F (36.9 C) 98.7 F (37.1 C)  TempSrc: Oral Oral Oral Oral  SpO2: 98% 99% 97% 100%  Weight:      Height:        Intake/Output Summary (Last 24 hours) at 09/02/2022 1432 Last data filed at 09/02/2022 1259 Gross per 24 hour  Intake 720 ml  Output 850 ml  Net -130 ml   Filed Weights   08/31/22 2122  Weight: (!) 146.2 kg     Exam General: Alert and oriented x 3, NAD Cardiovascular: S1 S2 auscultated, no murmurs, RRR Respiratory: Clear to auscultation bilaterally, no wheezing, rales or rhonchi Gastrointestinal: Soft, nontender, nondistended, + bowel sounds Ext: no pedal edema bilaterally Neuro: AAOx3, Cr N's II- XII. Strength 5/5 upper and lower extremities bilaterally Skin: No rashes Psych: Normal affect and demeanor, alert and oriented x3    Data Reviewed:  I have personally reviewed following labs and imaging studies   CBC Lab Results  Component Value Date   WBC 6.2 09/02/2022   RBC 3.39 (L)  09/02/2022   HGB 7.4 (L) 09/02/2022   HCT 24.7 (L) 09/02/2022   MCV 72.9 (L) 09/02/2022   MCH 21.8 (L) 09/02/2022   PLT 252 09/02/2022   MCHC 30.0 09/02/2022   RDW 17.2 (H) 09/02/2022   LYMPHSABS 1.4 09/02/2022   MONOABS 0.4 09/02/2022   EOSABS 0.2 09/02/2022   BASOSABS 0.0 09/02/2022     Last metabolic panel Lab Results  Component Value Date   NA 135 09/02/2022   K 3.8 09/02/2022   CL 104 09/02/2022   CO2 23 09/02/2022   BUN 17 09/02/2022   CREATININE 0.72 09/02/2022   GLUCOSE 219 (H) 09/02/2022   GFRNONAA >60 09/02/2022   GFRAA >60 09/01/2019   CALCIUM 8.8 (L) 09/02/2022   PHOS 4.8 (H) 11/03/2015   PROT 8.2 (H) 08/23/2020   ALBUMIN 3.6 08/23/2020   LABGLOB 4.0 05/05/2013   AGRATIO 0.9 (L) 05/05/2013   BILITOT 0.4 08/23/2020   ALKPHOS 140 (H) 08/23/2020   AST 27 08/23/2020   ALT 17 08/23/2020   ANIONGAP 8 09/02/2022    CBG (last 3)  Recent Labs    09/02/22 0336 09/02/22 0716 09/02/22 1137  GLUCAP 192* 187* 279*      Coagulation Profile: No results for input(s): "INR", "PROTIME" in the last 168 hours.   Radiology Studies: US Pelvis Complete  Result Date: 08/31/2022 CLINICAL DATA:  Vaginal bleeding for 1 month. EXAM: TRANSABDOMINAL ULTRASOUND OF PELVIS TECHNIQUE: Transabdominal ultrasound examination of the pelvis was performed including evaluation of the uterus, ovaries, adnexal regions, and pelvic cul-de-sac. COMPARISON:  CT pelvis 11/08/2016 FINDINGS: Uterus Measurements: 14.4 x 9.2 x 10.5 cm = volume: 733 mL. Submucosal masses likely representing fibroids measure 7.8 x 5.8 x 8.6 cm and 5.6 x 4.1 x 4.7 cm, respectively. Endometrium The endometrium is obscured by uterine masses. Right ovary Not visualized. Left ovary Not visualized. Other findings:  No abnormal free fluid. IMPRESSION: 1. Enlarged uterus with submucosal masses likely representing fibroids. 2. The endometrium is obscured by submucosal fibroids. In the setting of post-menopausal bleeding,  consider gynecology consult and/or sonohysterogram for focal lesion work-up. 3. Nonvisualization of the ovaries. Electronically Signed   By: Minerva Fester M.D.   On: 08/31/2022 23:28       Kathlen Mody M.D. [facility] 09/02/2022, 2:32 PM  Available via Epic secure chat 7am-7pm After 7 pm, please  refer to night coverage provider listed on amion.

## 2022-09-02 NOTE — Inpatient Diabetes Management (Addendum)
Inpatient Diabetes Program Recommendations  AACE/ADA: New Consensus Statement on Inpatient Glycemic Control (2015)  Target Ranges:  Prepandial:   less than 140 mg/dL      Peak postprandial:   less than 180 mg/dL (1-2 hours)      Critically ill patients:  140 - 180 mg/dL    Latest Reference Range & Units 09/01/22 23:17 09/02/22 03:36 09/02/22 07:16 09/02/22 11:37  Glucose-Capillary 70 - 99 mg/dL 161 (H)  12 units Novolog  192 (H)  4 units Novolog  187 (H)  4 units Novolog  35 units Levemir @1210  279 (H)  (H): Data is abnormally high     Home DM Meds: Toujeo 45 units QPM      Humalog 12 units TID       Metformin 1000 mg BID    Current Orders: Levemir 35 units QAM    Novolog 0-24 units Q4H     MD- Please consider:  1. Change the Novolog SSI to the Moderate scale (0-15 units) TID AC + HS  2. Start Novolog Meal Coverage: Novolog 6 units TID with meals (50% home dose) HOLD if pt NPO HOLD if pt eats <50% meals         --Will follow patient during hospitalization--  Ambrose Finland RN, MSN, CDCES Diabetes Coordinator Inpatient Glycemic Control Team Team Pager: [phone] (8a-5p)

## 2022-09-03 ENCOUNTER — Other Ambulatory Visit (HOSPITAL_COMMUNITY): Payer: Self-pay

## 2022-09-03 DIAGNOSIS — K219 Gastro-esophageal reflux disease without esophagitis: Secondary | ICD-10-CM | POA: Diagnosis not present

## 2022-09-03 DIAGNOSIS — D649 Anemia, unspecified: Secondary | ICD-10-CM | POA: Diagnosis not present

## 2022-09-03 DIAGNOSIS — D62 Acute posthemorrhagic anemia: Secondary | ICD-10-CM | POA: Diagnosis not present

## 2022-09-03 LAB — CBC WITH DIFFERENTIAL/PLATELET
Abs Immature Granulocytes: 0.03 10*3/uL (ref 0.00–0.07)
Basophils Absolute: 0 10*3/uL (ref 0.0–0.1)
Basophils Relative: 0 %
Eosinophils Absolute: 0.2 10*3/uL (ref 0.0–0.5)
Eosinophils Relative: 4 %
HCT: 29.6 % — ABNORMAL LOW (ref 36.0–46.0)
Hemoglobin: 8.9 g/dL — ABNORMAL LOW (ref 12.0–15.0)
Immature Granulocytes: 1 %
Lymphocytes Relative: 22 %
Lymphs Abs: 1.3 10*3/uL (ref 0.7–4.0)
MCH: 22.9 pg — ABNORMAL LOW (ref 26.0–34.0)
MCHC: 30.1 g/dL (ref 30.0–36.0)
MCV: 76.3 fL — ABNORMAL LOW (ref 80.0–100.0)
Monocytes Absolute: 0.4 10*3/uL (ref 0.1–1.0)
Monocytes Relative: 7 %
Neutro Abs: 3.7 10*3/uL (ref 1.7–7.7)
Neutrophils Relative %: 66 %
Platelets: 219 10*3/uL (ref 150–400)
RBC: 3.88 MIL/uL (ref 3.87–5.11)
RDW: 18.6 % — ABNORMAL HIGH (ref 11.5–15.5)
WBC: 5.6 10*3/uL (ref 4.0–10.5)
nRBC: 0 % (ref 0.0–0.2)

## 2022-09-03 LAB — TYPE AND SCREEN
ABO/RH(D): A POS
Antibody Screen: NEGATIVE
Unit division: 0
Unit division: 0

## 2022-09-03 LAB — FERRITIN: Ferritin: 9 ng/mL — ABNORMAL LOW (ref 11–307)

## 2022-09-03 LAB — RETICULOCYTES
Immature Retic Fract: 26.1 % — ABNORMAL HIGH (ref 2.3–15.9)
RBC.: 4 MIL/uL (ref 3.87–5.11)
Retic Count, Absolute: 86.4 10*3/uL (ref 19.0–186.0)
Retic Ct Pct: 2.2 % (ref 0.4–3.1)

## 2022-09-03 LAB — IRON AND TIBC
Iron: 25 ug/dL — ABNORMAL LOW (ref 28–170)
Saturation Ratios: 6 % — ABNORMAL LOW (ref 10.4–31.8)
TIBC: 388 ug/dL (ref 250–450)
UIBC: 363 ug/dL

## 2022-09-03 LAB — GLUCOSE, CAPILLARY
Glucose-Capillary: 207 mg/dL — ABNORMAL HIGH (ref 70–99)
Glucose-Capillary: 179 mg/dL — ABNORMAL HIGH (ref 70–99)
Glucose-Capillary: 280 mg/dL — ABNORMAL HIGH (ref 70–99)

## 2022-09-03 LAB — VITAMIN B12: Vitamin B-12: 494 pg/mL (ref 180–914)

## 2022-09-03 LAB — FOLATE: Folate: 10.9 ng/mL (ref >5.9)

## 2022-09-03 MED ORDER — OMEPRAZOLE 20 MG PO CPDR
20.0000 mg | DELAYED_RELEASE_CAPSULE | Freq: Two times a day (BID) | ORAL | 2 refills | Status: AC
  Filled 2022-09-03: qty 60, 30d supply, fill #0

## 2022-09-03 MED ORDER — ONDANSETRON HCL 4 MG PO TABS
4.0000 mg | ORAL_TABLET | Freq: Four times a day (QID) | ORAL | 0 refills | Status: AC | PRN
  Filled 2022-09-03: qty 20, 5d supply, fill #0

## 2022-09-03 MED ORDER — NORETHINDRONE ACETATE 5 MG PO TABS
5.0000 mg | ORAL_TABLET | Freq: Every day | ORAL | 0 refills | Status: AC
  Filled 2022-09-03: qty 7, 7d supply, fill #0

## 2022-09-03 NOTE — Discharge Summary (Signed)
Physician Discharge Summary   Patient: [name] MRN: [id number] DOB: Mar 09, 1970  Admit date:     08/31/2022  Discharge date: 09/03/22  Discharge Physician: Kathlen Mody   PCP: Verlon Au, MD   Recommendations at discharge:  Please follow up with PCp in one week.  Please follow up with gyn in one week.  Please follow up with gastroenterology for gastritis, GERD for possible EGD.   Discharge Diagnoses: Principal Problem:   Acute blood loss anemia    Hospital Course:   52 y.o. female with medical history significant of depression, diabetes, obesity presents emergency department with 1 week of worsening weakness.  On arrival she was endorsing 2 weeks of progressively worsening profound weakness and shortness of breath. She been waking up in the middle the night and having to chew on ice. She is never had issues with anemia before however she has had heavy vaginal bleeding due to her fibroids. She states that she would use tampons every hour and would continue to pass large clots . She was found to have symptomatic anemia and admitted for the same. Gyn consulted.  Recommended outpatient follow up.    Assessment and Plan: Acute anemia of blood loss from menorrhagia:  S/p 2 units prbc transfusion, repeat H&h Is stable.  Gyn consulted , recommended outpatient follow up for fibroids.  She was started on Aygestin with improvement in bleeding.      GERD ON PPI.  Mylanta added.  Recommend outpatient follow up with gastroenterology for possible EGD.      Type 2 insulin dependent DM.  Resume home meds on discharge.      Body mass index is 50.48 kg/m. OBESITY Recommend outpatient follow up with PCP for weight loss.      Hypokalemia Replaced.      Consultants: gyn.  Procedures performed: NONE.   Disposition: Home Diet recommendation:  Discharge Diet Orders (From admission, onward)     Start     Ordered   09/03/22 0000  Diet - low sodium heart healthy         09/03/22 1357           Carb modified diet DISCHARGE MEDICATION: Allergies as of 09/03/2022       Reactions   Codeine Shortness Of Breath   Atorvastatin Other (See Comments)   DIFFUSE MYALGIA, RESOLVED AFTER STOPPING RX; SHE TRIED IT AGAIN BUT DEVELOPED DIFFUSE MYALGIA AND REFUSES A DIFFERENT STATIN TRIAL Other Reaction(s): Other (See Comments)   Iodinated Contrast Media Other (See Comments)   Kidney shutdown Other reaction(s): Other (See Comments) ACUTE TUBULAR NECROSIS AFTER IV CONTRAST ADMINISTRATION FOR CT Other Reaction(s): Other (See Comments)   Metrizamide Other (See Comments)   Kidney shutdown Kidney shutdown        Medication List     STOP taking these medications    dapagliflozin propanediol 5 MG Tabs tablet Commonly known as: Farxiga   diclofenac Sodium 1 % Gel Commonly known as: Voltaren   insulin glargine-yfgn 100 UNIT/ML injection Commonly known as: Semglee (yfgn)   meloxicam 15 MG tablet Commonly known as: MOBIC   omeprazole 20 MG tablet Commonly known as: PRILOSEC OTC Replaced by: omeprazole 20 MG capsule   oxyCODONE 5 MG immediate release tablet Commonly known as: Roxicodone       TAKE these medications    B-D UF III MINI PEN NEEDLES 31G X 5 MM Misc Generic drug: Insulin Pen Needle 1 Device by Does not apply route in the morning, at  noon, in the evening, and at bedtime.   Dexcom G6 Transmitter Misc 1 Device by Does not apply route as directed.   FreeStyle Libre 2 Sensor Misc 1 Device by Does not apply route every 14 (fourteen) days.   gabapentin 300 MG capsule Commonly known as: NEURONTIN Take 300 mg by mouth 2 (two) times daily.   insulin lispro 100 UNIT/ML KwikPen Commonly known as: HumaLOG KwikPen Max daily 70 units What changed:  how much to take how to take this when to take this reasons to take this   metFORMIN 500 MG tablet Commonly known as: GLUCOPHAGE Take 2 tablets (1,000 mg total) by mouth 2 (two) times  daily with a meal.   norethindrone 5 MG tablet Commonly known as: AYGESTIN Take 1 tablet (5 mg total) by mouth daily for 7 days. Start taking on: September 04, 2022   omeprazole 20 MG capsule Commonly known as: PRILOSEC Take 1 capsule (20 mg total) by mouth in the morning and at bedtime. Replaces: omeprazole 20 MG tablet   ondansetron 4 MG tablet Commonly known as: ZOFRAN Take 1 tablet (4 mg total) by mouth every 6 (six) hours as needed for nausea.   sertraline 50 MG tablet Commonly known as: ZOLOFT Take 150 mg by mouth daily.   Toujeo Max SoloStar 300 UNIT/ML Solostar Pen Generic drug: insulin glargine (2 Unit Dial) Inject 45 Units into the skin at bedtime. What changed: Another medication with the same name was removed. Continue taking this medication, and follow the directions you see here.   Ventolin HFA 108 (90 Base) MCG/ACT inhaler Generic drug: albuterol Inhale 2 puffs into the lungs every 6 (six) hours as needed for wheezing or shortness of breath.        Follow-up Information     Verlon Au, MD. Schedule an appointment as soon as possible for a visit in 1 week(s).   Specialty: Family Medicine Contact information: [address] [facility] [facility] [place] [address] [phone]         Meisinger, Tawanna Cooler, MD. Schedule an appointment as soon as possible for a visit in 1 week(s).   Specialty: Obstetrics and Gynecology Contact information: [address] [place] [address] [phone]                Discharge Exam: Ceasar Mons Weights   08/31/22 2122  Weight: (!) 146.2 kg   General exam: Appears calm and comfortable  Respiratory system: Clear to auscultation. Respiratory effort normal. Cardiovascular system: S1 & S2 heard, RRR. No JVD,  Gastrointestinal system: Abdomen is nondistended, soft and nontender.  Central nervous system: Alert and oriented. No focal neurological deficits. Extremities: Symmetric 5 x 5  power. Skin: No rashes, lesions or ulcers Psychiatry:  Mood & affect appropriate.    Condition at discharge: fair  The results of significant diagnostics from this hospitalization (including imaging, microbiology, ancillary and laboratory) are listed below for reference.   Imaging Studies: US Pelvis Complete  Result Date: 08/31/2022 CLINICAL DATA:  Vaginal bleeding for 1 month. EXAM: TRANSABDOMINAL ULTRASOUND OF PELVIS TECHNIQUE: Transabdominal ultrasound examination of the pelvis was performed including evaluation of the uterus, ovaries, adnexal regions, and pelvic cul-de-sac. COMPARISON:  CT pelvis 11/08/2016 FINDINGS: Uterus Measurements: 14.4 x 9.2 x 10.5 cm = volume: 733 mL. Submucosal masses likely representing fibroids measure 7.8 x 5.8 x 8.6 cm and 5.6 x 4.1 x 4.7 cm, respectively. Endometrium The endometrium is obscured by uterine masses. Right ovary Not visualized. Left  ovary Not visualized. Other findings:  No abnormal free fluid. IMPRESSION: 1. Enlarged uterus with submucosal masses likely representing fibroids. 2. The endometrium is obscured by submucosal fibroids. In the setting of post-menopausal bleeding, consider gynecology consult and/or sonohysterogram for focal lesion work-up. 3. Nonvisualization of the ovaries. Electronically Signed   By: Minerva Fester M.D.   On: 08/31/2022 23:28    Microbiology: Results for orders placed or performed during the hospital encounter of 12/18/21  Resp Panel by RT-PCR (Flu A&B, Covid) Anterior Nasal Swab     Status: None   Collection Time: 12/18/21 12:35 AM   Specimen: Anterior Nasal Swab  Result Value Ref Range Status   SARS Coronavirus 2 by RT PCR NEGATIVE NEGATIVE Final    Comment: (NOTE) SARS-CoV-2 target nucleic acids are NOT DETECTED.  The SARS-CoV-2 RNA is generally detectable in upper respiratory specimens during the acute phase of infection. The lowest concentration of SARS-CoV-2 viral copies this assay can detect is 138  copies/mL. A negative result does not preclude SARS-Cov-2 infection and should not be used as the sole basis for treatment or other patient management decisions. A negative result may occur with  improper specimen collection/handling, submission of specimen other than nasopharyngeal swab, presence of viral mutation(s) within the areas targeted by this assay, and inadequate number of viral copies(<138 copies/mL). A negative result must be combined with clinical observations, patient history, and epidemiological information. The expected result is Negative.  Fact Sheet for Patients:  BloggerCourse.com  Fact Sheet for Healthcare Providers:  SeriousBroker.it  This test is no t yet approved or cleared by the [place] FDA and  has been authorized for detection and/or diagnosis of SARS-CoV-2 by FDA under an Emergency Use Authorization (EUA). This EUA will remain  in effect (meaning this test can be used) for the duration of the COVID-19 declaration under Section 564(b)(1) of the Act, 21 U.S.C.section 360bbb-3(b)(1), unless the authorization is terminated  or revoked sooner.       Influenza A by PCR NEGATIVE NEGATIVE Final   Influenza B by PCR NEGATIVE NEGATIVE Final    Comment: (NOTE) The Xpert Xpress SARS-CoV-2/FLU/RSV plus assay is intended as an aid in the diagnosis of influenza from Nasopharyngeal swab specimens and should not be used as a sole basis for treatment. Nasal washings and aspirates are unacceptable for Xpert Xpress SARS-CoV-2/FLU/RSV testing.  Fact Sheet for Patients: BloggerCourse.com  Fact Sheet for Healthcare Providers: SeriousBroker.it  This test is not yet approved or cleared by the [place] FDA and has been authorized for detection and/or diagnosis of SARS-CoV-2 by FDA under an Emergency Use Authorization (EUA). This EUA will remain in effect (meaning  this test can be used) for the duration of the COVID-19 declaration under Section 564(b)(1) of the Act, 21 U.S.C. section 360bbb-3(b)(1), unless the authorization is terminated or revoked.  Performed at [facility], [address] [place], [place] [address]     Labs: CBC: Recent Labs  Lab 08/31/22 2133 09/01/22 0352 09/01/22 1305 09/02/22 0439 09/03/22 1112  WBC 6.9 5.9  --  6.2 5.6  NEUTROABS  --   --   --  4.1 3.7  HGB 6.8* 6.5* 8.2* 7.4* 8.9*  HCT 23.6* 22.3* 27.7* 24.7* 29.6*  MCV 70.2* 69.9*  --  72.9* 76.3*  PLT 264 222  --  252 219   Basic Metabolic Panel: Recent Labs  Lab 08/31/22 2133 09/01/22 0936 09/02/22 0439  NA 127* 136 135  K 3.4* 3.6 3.8  CL 94*  104 104  CO2 22 23 23   GLUCOSE 407* 101* 219*  BUN 17 12 17   CREATININE 0.96 0.65 0.72  CALCIUM 8.9 8.9 8.8*   Liver Function Tests: No results for input(s): "AST", "ALT", "ALKPHOS", "BILITOT", "PROT", "ALBUMIN" in the last 168 hours. CBG: Recent Labs  Lab 09/02/22 2006 09/02/22 2354 09/03/22 0350 09/03/22 0821 09/03/22 1125  GLUCAP 272* 134* 207* 179* 280*    Discharge time spent: 38 MINUTES.   Signed: Kathlen Mody, MD [facility] 09/03/2022

## 2022-09-03 NOTE — Progress Notes (Signed)
The patient is stable, no changes from am assessment. She is alert, oriented x 4, ambulatory independently. Discharge instructions reviewed. Questions, concerns denied at this time.

## 2022-09-03 NOTE — Progress Notes (Signed)
The patient complained of intermittent indigestion. Five minutes post Maalox administration the patient experienced an episode of emesis x 1.The pt was encouraged to sit upright especially after eating and drinking. Zofran administered for pt complaint of nausea. Provider updated.

## 2022-10-03 ENCOUNTER — Inpatient Hospital Stay: Payer: PRIVATE HEALTH INSURANCE | Attending: Hematology and Oncology | Admitting: Hematology and Oncology

## 2022-10-03 ENCOUNTER — Inpatient Hospital Stay: Payer: PRIVATE HEALTH INSURANCE

## 2022-11-16 ENCOUNTER — Emergency Department (HOSPITAL_BASED_OUTPATIENT_CLINIC_OR_DEPARTMENT_OTHER)
Admission: EM | Admit: 2022-11-16 | Discharge: 2022-11-16 | Disposition: A | Discharge status: Home / Self Care | Payer: PRIVATE HEALTH INSURANCE | Attending: Emergency Medicine | Admitting: Emergency Medicine

## 2022-11-16 ENCOUNTER — Other Ambulatory Visit: Payer: Self-pay

## 2022-11-16 ENCOUNTER — Encounter (HOSPITAL_BASED_OUTPATIENT_CLINIC_OR_DEPARTMENT_OTHER): Payer: Self-pay

## 2022-11-16 DIAGNOSIS — M25571 Pain in right ankle and joints of right foot: Secondary | ICD-10-CM | POA: Diagnosis not present

## 2022-11-16 DIAGNOSIS — S86011A Strain of right Achilles tendon, initial encounter: Secondary | ICD-10-CM | POA: Diagnosis not present

## 2022-11-16 DIAGNOSIS — S8991XA Unspecified injury of right lower leg, initial encounter: Secondary | ICD-10-CM | POA: Diagnosis present

## 2022-11-16 DIAGNOSIS — X501XXA Overexertion from prolonged static or awkward postures, initial encounter: Secondary | ICD-10-CM | POA: Diagnosis not present

## 2022-11-16 DIAGNOSIS — Z794 Long term (current) use of insulin: Secondary | ICD-10-CM | POA: Insufficient documentation

## 2022-11-16 NOTE — Discharge Instructions (Addendum)
You are seen in the emergency department today for ankle pain.  I believe that you likely have sustained at least a partial tear of the Achilles tendon in your right ankle.  Given your findings of a positive Thompson test, I am concerned about a possible complete rupture of the Achilles tendon.  I would recommend following up with orthopedics for repeat evaluation.  You may reach out to Ortho care here at [facility] for further evaluation.  You may also reach out to the orthopedist that your primary care provider as previously referred you to.  If you have any worsening in symptoms, return the emergency department.  In the meantime, you are placed into a cam boot for additional support and immobilization.  I would recommend avoiding weightbearing as this is likely to cause significant pain and worsen your condition.

## 2022-11-16 NOTE — ED Triage Notes (Signed)
She c/o right achilles tendon area pain for just over a week which was made worse upon "stepped up onto a curb and felt a "pop" yesterday. She is ambulatory with a limp.

## 2022-11-16 NOTE — ED Provider Notes (Signed)
[facility] EMERGENCY DEPARTMENT AT [facility] Provider Note   CSN: [id number] Arrival date & time: 11/16/22  1553     History Chief Complaint  Patient presents with   Right achilles area pain    [name] is a 52 y.o. female.  Patient presents to the emergency department concerns of right Achilles pain.  Reports this this has been ongoing pain for the last several weeks but noted worsening pain and a feeling of a pop yesterday.  Unable to bear full weight on right leg since this.  No prior history of blood clots and not currently on blood thinners.  No recent surgery or immobilization.  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  Denies any recent falls or rolling of the ankle.  HPI     Home Medications Prior to Admission medications   Medication Sig Start Date End Date Taking? Authorizing Provider  albuterol (VENTOLIN HFA) 108 (90 Base) MCG/ACT inhaler Inhale 2 puffs into the lungs every 6 (six) hours as needed for wheezing or shortness of breath.  06/15/15 08/31/22  [provider]  Continuous Blood Gluc Sensor (FREESTYLE LIBRE 2 SENSOR) MISC 1 Device by Does not apply route every 14 (fourteen) days. 04/17/21   Shamleffer, Konrad Dolores, MD  Continuous Blood Gluc Transmit (DEXCOM G6 TRANSMITTER) MISC 1 Device by Does not apply route as directed. Patient not taking: Reported on 04/17/2021 04/06/20   Shamleffer, Konrad Dolores, MD  gabapentin (NEURONTIN) 300 MG capsule Take 300 mg by mouth 2 (two) times daily. 01/21/21   [provider]  insulin glargine, 2 Unit Dial, (TOUJEO MAX SOLOSTAR) 300 UNIT/ML Solostar Pen Inject 45 Units into the skin at bedtime.    [provider]  insulin lispro (HUMALOG KWIKPEN) 100 UNIT/ML KwikPen Max daily 70 units Patient taking differently: Inject 12 Units into the skin 3 (three) times daily as needed  (high blood sugar). Max daily 70 units 04/17/21   Shamleffer, Konrad Dolores, MD  Insulin Pen Needle (B-D UF III MINI PEN NEEDLES) 31G X 5 MM MISC 1 Device by Does not apply route in the morning, at noon, in the evening, and at bedtime. 04/17/21   Shamleffer, Konrad Dolores, MD  metFORMIN (GLUCOPHAGE) 500 MG tablet Take 2 tablets (1,000 mg total) by mouth 2 (two) times daily with a meal. 04/17/21   Shamleffer, Konrad Dolores, MD  norethindrone (AYGESTIN) 5 MG tablet Take 1 tablet (5 mg total) by mouth daily for 7 days. 09/04/22 09/11/22  Kathlen Mody, MD  omeprazole (PRILOSEC) 20 MG capsule Take 1 capsule (20 mg total) by mouth in the morning and at bedtime. 09/03/22   Kathlen Mody, MD  ondansetron (ZOFRAN) 4 MG tablet Take 1 tablet (4 mg total) by mouth every 6 (six) hours as needed for nausea. 09/03/22   Kathlen Mody, MD  sertraline (ZOLOFT) 50 MG tablet Take 150 mg by mouth daily.    [provider]      Allergies    Codeine, Atorvastatin, Iodinated contrast media, and Metrizamide    Review of Systems   Review of Systems  Musculoskeletal:  Positive for joint swelling.  All other systems reviewed and are negative.   Physical Exam Updated Vital Signs BP (!) 175/70 (BP Location: Left Arm)   Pulse 77   Temp 98.4 F (36.9 C) (Oral)   Resp 19   LMP 06/16/2019  SpO2 100%  Physical Exam Vitals and nursing note reviewed.  Constitutional:      General: She is not in acute distress.    Appearance: She is well-developed.  HENT:     Head: Normocephalic and atraumatic.  Eyes:     Conjunctiva/sclera: Conjunctivae normal.  Cardiovascular:     Rate and Rhythm: Normal rate and regular rhythm.     Heart sounds: No murmur heard. Pulmonary:     Effort: Pulmonary effort is normal. No respiratory distress.     Breath sounds: Normal breath sounds.  Abdominal:     Palpations: Abdomen is soft.     Tenderness: There is no abdominal tenderness.  Musculoskeletal:        General:  Tenderness, deformity and signs of injury present. No swelling. Normal range of motion.     Cervical back: Neck supple.       Legs:     Comments: TTP along the achilles tendon of the right leg. Swelling around the insertion site at the posterior calcaneous. Positive Thompson test with absence of plantar flexion with squeezing of calf.   Skin:    General: Skin is warm and dry.     Capillary Refill: Capillary refill takes less than 2 seconds.  Neurological:     Mental Status: She is alert.  Psychiatric:        Mood and Affect: Mood normal.     ED Results / Procedures / Treatments   Labs (all labs ordered are listed, but only abnormal results are displayed) Labs Reviewed - No data to display  EKG None  Radiology No results found.  Procedures Procedures   Medications Ordered in ED Medications - No data to display  ED Course/ Medical Decision Making/ A&P                               Medical Decision Making  This patient presents to the ED for concern of achilles tendon pain. Differential diagnosis includes achilles tendon rupture, DVT, ankle sprain, arthritis   Problem List / ED Course:  Patient presents the emergency department concerns of Achilles tendon pain.  Reports that the pain is present in the right ankle does not present for several weeks and worsening.  States that yesterday she felt a pop and worsening in pain and difficulty to move ankle afterwards.  Denies falling or rolling ankle.  On initial physical exam, there are some swelling noted to the right ankle notably worse when compared to the left ankle. Clinically, patient has a partial/complete Achilles tendon rupture of the right leg given positive Thompson test.  Given that there is still feels to be Achilles tendon present on palpation, suspect likely partial rupture instead of complete rupture.  Advised patient that need for outpatient orthopedic follow-up is advised given current symptoms.  Patient is  agreeable with this plan and will plan on following up with orthopedist.  States that she is planning on seeing an orthopedist that she has previously been referred to 1 by her primary care provider for osteoarthritis of the knee.  Will plan on reaching out to the same group for further follow-up.  Provided patient with contact info for local orthopedics for that patient can also reach out to you for further evaluation.  Discussed strict return precautions with patient.  Patient agreeable with current treatment plan.  All questions answered prior to patient discharge.  Patient discharged home in stable condition.  Final  Clinical Impression(s) / ED Diagnoses Final diagnoses:  Partial rupture of Achilles tendon, right, initial encounter    Rx / DC Orders ED Discharge Orders     None         Smitty Knudsen, PA-C 11/16/22 1846    Franne Forts, DO 11/19/22 1330

## 2023-08-01 ENCOUNTER — Encounter (HOSPITAL_BASED_OUTPATIENT_CLINIC_OR_DEPARTMENT_OTHER): Payer: Self-pay | Admitting: Emergency Medicine

## 2023-08-01 DIAGNOSIS — M5441 Lumbago with sciatica, right side: Secondary | ICD-10-CM | POA: Diagnosis not present

## 2023-08-01 DIAGNOSIS — M545 Low back pain, unspecified: Secondary | ICD-10-CM | POA: Diagnosis present

## 2023-08-01 LAB — CBC WITH DIFFERENTIAL/PLATELET
Abs Immature Granulocytes: 0.02 10*3/uL (ref 0.00–0.07)
Basophils Absolute: 0 10*3/uL (ref 0.0–0.1)
Basophils Relative: 0 %
Eosinophils Absolute: 0.1 10*3/uL (ref 0.0–0.5)
Eosinophils Relative: 1 %
HCT: 29.5 % — ABNORMAL LOW (ref 36.0–46.0)
Hemoglobin: 10.2 g/dL — ABNORMAL LOW (ref 12.0–15.0)
Immature Granulocytes: 0 %
Lymphocytes Relative: 23 %
Lymphs Abs: 1.6 10*3/uL (ref 0.7–4.0)
MCH: 28.9 pg (ref 26.0–34.0)
MCHC: 34.6 g/dL (ref 30.0–36.0)
MCV: 83.6 fL (ref 80.0–100.0)
Monocytes Absolute: 0.5 10*3/uL (ref 0.1–1.0)
Monocytes Relative: 7 %
Neutro Abs: 4.7 10*3/uL (ref 1.7–7.7)
Neutrophils Relative %: 69 %
Platelets: 230 10*3/uL (ref 150–400)
RBC: 3.53 MIL/uL — ABNORMAL LOW (ref 3.87–5.11)
RDW: 13.1 % (ref 11.5–15.5)
WBC: 6.8 10*3/uL (ref 4.0–10.5)
nRBC: 0 % (ref 0.0–0.2)

## 2023-08-01 LAB — COMPREHENSIVE METABOLIC PANEL WITH GFR
ALT: 12 U/L (ref 0–44)
AST: 17 U/L (ref 15–41)
Albumin: 3.6 g/dL (ref 3.5–5.0)
Alkaline Phosphatase: 157 U/L — ABNORMAL HIGH (ref 38–126)
Anion gap: 13 (ref 5–15)
BUN: 14 mg/dL (ref 6–20)
CO2: 23 mmol/L (ref 22–32)
Calcium: 9.7 mg/dL (ref 8.9–10.3)
Chloride: 103 mmol/L (ref 98–111)
Creatinine, Ser: 0.83 mg/dL (ref 0.44–1.00)
GFR, Estimated: >60 mL/min (ref >60)
Glucose, Bld: 169 mg/dL — ABNORMAL HIGH (ref 70–99)
Potassium: 3 mmol/L — ABNORMAL LOW (ref 3.5–5.1)
Sodium: 139 mmol/L (ref 135–145)
Total Bilirubin: 0.6 mg/dL (ref 0.0–1.2)
Total Protein: 8.6 g/dL — ABNORMAL HIGH (ref 6.5–8.1)

## 2023-08-01 MED ORDER — IBUPROFEN 400 MG PO TABS
400.0000 mg | ORAL_TABLET | Freq: Once | ORAL | Status: AC
  Administered 2023-08-01: 400 mg via ORAL
  Filled 2023-08-01: qty 1

## 2023-08-01 MED ORDER — ACETAMINOPHEN 325 MG PO TABS
650.0000 mg | ORAL_TABLET | Freq: Once | ORAL | Status: AC
  Administered 2023-08-01: 650 mg via ORAL
  Filled 2023-08-01: qty 2

## 2023-08-01 NOTE — ED Triage Notes (Signed)
 Pt coming in from home with posterior right leg pain that's been going on for 3 days. Pt denies any injuries

## 2023-08-02 ENCOUNTER — Emergency Department (HOSPITAL_BASED_OUTPATIENT_CLINIC_OR_DEPARTMENT_OTHER)
Admission: EM | Admit: 2023-08-02 | Discharge: 2023-08-02 | Disposition: A | Discharge status: Home / Self Care | Payer: PRIVATE HEALTH INSURANCE | Attending: Emergency Medicine | Admitting: Emergency Medicine

## 2023-08-02 DIAGNOSIS — M5441 Lumbago with sciatica, right side: Secondary | ICD-10-CM | POA: Diagnosis not present

## 2023-08-02 DIAGNOSIS — M5431 Sciatica, right side: Secondary | ICD-10-CM

## 2023-08-02 MED ORDER — HYDROCODONE-ACETAMINOPHEN 5-325 MG PO TABS: 1.0000 | ORAL_TABLET | ORAL | 0 refills | Status: AC | PRN

## 2023-08-02 MED ORDER — KETOROLAC TROMETHAMINE 60 MG/2ML IM SOLN
60.0000 mg | Freq: Once | INTRAMUSCULAR | Status: AC
  Administered 2023-08-02: 60 mg via INTRAMUSCULAR
  Filled 2023-08-02: qty 2

## 2023-08-02 MED ORDER — IBUPROFEN 600 MG PO TABS: 600.0000 mg | ORAL_TABLET | Freq: Four times a day (QID) | ORAL | 0 refills | Status: AC | PRN

## 2023-08-02 MED ORDER — METHOCARBAMOL 500 MG PO TABS: 500.0000 mg | ORAL_TABLET | Freq: Three times a day (TID) | ORAL | 0 refills | Status: DC | PRN

## 2023-08-02 NOTE — ED Provider Notes (Signed)
 [place] [facility] AT [facility] Provider Note   CSN: [id number] Arrival date & time: 08/01/23  2143     Patient presents with: Leg Pain   [name] is a 53 y.o. female.   Patient presents to the emergency department for evaluation of back and leg pain.  Patient reports that she has been experiencing pain on the right side of her lower back radiating down her buttock to the back of her thigh for 3 days.  No known injury.  She reports that she had a similar pain years ago and was told it was sciatica.       Prior to Admission medications   Medication Sig Start Date End Date Taking? Authorizing Provider  HYDROcodone -acetaminophen  (NORCO/VICODIN) 5-325 MG tablet Take 1 tablet by mouth every 4 (four) hours as needed for moderate pain (pain score 4-6). 08/02/23  Yes Agripina Guyette, Lonni PARAS, MD  ibuprofen  (ADVIL ) 600 MG tablet Take 1 tablet (600 mg total) by mouth every 6 (six) hours as needed. 08/02/23  Yes Nela Bascom, Lonni PARAS, MD  methocarbamol  (ROBAXIN ) 500 MG tablet Take 1 tablet (500 mg total) by mouth every 8 (eight) hours as needed for muscle spasms. 08/02/23  Yes Lyndall Bellot, Lonni PARAS, MD  albuterol  (VENTOLIN  HFA) 108 (90 Base) MCG/ACT inhaler Inhale 2 puffs into the lungs every 6 (six) hours as needed for wheezing or shortness of breath.  06/15/15 08/31/22  [provider]  Continuous Blood Gluc Sensor (FREESTYLE LIBRE 2 SENSOR) MISC 1 Device by Does not apply route every 14 (fourteen) days. 04/17/21   Shamleffer, Ibtehal Jaralla, MD  Continuous Blood Gluc Transmit (DEXCOM G6 TRANSMITTER) MISC 1 Device by Does not apply route as directed. Patient not taking: Reported on 04/17/2021 04/06/20   Shamleffer, Ibtehal Jaralla, MD  gabapentin (NEURONTIN) 300 MG capsule Take 300 mg by mouth 2 (two) times daily. 01/21/21   [provider]  insulin  glargine, 2 Unit Dial , (TOUJEO  MAX SOLOSTAR) 300 UNIT/ML Solostar Pen Inject 45 Units into the skin at  bedtime.    [provider]  insulin  lispro (HUMALOG  KWIKPEN) 100 UNIT/ML KwikPen Max daily 70 units Patient taking differently: Inject 12 Units into the skin 3 (three) times daily as needed (high blood sugar). Max daily 70 units 04/17/21   Shamleffer, Donell Cardinal, MD  Insulin  Pen Needle (B-D UF III MINI PEN NEEDLES) 31G X 5 MM MISC 1 Device by Does not apply route in the morning, at noon, in the evening, and at bedtime. 04/17/21   Shamleffer, Ibtehal Jaralla, MD  metFORMIN  (GLUCOPHAGE ) 500 MG tablet Take 2 tablets (1,000 mg total) by mouth 2 (two) times daily with a meal. 04/17/21   Shamleffer, Donell Cardinal, MD  norethindrone  (AYGESTIN ) 5 MG tablet Take 1 tablet (5 mg total) by mouth daily for 7 days. 09/04/22 09/11/22  Cherlyn Labella, MD  omeprazole  (PRILOSEC) 20 MG capsule Take 1 capsule (20 mg total) by mouth in the morning and at bedtime. 09/03/22   Akula, Vijaya, MD  ondansetron  (ZOFRAN ) 4 MG tablet Take 1 tablet (4 mg total) by mouth every 6 (six) hours as needed for nausea. 09/03/22   Akula, Vijaya, MD  sertraline  (ZOLOFT ) 50 MG tablet Take 150 mg by mouth daily.    [provider]    Allergies: Codeine, Atorvastatin, Iodinated contrast media, and Metrizamide    Review of Systems  Updated Vital Signs BP 139/86   Pulse 88   Temp 98.7 F (37.1 C) (Oral)   Ht 5' 7 (  1.702 m)   Wt (!) 142.9 kg   LMP 06/16/2019   SpO2 99%   BMI 49.34 kg/m   Physical Exam Vitals and nursing note reviewed.  Constitutional:      General: She is not in acute distress.    Appearance: She is well-developed.  HENT:     Head: Normocephalic and atraumatic.     Mouth/Throat:     Mouth: Mucous membranes are moist.   Eyes:     General: Vision grossly intact. Gaze aligned appropriately.     Extraocular Movements: Extraocular movements intact.     Conjunctiva/sclera: Conjunctivae normal.    Cardiovascular:     Rate and Rhythm: Normal rate and regular rhythm.     Pulses: Normal  pulses.     Heart sounds: Normal heart sounds, S1 normal and S2 normal. No murmur heard.    No friction rub. No gallop.  Pulmonary:     Effort: Pulmonary effort is normal. No respiratory distress.     Breath sounds: Normal breath sounds.  Abdominal:     General: Bowel sounds are normal.     Palpations: Abdomen is soft.     Tenderness: There is no abdominal tenderness. There is no guarding or rebound.     Hernia: No hernia is present.   Musculoskeletal:        General: No swelling.     Cervical back: Full passive range of motion without pain, normal range of motion and neck supple. No spinous process tenderness or muscular tenderness. Normal range of motion.     Right lower leg: No edema.     Left lower leg: No edema.   Skin:    General: Skin is warm and dry.     Capillary Refill: Capillary refill takes less than 2 seconds.     Findings: No ecchymosis, erythema, rash or wound.   Neurological:     General: No focal deficit present.     Mental Status: She is alert and oriented to person, place, and time.     GCS: GCS eye subscore is 4. GCS verbal subscore is 5. GCS motor subscore is 6.     Cranial Nerves: Cranial nerves 2-12 are intact.     Sensory: Sensation is intact.     Motor: Motor function is intact.     Coordination: Coordination is intact.   Psychiatric:        Attention and Perception: Attention normal.        Mood and Affect: Mood normal.        Speech: Speech normal.        Behavior: Behavior normal.     (all labs ordered are listed, but only abnormal results are displayed) Labs Reviewed  CBC WITH DIFFERENTIAL/PLATELET - Abnormal; Notable for the following components:      Result Value   RBC 3.53 (*)    Hemoglobin 10.2 (*)    HCT 29.5 (*)    All other components within normal limits  COMPREHENSIVE METABOLIC PANEL WITH GFR - Abnormal; Notable for the following components:   Potassium 3.0 (*)    Glucose, Bld 169 (*)    Total Protein 8.6 (*)    Alkaline  Phosphatase 157 (*)    All other components within normal limits    EKG: None  Radiology: No results found.   Procedures   Medications Ordered in the ED  ketorolac  (TORADOL ) injection 60 mg (has no administration in time range)  acetaminophen  (TYLENOL ) tablet 650 mg (650 mg  Oral Given 08/01/23 2220)  ibuprofen  (ADVIL ) tablet 400 mg (400 mg Oral Given 08/01/23 2220)                                    Medical Decision Making Amount and/or Complexity of Data Reviewed Labs: ordered.  Risk OTC drugs. Prescription drug management.   Differential Diagnosis considered includes, but not limited to: Muscle strain; acute disc herniation; lumbar fracture; cauda equina syndrome; infection; malignancy  Patient presents to the ER with musculoskeletal back pain. Examination reveals back tenderness without any associated neurologic findings. Patient's strength, sensation and reflexes were normal. There is no evidence of saddle anesthesia. Patient does not have a foot drop. Patient has not experienced any change in bowel or bladder function. As such, patient did not require any imaging or further studies. Patient was treated with analgesia.     Final diagnoses:  Sciatica of right side    ED Discharge Orders          Ordered    HYDROcodone -acetaminophen  (NORCO/VICODIN) 5-325 MG tablet  Every 4 hours PRN        08/02/23 0211    ibuprofen  (ADVIL ) 600 MG tablet  Every 6 hours PRN        08/02/23 0211    methocarbamol  (ROBAXIN ) 500 MG tablet  Every 8 hours PRN        08/02/23 0211               Haze Lonni PARAS, MD 08/02/23 [phone]

## 2023-11-21 IMAGING — DX DG WRIST COMPLETE 3+V*R*
4 series · 4 of 4 positions shown · non-contrast
Comparison: None.

CLINICAL DATA: Wrist pain for 2 weeks, no known injury, initial
encounter

EXAM:
RIGHT WRIST - COMPLETE 3+ VIEW

[wrist ap (1 of 2)]
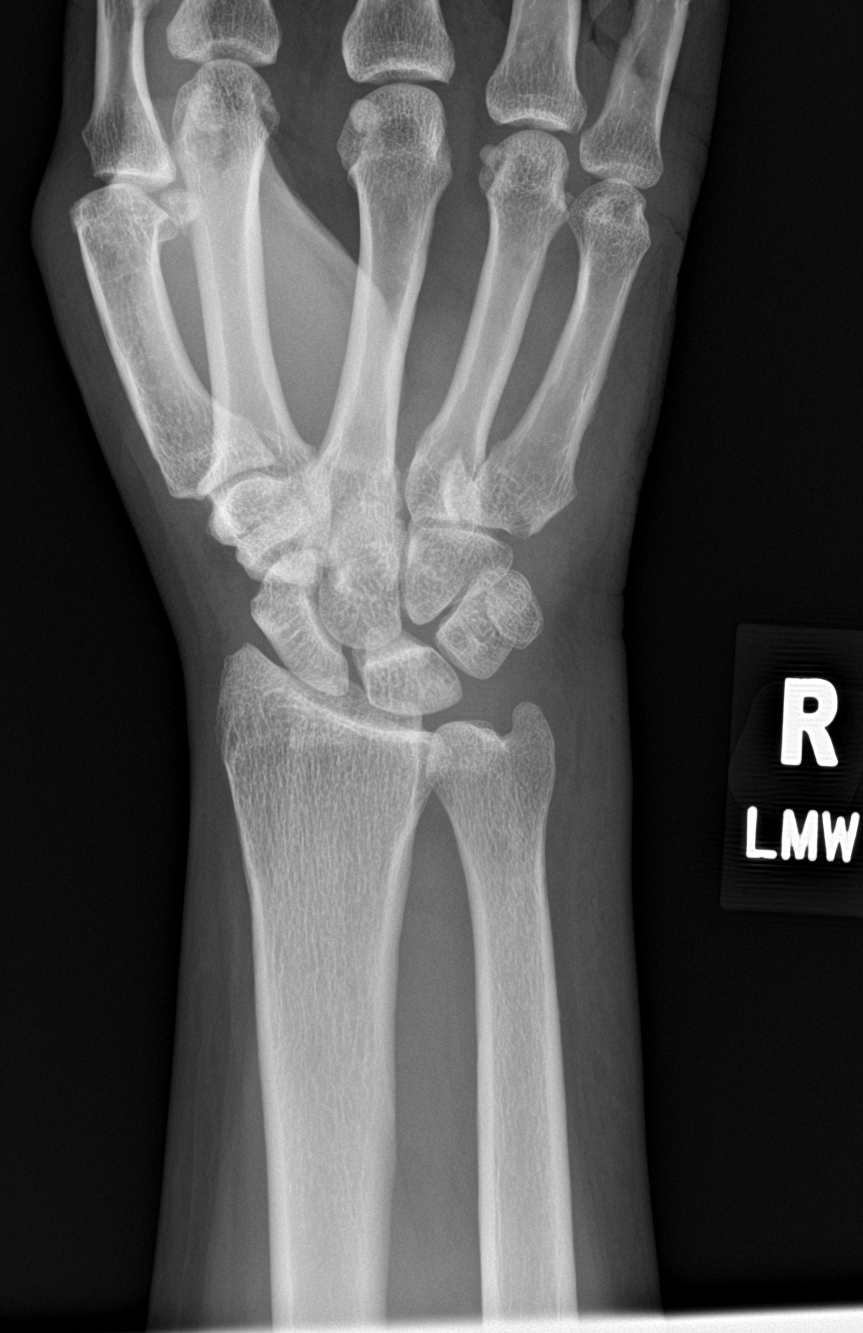

[wrist obl]
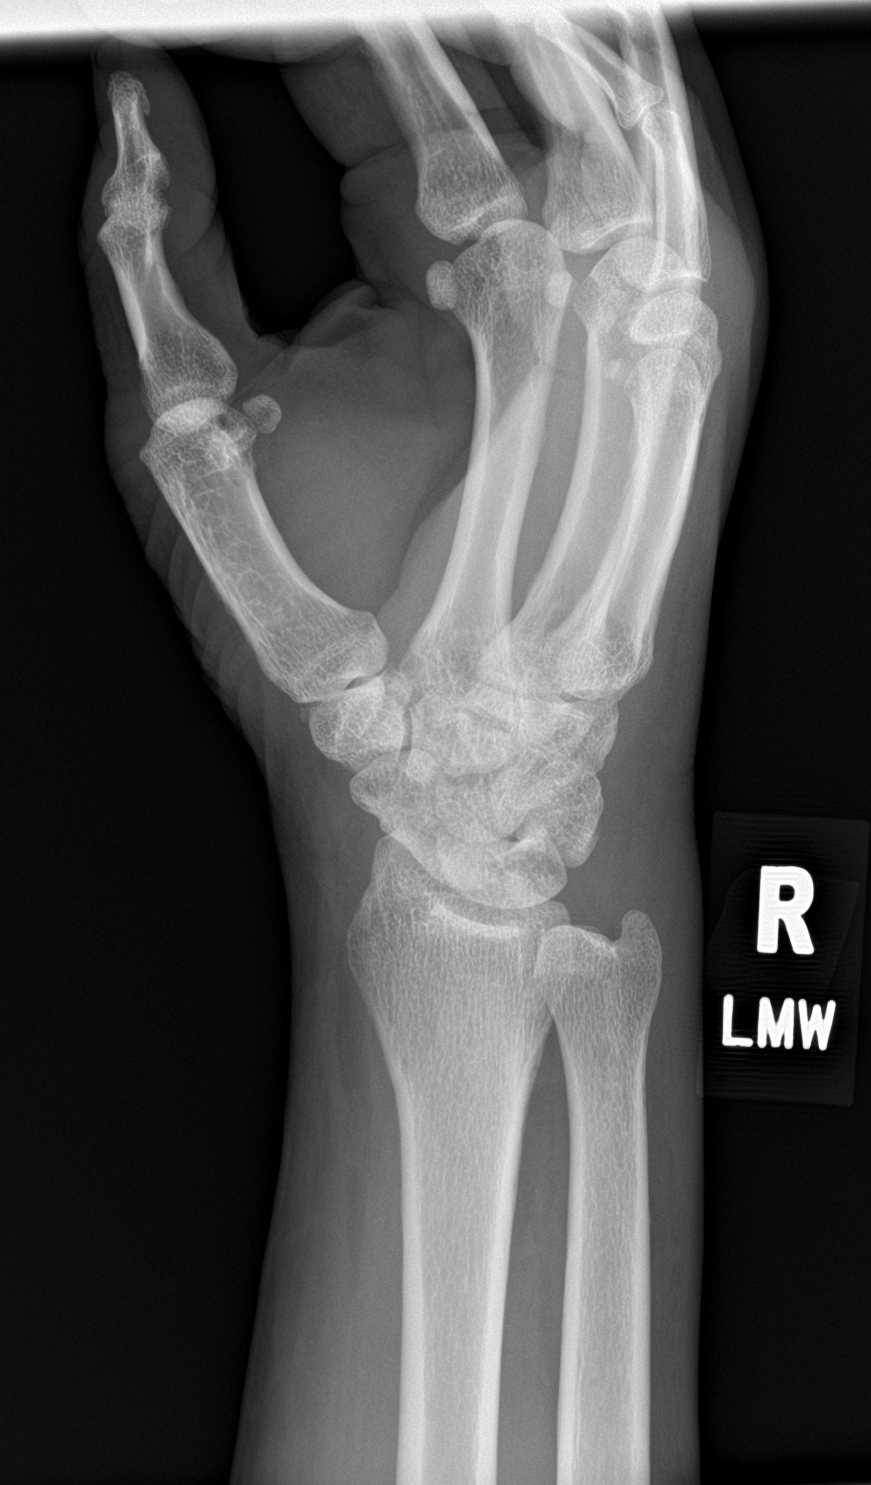

[wrist lat]
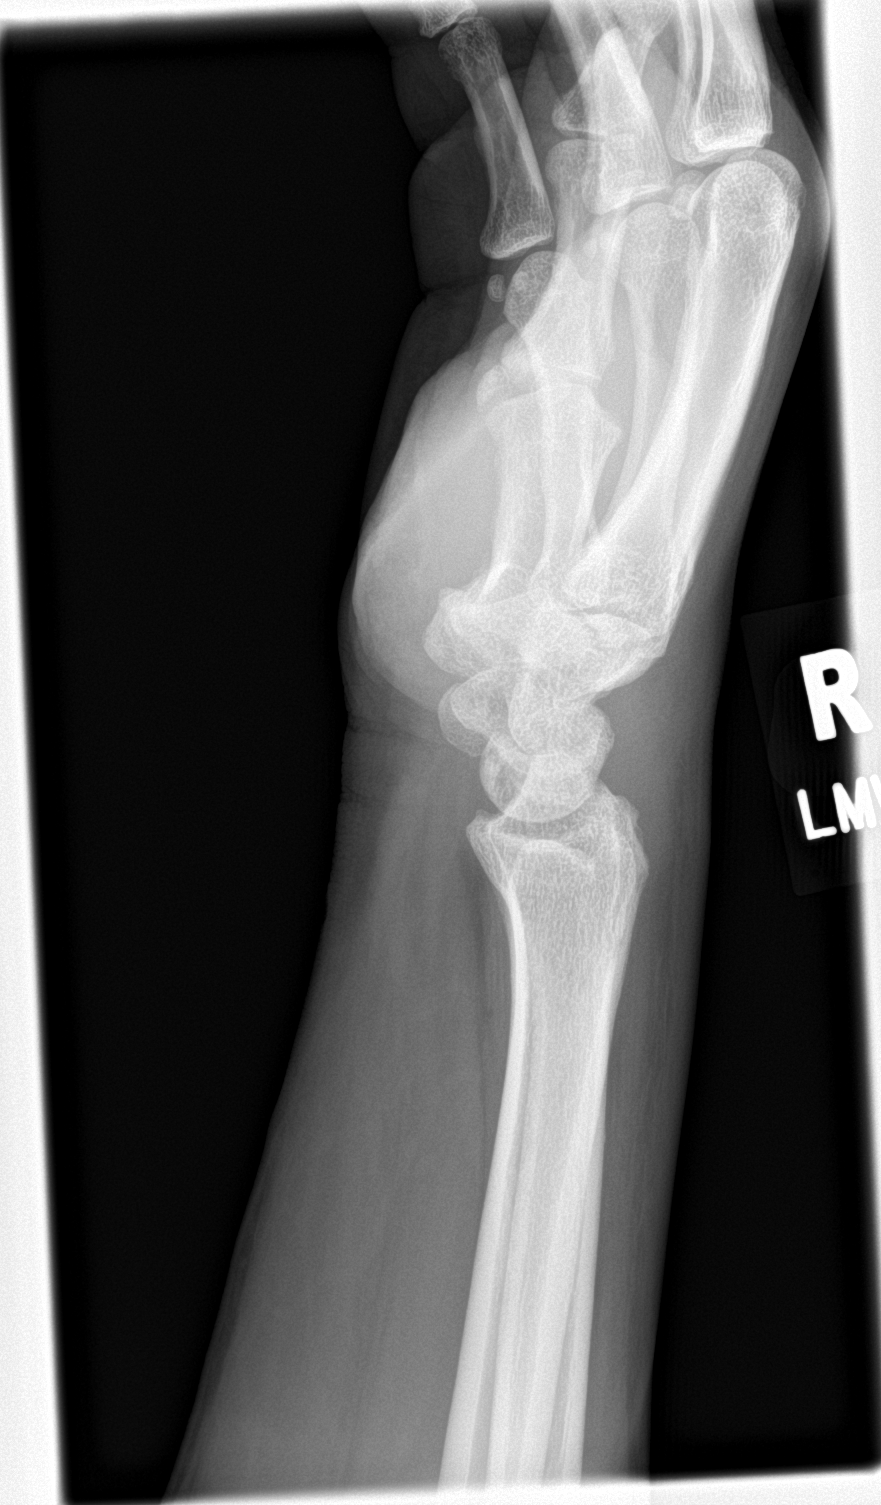

[wrist ap (2 of 2)]
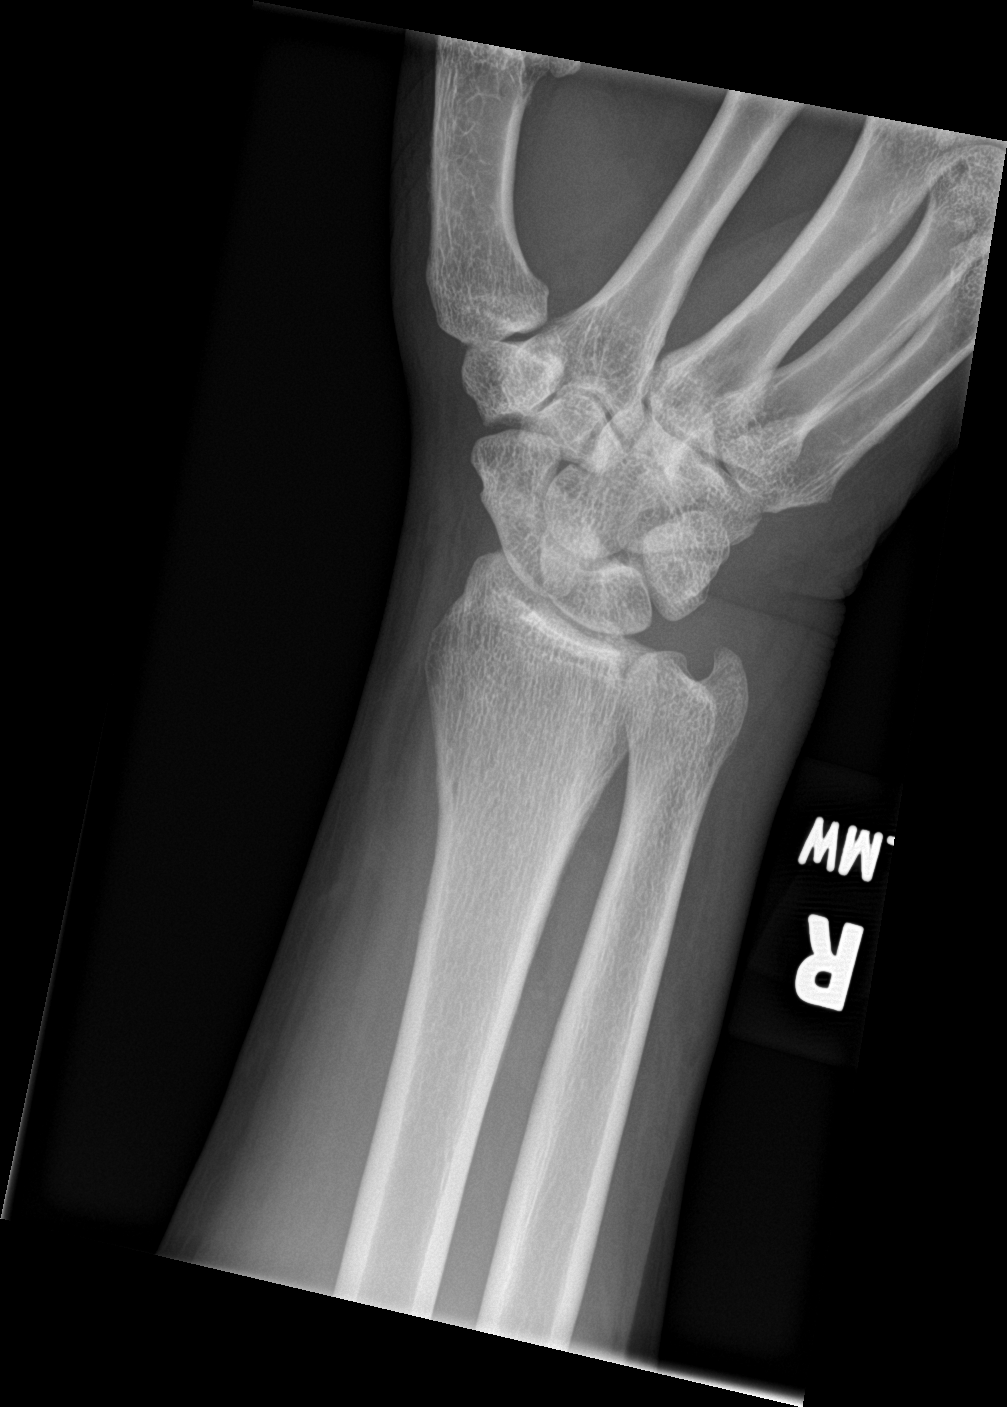

[4 of 4 positions shown; findings below may reference images not displayed]

FINDINGS: There is no evidence of fracture or dislocation. There is no
evidence of arthropathy or other focal bone abnormality. Soft
tissues are unremarkable.
IMPRESSION: No acute abnormality noted.

## 2023-12-09 ENCOUNTER — Emergency Department (HOSPITAL_COMMUNITY)
Admission: EM | Admit: 2023-12-09 | Discharge: 2023-12-09 | Disposition: A | Discharge status: Home / Self Care | Payer: PRIVATE HEALTH INSURANCE | Attending: Emergency Medicine | Admitting: Emergency Medicine

## 2023-12-09 ENCOUNTER — Encounter (HOSPITAL_COMMUNITY): Payer: Self-pay

## 2023-12-09 ENCOUNTER — Emergency Department (HOSPITAL_COMMUNITY): Payer: PRIVATE HEALTH INSURANCE

## 2023-12-09 ENCOUNTER — Other Ambulatory Visit: Payer: Self-pay

## 2023-12-09 DIAGNOSIS — Z794 Long term (current) use of insulin: Secondary | ICD-10-CM | POA: Diagnosis not present

## 2023-12-09 DIAGNOSIS — J189 Pneumonia, unspecified organism: Secondary | ICD-10-CM

## 2023-12-09 DIAGNOSIS — R0602 Shortness of breath: Secondary | ICD-10-CM | POA: Diagnosis present

## 2023-12-09 DIAGNOSIS — E119 Type 2 diabetes mellitus without complications: Secondary | ICD-10-CM | POA: Diagnosis not present

## 2023-12-09 DIAGNOSIS — Z7984 Long term (current) use of oral hypoglycemic drugs: Secondary | ICD-10-CM | POA: Diagnosis not present

## 2023-12-09 DIAGNOSIS — R10A0 Flank pain, unspecified side: Secondary | ICD-10-CM | POA: Diagnosis not present

## 2023-12-09 LAB — CBC WITH DIFFERENTIAL/PLATELET
Abs Immature Granulocytes: 0.02 K/uL (ref 0.00–0.07)
Basophils Absolute: 0 K/uL (ref 0.0–0.1)
Basophils Relative: 0 %
Eosinophils Absolute: 0.1 K/uL (ref 0.0–0.5)
Eosinophils Relative: 2 %
HCT: 34.7 % — ABNORMAL LOW (ref 36.0–46.0)
Hemoglobin: 11.2 g/dL — ABNORMAL LOW (ref 12.0–15.0)
Immature Granulocytes: 0 %
Lymphocytes Relative: 28 %
Lymphs Abs: 1.6 K/uL (ref 0.7–4.0)
MCH: 28.1 pg (ref 26.0–34.0)
MCHC: 32.3 g/dL (ref 30.0–36.0)
MCV: 87 fL (ref 80.0–100.0)
Monocytes Absolute: 0.5 K/uL (ref 0.1–1.0)
Monocytes Relative: 8 %
Neutro Abs: 3.4 K/uL (ref 1.7–7.7)
Neutrophils Relative %: 62 %
Platelets: 250 K/uL (ref 150–400)
RBC: 3.99 MIL/uL (ref 3.87–5.11)
RDW: 13.2 % (ref 11.5–15.5)
WBC: 5.6 K/uL (ref 4.0–10.5)
nRBC: 0 % (ref 0.0–0.2)

## 2023-12-09 LAB — URINALYSIS, ROUTINE W REFLEX MICROSCOPIC
Bilirubin Urine: NEGATIVE
Glucose, UA: 150 mg/dL — AB
Hgb urine dipstick: NEGATIVE
Ketones, ur: NEGATIVE mg/dL
Leukocytes,Ua: NEGATIVE
Nitrite: NEGATIVE
Protein, ur: 30 mg/dL — AB
Specific Gravity, Urine: 1.023 (ref 1.005–1.030)
pH: 6 (ref 5.0–8.0)

## 2023-12-09 LAB — COMPREHENSIVE METABOLIC PANEL WITH GFR
ALT: 16 U/L (ref 0–44)
AST: 17 U/L (ref 15–41)
Albumin: 3.8 g/dL (ref 3.5–5.0)
Alkaline Phosphatase: 177 U/L — ABNORMAL HIGH (ref 38–126)
Anion gap: 10 (ref 5–15)
BUN: 16 mg/dL (ref 6–20)
CO2: 24 mmol/L (ref 22–32)
Calcium: 10.2 mg/dL (ref 8.9–10.3)
Chloride: 102 mmol/L (ref 98–111)
Creatinine, Ser: 0.78 mg/dL (ref 0.44–1.00)
GFR, Estimated: >60 mL/min
Glucose, Bld: 175 mg/dL — ABNORMAL HIGH (ref 70–99)
Potassium: 4.1 mmol/L (ref 3.5–5.1)
Sodium: 136 mmol/L (ref 135–145)
Total Bilirubin: 0.6 mg/dL (ref 0.0–1.2)
Total Protein: 9.6 g/dL — ABNORMAL HIGH (ref 6.5–8.1)

## 2023-12-09 LAB — PRO BRAIN NATRIURETIC PEPTIDE: Pro Brain Natriuretic Peptide: <50 pg/mL (ref <300.0)

## 2023-12-09 LAB — LIPASE, BLOOD: Lipase: 29 U/L (ref 11–51)

## 2023-12-09 LAB — TROPONIN T, HIGH SENSITIVITY: Troponin T High Sensitivity: <15 ng/L (ref 0–19)

## 2023-12-09 LAB — D-DIMER, QUANTITATIVE: D-Dimer, Quant: 1.08 ug{FEU}/mL — ABNORMAL HIGH (ref 0.00–0.50)

## 2023-12-09 MED ORDER — METHOCARBAMOL 500 MG PO TABS
500.0000 mg | ORAL_TABLET | Freq: Two times a day (BID) | ORAL | 0 refills | Status: AC
Start: 2023-12-09 — End: ?

## 2023-12-09 MED ORDER — DOXYCYCLINE HYCLATE 100 MG PO CAPS
100.0000 mg | ORAL_CAPSULE | Freq: Two times a day (BID) | ORAL | 0 refills | Status: AC
Start: 2023-12-09 — End: ?

## 2023-12-09 MED ORDER — ACETAMINOPHEN 325 MG PO TABS
650.0000 mg | ORAL_TABLET | Freq: Once | ORAL | Status: AC
  Administered 2023-12-09: 650 mg via ORAL
  Filled 2023-12-09: qty 2

## 2023-12-09 MED ORDER — METHOCARBAMOL 500 MG PO TABS
500.0000 mg | ORAL_TABLET | Freq: Once | ORAL | Status: AC
  Administered 2023-12-09: 500 mg via ORAL
  Filled 2023-12-09: qty 1

## 2023-12-09 MED ORDER — DOXYCYCLINE HYCLATE 100 MG PO TABS
100.0000 mg | ORAL_TABLET | Freq: Once | ORAL | Status: AC
  Administered 2023-12-09: 100 mg via ORAL
  Filled 2023-12-09: qty 1

## 2023-12-09 NOTE — Discharge Instructions (Signed)
 You have been seen and discharged from the emergency department.  Your workup is showing findings of pneumonia.  You have been placed on oral antibiotics.  If your symptoms are not improved in the next week you need to return for further evaluation and imaging.  You declined a CT study with contrast to rule out pulmonary embolism.    Follow-up with your primary provider for further evaluation and further care. Take home medications as prescribed. If you have any worsening symptoms or further concerns for your health please return to an emergency department for further evaluation.

## 2023-12-09 NOTE — ED Triage Notes (Signed)
 Patient reports right flank pain  that started on Friday but has got worse.  Reports also short of breath with ambulation and thought she just needed a fluid pill but it ihas not helped.

## 2023-12-09 NOTE — ED Provider Notes (Signed)
 [place] EMERGENCY DEPARTMENT AT [facility] Provider Note   CSN: [id number] Arrival date & time: 12/09/23  1251     Patient presents with: Shortness of Breath and Flank Pain   [name] is a 53 y.o. female.  {Add pertinent medical, surgical, social history, OB history to YEP:67052}  Shortness of Breath Flank Pain Associated symptoms include shortness of breath.     Patient has a history of diabetes elevated BMI, depression, acute kidney disease.  Patient states she has been having some pain in her lower back over the last few days.  She thought initially it was her sciatica.  Patient states however she noticed in the last couple days she has been getting short of breath especially with exertion and activity.  Patient denies any pain in her chest.  She has not noticed any increased swelling.  She is not having any fevers or chills.  She denies abdominal pain  Prior to Admission medications   Medication Sig Start Date End Date Taking? Authorizing Provider  albuterol  (VENTOLIN  HFA) 108 (90 Base) MCG/ACT inhaler Inhale 2 puffs into the lungs every 6 (six) hours as needed for wheezing or shortness of breath.  06/15/15 08/31/22  [provider]  Continuous Blood Gluc Sensor (FREESTYLE LIBRE 2 SENSOR) MISC 1 Device by Does not apply route every 14 (fourteen) days. 04/17/21   Shamleffer, Ibtehal Jaralla, MD  Continuous Blood Gluc Transmit (DEXCOM G6 TRANSMITTER) MISC 1 Device by Does not apply route as directed. Patient not taking: Reported on 04/17/2021 04/06/20   Shamleffer, Ibtehal Jaralla, MD  gabapentin (NEURONTIN) 300 MG capsule Take 300 mg by mouth 2 (two) times daily. 01/21/21   [provider]  HYDROcodone -acetaminophen  (NORCO/VICODIN) 5-325 MG tablet Take 1 tablet by mouth every 4 (four) hours as needed for moderate pain (pain score 4-6). 08/02/23   Haze Lonni PARAS, MD  ibuprofen  (ADVIL ) 600 MG tablet Take 1 tablet (600 mg total) by mouth every 6  (six) hours as needed. 08/02/23   Haze Lonni PARAS, MD  insulin  glargine, 2 Unit Dial , (TOUJEO  MAX SOLOSTAR) 300 UNIT/ML Solostar Pen Inject 45 Units into the skin at bedtime.    [provider]  insulin  lispro (HUMALOG  KWIKPEN) 100 UNIT/ML KwikPen Max daily 70 units Patient taking differently: Inject 12 Units into the skin 3 (three) times daily as needed (high blood sugar). Max daily 70 units 04/17/21   Shamleffer, Donell Cardinal, MD  Insulin  Pen Needle (B-D UF III MINI PEN NEEDLES) 31G X 5 MM MISC 1 Device by Does not apply route in the morning, at noon, in the evening, and at bedtime. 04/17/21   Shamleffer, Ibtehal Jaralla, MD  metFORMIN  (GLUCOPHAGE ) 500 MG tablet Take 2 tablets (1,000 mg total) by mouth 2 (two) times daily with a meal. 04/17/21   Shamleffer, Donell Cardinal, MD  methocarbamol  (ROBAXIN ) 500 MG tablet Take 1 tablet (500 mg total) by mouth every 8 (eight) hours as needed for muscle spasms. 08/02/23   Haze Lonni PARAS, MD  norethindrone  (AYGESTIN ) 5 MG tablet Take 1 tablet (5 mg total) by mouth daily for 7 days. 09/04/22 09/11/22  Cherlyn Labella, MD  omeprazole  (PRILOSEC) 20 MG capsule Take 1 capsule (20 mg total) by mouth in the morning and at bedtime. 09/03/22   Akula, Vijaya, MD  ondansetron  (ZOFRAN ) 4 MG tablet Take 1 tablet (4 mg total) by mouth every 6 (six) hours as needed for nausea. 09/03/22   Akula, Vijaya, MD  sertraline  (ZOLOFT ) 50 MG tablet  Take 150 mg by mouth daily.    [provider]    Allergies: Codeine, Atorvastatin, Iodinated contrast media, and Metrizamide    Review of Systems  Respiratory:  Positive for shortness of breath.   Genitourinary:  Positive for flank pain.    Updated Vital Signs BP (!) 141/56 (BP Location: Right Arm)   Pulse 81   Temp 98.5 F (36.9 C) (Oral)   Resp (!) 26   Ht 1.702 m (5' 7)   Wt (!) 142.9 kg   LMP 06/16/2019   SpO2 100%   BMI 49.34 kg/m   Physical Exam Vitals and nursing note reviewed.   Constitutional:      General: She is not in acute distress.    Appearance: She is well-developed.  HENT:     Head: Normocephalic and atraumatic.     Right Ear: External ear normal.     Left Ear: External ear normal.  Eyes:     General: No scleral icterus.       Right eye: No discharge.        Left eye: No discharge.     Conjunctiva/sclera: Conjunctivae normal.  Neck:     Trachea: No tracheal deviation.  Cardiovascular:     Rate and Rhythm: Normal rate and regular rhythm.  Pulmonary:     Effort: Pulmonary effort is normal. No respiratory distress.     Breath sounds: Normal breath sounds. No stridor. No wheezing or rales.  Abdominal:     General: Bowel sounds are normal. There is no distension.     Palpations: Abdomen is soft.     Tenderness: There is no abdominal tenderness. There is no guarding or rebound.  Musculoskeletal:        General: No tenderness or deformity.     Cervical back: Neck supple.     Comments: Palpation paraspinal of lower thoracic upper lumbar spine  Skin:    General: Skin is warm and dry.     Findings: No rash.  Neurological:     General: No focal deficit present.     Mental Status: She is alert.     Cranial Nerves: No cranial nerve deficit, dysarthria or facial asymmetry.     Sensory: No sensory deficit.     Motor: No abnormal muscle tone or seizure activity.     Coordination: Coordination normal.  Psychiatric:        Mood and Affect: Mood normal.     (all labs ordered are listed, but only abnormal results are displayed) Labs Reviewed  COMPREHENSIVE METABOLIC PANEL WITH GFR  LIPASE, BLOOD  CBC WITH DIFFERENTIAL/PLATELET  URINALYSIS, ROUTINE W REFLEX MICROSCOPIC  D-DIMER, QUANTITATIVE  TROPONIN T, HIGH SENSITIVITY    EKG: None  Radiology: No results found.  {Document cardiac monitor, telemetry assessment procedure when appropriate:32947} Procedures   Medications Ordered in the ED  acetaminophen  (TYLENOL ) tablet 650 mg (has no  administration in time range)      {Click here for ABCD2, HEART and other calculators REFRESH Note before signing:1}                              Medical Decision Making Amount and/or Complexity of Data Reviewed Labs: ordered.  Risk OTC drugs.   ***  {Document critical care time when appropriate  Document review of labs and clinical decision tools ie CHADS2VASC2, etc  Document your independent review of radiology images and any outside records  Document your  discussion with family members, caretakers and with consultants  Document social determinants of health affecting pt's care  Document your decision making why or why not admission, treatments were needed:32947:::1}   Final diagnoses:  None    ED Discharge Orders     None

## 2023-12-09 NOTE — ED Provider Notes (Signed)
 Patient signed out to me by previous provider. Please refer to their note for full HPI.  Briefly this is a 53 year old female who presented with exertional shortness of breath, right-sided flank pain.  Vitals are stable.  EKG is unchanged for the patient.  Patient's pain is right upper back/flank.  Blood work is reassuring, kidney function is normal, urinalysis shows no blood or findings of infection/kidney stone.  Troponin is negative, BNP is negative, D-dimer was elevated.   Patient signed out pending plan for CT imaging, CT PE and rule out pulmonary embolism.   ED Course: Upon review of the patient's chart she has an allergy to CT contrast.  Listed as kidney failure which would require dialysis.  This was also in the setting of her being admitted for sepsis.  I doubt that this was the direct cause of her kidney failure.  However after discussing with the patient she is fearful and does not want CT imaging with contrast secondary to her experience of kidney failure/dialysis.  She understands the limitation of ruling out pulmonary embolism at this time.  In regards to the back pain/flank pain still pursued CT imaging without of the chest/abdomen and pelvis with recon of the spine.  CT shows no findings of kidney stone, L-spine is unremarkable.  Remainder of the abdomen does not show any acute findings.  CT of the chest shows areas of multifocal opacities in the right lung, does not seem consistent with possible lung infarct.  Upon reviewing the patient's symptoms she now endorses productive cough, she has a cup at bedside and she is spitting mucus in.  Denies any documented fever but endorses mild chills.  Discussed with the patient the most likely diagnosis of pneumonia.  Discussed with her treatment with oral antibiotics and outpatient follow-up.  At this time I have lower suspicion for PE with these CT findings consistent mostly with infection.  There is no tachycardia, hypoxia or tachypnea.  No EKG  findings consistent with PE.  No history of DVT or hemoptysis.  I talked with her alternatively about the main way to rule out pulmonary embolism being admission, VQ scan.  At this time she opts for treatment of presumed pneumonia and outpatient follow-up.  She understands if she is not improving in 5 to 7 days that she needs to return for further evaluation and rule out of PE.    Patient at this time appears safe and stable for discharge and close outpatient follow up. Discharge plan and strict return to ED precautions discussed, patient verbalizes understanding and agreement.   Bari Roxie HERO, DO 12/09/23 2035

## 2024-01-16 ENCOUNTER — Emergency Department (HOSPITAL_COMMUNITY): Payer: Self-pay

## 2024-01-16 ENCOUNTER — Encounter (HOSPITAL_COMMUNITY): Payer: Self-pay

## 2024-01-16 ENCOUNTER — Other Ambulatory Visit: Payer: Self-pay

## 2024-01-16 ENCOUNTER — Emergency Department (HOSPITAL_COMMUNITY)
Admission: EM | Admit: 2024-01-16 | Discharge: 2024-01-17 | Disposition: A | Payer: Self-pay | Attending: Emergency Medicine | Admitting: Emergency Medicine

## 2024-01-16 DIAGNOSIS — E119 Type 2 diabetes mellitus without complications: Secondary | ICD-10-CM | POA: Diagnosis not present

## 2024-01-16 DIAGNOSIS — R059 Cough, unspecified: Secondary | ICD-10-CM | POA: Diagnosis not present

## 2024-01-16 DIAGNOSIS — Z794 Long term (current) use of insulin: Secondary | ICD-10-CM | POA: Insufficient documentation

## 2024-01-16 DIAGNOSIS — R0600 Dyspnea, unspecified: Secondary | ICD-10-CM | POA: Insufficient documentation

## 2024-01-16 DIAGNOSIS — Z5329 Procedure and treatment not carried out because of patient's decision for other reasons: Secondary | ICD-10-CM | POA: Insufficient documentation

## 2024-01-16 DIAGNOSIS — Z7984 Long term (current) use of oral hypoglycemic drugs: Secondary | ICD-10-CM | POA: Insufficient documentation

## 2024-01-16 DIAGNOSIS — R030 Elevated blood-pressure reading, without diagnosis of hypertension: Secondary | ICD-10-CM | POA: Diagnosis not present

## 2024-01-16 DIAGNOSIS — R0602 Shortness of breath: Secondary | ICD-10-CM | POA: Diagnosis present

## 2024-01-16 LAB — BASIC METABOLIC PANEL WITH GFR
Anion gap: 8 (ref 5–15)
BUN: 12 mg/dL (ref 6–20)
CO2: 26 mmol/L (ref 22–32)
Calcium: 9 mg/dL (ref 8.9–10.3)
Chloride: 101 mmol/L (ref 98–111)
Creatinine, Ser: 0.67 mg/dL (ref 0.44–1.00)
GFR, Estimated: >60 mL/min (ref >60)
Glucose, Bld: 270 mg/dL — ABNORMAL HIGH (ref 70–99)
Potassium: 3.9 mmol/L (ref 3.5–5.1)
Sodium: 135 mmol/L (ref 135–145)

## 2024-01-16 LAB — CBC
HCT: 31.9 % — ABNORMAL LOW (ref 36.0–46.0)
Hemoglobin: 11.1 g/dL — ABNORMAL LOW (ref 12.0–15.0)
MCH: 29.3 pg (ref 26.0–34.0)
MCHC: 34.8 g/dL (ref 30.0–36.0)
MCV: 84.2 fL (ref 80.0–100.0)
Platelets: 232 K/uL (ref 150–400)
RBC: 3.79 MIL/uL — ABNORMAL LOW (ref 3.87–5.11)
RDW: 13.1 % (ref 11.5–15.5)
WBC: 4.8 K/uL (ref 4.0–10.5)
nRBC: 0 % (ref 0.0–0.2)

## 2024-01-16 NOTE — ED Triage Notes (Signed)
 Pt reports with increased shob when ambulating. Pt states that she recently had pna.

## 2024-01-17 LAB — RESP PANEL BY RT-PCR (RSV, FLU A&B, COVID)  RVPGX2
Influenza A by PCR: NEGATIVE
Influenza B by PCR: NEGATIVE
Resp Syncytial Virus by PCR: NEGATIVE
SARS Coronavirus 2 by RT PCR: NEGATIVE

## 2024-01-17 LAB — TROPONIN T, HIGH SENSITIVITY: Troponin T High Sensitivity: <15 ng/L (ref 0–19)

## 2024-01-17 NOTE — ED Notes (Signed)
 Pt expressed desire to leave before workup complete, MD made aware. Pt left department.

## 2024-01-17 NOTE — ED Provider Notes (Signed)
 [place] EMERGENCY DEPARTMENT AT [facility] Provider Note   CSN: [id number] Arrival date & time: 01/16/24  8158     Patient presents with: Shortness of Breath   [name] is a 53 y.o. female.   The history is provided by the patient.  Shortness of Breath Severity:  Moderate Onset quality:  Gradual Duration: days. Timing:  Constant Progression:  Worsening Chronicity:  New Context: not URI   Relieved by:  Nothing Worsened by:  Nothing Ineffective treatments:  None tried Associated symptoms: no chest pain, no claudication, no cough, no diaphoresis, no ear pain, no syncope, no swollen glands, no vomiting and no wheezing   Risk factors: no recent alcohol use   Patient with DM and ARF presents with cough with bad taste and feels SOB with walking long distances    Past Medical History:  Diagnosis Date   Acute renal failure (ARF)    Allergy    Depression    Diabetes mellitus    Morbid obesity (HCC)      Prior to Admission medications  Medication Sig Start Date End Date Taking? Authorizing Provider  albuterol  (VENTOLIN  HFA) 108 (90 Base) MCG/ACT inhaler Inhale 2 puffs into the lungs every 6 (six) hours as needed for wheezing or shortness of breath.  06/15/15 08/31/22  [provider]  Continuous Blood Gluc Sensor (FREESTYLE LIBRE 2 SENSOR) MISC 1 Device by Does not apply route every 14 (fourteen) days. 04/17/21   Shamleffer, Ibtehal Jaralla, MD  Continuous Blood Gluc Transmit (DEXCOM G6 TRANSMITTER) MISC 1 Device by Does not apply route as directed. Patient not taking: Reported on 04/17/2021 04/06/20   Shamleffer, Ibtehal Jaralla, MD  doxycycline  (VIBRAMYCIN ) 100 MG capsule Take 1 capsule (100 mg total) by mouth 2 (two) times daily. 12/09/23   Horton, Kristie M, DO  gabapentin (NEURONTIN) 300 MG capsule Take 300 mg by mouth 2 (two) times daily. 01/21/21   [provider]  HYDROcodone -acetaminophen  (NORCO/VICODIN) 5-325 MG tablet Take 1 tablet by  mouth every 4 (four) hours as needed for moderate pain (pain score 4-6). 08/02/23   Haze Lonni PARAS, MD  ibuprofen  (ADVIL ) 600 MG tablet Take 1 tablet (600 mg total) by mouth every 6 (six) hours as needed. 08/02/23   Haze Lonni PARAS, MD  insulin  glargine, 2 Unit Dial , (TOUJEO  MAX SOLOSTAR) 300 UNIT/ML Solostar Pen Inject 45 Units into the skin at bedtime.    [provider]  insulin  lispro (HUMALOG  KWIKPEN) 100 UNIT/ML KwikPen Max daily 70 units Patient taking differently: Inject 12 Units into the skin 3 (three) times daily as needed (high blood sugar). Max daily 70 units 04/17/21   Shamleffer, Ibtehal Jaralla, MD  Insulin  Pen Needle (B-D UF III MINI PEN NEEDLES) 31G X 5 MM MISC 1 Device by Does not apply route in the morning, at noon, in the evening, and at bedtime. 04/17/21   Shamleffer, Ibtehal Jaralla, MD  metFORMIN  (GLUCOPHAGE ) 500 MG tablet Take 2 tablets (1,000 mg total) by mouth 2 (two) times daily with a meal. 04/17/21   Shamleffer, Donell Cardinal, MD  methocarbamol  (ROBAXIN ) 500 MG tablet Take 1 tablet (500 mg total) by mouth 2 (two) times daily. 12/09/23   Horton, Roxie HERO, DO  norethindrone  (AYGESTIN ) 5 MG tablet Take 1 tablet (5 mg total) by mouth daily for 7 days. 09/04/22 09/11/22  Cherlyn Labella, MD  omeprazole  (PRILOSEC) 20 MG capsule Take 1 capsule (20 mg total) by mouth in the morning and at bedtime. 09/03/22  Cherlyn Labella, MD  ondansetron  (ZOFRAN ) 4 MG tablet Take 1 tablet (4 mg total) by mouth every 6 (six) hours as needed for nausea. 09/03/22   Akula, Vijaya, MD  sertraline  (ZOLOFT ) 50 MG tablet Take 150 mg by mouth daily.    [provider]    Allergies: Codeine, Atorvastatin, Iodinated contrast media, and Metrizamide    Review of Systems  Constitutional:  Negative for diaphoresis.  HENT:  Positive for congestion. Negative for ear pain.   Respiratory:  Positive for shortness of breath. Negative for cough and wheezing.   Cardiovascular:  Negative for  chest pain, palpitations, claudication, leg swelling and syncope.  Gastrointestinal:  Negative for vomiting.  All other systems reviewed and are negative.   Updated Vital Signs BP (!) 165/95   Pulse 64   Temp 98.1 F (36.7 C) (Oral)   Resp 18   LMP 06/16/2019   SpO2 95%   Physical Exam Vitals and nursing note reviewed.  Constitutional:      General: She is not in acute distress.    Appearance: Normal appearance. She is well-developed.  HENT:     Head: Normocephalic and atraumatic.     Nose: Nose normal.  Eyes:     Pupils: Pupils are equal, round, and reactive to light.  Cardiovascular:     Rate and Rhythm: Normal rate and regular rhythm.     Pulses: Normal pulses.     Heart sounds: Normal heart sounds.  Pulmonary:     Effort: Pulmonary effort is normal. No respiratory distress.     Breath sounds: Normal breath sounds. No stridor. No wheezing, rhonchi or rales.  Chest:     Chest wall: No tenderness.  Abdominal:     General: Bowel sounds are normal. There is no distension.     Palpations: Abdomen is soft.     Tenderness: There is no abdominal tenderness. There is no guarding or rebound.  Musculoskeletal:        General: Normal range of motion.     Cervical back: Normal range of motion and neck supple.  Skin:    General: Skin is warm and dry.     Capillary Refill: Capillary refill takes less than 2 seconds.     Findings: No erythema or rash.  Neurological:     General: No focal deficit present.     Mental Status: She is alert.     Deep Tendon Reflexes: Reflexes normal.  Psychiatric:        Mood and Affect: Mood normal.     (all labs ordered are listed, but only abnormal results are displayed) Results for orders placed or performed during the hospital encounter of 01/16/24  Resp panel by RT-PCR (RSV, Flu A&B, Covid) Anterior Nasal Swab   Collection Time: 01/16/24 12:21 AM   Specimen: Anterior Nasal Swab  Result Value Ref Range   SARS Coronavirus 2 by RT PCR  NEGATIVE NEGATIVE   Influenza A by PCR NEGATIVE NEGATIVE   Influenza B by PCR NEGATIVE NEGATIVE   Resp Syncytial Virus by PCR NEGATIVE NEGATIVE  Basic metabolic panel   Collection Time: 01/16/24  8:30 PM  Result Value Ref Range   Sodium 135 135 - 145 mmol/L   Potassium 3.9 3.5 - 5.1 mmol/L   Chloride 101 98 - 111 mmol/L   CO2 26 22 - 32 mmol/L   Glucose, Bld 270 (H) 70 - 99 mg/dL   BUN 12 6 - 20 mg/dL   Creatinine, Ser 9.32 0.44 -  1.00 mg/dL   Calcium  9.0 8.9 - 10.3 mg/dL   GFR, Estimated >39 >39 mL/min   Anion gap 8 5 - 15  CBC   Collection Time: 01/16/24  8:30 PM  Result Value Ref Range   WBC 4.8 4.0 - 10.5 K/uL   RBC 3.79 (L) 3.87 - 5.11 MIL/uL   Hemoglobin 11.1 (L) 12.0 - 15.0 g/dL   HCT 68.0 (L) 63.9 - 53.9 %   MCV 84.2 80.0 - 100.0 fL   MCH 29.3 26.0 - 34.0 pg   MCHC 34.8 30.0 - 36.0 g/dL   RDW 86.8 88.4 - 84.4 %   Platelets 232 150 - 400 K/uL   nRBC 0.0 0.0 - 0.2 %  Troponin T, High Sensitivity   Collection Time: 01/16/24  8:30 PM  Result Value Ref Range   Troponin T High Sensitivity <15 0 - 19 ng/L   DG Chest 2 View Result Date: 01/16/2024 CLINICAL DATA:  Shortness of breath EXAM: CHEST - 2 VIEW COMPARISON:  12/09/2023 FINDINGS: The heart size and mediastinal contours are within normal limits. Both lungs are clear. The visualized skeletal structures are unremarkable. IMPRESSION: No active cardiopulmonary disease. Electronically Signed   By: Luke Bun M.D.   On: 01/16/2024 19:48    EKG:  EKG Interpretation Date/Time:  Friday January 16 2024 19:28:34 EST Ventricular Rate:  79 PR Interval:  179 QRS Duration:  86 QT Interval:  378 QTC Calculation: 434 R Axis:   7  Text Interpretation: Sinus rhythm Probable left ventricular hypertrophy Confirmed by Quanisha Drewry ([id number]) on 01/17/2024 3:46:15 AM         Radiology: [facility] Chest 2 View Result Date: 01/16/2024 CLINICAL DATA:  Shortness of breath EXAM: CHEST - 2 VIEW COMPARISON:  12/09/2023 FINDINGS: The  heart size and mediastinal contours are within normal limits. Both lungs are clear. The visualized skeletal structures are unremarkable. IMPRESSION: No active cardiopulmonary disease. Electronically Signed   By: Luke Bun M.D.   On: 01/16/2024 19:48     Procedures   Medications Ordered in the ED - No data to display                                  Medical Decision Making Patient with cough and SOB with ambulation and elevated BP  Amount and/or Complexity of Data Reviewed External Data Reviewed: notes.    Details: Previous notes reviewed  Labs: ordered.    Details: Negative troponin < 15, negative covid and flu.  Normal sodium 135, normal potassium 3.9, normal creatinine normal white count 4.8, low hemoglobin 11.1, normal platelets  Radiology: ordered and independent interpretation performed.    Details: Normal CXR  Risk Risk Details: Awaiting delta troponin and Plan was to observe and obtain VQ scan in am for exclusion of PE given contrast allergy      Final diagnoses:  Dyspnea, unspecified type  Elevated blood pressure reading   Eloped  ED Discharge Orders     None          Trea Latner, MD 01/17/24 [phone]

## 2024-01-26 ENCOUNTER — Other Ambulatory Visit (HOSPITAL_BASED_OUTPATIENT_CLINIC_OR_DEPARTMENT_OTHER): Payer: Self-pay | Admitting: Family Medicine

## 2024-01-26 DIAGNOSIS — Z1231 Encounter for screening mammogram for malignant neoplasm of breast: Secondary | ICD-10-CM

## 2024-01-27 ENCOUNTER — Inpatient Hospital Stay (HOSPITAL_BASED_OUTPATIENT_CLINIC_OR_DEPARTMENT_OTHER): Admission: RE | Admit: 2024-01-27 | Payer: PRIVATE HEALTH INSURANCE | Admitting: Radiology

## 2024-01-27 DIAGNOSIS — Z1231 Encounter for screening mammogram for malignant neoplasm of breast: Secondary | ICD-10-CM
